# Patient Record
Sex: Female | Born: 1965
Health system: Southern US, Community
[De-identification: ages and names within clinical notes are randomized; demographics above are authoritative.]

## PROBLEM LIST (undated history)

## (undated) DIAGNOSIS — R569 Unspecified convulsions: Secondary | ICD-10-CM

## (undated) DIAGNOSIS — D649 Anemia, unspecified: Secondary | ICD-10-CM

## (undated) DIAGNOSIS — R002 Palpitations: Secondary | ICD-10-CM

## (undated) DIAGNOSIS — R519 Headache, unspecified: Secondary | ICD-10-CM

## (undated) DIAGNOSIS — R079 Chest pain, unspecified: Secondary | ICD-10-CM

## (undated) DIAGNOSIS — R0609 Other forms of dyspnea: Secondary | ICD-10-CM

## (undated) DIAGNOSIS — M1990 Unspecified osteoarthritis, unspecified site: Secondary | ICD-10-CM

## (undated) DIAGNOSIS — Z9889 Other specified postprocedural states: Secondary | ICD-10-CM

## (undated) DIAGNOSIS — J40 Bronchitis, not specified as acute or chronic: Secondary | ICD-10-CM

## (undated) DIAGNOSIS — I5189 Other ill-defined heart diseases: Secondary | ICD-10-CM

## (undated) DIAGNOSIS — F419 Anxiety disorder, unspecified: Secondary | ICD-10-CM

## (undated) DIAGNOSIS — R112 Nausea with vomiting, unspecified: Secondary | ICD-10-CM

## (undated) DIAGNOSIS — T7840XA Allergy, unspecified, initial encounter: Secondary | ICD-10-CM

## (undated) DIAGNOSIS — F329 Major depressive disorder, single episode, unspecified: Secondary | ICD-10-CM

## (undated) DIAGNOSIS — R0600 Dyspnea, unspecified: Secondary | ICD-10-CM

## (undated) DIAGNOSIS — F32A Depression, unspecified: Secondary | ICD-10-CM

## (undated) DIAGNOSIS — C801 Malignant (primary) neoplasm, unspecified: Secondary | ICD-10-CM

## (undated) DIAGNOSIS — K219 Gastro-esophageal reflux disease without esophagitis: Secondary | ICD-10-CM

## (undated) DIAGNOSIS — I639 Cerebral infarction, unspecified: Secondary | ICD-10-CM

## (undated) DIAGNOSIS — I10 Essential (primary) hypertension: Secondary | ICD-10-CM

## (undated) DIAGNOSIS — J449 Chronic obstructive pulmonary disease, unspecified: Secondary | ICD-10-CM

## (undated) DIAGNOSIS — R51 Headache: Secondary | ICD-10-CM

## (undated) DIAGNOSIS — E785 Hyperlipidemia, unspecified: Secondary | ICD-10-CM

## (undated) DIAGNOSIS — M797 Fibromyalgia: Secondary | ICD-10-CM

## (undated) HISTORY — PX: NOSE SURGERY: SHX723

## (undated) HISTORY — PX: ANTERIOR CRUCIATE LIGAMENT REPAIR: SHX115

## (undated) HISTORY — PX: TUBAL LIGATION: SHX77

## (undated) HISTORY — DX: Chest pain, unspecified: R07.9

## (undated) HISTORY — DX: Cerebral infarction, unspecified: I63.9

## (undated) HISTORY — DX: Dyspnea, unspecified: R06.00

## (undated) HISTORY — DX: Unspecified osteoarthritis, unspecified site: M19.90

## (undated) HISTORY — PX: CHOLECYSTECTOMY: SHX55

## (undated) HISTORY — PX: KNEE SURGERY: SHX244

## (undated) HISTORY — DX: Allergy, unspecified, initial encounter: T78.40XA

## (undated) HISTORY — PX: POLYPECTOMY: SHX149

## (undated) HISTORY — DX: Malignant (primary) neoplasm, unspecified: C80.1

## (undated) HISTORY — PX: CARDIAC CATHETERIZATION: SHX172

## (undated) HISTORY — DX: Palpitations: R00.2

## (undated) HISTORY — DX: Other ill-defined heart diseases: I51.89

## (undated) HISTORY — DX: Other forms of dyspnea: R06.09

## (undated) HISTORY — DX: Unspecified convulsions: R56.9

## (undated) HISTORY — PX: CORONARY ANGIOPLASTY: SHX604

## (undated) HISTORY — DX: Hyperlipidemia, unspecified: E78.5

---

## 2003-11-21 ENCOUNTER — Other Ambulatory Visit: Payer: Self-pay

## 2005-01-09 ENCOUNTER — Emergency Department: Payer: Self-pay | Admitting: Emergency Medicine

## 2005-03-19 ENCOUNTER — Ambulatory Visit: Payer: Self-pay | Admitting: Otolaryngology

## 2006-05-22 ENCOUNTER — Ambulatory Visit: Payer: Self-pay

## 2007-06-19 ENCOUNTER — Inpatient Hospital Stay: Payer: Self-pay | Admitting: General Surgery

## 2009-06-30 ENCOUNTER — Emergency Department: Payer: Self-pay | Admitting: Emergency Medicine

## 2009-10-20 ENCOUNTER — Emergency Department: Payer: Self-pay | Admitting: Unknown Physician Specialty

## 2010-02-01 ENCOUNTER — Ambulatory Visit: Payer: Self-pay | Admitting: Unknown Physician Specialty

## 2010-02-07 ENCOUNTER — Ambulatory Visit: Payer: Self-pay | Admitting: Unknown Physician Specialty

## 2010-12-05 ENCOUNTER — Emergency Department: Payer: Self-pay | Admitting: Emergency Medicine

## 2010-12-31 ENCOUNTER — Emergency Department: Payer: Self-pay | Admitting: Emergency Medicine

## 2011-09-03 ENCOUNTER — Emergency Department: Payer: Self-pay | Admitting: Emergency Medicine

## 2012-11-21 ENCOUNTER — Emergency Department: Payer: Self-pay | Admitting: Emergency Medicine

## 2013-06-19 ENCOUNTER — Emergency Department: Payer: Self-pay | Admitting: Emergency Medicine

## 2013-07-30 ENCOUNTER — Ambulatory Visit: Payer: Self-pay | Admitting: Internal Medicine

## 2013-11-07 ENCOUNTER — Emergency Department: Payer: Self-pay | Admitting: Emergency Medicine

## 2013-11-07 LAB — CBC
HGB: 14 g/dL (ref 12.0–16.0)
MCHC: 33.8 g/dL (ref 32.0–36.0)
MCV: 91 fL (ref 80–100)
RBC: 4.56 10*6/uL (ref 3.80–5.20)
RDW: 13.1 % (ref 11.5–14.5)

## 2013-11-07 LAB — TROPONIN I: Troponin-I: <0.02 ng/mL

## 2013-11-07 LAB — BASIC METABOLIC PANEL
Chloride: 105 mmol/L (ref 98–107)
EGFR (African American): >60
EGFR (Non-African Amer.): >60
Glucose: 105 mg/dL — ABNORMAL HIGH (ref 65–99)
Potassium: 3.8 mmol/L (ref 3.5–5.1)
Sodium: 138 mmol/L (ref 136–145)

## 2013-11-07 LAB — URINALYSIS, COMPLETE
Blood: NEGATIVE
Glucose,UR: NEGATIVE mg/dL (ref 0–75)
Ketone: NEGATIVE
Nitrite: NEGATIVE
Ph: 5 (ref 4.5–8.0)
Protein: NEGATIVE
Specific Gravity: 1.018 (ref 1.003–1.030)

## 2013-11-07 LAB — PRO B NATRIURETIC PEPTIDE: B-Type Natriuretic Peptide: 54 pg/mL (ref 0–125)

## 2014-01-07 ENCOUNTER — Emergency Department: Payer: Self-pay | Admitting: Emergency Medicine

## 2014-03-05 ENCOUNTER — Emergency Department: Payer: Self-pay | Admitting: Emergency Medicine

## 2014-05-12 DIAGNOSIS — F32A Depression, unspecified: Secondary | ICD-10-CM | POA: Insufficient documentation

## 2014-05-12 DIAGNOSIS — M545 Low back pain, unspecified: Secondary | ICD-10-CM | POA: Insufficient documentation

## 2014-05-12 DIAGNOSIS — G894 Chronic pain syndrome: Secondary | ICD-10-CM | POA: Insufficient documentation

## 2015-01-23 ENCOUNTER — Ambulatory Visit: Payer: Self-pay | Admitting: Family Medicine

## 2015-02-06 LAB — CBC
HCT: 39.4 % (ref 35.0–47.0)
HGB: 13 g/dL (ref 12.0–16.0)
MCH: 30 pg (ref 26.0–34.0)
MCHC: 33 g/dL (ref 32.0–36.0)
MCV: 91 fL (ref 80–100)
Platelet: 221 10*3/uL (ref 150–440)
RBC: 4.34 10*6/uL (ref 3.80–5.20)
RDW: 14 % (ref 11.5–14.5)
WBC: 11.2 10*3/uL — AB (ref 3.6–11.0)

## 2015-02-06 LAB — BASIC METABOLIC PANEL
Anion Gap: 11 (ref 7–16)
BUN: 14 mg/dL
CREATININE: 0.73 mg/dL
Calcium, Total: 8.9 mg/dL
Chloride: 103 mmol/L
Co2: 22 mmol/L
EGFR (African American): >60
EGFR (Non-African Amer.): >60
GLUCOSE: 136 mg/dL — AB
Potassium: 3.1 mmol/L — ABNORMAL LOW
Sodium: 136 mmol/L

## 2015-02-06 LAB — PRO B NATRIURETIC PEPTIDE: B-Type Natriuretic Peptide: 10 pg/mL

## 2015-02-06 LAB — D-DIMER(ARMC): D-Dimer: 1463 ng/ml

## 2015-02-06 LAB — TROPONIN I

## 2015-02-07 ENCOUNTER — Observation Stay
Admit: 2015-02-07 | Disposition: A | Discharge status: Home / Self Care | Payer: Self-pay | Attending: Internal Medicine | Admitting: Internal Medicine

## 2015-02-07 LAB — URINALYSIS, COMPLETE
BACTERIA: NONE SEEN
RBC,UR: 3406 /HPF (ref 0–5)
Specific Gravity: 1.022 (ref 1.003–1.030)
WBC UR: 98 /HPF (ref 0–5)

## 2015-02-07 LAB — DRUG SCREEN, URINE
Amphetamines, Ur Screen: NEGATIVE
Barbiturates, Ur Screen: NEGATIVE
Benzodiazepine, Ur Scrn: POSITIVE
CANNABINOID 50 NG, UR ~~LOC~~: NEGATIVE
Cocaine Metabolite,Ur ~~LOC~~: NEGATIVE
MDMA (Ecstasy)Ur Screen: NEGATIVE
METHADONE, UR SCREEN: NEGATIVE
OPIATE, UR SCREEN: NEGATIVE
PHENCYCLIDINE (PCP) UR S: NEGATIVE
Tricyclic, Ur Screen: POSITIVE

## 2015-02-12 LAB — CULTURE, BLOOD (SINGLE)

## 2015-02-16 ENCOUNTER — Institutional Professional Consult (permissible substitution): Payer: Self-pay | Admitting: Internal Medicine

## 2015-02-16 ENCOUNTER — Encounter: Payer: Self-pay | Admitting: *Deleted

## 2015-02-22 ENCOUNTER — Ambulatory Visit (INDEPENDENT_AMBULATORY_CARE_PROVIDER_SITE_OTHER): Payer: No Typology Code available for payment source | Admitting: Internal Medicine

## 2015-02-22 ENCOUNTER — Encounter: Payer: Self-pay | Admitting: Internal Medicine

## 2015-02-22 VITALS — BP 122/84 | HR 94 | Ht 62.0 in | Wt 191.0 lb

## 2015-02-22 DIAGNOSIS — R0602 Shortness of breath: Secondary | ICD-10-CM | POA: Insufficient documentation

## 2015-02-22 DIAGNOSIS — R0600 Dyspnea, unspecified: Secondary | ICD-10-CM

## 2015-02-22 DIAGNOSIS — R059 Cough, unspecified: Secondary | ICD-10-CM | POA: Insufficient documentation

## 2015-02-22 DIAGNOSIS — R05 Cough: Secondary | ICD-10-CM

## 2015-02-22 DIAGNOSIS — J069 Acute upper respiratory infection, unspecified: Secondary | ICD-10-CM | POA: Diagnosis not present

## 2015-02-22 MED ORDER — AMOXICILLIN-POT CLAVULANATE 875-125 MG PO TABS: 1.0000 | ORAL_TABLET | Freq: Two times a day (BID) | ORAL | Status: AC
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
## 2015-02-22 NOTE — Assessment & Plan Note (Addendum)
Multifactorial: Obesity, postinfectious, obstructive versus restrictive disease, possible asthma versus COPD, another upper respiratory tract infection, possible bronchospasms, possible allergies  Plan: -See plan for dyspnea -See plan for upper respiratory tract infection. -Flonase 2 sprays to each nostril twice a day 1 week, then only as needed for postnasal drip, runny nose, nasal congestion.

## 2015-02-22 NOTE — Patient Instructions (Addendum)
Follow up with Dr. Stevenson Clinch in 2 months - pulmonary function testing and 6 minute walk test prior to follow up - Augmentin 875mg  - 1 tab by mouth twice a day x 7 days.  - stop spiriva  - continue with Advair for now until follow up - gargle and rinse after each use - Flonase - 2 sprays to each nostril daily for 7 days

## 2015-02-22 NOTE — Assessment & Plan Note (Addendum)
Multifactorial: Recent infection, obesity, obstructive versus restrictive disease, deconditioning, another upper respiratory tract infection, possible asthma versus COPD  Patient's dyspnea is very subjective, O2 saturation in the office was noted to be at 94%. She does have some generalized upper extremity and lower extremity swelling, which is nonpitting. Her BNP level of the hospitalization was less than 100. I believe that her dyspnea is subjectively graded, however pulmonary function testing along with 6 minute walk testing and further evaluation by cardiology may give a better idea as to etiology for her dyspnea.  I do not believe the patient has COPD, at least clinically speaking. Prior to hospitalization and prior to December 2015 she is a never smoker, although she has significant secondhand smoke exposure, but no recurrent ED visits or urgent care visits for upper spray tract infection or seasonal allergies or morning cough. Call most recent CT chest did not show any pulmonary embolism, and I could not appreciate any frank emphysema or airway disease on this low quality CAT scan. There was some mild peribronchial thickening on her recent CAT scan, however this could've been due to recent infection.  Plan: -Pulmonary function testing and 6 minute walk test prior to follow-up visit -Stop Spiriva, continue with Advair as needed albuterol. -Will follow up with cardiology evaluation for tachycardia and dyspnea, possible echocardiogram -It had follow-up patient continues to have dyspnea and cough may need to consider a high-resolution CAT scan at that time

## 2015-02-22 NOTE — Assessment & Plan Note (Addendum)
Cough with congestion and nasal drainage. Cough with productive sputum - brownish  Plan - Augmentin 875mg  - 1 tab by mouth twice a day x 7 days.  - Flonase - 2 sprays to each nostril daily for 7 days

## 2015-03-03 ENCOUNTER — Encounter: Payer: Self-pay | Admitting: Cardiovascular Disease

## 2015-03-06 ENCOUNTER — Inpatient Hospital Stay: Payer: Self-pay | Admitting: Internal Medicine
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
## 2015-03-21 ENCOUNTER — Ambulatory Visit (INDEPENDENT_AMBULATORY_CARE_PROVIDER_SITE_OTHER): Payer: No Typology Code available for payment source | Admitting: Cardiovascular Disease

## 2015-03-21 ENCOUNTER — Encounter: Payer: Self-pay | Admitting: Cardiovascular Disease

## 2015-03-21 VITALS — BP 112/72 | HR 67 | Ht 62.0 in | Wt 190.5 lb

## 2015-03-21 DIAGNOSIS — J4540 Moderate persistent asthma, uncomplicated: Secondary | ICD-10-CM

## 2015-03-21 DIAGNOSIS — R079 Chest pain, unspecified: Secondary | ICD-10-CM

## 2015-03-21 DIAGNOSIS — R059 Cough, unspecified: Secondary | ICD-10-CM

## 2015-03-21 DIAGNOSIS — J069 Acute upper respiratory infection, unspecified: Secondary | ICD-10-CM

## 2015-03-21 DIAGNOSIS — R05 Cough: Secondary | ICD-10-CM

## 2015-03-21 DIAGNOSIS — J45909 Unspecified asthma, uncomplicated: Secondary | ICD-10-CM | POA: Insufficient documentation

## 2015-03-21 DIAGNOSIS — R0600 Dyspnea, unspecified: Secondary | ICD-10-CM | POA: Diagnosis not present

## 2015-03-21 MED ORDER — FUROSEMIDE 20 MG PO TABS: 20.0000 mg | ORAL_TABLET | Freq: Two times a day (BID) | ORAL | Status: DC | PRN

## 2015-03-21 MED ORDER — POTASSIUM CHLORIDE ER 10 MEQ PO TBCR: 10.0000 meq | EXTENDED_RELEASE_TABLET | Freq: Every day | ORAL | Status: DC

## 2015-03-21 NOTE — Assessment & Plan Note (Signed)
Diffuse rhonchorous breath sounds bilaterally from apical regions down to the bases. No productive sputum. She has had 3 courses of antibiotics.  Given the severity of her symptoms and diffuse coarse breath sounds on exam, I suspect she might need a long prednisone taper. Unclear if she would benefit from a fourth course of antibiotics such as Levaquin for prolonged period of time. Both of these were discussed with her. No prescriptions called in at this time as she would like to try high-dose diuretics for several days

## 2015-03-21 NOTE — Assessment & Plan Note (Signed)
I think her presentation is probably secondary to underlying reactive airway disease. Very coarse breath sounds and bronchospastic cough on exam. Echocardiogram has been ordered to rule out pulmonary hypertension. She will try several days of high-dose diuretics with less fluid intake. I suspect arm and leg swelling is unrelated to a cardiac issue. If echo was essentially normal, and no significant improvement with higher dose diuretics, she may benefit from prolonged course of prednisone with slow taper

## 2015-03-21 NOTE — Patient Instructions (Addendum)
You are doing well.  Please try extra lasix daily for a few days with less fluid intake  Take with a potassium pill  We will order an echocardiogram for shortness of breath  I will talk with Dr. Stevenson Clinch about your lungs  Please call us if you have new issues that need to be addressed before your next appt.

## 2015-03-21 NOTE — Progress Notes (Signed)
Patient ID: Melinda Shaw, female    DOB: 26-Jul-1966, 49 y.o.   MRN: [id number]  HPI Comments: Melinda Shaw is a 49 year old woman with no prior smoking history though does have secondhand exposure to smoke, history of anxiety/depression with PTSD, fibromyalgia, obesity, Developing upper respiratory infection at the end of 2015, started on antibiotics January 2016 ( Augmentin per the patient), treated with another course of antibiotics as an outpatient, admitted to the hospital end of March 2016 with severe shortness of breath diagnosed with acute bronchitis, underlying COPD, tachycardia,  Treated with ceftin for 2 days while in the hospital plus additional 5 days at home. She was discharged from the hospital 02/08/2015. EKG in the hospital showed sinus tachycardia with rate 135 bpm, nonspecific ST abnormality She was started on diltiazem 120 mg daily while in the hospital for heart rate control Presenting with continued shortness of breath.  In follow-up today, she reports having chronic cough, nonproductive. Significant cough at nighttime that keeps her awake, improved with Robitussin with codeine. She does have swelling in her arms and legs and has been taking Lasix 20 mg daily. She's been on Lasix for at least one year She reports no significant improvement in her cough and symptoms over the past 4-5 months. She has significant shortness of breath with her ADLs. No dramatic improvement in the past month.   CT scan 02/06/2015 showing no PE, peribronchial thickening Review of her lab work with her shows low potassium 3.1 on 02/06/2015. She is normally not on potassium as an outpatient BNP of 10 Other recent lab work from primary care March 2016 shows total cholesterol 253, LDL 173, TSH 2.0   EKG on today's visit shows normal sinus rhythm with rate 67 bpm, no significant ST or T-wave changes    Allergies  Allergen Reactions  . Cherry Swelling    Swelling of lips and throat  . Sulfa  Antibiotics Shortness Of Breath    Stopped breathing, Swelling of throat  . Bee Venom     Swelling of throat    Current Outpatient Prescriptions on File Prior to Visit  Medication Sig Dispense Refill  . albuterol (PROVENTIL HFA;VENTOLIN HFA) 108 (90 BASE) MCG/ACT inhaler Inhale 2 puffs into the lungs every 6 (six) hours as needed for wheezing or shortness of breath.    . citalopram (CELEXA) 20 MG tablet Take 20 mg by mouth daily.    . clonazePAM (KLONOPIN) 1 MG tablet Take 1 mg by mouth 3 (three) times daily.    . Fluticasone-Salmeterol (ADVAIR) 250-50 MCG/DOSE AEPB Inhale 1 puff into the lungs 2 (two) times daily.    [name] gabapentin (NEURONTIN) 100 MG capsule Take 200 mg by mouth daily.    . QUEtiapine (SEROQUEL XR) 300 MG 24 hr tablet Take 300 mg by mouth at bedtime.    [name] tiotropium (SPIRIVA) 18 MCG inhalation capsule Place 18 mcg into inhaler and inhale daily.    [name] diltiazem (TIAZAC) 120 MG 24 hr capsule Take 120 mg by mouth daily.    [name] tiZANidine (ZANAFLEX) 4 MG tablet Take 4 mg by mouth every 8 (eight) hours as needed for muscle spasms.     No current facility-administered medications on file prior to visit.    Past Medical History  Diagnosis Date  . Asthma   . Hyperlipidemia     Past Surgical History  Procedure Laterality Date  . Anterior cruciate ligament repair    . Cholecystectomy    . Knee surgery  left knee   . Tubal ligation      Social History  reports that she has never smoked. She does not have any smokeless tobacco history on file. She reports that she does not drink alcohol or use illicit drugs.  Family History family history includes AAA (abdominal aortic aneurysm) in her sister; COPD in her mother and sister; Heart Problems in her mother; High Cholesterol in her mother and sister; Hypertension in her father.   Review of Systems  Constitutional: Negative.   Eyes: Negative.   Respiratory: Positive for cough and shortness of breath.   Cardiovascular:  Positive for leg swelling.  Gastrointestinal: Negative.   Musculoskeletal: Negative.   Skin: Negative.   Neurological: Negative.   Hematological: Negative.   Psychiatric/Behavioral: Negative.     BP 112/72 mmHg  Pulse 67  Ht 5\' 2"  (1.575 m)  Wt 190 lb 8 oz (86.41 kg)  BMI 34.83 kg/m2  Physical Exam  Constitutional: She is oriented to person, place, and time. She appears well-developed and well-nourished.  HENT:  Head: Normocephalic.  Nose: Nose normal.  Mouth/Throat: Oropharynx is clear and moist.  Eyes: Conjunctivae are normal. Pupils are equal, round, and reactive to light.  Neck: Normal range of motion. Neck supple. No JVD present.  Cardiovascular: Normal rate, regular rhythm, S1 normal, S2 normal, normal heart sounds and intact distal pulses.  Exam reveals no gallop and no friction rub.   No murmur heard. No significant pitting edema  Pulmonary/Chest: Effort normal. No respiratory distress. She has no wheezes. She has rales. She exhibits no tenderness.  Diffuse rhonchorous breath sounds bilaterally from the base to apical regions. Also appreciated anteriorly  Abdominal: Soft. Bowel sounds are normal. She exhibits no distension. There is no tenderness.  Musculoskeletal: Normal range of motion. She exhibits no edema or tenderness.  Lymphadenopathy:    She has no cervical adenopathy.  Neurological: She is alert and oriented to person, place, and time. Coordination normal.  Skin: Skin is warm and dry. No rash noted. No erythema.  Psychiatric: She has a normal mood and affect. Her behavior is normal. Judgment and thought content normal.    Assessment and Plan  Nursing note and vitals reviewed.

## 2015-03-21 NOTE — Assessment & Plan Note (Signed)
Currently with no productive sputum. She does continue to have bronchospasm and very coarse breath sounds. No improvement so far with Advair and albuterol

## 2015-03-21 NOTE — Assessment & Plan Note (Signed)
She has continued cough dating back to end of 2015. Improvement of her symptoms with guaifenesin with codeine

## 2015-03-23 ENCOUNTER — Encounter: Payer: No Typology Code available for payment source | Admitting: Cardiovascular Disease

## 2015-03-24 ENCOUNTER — Telehealth: Payer: Self-pay | Admitting: *Deleted

## 2015-03-24 NOTE — Telephone Encounter (Signed)
Can we call to verify what the other medications are If she talking about prednisone and Levaquin? Does she still have significant shortness of breath, resolving bronchitis?

## 2015-03-24 NOTE — Telephone Encounter (Signed)
Pt is asking if we can send in her a prescription for simsvastain, and there were two more medication. She is not sure of the names  She said doctor Rockey Situ would know which ones, said in her last visit that he said if she was still not feeling well he would note it and we would just send them in She would like the [facility] [place].  Please call patient. She would like to know when this is done.

## 2015-03-27 NOTE — Telephone Encounter (Signed)
Spoke w/ pt.  She reports that she is feeling a little better each day, but is still c/o SOB. She states that she is not sure of the name of the meds she is requesting, but "prednisone & Levaquin sound right". She states that while she was being examined, Dr. Rockey Situ listened to her lungs and told her that "I just came from the hospital and I didn't hear anything over there that sounded this bad".  She reports that she has taken prednisone in the past and is agreeable to taking this again if needed, but wants something "to break this mess up".

## 2015-03-27 NOTE — Telephone Encounter (Signed)
Left message for pt to call back  °

## 2015-03-28 ENCOUNTER — Ambulatory Visit: Payer: No Typology Code available for payment source

## 2015-03-28 ENCOUNTER — Encounter (INDEPENDENT_AMBULATORY_CARE_PROVIDER_SITE_OTHER): Payer: Self-pay

## 2015-03-28 ENCOUNTER — Encounter: Payer: No Typology Code available for payment source | Admitting: Pharmacist

## 2015-03-29 MED ORDER — FUROSEMIDE 20 MG PO TABS: 20.0000 mg | ORAL_TABLET | Freq: Two times a day (BID) | ORAL | Status: DC | PRN

## 2015-03-29 MED ORDER — PREDNISONE 10 MG PO TABS: 10.0000 mg | ORAL_TABLET | ORAL | Status: DC

## 2015-03-29 NOTE — Telephone Encounter (Signed)
Would restart prednisone taper 40 mg for 1 week 30 mg for 1 week 20 mg for 1 week 10 mg for 1 week Needs follow-up with pulmonary after that

## 2015-03-29 NOTE — Telephone Encounter (Signed)
Levaquin 500 mg daily for 10 days.

## 2015-03-29 NOTE — Telephone Encounter (Signed)
Spoke w/ pt.  Advised her of Dr. Donivan Scull recommendation.  She is appreciative, but states that she would like an antibiotic called in, as well.  She is sched to see pulmonology on 6/13. Reports that she is still coughing, but it is not productive, as she doesn't feel that she is able to cough it all the way up.  Please advise.  Thank you.

## 2015-03-30 MED ORDER — LEVOFLOXACIN 500 MG PO TABS: 500.0000 mg | ORAL_TABLET | Freq: Every day | ORAL | Status: DC

## 2015-03-30 NOTE — Telephone Encounter (Signed)
Left message on pt's vm w/ Dr. Donivan Scull recommendations.  Asked her to call back w/ any questions or concerns.

## 2015-04-14 DIAGNOSIS — R079 Chest pain, unspecified: Secondary | ICD-10-CM

## 2015-04-14 DIAGNOSIS — R0600 Dyspnea, unspecified: Secondary | ICD-10-CM | POA: Diagnosis not present

## 2015-04-17 ENCOUNTER — Ambulatory Visit (INDEPENDENT_AMBULATORY_CARE_PROVIDER_SITE_OTHER): Payer: No Typology Code available for payment source

## 2015-04-17 ENCOUNTER — Other Ambulatory Visit: Payer: Self-pay

## 2015-04-17 DIAGNOSIS — R0600 Dyspnea, unspecified: Secondary | ICD-10-CM

## 2015-04-17 DIAGNOSIS — R079 Chest pain, unspecified: Secondary | ICD-10-CM

## 2015-04-24 ENCOUNTER — Telehealth: Payer: Self-pay

## 2015-04-24 NOTE — Telephone Encounter (Signed)
Request from [place]. [place] , sent to [facility] on 04/25/2015 .

## 2015-04-25 ENCOUNTER — Ambulatory Visit (INDEPENDENT_AMBULATORY_CARE_PROVIDER_SITE_OTHER): Payer: No Typology Code available for payment source | Admitting: Internal Medicine

## 2015-04-25 ENCOUNTER — Encounter: Payer: Self-pay | Admitting: Internal Medicine

## 2015-04-25 VITALS — BP 112/74 | HR 90 | Ht 62.0 in | Wt 191.0 lb

## 2015-04-25 DIAGNOSIS — R05 Cough: Secondary | ICD-10-CM

## 2015-04-25 DIAGNOSIS — R0600 Dyspnea, unspecified: Secondary | ICD-10-CM | POA: Diagnosis not present

## 2015-04-25 DIAGNOSIS — R059 Cough, unspecified: Secondary | ICD-10-CM

## 2015-04-25 LAB — PULMONARY FUNCTION TEST
DL/VA % pred: 98 %
DL/VA: 4.47 ml/min/mmHg/L
DLCO unc % pred: 111 %
DLCO unc: 24.06 ml/min/mmHg
FEF 25-75 POST: 2.34 L/s
FEF 25-75 Pre: 2.28 L/sec
FEF2575-%CHANGE-POST: 2 %
FEF2575-%PRED-PRE: 85 %
FEF2575-%Pred-Post: 87 %
FEV1-%CHANGE-POST: 2 %
FEV1-%PRED-PRE: 84 %
FEV1-%Pred-Post: 86 %
FEV1-PRE: 2.23 L
FEV1-Post: 2.28 L
FEV1FVC-%CHANGE-POST: 2 %
FEV1FVC-%PRED-PRE: 101 %
FEV6-%CHANGE-POST: 0 %
FEV6-%PRED-POST: 85 %
FEV6-%Pred-Pre: 85 %
FEV6-Post: 2.75 L
FEV6-Pre: 2.76 L
FEV6FVC-%Pred-Post: 102 %
FEV6FVC-%Pred-Pre: 102 %
FVC-%Change-Post: 0 %
FVC-%PRED-POST: 83 %
FVC-%Pred-Pre: 83 %
FVC-Post: 2.76 L
FVC-Pre: 2.76 L
POST FEV1/FVC RATIO: 83 %
POST FEV6/FVC RATIO: 100 %
Pre FEV1/FVC ratio: 81 %
Pre FEV6/FVC Ratio: 100 %

## 2015-04-25 NOTE — Patient Instructions (Signed)
Follow up with Dr. Stevenson Clinch in 1 month - HRCT of Chest with contrast for restrictive process and chronic cough  - CT Sinuses for nasal congestion and chronic cough - resume Advair 1 puff twice a day - gargle and rinse after each use, if cough return then stop Advair.  - may try netti pot - follow directions on packaging.

## 2015-04-25 NOTE — Assessment & Plan Note (Addendum)
Multifactorial: Obesity, postinfectious, obstructive versus restrictive disease, possible asthma versus COPD, another upper respiratory tract infection, possible bronchospasms, possible allergies, ? Nasal congestion  I believe the patient may have a component of care hyperreactivity, stemming from her last respiratory infection. Today, however, she does have nasal congestion which was noted her last visit also concerned that she may also do be developing sinusitis could be acute on chronic sinusitis. Will further evaluate with CT of the sinuses  Plan: -See plan for dyspnea Continue with Flonase -CT of sinuses with contrast prior to follow-up given chronic congestion, chronic cough, shortness of breath. -

## 2015-04-25 NOTE — Assessment & Plan Note (Signed)
Multifactorial: Possible chronic sinus congestion, obesity, deconditioning, possible underlying restrictive disease/ILD  At this time believe that her dyspnea is related to deconditioning and recurrent bouts of cough and URIs. Her echocardiogram is essentially normal. Given her chronic cough and reduction in her RV and TLC on primary [place] in testing believe a high-resolution CAT scan is warranted.  I do not believe the patient has COPD, at least clinically speaking. Prior to hospitalization and prior to December 2015 she is a never smoker, although she has significant secondhand smoke exposure, but no recurrent ED visits or urgent care visits for upper spray tract infection or seasonal allergies or morning cough. On her most recent CT chest did not show any pulmonary embolism, and I could not appreciate any frank emphysema or airway disease on this low quality CAT scan. There was some mild peribronchial thickening on her last CAT scan, however this could've been due to recent infection.  Plan: -PFTs with mild to moderate injection, warranting high-resolution CAT scan with contrast given chronic cough dyspnea and clinical status. -Continue with Advair as tolerated, if coughing increases may stop Advair.

## 2015-04-25 NOTE — Progress Notes (Signed)
SMW performed today. 

## 2015-04-25 NOTE — Progress Notes (Signed)
PFT performed today. 

## 2015-04-25 NOTE — Progress Notes (Signed)
MRN# [id number] Melinda Shaw [birth year]   CC: Chief Complaint  Patient presents with  . Follow-up    SMW/PFT results/ SOB w/activity      Brief History:  02/22/15 HPI:  Patient is a pleasant 49 year old female presents today for hospital visit follow-up for suspected COPD, with exacerbation. She is accompanied with her husband today. Recent hospitalization at [facility] for shortness of breath and sinus tachycardia, see below for full details. Briefly speaking patient was having shortness of breath for the past 3 months it eventually got to the point where she was feeling social breath that she came to the ED, prior to that she had 2 rounds of antibiotics and steroids with her primary care physician, but she was not getting any better. Upon arriving to the ED she was noted to have paroxysmal cough and then had sinus tachycardia, she had a CT a PE protocol that was negative for PE but did show some mild peribronchial thickening. She was admitted to the hospital for 2 days and its treated for suspected COPD exacerbation. She is a never smoker, she was exposed to secondhand smoke from her family (father and siblings). Her husband is a nonsmoker. She is treated overnight in the hospital and discharged with cardiology and pulmonary follow-up for dyspnea. Patient tells me that in December she had a bad virus upper respiratory tract infection, she was since then she has been treated with 2 rounds of antibiotics and steroids by her primary care physician. Today she endorses inductive cough, brownish sputum, fatigue, shortness of breath at rest, shortness of breath with exertion. She also states that she has some mild leg swelling, but does not endorse fluid retention. At hospital discharge she was given inhalers for Advair, Spiriva, and albuterol. Prior to hospitalization and prior to December 2015 she did not have any yearly URIs, seasonal allergies, urgent care/ED visits for breathing problems. She has  pets at home, dogs and cats. She has to roll stenosis in her backyard, but states that she has not been visiting them frequently. During office visit today she did have some coughing, which sounded mostly upper airway congestion. Plan - cont with advair, pfts, 83mwt, ECHO at cards f\u, augmentin. flonase  Events since last clinic visit:  She presents today along with her husband for further evaluation of chronic cough. At her last visit she was seen as a hospital follow-up for suspected pneumonia, bronchitis, and chronic cough. At her last visit Spiriva was stopped, Advair was continued. Since her last visit she's continued to have chronic cough, unknown triggers, she is follow-up with cardiology, who has obtained an echo, which was essentially normal. Today she also had blurry function testing and 6 minute walk testing done. Patient states that she has intermittent episodes of nasal congestion, coughing interrupts her daily activities, sometimes at night coughing will wake her up or prevent her from going to sleep. Cough is dry, patient feels like she has a "rattling in my chest" but she is unable to produce productive sputum. Over the last 3 days she said using Advair has made her cough worse and she has stopped since 3 days ago. She's also been using pro-air 3 times daily. She states that her nasal congestion is intermittent at times, feeling of a drainage, sometimes she feels there is drainage in the back of her throat. She saw ENT about 10 years ago for reconstructive nasal surgery after an accidental fall where she broke her nose. She also states the Flonase is not  helping. She states that the coughing does make her shortness of breath and in the last week or 2 she's had multiple falls from feeling short of breath.    Medication:   Current Outpatient Rx  Name  Route  Sig  Dispense  Refill  . albuterol (PROVENTIL HFA;VENTOLIN HFA) 108 (90 BASE) MCG/ACT inhaler   Inhalation   Inhale 2  puffs into the lungs every 6 (six) hours as needed for wheezing or shortness of breath.         . citalopram (CELEXA) 20 MG tablet   Oral   Take 20 mg by mouth daily.         . clonazePAM (KLONOPIN) 1 MG tablet   Oral   Take 1 mg by mouth 3 (three) times daily.         [name] diltiazem (TIAZAC) 120 MG 24 hr capsule   Oral   Take 120 mg by mouth daily.         [name] eletriptan (RELPAX) 20 MG tablet   Oral   Take 20 mg by mouth every 2 (two) hours as needed.          . Fluticasone-Salmeterol (ADVAIR) 250-50 MCG/DOSE AEPB   Inhalation   Inhale 1 puff into the lungs 2 (two) times daily.         . furosemide (LASIX) 20 MG tablet   Oral   Take 1 tablet (20 mg total) by mouth 2 (two) times daily as needed.   180 tablet   4   . gabapentin (NEURONTIN) 100 MG capsule   Oral   Take 200 mg by mouth daily.         [name] HYDROcodone-homatropine (HYCODAN) 5-1.5 MG/5ML syrup   Oral   Take 5 mLs by mouth at bedtime.         . potassium chloride (K-DUR) 10 MEQ tablet   Oral   Take 1 tablet (10 mEq total) by mouth daily.   30 tablet   6   . QUEtiapine (SEROQUEL XR) 300 MG 24 hr tablet   Oral   Take 300 mg by mouth at bedtime.         [name] tiotropium (SPIRIVA) 18 MCG inhalation capsule   Inhalation   Place 18 mcg into inhaler and inhale daily.         [name] tiZANidine (ZANAFLEX) 4 MG tablet   Oral   Take 4 mg by mouth every 8 (eight) hours as needed for muscle spasms.         [name] topiramate (TOPAMAX) 25 MG tablet   Oral   Take 25 mg by mouth daily.             Review of Systems: Gen:  Denies  fever, sweats, chills HEENT: Denies blurred vision, double vision, ear pain, eye pain, hearing loss, nose bleeds, sore throat Cvc:  No dizziness, chest pain or heaviness Resp:   Admits to: Nonproductive cough, nasal drainage, sinus congestion, short of breath. Gi: Denies swallowing difficulty, stomach pain, nausea or vomiting, diarrhea, constipation, bowel incontinence Gu:  Denies  bladder incontinence, burning urine Ext:   No Joint pain, stiffness or swelling Skin: No skin rash, easy bruising or bleeding or hives Endoc:  No polyuria, polydipsia , polyphagia or weight change Other:  All other systems negative  Allergies:  Cherry; Sulfa antibiotics; and Bee venom  Physical Examination:  VS: BP 112/74 mmHg  Pulse 90  Ht 5\' 2"  (1.575 m)  Wt 191 lb (86.637 kg)  BMI 34.93 kg/m2  SpO2 98%  General Appearance: No distress  HEENT: PERRLA, no ptosis, no other lesions noticed Pulmonary: Good respiratory effort, good upper airway sounds, mild coarse lower breath sounds, there is some mild respiratory wheezing at the bases. Cardiovascular:  Normal S1,S2.  No m/r/g.     Abdomen:Exam: Benign, Soft, non-tender, No masses  Skin:   warm, no rashes, no ecchymosis  Extremities: normal, no cyanosis, clubbing, warm with normal capillary refill.     Rad results: (The following images and results were reviewed by Dr. Stevenson Clinch). ECHO 04/17/15 Study Conclusions  - Left ventricle: The cavity size was normal. Wall thickness was normal. Systolic function was normal. The estimated ejection fraction was in the range of 60% to 65%. Wall motion was normal; there were no regional wall motion abnormalities. Left ventricular diastolic function parameters were normal.  Impressions: - Normal study.   Pulmonary function testing 04/25/2015 FVC 83% FEV1 84% FEV1/FVC 81% SVC 87% FRC 59% RV 66% TLC 77% RV/TLC 86% ERV 32% DLCO uncorrected 111% Impression: No significant obstructive process, normal FVC and FEV1. There is some reduction in the RV and TLC with moderate reduction, possible suggesting some mild hyperinflation with air trapping. There is some reduction in ERV. 6 minute walk test: 336 m/1102 feet, and end of the exam she did feel nauseous with some exertion. Lower saturation 98% highest heart rate 99. Assessment and Plan: 49 year old female seen in consultation for  chronic cough. Cough Multifactorial: Obesity, postinfectious, obstructive versus restrictive disease, possible asthma versus COPD, another upper respiratory tract infection, possible bronchospasms, possible allergies, ? Nasal congestion  I believe the patient may have a component of care hyperreactivity, stemming from her last respiratory infection. Today, however, she does have nasal congestion which was noted her last visit also concerned that she may also do be developing sinusitis could be acute on chronic sinusitis. Will further evaluate with CT of the sinuses  Plan: -See plan for dyspnea Continue with Flonase -CT of sinuses with contrast prior to follow-up given chronic congestion, chronic cough, shortness of breath. -     Dyspnea Multifactorial: Possible chronic sinus congestion, obesity, deconditioning, possible underlying restrictive disease/ILD  At this time believe that her dyspnea is related to deconditioning and recurrent bouts of cough and URIs. Her echocardiogram is essentially normal. Given her chronic cough and reduction in her RV and TLC on primary Funk in testing believe a high-resolution CAT scan is warranted.  I do not believe the patient has COPD, at least clinically speaking. Prior to hospitalization and prior to December 2015 she is a never smoker, although she has significant secondhand smoke exposure, but no recurrent ED visits or urgent care visits for upper spray tract infection or seasonal allergies or morning cough. On her most recent CT chest did not show any pulmonary embolism, and I could not appreciate any frank emphysema or airway disease on this low quality CAT scan. There was some mild peribronchial thickening on her last CAT scan, however this could've been due to recent infection.  Plan: -PFTs with mild to moderate injection, warranting high-resolution CAT scan with contrast given chronic cough dyspnea and clinical status. -Continue with Advair as  tolerated, if coughing increases may stop Advair.     Updated Medication List Outpatient Encounter Prescriptions as of 04/25/2015  Medication Sig  . albuterol (PROVENTIL HFA;VENTOLIN HFA) 108 (90 BASE) MCG/ACT inhaler Inhale 2 puffs into the lungs every 6 (six) hours as needed for wheezing or shortness of breath.  [name]  citalopram (CELEXA) 20 MG tablet Take 20 mg by mouth daily.  . clonazePAM (KLONOPIN) 1 MG tablet Take 1 mg by mouth 3 (three) times daily.  [name] diltiazem (TIAZAC) 120 MG 24 hr capsule Take 120 mg by mouth daily.  [name] eletriptan (RELPAX) 20 MG tablet Take 20 mg by mouth every 2 (two) hours as needed.   . Fluticasone-Salmeterol (ADVAIR) 250-50 MCG/DOSE AEPB Inhale 1 puff into the lungs 2 (two) times daily.  . furosemide (LASIX) 20 MG tablet Take 1 tablet (20 mg total) by mouth 2 (two) times daily as needed.  . gabapentin (NEURONTIN) 100 MG capsule Take 200 mg by mouth daily.  [name] HYDROcodone-homatropine (HYCODAN) 5-1.5 MG/5ML syrup Take 5 mLs by mouth at bedtime.  [name] levofloxacin (LEVAQUIN) 500 MG tablet Take 1 tablet (500 mg total) by mouth daily.  . potassium chloride (K-DUR) 10 MEQ tablet Take 1 tablet (10 mEq total) by mouth daily.  . predniSONE (DELTASONE) 10 MG tablet Take 1 tablet (10 mg total) by mouth as directed. 40 mg for 1 week, 30 mg for 1 week, 20 mg for 1 week, 10 mg for 1 week  . QUEtiapine (SEROQUEL XR) 300 MG 24 hr tablet Take 300 mg by mouth at bedtime.  [name] tiotropium (SPIRIVA) 18 MCG inhalation capsule Place 18 mcg into inhaler and inhale daily.  [name] tiZANidine (ZANAFLEX) 4 MG tablet Take 4 mg by mouth every 8 (eight) hours as needed for muscle spasms.  [name] topiramate (TOPAMAX) 25 MG tablet Take 25 mg by mouth daily.    No facility-administered encounter medications on file as of 04/25/2015.    Orders for this visit: Orders Placed This Encounter  Procedures  . CT Chest High Resolution    Standing Status: Future     Number of Occurrences:      Standing Expiration  Date: 06/24/2016    Scheduling Instructions:     Within 1 month    Order Specific Question:  Reason for Exam (SYMPTOM  OR DIAGNOSIS REQUIRED)    Answer:  restrictive process/chronic cough    Order Specific Question:  Is the patient pregnant?    Answer:  No    Order Specific Question:  Preferred imaging location?    Answer:  [facility]  . [facility]    Standing Status: Future     Number of Occurrences:      Standing Expiration Date: 07/25/2016    Order Specific Question:  Reason for Exam (SYMPTOM  OR DIAGNOSIS REQUIRED)    Answer:  nasal congeston/cough    Order Specific Question:  Is the patient pregnant?    Answer:  No    Order Specific Question:  Preferred imaging location?    Answer:  [facility]    Thank  you for the visitation and for allowing  [facility] to assist in the care of your patient. Our recommendations are noted above.  Please contact us if we can be of further service.  Vilinda Boehringer, MD [facility] Office Number: [phone]

## 2015-05-08 ENCOUNTER — Other Ambulatory Visit: Payer: Self-pay

## 2015-05-19 ENCOUNTER — Ambulatory Visit
Admission: RE | Admit: 2015-05-19 | Discharge: 2015-05-19 | Disposition: A | Discharge status: Home / Self Care | Payer: No Typology Code available for payment source | Source: Ambulatory Visit | Attending: Internal Medicine | Admitting: Internal Medicine

## 2015-05-19 DIAGNOSIS — R0600 Dyspnea, unspecified: Secondary | ICD-10-CM

## 2015-05-19 DIAGNOSIS — J342 Deviated nasal septum: Secondary | ICD-10-CM | POA: Insufficient documentation

## 2015-05-19 DIAGNOSIS — J320 Chronic maxillary sinusitis: Secondary | ICD-10-CM | POA: Insufficient documentation

## 2015-05-19 DIAGNOSIS — R059 Cough, unspecified: Secondary | ICD-10-CM

## 2015-05-19 DIAGNOSIS — R05 Cough: Secondary | ICD-10-CM

## 2015-05-23 ENCOUNTER — Ambulatory Visit (INDEPENDENT_AMBULATORY_CARE_PROVIDER_SITE_OTHER): Payer: No Typology Code available for payment source | Admitting: Internal Medicine

## 2015-05-23 ENCOUNTER — Encounter: Payer: Self-pay | Admitting: Internal Medicine

## 2015-05-23 VITALS — BP 100/74 | HR 114 | Temp 98.2°F | Ht 62.0 in | Wt 186.0 lb

## 2015-05-23 DIAGNOSIS — J329 Chronic sinusitis, unspecified: Secondary | ICD-10-CM | POA: Diagnosis not present

## 2015-05-23 DIAGNOSIS — R0600 Dyspnea, unspecified: Secondary | ICD-10-CM | POA: Diagnosis not present

## 2015-05-23 DIAGNOSIS — R059 Cough, unspecified: Secondary | ICD-10-CM

## 2015-05-23 DIAGNOSIS — R05 Cough: Secondary | ICD-10-CM | POA: Diagnosis not present

## 2015-05-23 MED ORDER — BUDESONIDE-FORMOTEROL FUMARATE 160-4.5 MCG/ACT IN AERO: 2.0000 | INHALATION_SPRAY | Freq: Two times a day (BID) | RESPIRATORY_TRACT | Status: DC

## 2015-05-23 NOTE — Assessment & Plan Note (Signed)
Multifactorial: nasal congestion, sinusitis,  I do believe the patient does have a component of hyperactive airways stemming from her last bronchitis infection, however this is further exacerbated by chronic sinusitis and possibly upper airway cough syndrome/postnasal drip. We have tried multiple rounds of steroids, antibiotics with only mild relief in her symptoms. CT of the sinuses does show moderate to significant opacifications. Patient has had reconstructive nasal surgery in the past, and at this time will defer further chronic sinusitis workup to ENT for assistance. In the interm will perform bronchoscopy with BAL to rule out any atypical infections or resistant microbials. Also, stop Advair since cough is not improving and Advair can cause worsening cough, start Symbicort.  Plan: - stop advair - start symbicort 160/4.5, 1 puff bid - gargle and rinse after each use.  - we will schedule you for a bronchoscopy with BAL given your CT Chest findings - referral to see Dr. Richardson Landry (ENT) for chronic sinusitis and CT sinuses findings.

## 2015-05-23 NOTE — Assessment & Plan Note (Signed)
Multifactorial: bronchospasms, hyperactive airways, upper airway cough syndrome/postnasal drip  High-resolution CAT scan performed, there is no significant findings to suggest interstitial lung disease or pulmonary fibrosis. However, there is mild cylindrical bronchiectasis noted at the bases, this is inconjunction with chronic infection/inflammation.  I do not believe the patient has COPD, at least clinically speaking. Prior to hospitalization and prior to December 2015 she is a never smoker, although she has significant secondhand smoke exposure, but no recurrent ED visits or urgent care visits for upper spray tract infection or seasonal allergies or morning cough.  Her PFTs showed no significant obstructive process, normal FVC and FEV1.   Plan: - stop advair - start symbicort 160/4.5, 1 puff bid - gargle and rinse after each use.  - schedule  a bronchoscopy with BAL given  CT Chest findings and to rule out atypical or resistant microbials - referral to see Dr. Richardson Landry (ENT) for chronic sinusitis and CT sinuses findings.

## 2015-05-23 NOTE — Progress Notes (Signed)
MRN# [id number] Melinda Shaw Melinda Shaw Mar 05, 1966   CC: Chief Complaint  Patient presents with  . Follow-up    Pt here for f/u; she still has sob,cough, wheezing and chest discomfort. Pt experiences fatigue and weakness.      Brief History: 02/22/15 HPI:  Patient is a pleasant 49 year old female presents today for hospital visit follow-up for suspected COPD, with exacerbation. She is accompanied with her husband today. Recent hospitalization at [facility] for shortness of breath and sinus tachycardia, see below for full details. Briefly speaking patient was having shortness of breath for the past 3 months it eventually got to the point where she was feeling social breath that she came to the ED, prior to that she had 2 rounds of antibiotics and steroids with her primary care physician, but she was not getting any better. Upon arriving to the ED she was noted to have paroxysmal cough and then had sinus tachycardia, she had a CT a PE protocol that was negative for PE but did show some mild peribronchial thickening. She was admitted to the hospital for 2 days and its treated for suspected COPD exacerbation. She is a never smoker, she was exposed to secondhand smoke from her family (father and siblings). Her husband is a nonsmoker. She is treated overnight in the hospital and discharged with cardiology and pulmonary follow-up for dyspnea. Patient tells me that in December she had a bad virus upper respiratory tract infection, she was since then she has been treated with 2 rounds of antibiotics and steroids by her primary care physician. Today she endorses inductive cough, brownish sputum, fatigue, shortness of breath at rest, shortness of breath with exertion. She also states that she has some mild leg swelling, but does not endorse fluid retention. At hospital discharge she was given inhalers for Advair, Spiriva, and albuterol. Prior to hospitalization and prior to December 2015 she did not have any yearly URIs,  seasonal allergies, urgent care/ED visits for breathing problems. She has pets at home, dogs and cats. She has to roll stenosis in her backyard, but states that she has not been visiting them frequently. During office visit today she did have some coughing, which sounded mostly upper airway congestion. Plan - cont with advair, pfts, 33mwt, ECHO at cards f\u, augmentin. flonase  Events since last clinic visit: She presents today along with her husband for further evaluation of chronic cough. At her last visit she was seen as a hospital follow-up for suspected pneumonia, bronchitis, and chronic cough. At her last visit Spiriva was stopped, Advair was continued. Since her last visit she's continued to have chronic cough, unknown triggers, she is follow-up with cardiology, who has obtained an echo, which was essentially normal. Today she also had blurry function testing and 6 minute walk testing done. Patient states that she has intermittent episodes of nasal congestion, coughing interrupts her daily activities, sometimes at night coughing will wake her up or prevent her from going to sleep. Cough is dry, patient feels like she has a "rattling in my chest" but she is unable to produce productive sputum. Over the last 3 days she said using Advair has made her cough worse and she has stopped since 3 days ago. She's also been using pro-air 3 times daily. She states that her nasal congestion is intermittent at times, feeling of a drainage, sometimes she feels there is drainage in the back of her throat. She saw ENT about 10 years ago for reconstructive nasal surgery after an accidental fall where  she broke her nose. She also states the Flonase is not helping. She states that the coughing does make her shortness of breath and in the last week or 2 she's had multiple falls from feeling short of breath.  Plan - CT Chest and Sinuses, continue with Advair and flonase  Events since last clinic visit: Patient  presents today for follow-up visit of chronic cough, chronic sinusitis symptoms, intermittent wheezing, and shortness of breath with exertion. She is accompanied by her husband today. At her last visit it was decided to continue with Advair and Flonase followed by imaging studies. Today she still endorses productive cough, nasal discharge, intense shortness of breath with minimal exertion. Also stated that she's starting to have some mild dysphagia, which she describes as food being stuck in throat at times. Patient states about 10 years ago she had a reconstructive nasal surgery after 30 pound dog fell on her, surgery was performed by Dr. Malon Kindle.   Medication:   Current Outpatient Rx  Name  Route  Sig  Dispense  Refill  . albuterol (PROVENTIL HFA;VENTOLIN HFA) 108 (90 BASE) MCG/ACT inhaler   Inhalation   Inhale 2 puffs into the lungs every 6 (six) hours as needed for wheezing or shortness of breath.         . citalopram (CELEXA) 20 MG tablet   Oral   Take 20 mg by mouth daily.         . clonazePAM (KLONOPIN) 1 MG tablet   Oral   Take 1 mg by mouth 3 (three) times daily.         [name] eletriptan (RELPAX) 20 MG tablet   Oral   Take 20 mg by mouth every 2 (two) hours as needed.          . Fluticasone-Salmeterol (ADVAIR) 250-50 MCG/DOSE AEPB   Inhalation   Inhale 1 puff into the lungs 2 (two) times daily.         . furosemide (LASIX) 20 MG tablet   Oral   Take 1 tablet (20 mg total) by mouth 2 (two) times daily as needed.   180 tablet   4   . gabapentin (NEURONTIN) 100 MG capsule   Oral   Take 200 mg by mouth daily.         [name] HYDROcodone-homatropine (HYCODAN) 5-1.5 MG/5ML syrup   Oral   Take 5 mLs by mouth at bedtime.         . potassium chloride (K-DUR) 10 MEQ tablet   Oral   Take 1 tablet (10 mEq total) by mouth daily.   30 tablet   6   . QUEtiapine (SEROQUEL XR) 300 MG 24 hr tablet   Oral   Take 300 mg by mouth at bedtime.         [name] tiotropium  (SPIRIVA) 18 MCG inhalation capsule   Inhalation   Place 18 mcg into inhaler and inhale daily.         [name] tiZANidine (ZANAFLEX) 4 MG tablet   Oral   Take 4 mg by mouth every 8 (eight) hours as needed for muscle spasms.         [name] topiramate (TOPAMAX) 25 MG tablet   Oral   Take 25 mg by mouth daily.          [name] diltiazem (TIAZAC) 120 MG 24 hr capsule   Oral   Take 120 mg by mouth daily.            Review  of Systems: Gen:  Denies  fever, sweats, chills HEENT: nasal congestion, runny nose, nasal discharge Cvc:  No dizziness, chest pain or heaviness Resp:   Admits to: Gi: Denies swallowing difficulty, stomach pain, nausea or vomiting, diarrhea, constipation, bowel incontinence Gu:  Denies bladder incontinence, burning urine Ext:   No Joint pain, stiffness or swelling Skin: No skin rash, easy bruising or bleeding or hives Endoc:  No polyuria, polydipsia , polyphagia or weight change Other:  All other systems negative  Allergies:  Cherry; Sulfa antibiotics; and Bee venom  Physical Examination:  VS: BP 100/74 mmHg  Pulse 114  Temp(Src) 98.2 F (36.8 C) (Oral)  Ht 5\' 2"  (1.575 m)  Wt 186 lb (84.369 kg)  BMI 34.01 kg/m2  SpO2 95%  General Appearance: No distress  HEENT: PERRLA, no ptosis, no other lesions noticed Pulmonary:good respiratory effort, mild shallow breath sounds at the bases, no significant wheezing, no rales, no crackles. Cardiovascular:  Normal S1,S2.  No m/r/g.     Abdomen:Exam: Benign, Soft, non-tender, No masses  Skin:   warm, no rashes, no ecchymosis  Extremities: normal, no cyanosis, clubbing, warm with normal capillary refill.      Rad results: (The following images and results were reviewed by Dr. Stevenson Clinch). CT Chest 05/2015 CT CHEST WITHOUT CONTRAST  TECHNIQUE: Multidetector CT imaging of the chest was performed following the standard protocol without intravenous contrast. High resolution imaging of the lungs, as well as inspiratory and  expiratory imaging, was performed.  COMPARISON: Chest CT 02/06/2015.  FINDINGS: Mediastinum/Lymph Nodes: Heart size is normal. There is no significant pericardial fluid, thickening or pericardial calcification. No pathologically enlarged mediastinal or hilar lymph nodes. Please note that accurate exclusion of hilar adenopathy is limited on noncontrast CT scans. Esophagus is unremarkable in appearance. No axillary lymphadenopathy.  Lungs/Pleura: High-resolution images demonstrate no significant regions of ground-glass attenuation, subpleural reticulation, parenchymal banding or frank honeycombing. Mild diffuse bronchial wall thickening is noted with scattered areas of very mild cylindrical bronchiectasis, most evident in the lower lobes of the lungs bilaterally. Inspiratory and expiratory imaging is unremarkable. No acute consolidative airspace disease. No pleural effusions. 4 mm right lower lobe nodule (image 37 of series 7) is nonspecific, but is new compared to the prior examination. Likewise, there is a 3 mm subpleural nodule in the periphery of the right lower lobe (image 31 of series 7), favored to represent a subpleural lymph node. No larger more suspicious appearing pulmonary nodules or masses.  Upper Abdomen: Status post cholecystectomy.  Musculoskeletal/Soft Tissues: There are no aggressive appearing lytic or blastic lesions noted in the visualized portions of the skeleton.  IMPRESSION: 1. Findings do not suggest interstitial lung disease at this time. 2. There is diffuse bronchial wall thickening and scattered areas of mild cylindrical bronchiectasis which are predominantly in the lung bases bilaterally. 3. Two new nonspecific pulmonary nodules in the right lower lobe, largest of which measures only 4 mm. If the patient is at high risk for bronchogenic carcinoma, follow-up chest CT at 1 year is recommended. If the patient is at low risk, no follow-up is  needed. This recommendation follows the consensus statement: Guidelines for Management of Small Pulmonary Nodules Detected on CT Scans: A Statement from the [employer] as published in Radiology 2005; 237:395-400.   CT PARANASAL SINUS/MAXILLOFACIAL WITHOUT CONTRAST  TECHNIQUE: Contiguous multidetector CT images of the paranasal sinuses/maxillofacial regions were obtained in multiple projections without contrast.  COMPARISON: None.  FINDINGS: There is mild mucosal thickening in the inferior aspects  of each maxillary antrum. There is opacification and mucosal thickening in multiple ethmoid air cells bilaterally. There is mucosal thickening in the posterior left sphenoid sinus as well as in the anterior most aspects of each sphenoid sinus. There are no air-fluid levels. There is no bony destruction or expansion. The ostiomeatal unit complexes bilaterally are patent. There is no appreciable nasal turbinate edema. There is leftward deviation of the nasal septum.  Orbits appear symmetric and normal bilaterally. Mastoid air cells bilaterally are clear. Visualized brain parenchyma appears unremarkable. Salivary glands appear within normal limits bilaterally. No adenopathy is appreciable. There are no fractures or dislocations.  IMPRESSION: Areas of mucoperiosteal thickening in the maxillary antra, multiple ethmoid sinuses, and to a lesser extent in the sphenoid regions as described. Known air-fluid level. No bony destruction or expansion. Ostiomeatal unit complexes are patent bilaterally. There is leftward deviation of the nasal septum.   Assessment and Plan:49 year old female seen in consultation follow-up for chronic dyspnea and cough Cough Multifactorial: nasal congestion, sinusitis,  I do believe the patient does have a component of hyperactive airways stemming from her last bronchitis infection, however this is further exacerbated by chronic sinusitis and possibly  upper airway cough syndrome/postnasal drip. We have tried multiple rounds of steroids, antibiotics with only mild relief in her symptoms. CT of the sinuses does show moderate to significant opacifications. Patient has had reconstructive nasal surgery in the past, and at this time will defer further chronic sinusitis workup to ENT for assistance. In the interm will perform bronchoscopy with BAL to rule out any atypical infections or resistant microbials. Also, stop Advair since cough is not improving and Advair can cause worsening cough, start Symbicort.  Plan: - stop advair - start symbicort 160/4.5, 1 puff bid - gargle and rinse after each use.  - we will schedule you for a bronchoscopy with BAL given your CT Chest findings - referral to see Dr. Richardson Landry (ENT) for chronic sinusitis and CT sinuses findings.        Dyspnea Multifactorial: bronchospasms, hyperactive airways, upper airway cough syndrome/postnasal drip  High-resolution CAT scan performed, there is no significant findings to suggest interstitial lung disease or pulmonary fibrosis. However, there is mild cylindrical bronchiectasis noted at the bases, this is inconjunction with chronic infection/inflammation.  I do not believe the patient has COPD, at least clinically speaking. Prior to hospitalization and prior to December 2015 she is a never smoker, although she has significant secondhand smoke exposure, but no recurrent ED visits or urgent care visits for upper spray tract infection or seasonal allergies or morning cough.  Her PFTs showed no significant obstructive process, normal FVC and FEV1.   Plan: - stop advair - start symbicort 160/4.5, 1 puff bid - gargle and rinse after each use.  - schedule  a bronchoscopy with BAL given  CT Chest findings and to rule out atypical or resistant microbials - referral to see Dr. Richardson Landry (ENT) for chronic sinusitis and CT sinuses findings.        Updated Medication List Outpatient  Encounter Prescriptions as of 05/23/2015  Medication Sig  . albuterol (PROVENTIL HFA;VENTOLIN HFA) 108 (90 BASE) MCG/ACT inhaler Inhale 2 puffs into the lungs every 6 (six) hours as needed for wheezing or shortness of breath.  . citalopram (CELEXA) 20 MG tablet Take 20 mg by mouth daily.  . clonazePAM (KLONOPIN) 1 MG tablet Take 1 mg by mouth 3 (three) times daily.  [name] eletriptan (RELPAX) 20 MG tablet Take 20 mg by mouth every  2 (two) hours as needed.   . Fluticasone-Salmeterol (ADVAIR) 250-50 MCG/DOSE AEPB Inhale 1 puff into the lungs 2 (two) times daily.  . furosemide (LASIX) 20 MG tablet Take 1 tablet (20 mg total) by mouth 2 (two) times daily as needed.  . gabapentin (NEURONTIN) 100 MG capsule Take 200 mg by mouth daily.  [name] HYDROcodone-homatropine (HYCODAN) 5-1.5 MG/5ML syrup Take 5 mLs by mouth at bedtime.  . potassium chloride (K-DUR) 10 MEQ tablet Take 1 tablet (10 mEq total) by mouth daily.  . QUEtiapine (SEROQUEL XR) 300 MG 24 hr tablet Take 300 mg by mouth at bedtime.  [name] tiotropium (SPIRIVA) 18 MCG inhalation capsule Place 18 mcg into inhaler and inhale daily.  [name] tiZANidine (ZANAFLEX) 4 MG tablet Take 4 mg by mouth every 8 (eight) hours as needed for muscle spasms.  [name] topiramate (TOPAMAX) 25 MG tablet Take 25 mg by mouth daily.   [name] diltiazem (TIAZAC) 120 MG 24 hr capsule Take 120 mg by mouth daily.   No facility-administered encounter medications on file as of 05/23/2015.    Orders for this visit: Orders Placed This Encounter  Procedures  . Ambulatory referral to ENT    Referral Priority:  Routine    Referral Type:  Consultation    Referral Reason:  Specialty Services Required    Requested Specialty:  Otolaryngology    Number of Visits Requested:  1    Thank  you for the visitation and for allowing  [facility] to assist in the care of your patient. Our recommendations are noted above.  Please contact us if we can be of further service.  Vilinda Boehringer,  MD [facility] Office Number: [phone]

## 2015-05-23 NOTE — Patient Instructions (Signed)
Follow up with Dr. Stevenson Clinch in 1 month - stop advair - start symbicort 160/4.5, 1 puff bid - gargle and rinse after each use.  - we will schedule you for a bronchoscopy with BAL given your CT Chest findings - referral to see Dr. Richardson Landry (ENT) for chronic sinusitis and CT sinuses findings.

## 2015-05-24 ENCOUNTER — Telehealth: Payer: Self-pay | Admitting: *Deleted

## 2015-05-24 NOTE — Telephone Encounter (Signed)
Called patient to give appt information for bronchscopy scheduled for [facility] 05/29/15. Pts spouse took msg and will have patient call office back. Pt needs to be NPO after midnight on Sun 05/28/15. If patient is taking ASA she needs to stop on Fri 05/26/15. Pre-admit testing phone interview between 1p-5p Thursday July 14th. Same Day Surgery Bronch: Pt to arrive at 10am to start at 11am Monday July 18th. Letter will be mailed out with information about procedure and appointment.

## 2015-05-25 ENCOUNTER — Encounter: Payer: Self-pay | Admitting: *Deleted

## 2015-05-25 ENCOUNTER — Telehealth: Payer: Self-pay | Admitting: Internal Medicine

## 2015-05-25 ENCOUNTER — Inpatient Hospital Stay: Admission: RE | Admit: 2015-05-25 | Payer: No Typology Code available for payment source | Source: Ambulatory Visit

## 2015-05-25 NOTE — Patient Instructions (Signed)
  Your procedure is scheduled on: 05-29-15 Report to [place] [facility] To find out your arrival time please call [phone] between 1PM - 3PM on 05-26-15  Remember: Instructions that are not followed completely may result in serious medical risk, up to and including death, or upon the discretion of your surgeon and anesthesiologist your surgery may need to be rescheduled.    _X___ 1. Do not eat food or drink liquids after midnight. No gum chewing or hard candies.     _X___ 2. No Alcohol for 24 hours before or after surgery.   ____ 3. Bring all medications with you on the day of surgery if instructed.    ____ 4. Notify your doctor if there is any change in your medical condition     (cold, fever, infections).     Do not wear jewelry, make-up, hairpins, clips or nail polish.  Do not wear lotions, powders, or perfumes. You may wear deodorant.  Do not shave 48 hours prior to surgery. Men may shave face and neck.  Do not bring valuables to the hospital.    [facility] is not responsible for any belongings or valuables.               Contacts, dentures or bridgework may not be worn into surgery.  Leave your suitcase in the car. After surgery it may be brought to your room.  For patients admitted to the hospital, discharge time is determined by your treatment team.   Patients discharged the day of surgery will not be allowed to drive home.   Please read over the following fact sheets that you were given:     _X___ Take these medicines the morning of surgery with A SIP OF WATER:    1. CELEXA  2. CLONAZEPAM  3.   4.  5.  6.  ____ Fleet Enema (as directed)   ____ Use CHG Soap as directed  _X___ Use inhalers on the day of [place]  ____ Stop metformin 2 days prior to surgery    ____ Take 1/2 of usual insulin dose the night before surgery and none on the morning of surgery.   ____ Stop  Coumadin/Plavix/aspirin-N/A  ____ Stop Anti-inflammatories-NO NSAIDS OR ASA PRODUCTS-TYLENOL OK    ____ Stop supplements until after surgery.    ____ Bring C-Pap to the hospital.

## 2015-05-25 NOTE — Telephone Encounter (Signed)
Spoke with pt. She reports [facility] teeter told her that her symbicort needs PA. Called Kristopher Oppenheim and this is being faxed to triage fax #. Will await fax

## 2015-05-25 NOTE — Telephone Encounter (Signed)
Err

## 2015-05-25 NOTE — Telephone Encounter (Signed)
Called again today to give appt info for bronch scheduled Mon 05/29/15. Explained that a letter was mailed out with procedure and appt. Also notified of Pre-admit screening phone interview today between 1-5.

## 2015-05-25 NOTE — Telephone Encounter (Signed)
Need orders for Bronchoscopy on Monday

## 2015-05-25 NOTE — Telephone Encounter (Signed)
done

## 2015-05-26 ENCOUNTER — Telehealth: Payer: Self-pay | Admitting: Internal Medicine

## 2015-05-26 NOTE — Telephone Encounter (Signed)
Heather at [facility] returned call - [phone]

## 2015-05-26 NOTE — Telephone Encounter (Signed)
Pt aware of appt for bronchoscopy. Please let us know which inhaler Dulera 100 or 200?

## 2015-05-26 NOTE — Telephone Encounter (Signed)
You want to do an ENB on this patient correct?? Thanks!

## 2015-05-26 NOTE — Telephone Encounter (Signed)
Called pre admit w/ [facility] and had to [facility] x1

## 2015-05-26 NOTE — Telephone Encounter (Signed)
[facility] who covers patient medication plan. Pt must 1st try and fail Dulera. Pt has tried Advair, Sprivia, and Ventolin. Please advise.

## 2015-05-27 NOTE — Telephone Encounter (Signed)
Dulera 200 

## 2015-05-27 NOTE — Telephone Encounter (Signed)
No ENB.  This is just a regular bronch with BAL.

## 2015-05-29 ENCOUNTER — Other Ambulatory Visit: Payer: Self-pay | Admitting: Internal Medicine

## 2015-05-29 ENCOUNTER — Ambulatory Visit: Payer: No Typology Code available for payment source | Admitting: Anesthesiology

## 2015-05-29 ENCOUNTER — Ambulatory Visit: Payer: No Typology Code available for payment source | Admitting: Internal Medicine

## 2015-05-29 ENCOUNTER — Encounter: Payer: Self-pay | Admitting: *Deleted

## 2015-05-29 ENCOUNTER — Encounter
Admission: RE | Disposition: A | Discharge status: Home / Self Care | Payer: Self-pay | Source: Ambulatory Visit | Attending: Internal Medicine

## 2015-05-29 ENCOUNTER — Ambulatory Visit
Admission: RE | Admit: 2015-05-29 | Discharge: 2015-05-29 | Disposition: A | Discharge status: Home / Self Care | Payer: No Typology Code available for payment source | Source: Ambulatory Visit | Attending: Internal Medicine | Admitting: Internal Medicine

## 2015-05-29 DIAGNOSIS — R000 Tachycardia, unspecified: Secondary | ICD-10-CM | POA: Insufficient documentation

## 2015-05-29 DIAGNOSIS — Z7951 Long term (current) use of inhaled steroids: Secondary | ICD-10-CM | POA: Insufficient documentation

## 2015-05-29 DIAGNOSIS — M7989 Other specified soft tissue disorders: Secondary | ICD-10-CM | POA: Insufficient documentation

## 2015-05-29 DIAGNOSIS — Z79899 Other long term (current) drug therapy: Secondary | ICD-10-CM | POA: Diagnosis not present

## 2015-05-29 DIAGNOSIS — I10 Essential (primary) hypertension: Secondary | ICD-10-CM | POA: Diagnosis not present

## 2015-05-29 DIAGNOSIS — R918 Other nonspecific abnormal finding of lung field: Secondary | ICD-10-CM

## 2015-05-29 DIAGNOSIS — E669 Obesity, unspecified: Secondary | ICD-10-CM | POA: Diagnosis not present

## 2015-05-29 DIAGNOSIS — Z6834 Body mass index (BMI) 34.0-34.9, adult: Secondary | ICD-10-CM | POA: Insufficient documentation

## 2015-05-29 DIAGNOSIS — J45909 Unspecified asthma, uncomplicated: Secondary | ICD-10-CM | POA: Insufficient documentation

## 2015-05-29 DIAGNOSIS — R0602 Shortness of breath: Secondary | ICD-10-CM | POA: Diagnosis not present

## 2015-05-29 DIAGNOSIS — J156 Pneumonia due to other aerobic Gram-negative bacteria: Secondary | ICD-10-CM | POA: Insufficient documentation

## 2015-05-29 HISTORY — DX: Fibromyalgia: M79.7

## 2015-05-29 HISTORY — DX: Anxiety disorder, unspecified: F41.9

## 2015-05-29 HISTORY — DX: Headache, unspecified: R51.9

## 2015-05-29 HISTORY — DX: Anemia, unspecified: D64.9

## 2015-05-29 HISTORY — PX: VIDEO BRONCHOSCOPY: SHX5072

## 2015-05-29 HISTORY — DX: Essential (primary) hypertension: I10

## 2015-05-29 HISTORY — DX: Headache: R51

## 2015-05-29 LAB — POTASSIUM: Potassium, serum: 3.9

## 2015-05-29 LAB — POCT PREGNANCY, URINE: PREG TEST UR: NEGATIVE

## 2015-05-29 SURGERY — VIDEO BRONCHOSCOPY WITHOUT FLUORO
Anesthesia: General

## 2015-05-29 MED ORDER — MIDAZOLAM HCL 2 MG/2ML IJ SOLN
INTRAMUSCULAR | Status: DC | PRN
  Administered 2015-05-29: 2 mg via INTRAVENOUS

## 2015-05-29 MED ORDER — GLYCOPYRROLATE 0.2 MG/ML IJ SOLN
INTRAMUSCULAR | Status: DC | PRN
  Administered 2015-05-29: 0.4 mg via INTRAVENOUS

## 2015-05-29 MED ORDER — MOMETASONE FURO-FORMOTEROL FUM 200-5 MCG/ACT IN AERO: 2.0000 | INHALATION_SPRAY | Freq: Two times a day (BID) | RESPIRATORY_TRACT | Status: DC

## 2015-05-29 MED ORDER — SUCCINYLCHOLINE CHLORIDE 20 MG/ML IJ SOLN
INTRAMUSCULAR | Status: DC | PRN
  Administered 2015-05-29: 100 mg via INTRAVENOUS

## 2015-05-29 MED ORDER — NEOSTIGMINE METHYLSULFATE 10 MG/10ML IV SOLN
INTRAVENOUS | Status: DC | PRN
  Administered 2015-05-29: 3 mg via INTRAVENOUS

## 2015-05-29 MED ORDER — FAMOTIDINE 20 MG PO TABS
ORAL_TABLET | ORAL | Status: AC
Start: 2015-05-29 — End: 2015-05-29
  Administered 2015-05-29: 20 mg via ORAL
  Filled 2015-05-29: qty 1

## 2015-05-29 MED ORDER — FENTANYL CITRATE (PF) 100 MCG/2ML IJ SOLN
INTRAMUSCULAR | Status: DC | PRN
  Administered 2015-05-29: 100 ug via INTRAVENOUS

## 2015-05-29 MED ORDER — ROCURONIUM BROMIDE 100 MG/10ML IV SOLN
INTRAVENOUS | Status: DC | PRN
  Administered 2015-05-29 (×2): 10 mg via INTRAVENOUS

## 2015-05-29 MED ORDER — FENTANYL CITRATE (PF) 100 MCG/2ML IJ SOLN: 25.0000 ug | INTRAMUSCULAR | Status: DC | PRN

## 2015-05-29 MED ORDER — METOCLOPRAMIDE HCL 5 MG/ML IJ SOLN: 10.0000 mg | Freq: Once | INTRAMUSCULAR | Status: DC | PRN

## 2015-05-29 MED ORDER — DEXAMETHASONE SODIUM PHOSPHATE 4 MG/ML IJ SOLN
INTRAMUSCULAR | Status: DC | PRN
  Administered 2015-05-29: 10 mg via INTRAVENOUS

## 2015-05-29 MED ORDER — BUTAMBEN-TETRACAINE-BENZOCAINE 2-2-14 % EX AERO: 1.0000 | INHALATION_SPRAY | Freq: Once | CUTANEOUS | Status: DC

## 2015-05-29 MED ORDER — LIDOCAINE HCL (PF) 4 % IJ SOLN
INTRAMUSCULAR | Status: DC | PRN
  Administered 2015-05-29: 4 mL via INTRADERMAL

## 2015-05-29 MED ORDER — ONDANSETRON HCL 4 MG/2ML IJ SOLN
INTRAMUSCULAR | Status: DC | PRN
  Administered 2015-05-29: 4 mg via INTRAVENOUS

## 2015-05-29 MED ORDER — MIDAZOLAM HCL 2 MG/2ML IJ SOLN
INTRAMUSCULAR | Status: AC
  Administered 2015-05-29: 2 mg
  Filled 2015-05-29: qty 2

## 2015-05-29 MED ORDER — PHENYLEPHRINE HCL 0.25 % NA SOLN: 1.0000 | Freq: Four times a day (QID) | NASAL | Status: DC | PRN

## 2015-05-29 MED ORDER — LIDOCAINE HCL 2 % EX GEL: 1.0000 "application " | Freq: Once | CUTANEOUS | Status: DC

## 2015-05-29 MED ORDER — PROPOFOL 10 MG/ML IV BOLUS
INTRAVENOUS | Status: DC | PRN
  Administered 2015-05-29: 170 mg via INTRAVENOUS

## 2015-05-29 MED ORDER — LACTATED RINGERS IV SOLN
INTRAVENOUS | Status: DC
  Administered 2015-05-29: 11:00:00 via INTRAVENOUS

## 2015-05-29 MED ORDER — FAMOTIDINE 20 MG PO TABS
20.0000 mg | ORAL_TABLET | Freq: Once | ORAL | Status: AC
  Administered 2015-05-29: 20 mg via ORAL

## 2015-05-29 NOTE — Transfer of Care (Signed)
Immediate Anesthesia Transfer of Care Note  Patient: Melinda Shaw  Procedure(s) Performed: Procedure(s): VIDEO BRONCHOSCOPY WITHOUT FLUORO (N/A)  Patient Location: PACU  Anesthesia Type:General  Level of Consciousness: sedated and patient cooperative  Airway & Oxygen Therapy: Patient Spontanous Breathing and Patient connected to face mask oxygen  Post-op Assessment: Report given to RN and Post -op Vital signs reviewed and stable  Post vital signs: Reviewed and stable  Last Vitals:  Filed Vitals:   05/29/15 1153  BP: 141/94  Pulse: 86  Temp: 36.8 C  Resp: 15    Complications: No apparent anesthesia complications

## 2015-05-29 NOTE — Telephone Encounter (Signed)
LM with [facility] letting them know that this for a regular bronch with BAL per VM. Nothing further was needed.

## 2015-05-29 NOTE — Op Note (Signed)
[facility] Patient Name: Melinda Shaw Procedure Date: 05/29/2015 10:47 AM MRN: [id number] Account #: [id number] Date of Birth: [birth year] Admit Type: Outpatient Age: 49 Room: [facility] PROCEDURE RM 01 Gender: Female Note Status: Finalized Attending MD: Vilinda Boehringer,  Procedure:         Bronchoscopy Indications:       Atelectasis of the right middle lobe, Atelectasis of the                     right lower lobe, Unresolving left upper lobe infiltrate,                     Unresolving left lower lobe infiltrate Providers:         Jamesa Tedrick, Sullivan Lone, Dentist) Referring MD:       Medicines:         See the Anesthesia note for documentation of the                     administered medications Complications:     No immediate complications Procedure:         Pre-Anesthesia Assessment:                    - A History and Physical has been performed. Patient meds                     and allergies have been reviewed. The risks and benefits                     of the procedure and the sedation options and risks were                     discussed with the patient. All questions were answered                     and informed consent was obtained. Patient identification                     and proposed procedure were verified prior to the                     procedure. Mental Status Examination: normal. Respiratory                     Examination: bibasilar crackles. CV Examination: normal.                     After reviewing the risks and benefits, the patient was                     deemed in satisfactory condition to undergo the procedure.                     The anesthesia plan was to use general anesthesia.                     Immediately prior to administration of medications, the                     patient was re-assessed for adequacy to receive sedatives.                     The heart rate, respiratory rate, oxygen saturations,   blood pressure, adequacy of pulmonary ventilation,  and                     response to care were monitored throughout the procedure.                     The physical status of the patient was re-assessed after                     the procedure.                    After obtaining informed consent, the bronchoscope was                     passed under direct vision. Throughout the procedure, the                     patient's blood pressure, pulse, and oxygen saturations                     were monitored continuously. the Bronchoscope Olympus                     [id number] was introduced through the mouth, via                     the endotracheal tube (the patient was intubated for the                     procedure) and advanced to the tracheobronchial tree. The                     procedure was accomplished without difficulty. Findings:      Left Lung Abnormalities: Copious, mucoid and white exudate was found in       the left mainstem bronchus, in the left upper lobe and in the left lower       lobe. Copious secretions were found throughout the left tracheobronchial       tree. They were not obstructing the airway.      Right Lung Abnormalities: Copious, mucoid and white secretions were       found in the right upper lobe, in the right middle lobe and in the right       lower lobe. They were not obstructing the airway.      Bronchoalveolar lavage was performed in the lingula of the lung and sent       for cell count, bacterial culture, viral smears & culture, and fungal &       AFB analysis and cytology. 40 mL of fluid were instilled. 20 mL were       returned. The return was blood-tinged and cloudy. There were no mucoid       plugs in the return fluid Multiple specimens were obtained and pooled       into one specimen, which was sent for analysis      Bronchoalveolar lavage was performed in the right middle lobe of the       lung and sent for cell count, bacterial culture, viral  smears & culture,       and fungal & AFB analysis and cytology. 50 mL of fluid were instilled.       20 mL were returned. The return was blood-tinged, cloudy and mucoid.       There were no mucoid plugs in the return fluid Multiple  specimens were       obtained and pooled into one specimen, which was sent for analysis Impression:        - Unresolving left upper lobe infiltrate                    - Unresolving left lower lobe infiltrate                    - Exudate was found in the left mainstem bronchus, in the                     left upper lobe and in the left lower lobe.                    - Copious secretions were found throughout the                     tracheobronchial tree.                    - Copious, mucoid and white secretions were found in the                     right upper lobe, in the right middle lobe and in the                     right lower lobe.                    - Lingula Bronchoalveolar lavage was performed.                    - RML Bronchoalveolar lavage was performed. Recommendation:    - Await test results. Briley Bumgarner,  05/29/2015 12:02:19 PM Number of Addenda: 0 Note Initiated On: 05/29/2015 10:47 AM      [facility]

## 2015-05-29 NOTE — Discharge Instructions (Signed)
General Anesthesia, Care After Refer to this sheet in the next few weeks. These instructions provide you with information on caring for yourself after your procedure. Your health care provider may also give you more specific instructions. Your treatment has been planned according to current medical practices, but problems sometimes occur. Call your health care provider if you have any problems or questions after your procedure. WHAT TO EXPECT AFTER THE PROCEDURE After the procedure, it is typical to experience:  Sleepiness.  Nausea and vomiting. HOME CARE INSTRUCTIONS  For the first 24 hours after general anesthesia:  Have a responsible person with you.  Do not drive a car. If you are alone, do not take public transportation.  Do not drink alcohol.  Do not take medicine that has not been prescribed by your health care provider.  Do not sign important papers or make important decisions.  You may resume a normal diet and activities as directed by your health care provider.  Change bandages (dressings) as directed.  If you have questions or problems that seem related to general anesthesia, call the hospital and ask for the anesthetist or anesthesiologist on call. SEEK MEDICAL CARE IF:  You have nausea and vomiting that continue the day after anesthesia.  You develop a rash. SEEK IMMEDIATE MEDICAL CARE IF:   You have difficulty breathing.  You have chest pain.  You have any allergic problems. Document Released: 02/03/2001 Document Revised: 11/02/2013 Document Reviewed: 05/13/2013 [facility] Patient Information 2015 [place], [place]. This information is not intended to replace advice given to you by your health care provider. Make sure you discuss any questions you have with your health care provider.  Flexible Bronchoscopy, Care After These instructions give you information on caring for yourself after your procedure. Your doctor may also give you more specific instructions. Call  your doctor if you have any problems or questions after your procedure. HOME CARE  Do not eat or drink anything for 2 hours after your procedure. If you try to eat or drink before the medicine wears off, food or drink could go into your lungs. You could also burn yourself.  After 2 hours have passed and when you can cough and gag normally, you may eat soft food and drink liquids slowly.  The day after the test, you may eat your normal diet.  You may do your normal activities.  Keep all doctor visits. GET HELP RIGHT AWAY IF:  You get more and more short of breath.  You get light-headed.  You feel like you are going to pass out (faint).  You have chest pain.  You have new problems that worry you.  You cough up more than a little blood.  You cough up more blood than before. MAKE SURE YOU:  Understand these instructions.  Will watch your condition.  Will get help right away if you are not doing well or get worse. Document Released: 08/25/2009 Document Revised: 11/02/2013 Document Reviewed: 07/02/2013 [facility] Patient Information 2015 [place], [place]. This information is not intended to replace advice given to you by your health care provider. Make sure you discuss any questions you have with your health care provider.

## 2015-05-29 NOTE — Telephone Encounter (Signed)
Pt aware of med change.  Med sent to preferred pharmacy.  Nothing further needed.

## 2015-05-29 NOTE — Anesthesia Preprocedure Evaluation (Signed)
Anesthesia Evaluation  Patient identified by MRN, date of birth, ID band Patient awake    Reviewed: Allergy & Precautions, NPO status , Patient's Chart, lab work & pertinent test results  Airway Mallampati: II  TM Distance: >3 FB Neck ROM: Full    Dental  (+) Teeth Intact   Pulmonary shortness of breath, with exertion and at rest, asthma ,  breath sounds clear to auscultation        Cardiovascular hypertension, Pt. on medications Rhythm:Regular Rate:Tachycardia     Neuro/Psych    GI/Hepatic   Endo/Other    Renal/GU      Musculoskeletal   Abdominal (+) + obese,  Abdomen: soft.    Peds  Hematology   Anesthesia Other Findings   Reproductive/Obstetrics                             Anesthesia Physical Anesthesia Plan  ASA: III  Anesthesia Plan: General   Post-op Pain Management:    Induction: Intravenous  Airway Management Planned: Oral ETT  Additional Equipment:   Intra-op Plan:   Post-operative Plan: Extubation in OR  Informed Consent: I have reviewed the patients History and Physical, chart, labs and discussed the procedure including the risks, benefits and alternatives for the proposed anesthesia with the patient or authorized representative who has indicated his/her understanding and acceptance.     Plan Discussed with: CRNA  Anesthesia Plan Comments:         Anesthesia Quick Evaluation

## 2015-05-29 NOTE — Anesthesia Procedure Notes (Signed)
Procedure Name: Intubation Date/Time: 05/29/2015 11:24 AM Performed by: Jonna Clark Pre-anesthesia Checklist: Patient identified, Patient being monitored, Timeout performed, Emergency Drugs available and Suction available Patient Re-evaluated:Patient Re-evaluated prior to inductionOxygen Delivery Method: Circle system utilized Preoxygenation: Pre-oxygenation with 100% oxygen Intubation Type: IV induction Ventilation: Mask ventilation without difficulty Laryngoscope Size: Mac and 3 Grade View: Grade I Tube type: Oral Tube size: 8.0 mm Number of attempts: 1 Airway Equipment and Method: Stylet and LTA kit utilized Placement Confirmation: ETT inserted through vocal cords under direct vision,  positive ETCO2 and breath sounds checked- equal and bilateral Secured at: 21 cm Tube secured with: Tape Dental Injury: Teeth and Oropharynx as per pre-operative assessment

## 2015-05-29 NOTE — H&P (Signed)
See clinic H&P 05/23/15. No changes noted. Persistent respiratory infections in the the setting of chronic sinusitis. Plan for BAL (right and left).   Vilinda Boehringer, MD [facility] Pager [phone] (Please enter 7-digits) Clinic - [phone]

## 2015-05-29 NOTE — Anesthesia Postprocedure Evaluation (Signed)
  Anesthesia Post-op Note  Patient: Melinda Shaw  Procedure(s) Performed: Procedure(s): VIDEO BRONCHOSCOPY WITHOUT FLUORO (N/A)  Anesthesia type:General  Patient location: PACU  Post pain: Pain level controlled  Post assessment: Post-op Vital signs reviewed, Patient's Cardiovascular Status Stable, Respiratory Function Stable, Patent Airway and No signs of Nausea or vomiting  Post vital signs: Reviewed and stable  Last Vitals:  Filed Vitals:   05/29/15 1153  BP: 141/94  Pulse: 86  Temp: 36.8 C  Resp: 15    Level of consciousness: awake, alert  and patient cooperative  Complications: No apparent anesthesia complications

## 2015-05-30 LAB — BODY FLUID CELL COUNT WITH DIFFERENTIAL

## 2015-05-31 ENCOUNTER — Other Ambulatory Visit: Payer: Self-pay | Admitting: Internal Medicine

## 2015-05-31 ENCOUNTER — Telehealth: Payer: Self-pay | Admitting: Internal Medicine

## 2015-05-31 LAB — CULTURE, BAL-QUANTITATIVE: SPECIAL REQUESTS: NORMAL

## 2015-05-31 LAB — CULTURE, BAL-QUANTITATIVE W GRAM STAIN: Special Requests: NORMAL

## 2015-05-31 MED ORDER — CIPROFLOXACIN HCL 750 MG PO TABS: 750.0000 mg | ORAL_TABLET | Freq: Two times a day (BID) | ORAL | Status: DC

## 2015-05-31 NOTE — Telephone Encounter (Signed)
Call spoke with patient. Informed of the results from the BAL, heavy growth Proteus mirabilis sensitive to ciprofloxacin. Prescription for ciprofloxacin 750 mg 1 tab by mouth twice a day 14 days sent to patient's pharmacy. This was communicated with the patient verbally also.

## 2015-06-01 ENCOUNTER — Telehealth: Payer: Self-pay | Admitting: *Deleted

## 2015-06-01 NOTE — Telephone Encounter (Signed)
-----   Message from Vilinda Boehringer, MD sent at 05/31/2015  5:39 PM EDT ----- Regarding: bronchoscopy results Patient BAL (washing) from the Bronchoscopy on Monday is growing heavy amounts of bacteria called Proteus Mirablis.  Will treat with Cipro 750mg  - 1 tab BID x 14 days.   Thank you Dr. Stevenson Clinch

## 2015-06-01 NOTE — Telephone Encounter (Signed)
rx sent to the pharmacy and pt notified by Dr. Stevenson Clinch.

## 2015-06-06 ENCOUNTER — Other Ambulatory Visit: Payer: Self-pay | Admitting: Internal Medicine

## 2015-06-06 ENCOUNTER — Telehealth: Payer: Self-pay | Admitting: Internal Medicine

## 2015-06-06 MED ORDER — CIPROFLOXACIN HCL 500 MG PO TABS: 500.0000 mg | ORAL_TABLET | Freq: Two times a day (BID) | ORAL | Status: AC

## 2015-06-06 NOTE — Telephone Encounter (Signed)
Spoke with patient about her BAL culture results.  Informed her that I discussed her case informally Infectious Disease (Dr. Ola Spurr), who recommended a total of 21  Days of Cipro (for P. Mirabilis lung infection, on BAL).  She is currently on Cipro 750 twice a day, for her last week she will be on Cipro 500 twice a day (this will start after her current Cipro rx ends). Patient verbalized understanding. Prescription will be called in to her pharmacy.  Also informed patient that her fungal cultures still are still pending.  - Dr. Stevenson Clinch

## 2015-06-27 ENCOUNTER — Ambulatory Visit (INDEPENDENT_AMBULATORY_CARE_PROVIDER_SITE_OTHER): Payer: No Typology Code available for payment source | Admitting: Internal Medicine

## 2015-06-27 ENCOUNTER — Encounter: Payer: Self-pay | Admitting: Internal Medicine

## 2015-06-27 VITALS — BP 134/84 | HR 110 | Wt 188.0 lb

## 2015-06-27 DIAGNOSIS — R05 Cough: Secondary | ICD-10-CM

## 2015-06-27 DIAGNOSIS — R0600 Dyspnea, unspecified: Secondary | ICD-10-CM | POA: Diagnosis not present

## 2015-06-27 DIAGNOSIS — R059 Cough, unspecified: Secondary | ICD-10-CM

## 2015-06-27 DIAGNOSIS — R918 Other nonspecific abnormal finding of lung field: Secondary | ICD-10-CM

## 2015-06-27 NOTE — Patient Instructions (Addendum)
Follow up with Dr. Stevenson Clinch in 6-8 weeks - incentive spirometry 20-25 times per day (we will call you when we get samples) - cont with current inhalers - albuterol RESCUE inhaler - 2puff every 3-4 hours as needed for shortness of breath\wheezing\recurrent cough - referral to see Infectious Diease Specialist - Dr. Ola Spurr - ID referral for persistent lung infiltrate  - increase exercise activity given the level of deconditioning - please consider getting your home tested for mold/fungus - 2 view CXR prior to follow visit.

## 2015-06-27 NOTE — Assessment & Plan Note (Signed)
Multifactorial: bronchospasms, hyperactive airways, upper airway cough syndrome/postnasal drip, deconditioning  High-resolution CAT scan performed, there is no significant findings to suggest interstitial lung disease or pulmonary fibrosis. However, there is mild cylindrical bronchiectasis noted at the bases, this is inconjunction with chronic infection/inflammation.  I do not believe the patient has COPD, at least clinically speaking. Prior to hospitalization and prior to December 2015 she is a never smoker, although she has significant secondhand smoke exposure, but no recurrent ED visits or urgent care visits for upper spray tract infection or seasonal allergies or morning cough.  Her PFTs showed no significant obstructive process, normal FVC and FEV1.   Plan: - cont with dulera - cont with rescue inhaler, patient educated on proper use - see plan for lung infiltrate - follow up with Dr. Richardson Landry (ENT) for chronic sinusitis and CT sinuses findings.

## 2015-06-27 NOTE — Assessment & Plan Note (Signed)
Multifactorial: nasal congestion, sinusitis, deconditioning  I do believe the patient does have a component of hyperactive airways stemming from her last bronchitis infection, however this is further exacerbated by chronic sinusitis and possibly upper airway cough syndrome/postnasal drip. We have tried multiple rounds of steroids, antibiotics with only mild relief in her symptoms. CT of the sinuses does show moderate to significant opacifications. Patient has had reconstructive nasal surgery in the past, and at this time will defer further chronic sinusitis workup to ENT for assistance. She has been treated for Proteus growth based on her BAL cultures from bronchoscopy in July 2016 Cough is multifactorial - UACS, deconditioning, post-infectious  Plan: - Cont with Dulera, and rescue inhaler - follow up with Dr. Richardson Landry (ENT) for chronic sinusitis and CT sinuses findings.  - incentive spirometry

## 2015-06-27 NOTE — Progress Notes (Signed)
MRN# [id number] Melinda Shaw [birth year]   CC: Chief Complaint  Patient presents with  . Follow-up    bronch; still having SOB and lightheadedness;       Brief History: 02/22/15 HPI:  Patient is a pleasant 49 year old female presents today for hospital visit follow-up for suspected COPD, with exacerbation. She is accompanied with her husband today. Recent hospitalization at [facility] for shortness of breath and sinus tachycardia, see below for full details. Briefly speaking patient was having shortness of breath for the past 3 months it eventually got to the point where she was feeling social breath that she came to the ED, prior to that she had 2 rounds of antibiotics and steroids with her primary care physician, but she was not getting any better. Upon arriving to the ED she was noted to have paroxysmal cough and then had sinus tachycardia, she had a CT a PE protocol that was negative for PE but did show some mild peribronchial thickening. She was admitted to the hospital for 2 days and its treated for suspected COPD exacerbation. She is a never smoker, she was exposed to secondhand smoke from her family (father and siblings). Her husband is a nonsmoker. She is treated overnight in the hospital and discharged with cardiology and pulmonary follow-up for dyspnea. Patient tells me that in December she had a bad virus upper respiratory tract infection, she was since then she has been treated with 2 rounds of antibiotics and steroids by her primary care physician. Today she endorses inductive cough, brownish sputum, fatigue, shortness of breath at rest, shortness of breath with exertion. She also states that she has some mild leg swelling, but does not endorse fluid retention. At hospital discharge she was given inhalers for Advair, Spiriva, and albuterol. Prior to hospitalization and prior to December 2015 she did not have any yearly URIs, seasonal allergies, urgent care/ED visits for breathing  problems. She has pets at home, dogs and cats. She has to roll stenosis in her backyard, but states that she has not been visiting them frequently. During office visit today she did have some coughing, which sounded mostly upper airway congestion. Plan - cont with advair, pfts, 92mwt, ECHO at cards f\u, augmentin. flonase  ROV 02/2015 She presents today along with her husband for further evaluation of chronic cough. At her last visit she was seen as a hospital follow-up for suspected pneumonia, bronchitis, and chronic cough. At her last visit Spiriva was stopped, Advair was continued. Since her last visit she's continued to have chronic cough, unknown triggers, she is follow-up with cardiology, who has obtained an echo, which was essentially normal. Today she also had blurry function testing and 6 minute walk testing done. Patient states that she has intermittent episodes of nasal congestion, coughing interrupts her daily activities, sometimes at night coughing will wake her up or prevent her from going to sleep. Cough is dry, patient feels like she has a "rattling in my chest" but she is unable to produce productive sputum. Over the last 3 days she said using Advair has made her cough worse and she has stopped since 3 days ago. She's also been using pro-air 3 times daily. She states that her nasal congestion is intermittent at times, feeling of a drainage, sometimes she feels there is drainage in the back of her throat. She saw ENT about 10 years ago for reconstructive nasal surgery after an accidental fall where she broke her nose. She also states the Flonase is not helping. She  states that the coughing does make her shortness of breath and in the last week or 2 she's had multiple falls from feeling short of breath.  Plan - CT Chest and Sinuses, continue with Advair and flonase  ROV 05/2015 Patient presents today for follow-up visit of chronic cough, chronic sinusitis symptoms, intermittent  wheezing, and shortness of breath with exertion. She is accompanied by her husband today. At her last visit it was decided to continue with Advair and Flonase followed by imaging studies. Today she still endorses productive cough, nasal discharge, intense shortness of breath with minimal exertion. Also stated that she's starting to have some mild dysphagia, which she describes as food being stuck in throat at times. Patient states about 10 years ago she had a reconstructive nasal surgery after 30 pound dog fell on her, surgery was performed by Dr. Malon Kindle. Plan: questionable COPD, no clinical s\s, normal PFTs, normal FEV1 and FVC - stop advair - start symbicort 160/4.5, 1 puff bid - gargle and rinse after each use.  - schedule a bronchoscopy with BAL given CT Chest findings and to rule out atypical or resistant microbials - referral to see Dr. Richardson Landry (ENT) for chronic sinusitis and CT sinuses findings  Events since last clinic visit: Patient presents today for a follow-up visit of recurrent bronchitis/pneumonia. Since her last visit she's had a bronchoscopy with BAL. Microbiology cultures from BAL showed heavy growth Proteus, she has been treated with 21 days of Cipro based on sensitivities. Patient states today she still has a nonproductive cough, using albuterol 3-4 times per day, with subjective shortness of breath, at times will have a coughing spell She states she gets shortness of breath with 2-3 sentences. Her vitals today show no desaturation.      Medication:   Current Outpatient Rx  Name  Route  Sig  Dispense  Refill  . albuterol (PROVENTIL HFA;VENTOLIN HFA) 108 (90 BASE) MCG/ACT inhaler   Inhalation   Inhale 2 puffs into the lungs every 6 (six) hours as needed for wheezing or shortness of breath.         . budesonide-formoterol (SYMBICORT) 160-4.5 MCG/ACT inhaler   Inhalation   Inhale 2 puffs into the lungs 2 (two) times daily.   1 Inhaler   3   . citalopram  (CELEXA) 20 MG tablet   Oral   Take 20 mg by mouth every morning.          . clonazePAM (KLONOPIN) 1 MG tablet   Oral   Take 1 mg by mouth 3 (three) times daily.         [name] diltiazem (TIAZAC) 120 MG 24 hr capsule   Oral   Take 120 mg by mouth daily.         [name] eletriptan (RELPAX) 20 MG tablet   Oral   Take 20 mg by mouth every 2 (two) hours as needed.          . Fluticasone-Salmeterol (ADVAIR) 250-50 MCG/DOSE AEPB   Inhalation   Inhale 1 puff into the lungs 2 (two) times daily.         . furosemide (LASIX) 20 MG tablet   Oral   Take 1 tablet (20 mg total) by mouth 2 (two) times daily as needed.   180 tablet   4   . gabapentin (NEURONTIN) 100 MG capsule   Oral   Take 200 mg by mouth at bedtime.          . mometasone-formoterol (DULERA) 200-5  MCG/ACT AERO   Inhalation   Inhale 2 puffs into the lungs 2 (two) times daily.   1 Inhaler   3   . potassium chloride (K-DUR) 10 MEQ tablet   Oral   Take 1 tablet (10 mEq total) by mouth daily.   30 tablet   6   . QUEtiapine (SEROQUEL XR) 300 MG 24 hr tablet   Oral   Take 300 mg by mouth at bedtime.         [name] tiotropium (SPIRIVA) 18 MCG inhalation capsule   Inhalation   Place 18 mcg into inhaler and inhale as needed.          [name] tiZANidine (ZANAFLEX) 4 MG tablet   Oral   Take 4 mg by mouth every 8 (eight) hours as needed for muscle spasms.         [name] topiramate (TOPAMAX) 25 MG tablet   Oral   Take 25 mg by mouth at bedtime.             Review of Systems: Gen:  Denies  fever, sweats, chills HEENT: Denies blurred vision, double vision, ear pain, eye pain, hearing loss, nose bleeds, sore throat Cvc:  No dizziness, chest pain or heaviness Resp:   Admits to: Shortness of breath, cough, dyspnea on exertion Gi: Denies swallowing difficulty, stomach pain, nausea or vomiting, diarrhea, constipation, bowel incontinence Gu:  Denies bladder incontinence, burning urine Ext:   No Joint pain, stiffness or  swelling Skin: No skin rash, easy bruising or bleeding or hives Endoc:  No polyuria, polydipsia , polyphagia or weight change Other:  All other systems negative  Allergies:  Cherry; Shellfish allergy; Sulfa antibiotics; and Bee venom  Physical Examination:  VS: BP 134/84 mmHg  Pulse 110  Wt 188 lb (85.276 kg)  SpO2 95%  General Appearance: No distress  HEENT: PERRLA, no ptosis, no other lesions noticed Pulmonary:normal breath sounds., diaphragmatic excursion normal.No wheezing, No rales   Cardiovascular:  Normal S1,S2.  No m/r/g.     Abdomen:Exam: Benign, Soft, non-tender, No masses  Skin:   warm, no rashes, no ecchymosis  Extremities: normal, no cyanosis, clubbing, warm with normal capillary refill.      LAB from BAL 05/23/15 Specimen Description BRONCHIAL ALVEOLAR LAVAGE   Special Requests Normal   Gram Stain MANY WBC SEEN  MODERATE GRAM NEGATIVE RODS       Culture HEAVY GROWTH PROTEUS MIRABILIS   Report Status 05/31/2015 FINAL   Organism ID, Bacteria PROTEUS MIRABILIS           Specimen Description BRONCHIAL ALVEOLAR LAVAGE   Special Requests Normal   Culture CANDIDA ALBICANS  MOLD ISOLATED  REFER TO OTHER BAL CULTURE FOR STATE LAB IDENTIFICATION       Report Status PENDING          Assessment and Plan: A 49 year old female with past medical history of recurrent lung infiltrates seen in follow-up visit after bronchoscopy with BAL Lung infiltrate Patient with persistent bilateral lower lobe fleeting lung infiltrates. She's been treated multiple times with multiple rounds of antibiotics. Bronchoscopy with BAL in July 2016 showed heavy growth Proteus and preliminary mold and Candida on the fungal cultures. Patient was treated for Proteus with 21 days of Cipro based on sensitivities. Final fungal and will cultures are currently pending.  Patient is still having cough with dyspnea on exertion and intermittent shortness of breath at rest.  Plan: -  Completed treatment for Proteus and BAL for 21 days of Cipro -Preliminary  fungal cultures were Candida and possible mold -Given exotic organisms and BAL along with possible fungal infection, will refer to infectious disease, Dr. Ola Spurr, for assistance in persistent lung infection treatment -Incentive spirometry 20-25 times per day -2 view chest x-ray prior to follow-up -Increase exercise tolerance -Patient educated again on use of rescue inhaler.   Dyspnea Multifactorial: bronchospasms, hyperactive airways, upper airway cough syndrome/postnasal drip, deconditioning  High-resolution CAT scan performed, there is no significant findings to suggest interstitial lung disease or pulmonary fibrosis. However, there is mild cylindrical bronchiectasis noted at the bases, this is inconjunction with chronic infection/inflammation.  I do not believe the patient has COPD, at least clinically speaking. Prior to hospitalization and prior to December 2015 she is a never smoker, although she has significant secondhand smoke exposure, but no recurrent ED visits or urgent care visits for upper spray tract infection or seasonal allergies or morning cough.  Her PFTs showed no significant obstructive process, normal FVC and FEV1.   Plan: - cont with dulera - cont with rescue inhaler, patient educated on proper use - see plan for lung infiltrate - follow up with Dr. Richardson Landry (ENT) for chronic sinusitis and CT sinuses findings.        Cough Multifactorial: nasal congestion, sinusitis, deconditioning  I do believe the patient does have a component of hyperactive airways stemming from her last bronchitis infection, however this is further exacerbated by chronic sinusitis and possibly upper airway cough syndrome/postnasal drip. We have tried multiple rounds of steroids, antibiotics with only mild relief in her symptoms. CT of the sinuses does show moderate to significant opacifications. Patient has had  reconstructive nasal surgery in the past, and at this time will defer further chronic sinusitis workup to ENT for assistance. She has been treated for Proteus growth based on her BAL cultures from bronchoscopy in July 2016 Cough is multifactorial - UACS, deconditioning, post-infectious  Plan: - Cont with Dulera, and rescue inhaler - follow up with Dr. Richardson Landry (ENT) for chronic sinusitis and CT sinuses findings.  - incentive spirometry          Updated Medication List Outpatient Encounter Prescriptions as of 06/27/2015  Medication Sig  . albuterol (PROVENTIL HFA;VENTOLIN HFA) 108 (90 BASE) MCG/ACT inhaler Inhale 2 puffs into the lungs every 6 (six) hours as needed for wheezing or shortness of breath.  . budesonide-formoterol (SYMBICORT) 160-4.5 MCG/ACT inhaler Inhale 2 puffs into the lungs 2 (two) times daily. (Patient not taking: Reported on 05/25/2015)  . ciprofloxacin (CIPRO) 750 MG tablet Take 1 tablet (750 mg total) by mouth 2 (two) times daily.  . citalopram (CELEXA) 20 MG tablet Take 20 mg by mouth every morning.   . clonazePAM (KLONOPIN) 1 MG tablet Take 1 mg by mouth 3 (three) times daily.  [name] diltiazem (TIAZAC) 120 MG 24 hr capsule Take 120 mg by mouth daily.  [name] eletriptan (RELPAX) 20 MG tablet Take 20 mg by mouth every 2 (two) hours as needed.   . Fluticasone-Salmeterol (ADVAIR) 250-50 MCG/DOSE AEPB Inhale 1 puff into the lungs 2 (two) times daily.  . furosemide (LASIX) 20 MG tablet Take 1 tablet (20 mg total) by mouth 2 (two) times daily as needed.  . gabapentin (NEURONTIN) 100 MG capsule Take 200 mg by mouth at bedtime.   [name] HYDROcodone-homatropine (HYCODAN) 5-1.5 MG/5ML syrup Take 5 mLs by mouth at bedtime.  . mometasone-formoterol (DULERA) 200-5 MCG/ACT AERO Inhale 2 puffs into the lungs 2 (two) times daily.  . mometasone-formoterol (DULERA) 200-5 MCG/ACT AERO Inhale  2 puffs into the lungs 2 (two) times daily.  . potassium chloride (K-DUR) 10 MEQ tablet Take 1 tablet (10  mEq total) by mouth daily.  . QUEtiapine (SEROQUEL XR) 300 MG 24 hr tablet Take 300 mg by mouth at bedtime.  [name] tiotropium (SPIRIVA) 18 MCG inhalation capsule Place 18 mcg into inhaler and inhale as needed.   [name] tiZANidine (ZANAFLEX) 4 MG tablet Take 4 mg by mouth every 8 (eight) hours as needed for muscle spasms.  [name] topiramate (TOPAMAX) 25 MG tablet Take 25 mg by mouth at bedtime.    No facility-administered encounter medications on file as of 06/27/2015.    Orders for this visit: Orders Placed This Encounter  Procedures  . Ambulatory referral to Infectious Disease    Referral Priority:  Routine    Referral Type:  Consultation    Referral Reason:  Specialty Services Required    Requested Specialty:  Infectious Diseases    Number of Visits Requested:  1    Thank  you for the visitation and for allowing  [facility] to assist in the care of your patient. Our recommendations are noted above.  Please contact us if we can be of further service.  Vilinda Boehringer, MD [facility] Office Number: [phone]

## 2015-06-27 NOTE — Assessment & Plan Note (Addendum)
Patient with persistent bilateral lower lobe fleeting lung infiltrates. She's been treated multiple times with multiple rounds of antibiotics. Bronchoscopy with BAL in July 2016 showed heavy growth Proteus and preliminary mold and Candida on the fungal cultures. Patient was treated for Proteus with 21 days of Cipro based on sensitivities. Final fungal and will cultures are currently pending.  Patient is still having cough with dyspnea on exertion and intermittent shortness of breath at rest.  Plan: - Completed treatment for Proteus and BAL for 21 days of Cipro -Preliminary fungal cultures were Candida and possible mold -Given exotic organisms and BAL along with possible fungal infection, will refer to infectious disease, Dr. Ola Spurr, for assistance in persistent lung infection treatment -Incentive spirometry 20-25 times per day -2 view chest x-ray prior to follow-up -Increase exercise tolerance -Patient educated again on use of rescue inhaler.

## 2015-07-04 ENCOUNTER — Encounter: Payer: Self-pay | Admitting: Emergency Medicine

## 2015-07-04 ENCOUNTER — Emergency Department
Admission: EM | Admit: 2015-07-04 | Discharge: 2015-07-04 | Disposition: A | Discharge status: Home / Self Care | Payer: No Typology Code available for payment source | Attending: Emergency Medicine | Admitting: Emergency Medicine

## 2015-07-04 ENCOUNTER — Emergency Department: Payer: No Typology Code available for payment source

## 2015-07-04 DIAGNOSIS — Y990 Civilian activity done for income or pay: Secondary | ICD-10-CM | POA: Diagnosis not present

## 2015-07-04 DIAGNOSIS — S93402A Sprain of unspecified ligament of left ankle, initial encounter: Secondary | ICD-10-CM | POA: Insufficient documentation

## 2015-07-04 DIAGNOSIS — I10 Essential (primary) hypertension: Secondary | ICD-10-CM | POA: Diagnosis not present

## 2015-07-04 DIAGNOSIS — Y9389 Activity, other specified: Secondary | ICD-10-CM | POA: Diagnosis not present

## 2015-07-04 DIAGNOSIS — S8001XA Contusion of right knee, initial encounter: Secondary | ICD-10-CM | POA: Insufficient documentation

## 2015-07-04 DIAGNOSIS — Z79899 Other long term (current) drug therapy: Secondary | ICD-10-CM | POA: Insufficient documentation

## 2015-07-04 DIAGNOSIS — Y9289 Other specified places as the place of occurrence of the external cause: Secondary | ICD-10-CM | POA: Diagnosis not present

## 2015-07-04 DIAGNOSIS — W1839XA Other fall on same level, initial encounter: Secondary | ICD-10-CM | POA: Insufficient documentation

## 2015-07-04 DIAGNOSIS — S99912A Unspecified injury of left ankle, initial encounter: Secondary | ICD-10-CM | POA: Diagnosis present

## 2015-07-04 MED ORDER — OXYCODONE-ACETAMINOPHEN 5-325 MG PO TABS: 1.0000 | ORAL_TABLET | ORAL | Status: DC | PRN

## 2015-07-04 MED ORDER — OXYCODONE-ACETAMINOPHEN 5-325 MG PO TABS
ORAL_TABLET | ORAL | Status: AC
  Filled 2015-07-04: qty 1

## 2015-07-04 MED ORDER — OXYCODONE-ACETAMINOPHEN 5-325 MG PO TABS
1.0000 | ORAL_TABLET | Freq: Once | ORAL | Status: AC
  Administered 2015-07-04: 1 via ORAL

## 2015-07-04 NOTE — Discharge Instructions (Signed)
Ankle Sprain  An ankle sprain is an injury to the strong, fibrous tissues (ligaments) that hold your ankle bones together.   HOME CARE   · Put ice on your ankle for 1-2 days or as told by your doctor.  ¨ Put ice in a plastic bag.  ¨ Place a towel between your skin and the bag.  ¨ Leave the ice on for 15-20 minutes at a time, every 2 hours while you are awake.  · Only take medicine as told by your doctor.  · Raise (elevate) your injured ankle above the level of your heart as much as possible for 2-3 days.  · Use crutches if your doctor tells you to. Slowly put your own weight on the affected ankle. Use the crutches until you can walk without pain.  · If you have a plaster splint:  ¨ Do not rest it on anything harder than a pillow for 24 hours.  ¨ Do not put weight on it.  ¨ Do not get it wet.  ¨ Take it off to shower or bathe.  · If given, use an elastic wrap or support stocking for support. Take the wrap off if your toes lose feeling (numb), tingle, or turn cold or blue.  · If you have an air splint:  ¨ Add or let out air to make it comfortable.  ¨ Take it off at night and to shower and bathe.  ¨ Wiggle your toes and move your ankle up and down often while you are wearing it.  GET HELP IF:  · You have rapidly increasing bruising or puffiness (swelling).  · Your toes feel very cold.  · You lose feeling in your foot.  · Your medicine does not help your pain.  GET HELP RIGHT AWAY IF:   · Your toes lose feeling (numb) or turn blue.  · You have severe pain that is increasing.  MAKE SURE YOU:   · Understand these instructions.  · Will watch your condition.  · Will get help right away if you are not doing well or get worse.  Document Released: 04/15/2008 Document Revised: 03/14/2014 Document Reviewed: 05/11/2012  [employer]® Patient Information ©2015 [employer]. This information is not intended to replace advice given to you by your health care provider. Make sure you discuss any questions you have with your health care  provider.

## 2015-07-04 NOTE — ED Notes (Signed)
States she fell last pm  Landed on right knee and twisted left ankle

## 2015-07-04 NOTE — ED Provider Notes (Signed)
[facility] Emergency Department Provider Note  ____________________________________________  Time seen: Approximately 2:20 PM  I have reviewed the triage vital signs and the nursing notes.   HISTORY  Chief Complaint Fall   HPI Melinda Shaw is a 49 y.o. female is here due to a fall that happened last night. Patient states that she tripped and this was a mechanical fall rather than a syncopal episode. She landed on her right knee which is hurting but most of all her left ankle is extremely swollen and painful. She is denies any previous injury to her ankle. She has not taken any over-the-counter medication prior to arrival.The pain scale is 8 out of 10.   Past Medical History  Diagnosis Date  . Asthma   . Hyperlipidemia   . Hypertension   . Shortness of breath dyspnea   . Anxiety   . Headache   . Fibromyalgia   . Anemia     H/O    Patient Active Problem List   Diagnosis Date Noted  . Lung infiltrate   . Pulmonary infiltrate in right lung on chest x-ray   . Pulmonary infiltrate in left lung on chest x-ray   . Reactive airway disease 03/21/2015  . Dyspnea 02/22/2015  . Cough 02/22/2015  . Acute upper respiratory infection 02/22/2015    Past Surgical History  Procedure Laterality Date  . Anterior cruciate ligament repair    . Cholecystectomy    . Knee surgery      left knee   . Tubal ligation    . Nose surgery    . Polypectomy      Current Outpatient Rx  Name  Route  Sig  Dispense  Refill  . albuterol (PROVENTIL HFA;VENTOLIN HFA) 108 (90 BASE) MCG/ACT inhaler   Inhalation   Inhale 2 puffs into the lungs every 6 (six) hours as needed for wheezing or shortness of breath.         . budesonide-formoterol (SYMBICORT) 160-4.5 MCG/ACT inhaler   Inhalation   Inhale 2 puffs into the lungs 2 (two) times daily.   1 Inhaler   3   . citalopram (CELEXA) 20 MG tablet   Oral   Take 20 mg by mouth every morning.          . clonazePAM  (KLONOPIN) 1 MG tablet   Oral   Take 1 mg by mouth 3 (three) times daily.         [name] diltiazem (TIAZAC) 120 MG 24 hr capsule   Oral   Take 120 mg by mouth daily.         [name] eletriptan (RELPAX) 20 MG tablet   Oral   Take 20 mg by mouth every 2 (two) hours as needed.          . Fluticasone-Salmeterol (ADVAIR) 250-50 MCG/DOSE AEPB   Inhalation   Inhale 1 puff into the lungs 2 (two) times daily.         . furosemide (LASIX) 20 MG tablet   Oral   Take 1 tablet (20 mg total) by mouth 2 (two) times daily as needed.   180 tablet   4   . gabapentin (NEURONTIN) 100 MG capsule   Oral   Take 200 mg by mouth at bedtime.          . mometasone-formoterol (DULERA) 200-5 MCG/ACT AERO   Inhalation   Inhale 2 puffs into the lungs 2 (two) times daily.   1 Inhaler   3   .  oxyCODONE-acetaminophen (PERCOCET) 5-325 MG per tablet   Oral   Take 1 tablet by mouth every 4 (four) hours as needed for severe pain.   20 tablet   0   . potassium chloride (K-DUR) 10 MEQ tablet   Oral   Take 1 tablet (10 mEq total) by mouth daily.   30 tablet   6   . QUEtiapine (SEROQUEL XR) 300 MG 24 hr tablet   Oral   Take 300 mg by mouth at bedtime.         [name] tiotropium (SPIRIVA) 18 MCG inhalation capsule   Inhalation   Place 18 mcg into inhaler and inhale as needed.          [name] tiZANidine (ZANAFLEX) 4 MG tablet   Oral   Take 4 mg by mouth every 8 (eight) hours as needed for muscle spasms.         [name] topiramate (TOPAMAX) 25 MG tablet   Oral   Take 25 mg by mouth at bedtime.            Allergies Cherry; Shellfish allergy; Sulfa antibiotics; and Bee venom  Family History  Problem Relation Age of Onset  . COPD Mother   . Heart Problems Mother   . High Cholesterol Mother   . Hypertension Father   . AAA (abdominal aortic aneurysm) Sister   . COPD Sister   . High Cholesterol Sister     Social History Social History  Substance Use Topics  . Smoking status: Never Smoker   .  Smokeless tobacco: Never Used  . Alcohol Use: No    Review of Systems Constitutional: No fever/chills Eyes: No visual changes. ENT: No sore throat. Cardiovascular: Denies chest pain. Respiratory: Denies shortness of breath. Gastrointestinal: No abdominal pain.  No nausea, no vomiting. Genitourinary: Negative for dysuria. Musculoskeletal: Negative for back pain. Skin: Negative for rash. Neurological: Negative for headaches, focal weakness or numbness.  10-point ROS otherwise negative.  ____________________________________________   PHYSICAL EXAM:  VITAL SIGNS: ED Triage Vitals  Enc Vitals Group     BP --      Pulse --      Resp --      Temp --      Temp src --      SpO2 --      Weight --      Height --      Head Cir --      Peak Flow --      Pain Score --      Pain Loc --      Pain Edu? --      Excl. in [place]? --     Constitutional: Alert and oriented. Well appearing and in no acute distress. Eyes: Conjunctivae are normal. PERRL. EOMI. Head: Atraumatic. Nose: No congestion/rhinnorhea. Neck: No stridor.   Cardiovascular: Normal rate, regular rhythm. Grossly normal heart sounds.  Good peripheral circulation. Respiratory: Normal respiratory effort.  No retractions. Lungs CTAB. Gastrointestinal: Soft and nontender. No distention.  Musculoskeletal: Moderate tenderness of the left lateral ankle with moderate edema. Range of motion is restricted secondary to pain. Motor sensory function intact. Left anterior knee lateral aspect moderate tenderness with minimal edema. Range of motion is restricted secondary to pain. No abrasions or ecchymosis to this area also. No joint effusions.  Neurologic:  Normal speech and language. No gross focal neurologic deficits are appreciated. No gait instability. Skin:  Skin is warm, dry and intact. No ecchymosis or abrasions were noted. Psychiatric:  Mood and affect are normal. Speech and behavior are  normal.  ____________________________________________   LABS (all labs ordered are listed, but only abnormal results are displayed)  Labs Reviewed - No data to display  RADIOLOGY  Right knee per radiologist and reviewed by me as negative for fracture. Left ankle per radiologist and reviewed by me showed no fracture I, Johnn Hai, personally viewed and evaluated these images (plain radiographs) as part of my medical decision making.  ____________________________________________   PROCEDURES  Procedure(s) performed: None  Critical Care performed: No  ____________________________________________   INITIAL IMPRESSION / ASSESSMENT AND PLAN / ED COURSE  Pertinent labs & imaging results that were available during my care of the patient were reviewed by me and considered in my medical decision making (see chart for details).  Patient was placed in a ankle stirrup splint and given crutches. She is also given a prescription for Percocet as needed for pain. She is to ice and elevate her ankle as needed for swelling. She will follow-up with orthopedist if no improvement in one week. ____________________________________________   FINAL CLINICAL IMPRESSION(S) / ED DIAGNOSES  Final diagnoses:  Sprain of ankle, left, initial encounter  Contusion, knee, right, initial encounter      Johnn Hai, PA-C 07/04/15 [place], MD 07/04/15 [phone]

## 2015-07-12 LAB — ACID FAST SMEAR+CULTURE W/RFLX (ARMC ONLY)
Acid Fast Culture: NEGATIVE
Acid Fast Culture: NEGATIVE
Acid Fast Smear: NEGATIVE
Acid Fast Smear: NEGATIVE

## 2015-07-21 ENCOUNTER — Encounter: Payer: Self-pay | Admitting: Internal Medicine

## 2015-07-24 ENCOUNTER — Telehealth: Payer: Self-pay

## 2015-07-24 NOTE — Telephone Encounter (Signed)
Received records request from Law office of Laverda Page , forwarded to [facility] for processing.

## 2015-08-16 DIAGNOSIS — R1310 Dysphagia, unspecified: Secondary | ICD-10-CM | POA: Insufficient documentation

## 2015-08-16 DIAGNOSIS — J479 Bronchiectasis, uncomplicated: Secondary | ICD-10-CM | POA: Insufficient documentation

## 2015-08-25 ENCOUNTER — Encounter: Payer: Self-pay | Admitting: *Deleted

## 2015-08-28 ENCOUNTER — Ambulatory Visit: Payer: No Typology Code available for payment source | Admitting: Anesthesiology

## 2015-08-28 ENCOUNTER — Ambulatory Visit
Admission: RE | Admit: 2015-08-28 | Discharge: 2015-08-28 | Disposition: A | Discharge status: Home / Self Care | Payer: No Typology Code available for payment source | Source: Ambulatory Visit | Attending: Gastroenterology | Admitting: Gastroenterology

## 2015-08-28 ENCOUNTER — Encounter
Admission: RE | Disposition: A | Discharge status: Home / Self Care | Payer: Self-pay | Source: Ambulatory Visit | Attending: Gastroenterology

## 2015-08-28 DIAGNOSIS — Z91018 Allergy to other foods: Secondary | ICD-10-CM | POA: Diagnosis not present

## 2015-08-28 DIAGNOSIS — F329 Major depressive disorder, single episode, unspecified: Secondary | ICD-10-CM | POA: Diagnosis not present

## 2015-08-28 DIAGNOSIS — M797 Fibromyalgia: Secondary | ICD-10-CM | POA: Diagnosis not present

## 2015-08-28 DIAGNOSIS — I10 Essential (primary) hypertension: Secondary | ICD-10-CM | POA: Diagnosis not present

## 2015-08-28 DIAGNOSIS — J45909 Unspecified asthma, uncomplicated: Secondary | ICD-10-CM | POA: Diagnosis not present

## 2015-08-28 DIAGNOSIS — E785 Hyperlipidemia, unspecified: Secondary | ICD-10-CM | POA: Diagnosis not present

## 2015-08-28 DIAGNOSIS — R1310 Dysphagia, unspecified: Secondary | ICD-10-CM | POA: Insufficient documentation

## 2015-08-28 DIAGNOSIS — Z882 Allergy status to sulfonamides status: Secondary | ICD-10-CM | POA: Insufficient documentation

## 2015-08-28 DIAGNOSIS — Z91030 Bee allergy status: Secondary | ICD-10-CM | POA: Insufficient documentation

## 2015-08-28 DIAGNOSIS — F419 Anxiety disorder, unspecified: Secondary | ICD-10-CM | POA: Insufficient documentation

## 2015-08-28 DIAGNOSIS — K210 Gastro-esophageal reflux disease with esophagitis: Secondary | ICD-10-CM | POA: Insufficient documentation

## 2015-08-28 DIAGNOSIS — Z91013 Allergy to seafood: Secondary | ICD-10-CM | POA: Insufficient documentation

## 2015-08-28 DIAGNOSIS — J449 Chronic obstructive pulmonary disease, unspecified: Secondary | ICD-10-CM | POA: Diagnosis not present

## 2015-08-28 HISTORY — DX: Depression, unspecified: F32.A

## 2015-08-28 HISTORY — DX: Chronic obstructive pulmonary disease, unspecified: J44.9

## 2015-08-28 HISTORY — DX: Major depressive disorder, single episode, unspecified: F32.9

## 2015-08-28 HISTORY — PX: ESOPHAGOGASTRODUODENOSCOPY (EGD) WITH PROPOFOL: SHX5813

## 2015-08-28 SURGERY — ESOPHAGOGASTRODUODENOSCOPY (EGD) WITH PROPOFOL
Anesthesia: General

## 2015-08-28 MED ORDER — SODIUM CHLORIDE 0.9 % IV SOLN
INTRAVENOUS | Status: DC
  Administered 2015-08-28: 1000 mL via INTRAVENOUS

## 2015-08-28 MED ORDER — FENTANYL CITRATE (PF) 100 MCG/2ML IJ SOLN
INTRAMUSCULAR | Status: DC | PRN
  Administered 2015-08-28: 50 ug via INTRAVENOUS

## 2015-08-28 MED ORDER — MIDAZOLAM HCL 2 MG/2ML IJ SOLN
INTRAMUSCULAR | Status: DC | PRN
  Administered 2015-08-28: 2 mg via INTRAVENOUS

## 2015-08-28 MED ORDER — GLYCOPYRROLATE 0.2 MG/ML IJ SOLN
INTRAMUSCULAR | Status: DC | PRN
  Administered 2015-08-28: 0.3 mg via INTRAVENOUS

## 2015-08-28 MED ORDER — PROPOFOL 500 MG/50ML IV EMUL
INTRAVENOUS | Status: DC | PRN
  Administered 2015-08-28: 180 ug/kg/min via INTRAVENOUS

## 2015-08-28 MED ORDER — PROPOFOL 10 MG/ML IV BOLUS
INTRAVENOUS | Status: DC | PRN
  Administered 2015-08-28: 40 mg via INTRAVENOUS

## 2015-08-28 MED ORDER — LIDOCAINE HCL (CARDIAC) 20 MG/ML IV SOLN
INTRAVENOUS | Status: DC | PRN
  Administered 2015-08-28: 100 mg via INTRAVENOUS

## 2015-08-28 NOTE — Op Note (Signed)
[facility] Gastroenterology Patient Name: Melinda Shaw Procedure Date: 08/28/2015 10:00 AM MRN: [id number] Account #: [id number] Date of Birth: 11-11-1966 Admit Type: Outpatient Age: 49 Room: [facility] ENDO ROOM 1 Gender: Female Note Status: Finalized Procedure:         Upper GI endoscopy Indications:       Dysphagia, , Possible aspiration Pna Patient Profile:   This is a 49 year old female. Providers:         Gerrit Heck. Rayann Heman, MD Referring MD:      Youlanda Roys. Ola Spurr, MD (Referring MD) Medicines:         Propofol per Anesthesia Complications:     No immediate complications. Procedure:         Pre-Anesthesia Assessment:                    - Prior to the procedure, a History and Physical was                     performed, and patient medications, allergies and                     sensitivities were reviewed. The patient's tolerance of                     previous anesthesia was reviewed.                    After obtaining informed consent, the endoscope was passed                     under direct vision. Throughout the procedure, the                     patient's blood pressure, pulse, and oxygen saturations                     were monitored continuously. The Endoscope was introduced                     through the mouth, and advanced to the second part of                     duodenum. The upper GI endoscopy was accomplished without                     difficulty. The patient tolerated the procedure well. Findings:      LA Grade C (one or more mucosal breaks continuous between tops of 2 or       more mucosal folds, less than 75% circumference) esophagitis with no       bleeding was found in the lower third of the esophagus. Biopsies were       taken with a cold forceps for histology.      The stomach was normal.      The examined duodenum was normal. Impression:        - LA Grade C reflux esophagitis. Biopsied.                    - Normal stomach.       - Normal examined duodenum. Recommendation:    - Observe patient in GI recovery unit.                    - Resume regular diet.                    -  Continue present medications.                    - Use Prilosec (omeprazole) 40 mg PO daily.                    - Await pathology results.                    - Return to GI clinic.                    - The findings and recommendations were discussed with the                     patient.                    - The findings and recommendations were discussed with the                     patient's family. Procedure Code(s): --- Professional ---                    [id number], Esophagogastroduodenoscopy, flexible, transoral;                     with biopsy, single or multiple CPT copyright 2014 American Medical Association. All rights reserved. The codes documented in this report are preliminary and upon coder review may  be revised to meet current compliance requirements. Mellody Life, MD 08/28/2015 10:20:36 AM This report has been signed electronically. Number of Addenda: 0 Note Initiated On: 08/28/2015 10:00 AM      [facility]

## 2015-08-28 NOTE — Anesthesia Preprocedure Evaluation (Signed)
Anesthesia Evaluation  Patient identified by MRN, date of birth, ID band Patient awake    Reviewed: Allergy & Precautions, NPO status , Patient's Chart, lab work & pertinent test results  History of Anesthesia Complications (+) history of anesthetic complications  Airway Mallampati: III       Dental  (+) Teeth Intact   Pulmonary asthma , COPD,  COPD inhaler,           Cardiovascular hypertension, Pt. on medications      Neuro/Psych Anxiety Depression    GI/Hepatic negative GI ROS, Neg liver ROS, GERD  Poorly Controlled,  Endo/Other  negative endocrine ROS  Renal/GU negative Renal ROS     Musculoskeletal  (+) Fibromyalgia -, narcotic dependent  Abdominal   Peds  Hematology  (+) anemia ,   Anesthesia Other Findings   Reproductive/Obstetrics                             Anesthesia Physical Anesthesia Plan  ASA: III  Anesthesia Plan: General   Post-op Pain Management:    Induction: Intravenous  Airway Management Planned: Nasal Cannula  Additional Equipment:   Intra-op Plan:   Post-operative Plan:   Informed Consent: I have reviewed the patients History and Physical, chart, labs and discussed the procedure including the risks, benefits and alternatives for the proposed anesthesia with the patient or authorized representative who has indicated his/her understanding and acceptance.     Plan Discussed with:   Anesthesia Plan Comments:         Anesthesia Quick Evaluation

## 2015-08-28 NOTE — Transfer of Care (Signed)
Immediate Anesthesia Transfer of Care Note  Patient: Melinda Shaw  Procedure(s) Performed: Procedure(s): ESOPHAGOGASTRODUODENOSCOPY (EGD) WITH PROPOFOL (N/A)  Patient Location: PACU  Anesthesia Type:General  Level of Consciousness: awake, alert  and oriented  Airway & Oxygen Therapy: Patient Spontanous Breathing and Patient connected to nasal cannula oxygen  Post-op Assessment: Report given to RN and Post -op Vital signs reviewed and stable  Post vital signs: stable  Last Vitals:  Filed Vitals:   08/28/15 1022  BP: 114/67  Pulse: 100  Temp: 36.4 C  Resp: 17    Complications: No apparent anesthesia complications

## 2015-08-28 NOTE — H&P (Signed)
Primary Care Physician:  Rafael Bihari, MD  Pre-Procedure History & Physical: HPI:  Melinda Shaw is a 48 y.o. female is here for an endoscopy.   Past Medical History  Diagnosis Date  . Asthma   . Hyperlipidemia   . Hypertension   . Shortness of breath dyspnea   . Anxiety   . Headache   . Fibromyalgia   . Anemia     H/O  . COPD (chronic obstructive pulmonary disease) ([place])   . Depression     Past Surgical History  Procedure Laterality Date  . Anterior cruciate ligament repair    . Cholecystectomy    . Knee surgery      left knee   . Tubal ligation    . Nose surgery    . Polypectomy    . Video bronchoscopy N/A 05/29/2015    Procedure: VIDEO BRONCHOSCOPY WITHOUT FLUORO;  Surgeon: Vilinda Boehringer, MD;  Location: [facility] ORS;  Service: Cardiopulmonary;  Laterality: N/A;    Prior to Admission medications   Medication Sig Start Date End Date Taking? Authorizing Provider  ciprofloxacin (CIPRO) 500 MG tablet Take 500 mg by mouth 2 (two) times daily.   Yes Historical Provider, MD  citalopram (CELEXA) 20 MG tablet Take 20 mg by mouth every morning.    Yes Historical Provider, MD  clonazePAM (KLONOPIN) 1 MG tablet Take 1 mg by mouth 3 (three) times daily.   Yes Historical Provider, MD  guaiFENesin-codeine 100-10 MG/5ML syrup Take 5 mLs by mouth at bedtime as needed for cough.   Yes Historical Provider, MD  potassium chloride (K-DUR) 10 MEQ tablet Take 1 tablet (10 mEq total) by mouth daily. 03/21/15  Yes Minna Merritts, MD  SUMAtriptan (IMITREX) 25 MG tablet Take 25 mg by mouth every 2 (two) hours as needed for migraine. May repeat in 2 hours if headache persists or recurs.   Yes Historical Provider, MD  albuterol (PROVENTIL HFA;VENTOLIN HFA) 108 (90 BASE) MCG/ACT inhaler Inhale 2 puffs into the lungs every 6 (six) hours as needed for wheezing or shortness of breath.    Historical Provider, MD  budesonide-formoterol (SYMBICORT) 160-4.5 MCG/ACT inhaler Inhale 2 puffs into  the lungs 2 (two) times daily. 05/23/15   Vishal Mungal, MD  diltiazem (TIAZAC) 120 MG 24 hr capsule Take 120 mg by mouth daily.    Historical Provider, MD  eletriptan (RELPAX) 20 MG tablet Take 20 mg by mouth every 2 (two) hours as needed.     Historical Provider, MD  Fluticasone-Salmeterol (ADVAIR) 250-50 MCG/DOSE AEPB Inhale 1 puff into the lungs 2 (two) times daily.    Historical Provider, MD  furosemide (LASIX) 20 MG tablet Take 1 tablet (20 mg total) by mouth 2 (two) times daily as needed. 03/29/15   Minna Merritts, MD  gabapentin (NEURONTIN) 100 MG capsule Take 200 mg by mouth at bedtime.     Historical Provider, MD  mometasone-formoterol (DULERA) 200-5 MCG/ACT AERO Inhale 2 puffs into the lungs 2 (two) times daily. 05/29/15   Vishal Mungal, MD  oxyCODONE-acetaminophen (PERCOCET) 5-325 MG per tablet Take 1 tablet by mouth every 4 (four) hours as needed for severe pain. 07/04/15   Johnn Hai, PA-C  QUEtiapine (SEROQUEL XR) 300 MG 24 hr tablet Take 300 mg by mouth at bedtime.    Historical Provider, MD  tiotropium (SPIRIVA) 18 MCG inhalation capsule Place 18 mcg into inhaler and inhale as needed.     Historical Provider, MD  tiZANidine (ZANAFLEX) 4 MG tablet  Take 4 mg by mouth every 8 (eight) hours as needed for muscle spasms.    Historical Provider, MD  topiramate (TOPAMAX) 25 MG tablet Take 25 mg by mouth at bedtime.  02/27/15   Historical Provider, MD    Allergies as of 08/23/2015 - Review Complete 07/04/2015  Allergen Reaction Noted  . Cherry Swelling 02/22/2015  . Shellfish allergy Shortness Of Breath 05/25/2015  . Sulfa antibiotics Shortness Of Breath 02/22/2015  . Bee venom  02/22/2015    Family History  Problem Relation Age of Onset  . COPD Mother   . Heart Problems Mother   . High Cholesterol Mother   . Hypertension Father   . AAA (abdominal aortic aneurysm) Sister   . COPD Sister   . High Cholesterol Sister     Social History   Social History  . Marital Status:  Married    Spouse Name: N/A  . Number of Children: N/A  . Years of Education: N/A   Occupational History  . Not on file.   Social History Main Topics  . Smoking status: Never Smoker   . Smokeless tobacco: Never Used  . Alcohol Use: No  . Drug Use: No  . Sexual Activity: Yes   Other Topics Concern  . Not on file   Social History Narrative     Physical Exam: BP 137/88 mmHg  Pulse 88  Temp(Src) 97.4 F (36.3 C) (Tympanic)  Ht 5\' 2"  (1.575 m)  Wt 86.183 kg (190 lb)  BMI 34.74 kg/m2  SpO2 98% General:   Alert,  pleasant and cooperative in NAD Head:  Normocephalic and atraumatic. Neck:  Supple; no masses or thyromegaly. Lungs:  Clear throughout to auscultation.    Heart:  Regular rate and rhythm. Abdomen:  Soft, nontender and nondistended. Normal bowel sounds, without guarding, and without rebound.   Neurologic:  Alert and  oriented x4;  grossly normal neurologically.  Impression/Plan: Melinda Shaw is here for an endoscopy to be performed for dysphagia, possible aspiration  Risks, benefits, limitations, and alternatives regarding  endoscopy have been reviewed with the patient.  Questions have been answered.  All parties agreeable.   Josefine Class, MD  08/28/2015, 9:55 AM

## 2015-08-28 NOTE — Anesthesia Postprocedure Evaluation (Signed)
  Anesthesia Post-op Note  Patient: Melinda Shaw  Procedure(s) Performed: Procedure(s): ESOPHAGOGASTRODUODENOSCOPY (EGD) WITH PROPOFOL (N/A)  Anesthesia type:General  Patient location: PACU  Post pain: Pain level controlled  Post assessment: Post-op Vital signs reviewed, Patient's Cardiovascular Status Stable, Respiratory Function Stable, Patent Airway and No signs of Nausea or vomiting  Post vital signs: Reviewed and stable  Last Vitals:  Filed Vitals:   08/28/15 1100  BP: 122/87  Pulse: 87  Temp:   Resp: 14    Level of consciousness: awake, alert  and patient cooperative  Complications: No apparent anesthesia complications

## 2015-08-29 ENCOUNTER — Emergency Department: Payer: No Typology Code available for payment source

## 2015-08-29 ENCOUNTER — Encounter: Payer: Self-pay | Admitting: *Deleted

## 2015-08-29 ENCOUNTER — Emergency Department
Admission: EM | Admit: 2015-08-29 | Discharge: 2015-08-29 | Disposition: A | Discharge status: Home / Self Care | Payer: No Typology Code available for payment source | Attending: Emergency Medicine | Admitting: Emergency Medicine

## 2015-08-29 DIAGNOSIS — Y998 Other external cause status: Secondary | ICD-10-CM | POA: Diagnosis not present

## 2015-08-29 DIAGNOSIS — S93402A Sprain of unspecified ligament of left ankle, initial encounter: Secondary | ICD-10-CM | POA: Diagnosis not present

## 2015-08-29 DIAGNOSIS — Y9389 Activity, other specified: Secondary | ICD-10-CM | POA: Insufficient documentation

## 2015-08-29 DIAGNOSIS — S99912A Unspecified injury of left ankle, initial encounter: Secondary | ICD-10-CM | POA: Diagnosis present

## 2015-08-29 DIAGNOSIS — W01198A Fall on same level from slipping, tripping and stumbling with subsequent striking against other object, initial encounter: Secondary | ICD-10-CM | POA: Insufficient documentation

## 2015-08-29 DIAGNOSIS — S0083XA Contusion of other part of head, initial encounter: Secondary | ICD-10-CM | POA: Diagnosis not present

## 2015-08-29 DIAGNOSIS — S0093XA Contusion of unspecified part of head, initial encounter: Secondary | ICD-10-CM

## 2015-08-29 DIAGNOSIS — Y9289 Other specified places as the place of occurrence of the external cause: Secondary | ICD-10-CM | POA: Diagnosis not present

## 2015-08-29 DIAGNOSIS — Z79899 Other long term (current) drug therapy: Secondary | ICD-10-CM | POA: Insufficient documentation

## 2015-08-29 DIAGNOSIS — Z792 Long term (current) use of antibiotics: Secondary | ICD-10-CM | POA: Insufficient documentation

## 2015-08-29 DIAGNOSIS — F419 Anxiety disorder, unspecified: Secondary | ICD-10-CM | POA: Insufficient documentation

## 2015-08-29 DIAGNOSIS — F329 Major depressive disorder, single episode, unspecified: Secondary | ICD-10-CM | POA: Insufficient documentation

## 2015-08-29 DIAGNOSIS — I10 Essential (primary) hypertension: Secondary | ICD-10-CM | POA: Diagnosis not present

## 2015-08-29 DIAGNOSIS — Z7951 Long term (current) use of inhaled steroids: Secondary | ICD-10-CM | POA: Diagnosis not present

## 2015-08-29 DIAGNOSIS — E785 Hyperlipidemia, unspecified: Secondary | ICD-10-CM | POA: Diagnosis not present

## 2015-08-29 LAB — SURGICAL PATHOLOGY

## 2015-08-29 MED ORDER — OXYCODONE-ACETAMINOPHEN 7.5-325 MG PO TABS: 1.0000 | ORAL_TABLET | Freq: Four times a day (QID) | ORAL | Status: DC | PRN

## 2015-08-29 MED ORDER — OXYCODONE-ACETAMINOPHEN 5-325 MG PO TABS
1.0000 | ORAL_TABLET | Freq: Once | ORAL | Status: AC
  Administered 2015-08-29: 1 via ORAL
  Filled 2015-08-29: qty 1

## 2015-08-29 MED ORDER — IBUPROFEN 800 MG PO TABS
800.0000 mg | ORAL_TABLET | Freq: Once | ORAL | Status: AC
  Administered 2015-08-29: 800 mg via ORAL
  Filled 2015-08-29: qty 1

## 2015-08-29 MED ORDER — IBUPROFEN 800 MG PO TABS: 800.0000 mg | ORAL_TABLET | Freq: Three times a day (TID) | ORAL | Status: DC | PRN

## 2015-08-29 NOTE — Discharge Instructions (Signed)
Ankle Sprain °An ankle sprain is an injury to the strong, fibrous tissues (ligaments) that hold your ankle bones together.  °HOME CARE  °· Put ice on your ankle for 1-2 days or as told by your doctor. °¨ Put ice in a plastic bag. °¨ Place a towel between your skin and the bag. °¨ Leave the ice on for 15-20 minutes at a time, every 2 hours while you are awake. °· Only take medicine as told by your doctor. °· Raise (elevate) your injured ankle above the level of your heart as much as possible for 2-3 days. °· Use crutches if your doctor tells you to. Slowly put your own weight on the affected ankle. Use the crutches until you can walk without pain. °· If you have a plaster splint: °¨ Do not rest it on anything harder than a pillow for 24 hours. °¨ Do not put weight on it. °¨ Do not get it wet. °¨ Take it off to shower or bathe. °· If given, use an elastic wrap or support stocking for support. Take the wrap off if your toes lose feeling (numb), tingle, or turn cold or blue. °· If you have an air splint: °¨ Add or let out air to make it comfortable. °¨ Take it off at night and to shower and bathe. °¨ Wiggle your toes and move your ankle up and down often while you are wearing it. °GET HELP IF: °· You have rapidly increasing bruising or puffiness (swelling). °· Your toes feel very cold. °· You lose feeling in your foot. °· Your medicine does not help your pain. °GET HELP RIGHT AWAY IF:  °· Your toes lose feeling (numb) or turn blue. °· You have severe pain that is increasing. °MAKE SURE YOU:  °· Understand these instructions. °· Will watch your condition. °· Will get help right away if you are not doing well or get worse. °  °This information is not intended to replace advice given to you by your health care provider. Make sure you discuss any questions you have with your health care provider. °  °Document Released: 04/15/2008 Document Revised: 11/18/2014 Document Reviewed: 05/11/2012 °Elsevier Interactive Patient  Education ©2016 [employer] ° °

## 2015-08-29 NOTE — ED Notes (Signed)
Pt reports fall yesterday, pain to left ankle and headache. States hit head, but no LOC. Headache today. Pt reports had endoscopy yesterday with biopsy taken.

## 2015-08-29 NOTE — ED Provider Notes (Signed)
[facility] Emergency Department Provider Note  ____________________________________________  Time seen: Approximately 5:45 PM  I have reviewed the triage vital signs and the nursing notes.   HISTORY  Chief Complaint Fall    HPI Melinda Shaw is a 49 y.o. female patient complained of headache and left ankle pain and edema secondary to a fall yesterday. Patient state she missed a step and rolled her ankle causing him to lose balance and fall. Patient struck the right side of her forehead against a fireplace. Patient denies any loss of consciousness. Patient states her pain to her ankle as 8/10. Increased pain with ambulation. No palliative measures taken for this complaint.   Past Medical History  Diagnosis Date  . Asthma   . Hyperlipidemia   . Hypertension   . Shortness of breath dyspnea   . Anxiety   . Headache   . Fibromyalgia   . Anemia     H/O  . COPD (chronic obstructive pulmonary disease) ([place])   . Depression     Patient Active Problem List   Diagnosis Date Noted  . Lung infiltrate   . Pulmonary infiltrate in right lung on chest x-ray   . Pulmonary infiltrate in left lung on chest x-ray   . Reactive airway disease 03/21/2015  . Dyspnea 02/22/2015  . Cough 02/22/2015  . Acute upper respiratory infection 02/22/2015    Past Surgical History  Procedure Laterality Date  . Anterior cruciate ligament repair    . Cholecystectomy    . Knee surgery      left knee   . Tubal ligation    . Nose surgery    . Polypectomy    . Video bronchoscopy N/A 05/29/2015    Procedure: VIDEO BRONCHOSCOPY WITHOUT FLUORO;  Surgeon: Vilinda Boehringer, MD;  Location: [facility] ORS;  Service: Cardiopulmonary;  Laterality: N/A;    Current Outpatient Rx  Name  Route  Sig  Dispense  Refill  . albuterol (PROVENTIL HFA;VENTOLIN HFA) 108 (90 BASE) MCG/ACT inhaler   Inhalation   Inhale 2 puffs into the lungs every 6 (six) hours as needed for wheezing or shortness of  breath.         . budesonide-formoterol (SYMBICORT) 160-4.5 MCG/ACT inhaler   Inhalation   Inhale 2 puffs into the lungs 2 (two) times daily.   1 Inhaler   3   . ciprofloxacin (CIPRO) 500 MG tablet   Oral   Take 500 mg by mouth 2 (two) times daily.         . citalopram (CELEXA) 20 MG tablet   Oral   Take 20 mg by mouth every morning.          . clonazePAM (KLONOPIN) 1 MG tablet   Oral   Take 1 mg by mouth 3 (three) times daily.         [name] diltiazem (TIAZAC) 120 MG 24 hr capsule   Oral   Take 120 mg by mouth daily.         [name] eletriptan (RELPAX) 20 MG tablet   Oral   Take 20 mg by mouth every 2 (two) hours as needed.          . Fluticasone-Salmeterol (ADVAIR) 250-50 MCG/DOSE AEPB   Inhalation   Inhale 1 puff into the lungs 2 (two) times daily.         . furosemide (LASIX) 20 MG tablet   Oral   Take 1 tablet (20 mg total) by mouth 2 (two) times daily as  needed.   180 tablet   4   . gabapentin (NEURONTIN) 100 MG capsule   Oral   Take 200 mg by mouth at bedtime.          [name] guaiFENesin-codeine 100-10 MG/5ML syrup   Oral   Take 5 mLs by mouth at bedtime as needed for cough.         [name] ibuprofen (ADVIL,MOTRIN) 800 MG tablet   Oral   Take 1 tablet (800 mg total) by mouth every 8 (eight) hours as needed for moderate pain.   15 tablet   0   . mometasone-formoterol (DULERA) 200-5 MCG/ACT AERO   Inhalation   Inhale 2 puffs into the lungs 2 (two) times daily.   1 Inhaler   3   . oxyCODONE-acetaminophen (PERCOCET) 5-325 MG per tablet   Oral   Take 1 tablet by mouth every 4 (four) hours as needed for severe pain.   20 tablet   0   . oxyCODONE-acetaminophen (PERCOCET) 7.5-325 MG tablet   Oral   Take 1 tablet by mouth every 6 (six) hours as needed for severe pain.   12 tablet   0   . potassium chloride (K-DUR) 10 MEQ tablet   Oral   Take 1 tablet (10 mEq total) by mouth daily.   30 tablet   6   . QUEtiapine (SEROQUEL XR) 300 MG 24 hr tablet    Oral   Take 300 mg by mouth at bedtime.         . SUMAtriptan (IMITREX) 25 MG tablet   Oral   Take 25 mg by mouth every 2 (two) hours as needed for migraine. May repeat in 2 hours if headache persists or recurs.         [name] tiotropium (SPIRIVA) 18 MCG inhalation capsule   Inhalation   Place 18 mcg into inhaler and inhale as needed.          [name] tiZANidine (ZANAFLEX) 4 MG tablet   Oral   Take 4 mg by mouth every 8 (eight) hours as needed for muscle spasms.         [name] topiramate (TOPAMAX) 25 MG tablet   Oral   Take 25 mg by mouth at bedtime.            Allergies Cherry; Shellfish allergy; Sulfa antibiotics; and Bee venom  Family History  Problem Relation Age of Onset  . COPD Mother   . Heart Problems Mother   . High Cholesterol Mother   . Hypertension Father   . AAA (abdominal aortic aneurysm) Sister   . COPD Sister   . High Cholesterol Sister     Social History Social History  Substance Use Topics  . Smoking status: Never Smoker   . Smokeless tobacco: Never Used  . Alcohol Use: No    Review of Systems Constitutional: No fever/chills Eyes: No visual changes. ENT: No sore throat. Cardiovascular: Denies chest pain. Respiratory: Denies shortness of breath. Gastrointestinal: No abdominal pain.  No nausea, no vomiting.  No diarrhea.  No constipation. Genitourinary: Negative for dysuria. Musculoskeletal: Left ankle pain and edema. Skin: Negative for rash. Neurological: Positive for headaches, but denies  focal weakness or numbness. Psychiatric:Anxiety and depression Endocrine:Hyperlipidemia hypertension Hematological/Lymphatic: Allergic/Immunilogical: See allergy list 10-point ROS otherwise negative.  ____________________________________________   PHYSICAL EXAM:  VITAL SIGNS: ED Triage Vitals  Enc Vitals Group     BP 08/29/15 1730 155/101 mmHg     Pulse Rate 08/29/15 1730 66  Resp 08/29/15 1730 16     Temp 08/29/15 1730 97.8 F (36.6 C)      Temp Source 08/29/15 1730 Oral     SpO2 08/29/15 1730 98 %     Weight 08/29/15 1730 186 lb (84.369 kg)     Height 08/29/15 1730 5\' 2"  (1.575 m)     Head Cir --      Peak Flow --      Pain Score 08/29/15 1729 8     Pain Loc --      Pain Edu? --      Excl. in [place]? --     Constitutional: Alert and oriented. Well appearing and in no acute distress. Eyes: Conjunctivae are normal. PERRL. EOMI. Head: Atraumatic. Nose: No congestion/rhinnorhea. Mouth/Throat: Mucous membranes are moist.  Oropharynx non-erythematous. Neck: No stridor.  No cervical spine tenderness to palpation. Hematological/Lymphatic/Immunilogical: No cervical lymphadenopathy. Cardiovascular: Normal rate, regular rhythm. Grossly normal heart sounds.  Good peripheral circulation. Elevated BP Respiratory: Normal respiratory effort.  No retractions. Lungs CTAB. Gastrointestinal: Soft and nontender. No distention. No abdominal bruits. No CVA tenderness. Musculoskeletal: No deformity to the left ankle. Obvious edema to the lateral malleolus. Moderate guarding palpation of the lateral malleolus. Decreased range of motion with inversion-type movements. Neurovascular intact. Neurologic:  Normal speech and language. No gross focal neurologic deficits are appreciated. No gait instability. Skin:  Skin is warm, dry and intact. No rash noted. Psychiatric: Mood and affect are normal. Speech and behavior are normal.  ____________________________________________   LABS (all labs ordered are listed, but only abnormal results are displayed)  Labs Reviewed - No data to display ____________________________________________   ____________________________________________  RADIOLOGY  Left ankle x-ray reveals no fracture. Soft tissue edema is apparent. Incidental finding of calcaneus and Achilles spur.  I, Sable Feil, personally viewed and evaluated these images (plain radiographs) as part of my medical decision making.    ____________________________________________   PROCEDURES  Procedure(s) performed: None  Critical Care performed: No  ____________________________________________   INITIAL IMPRESSION / ASSESSMENT AND PLAN / ED COURSE  Pertinent labs & imaging results that were available during my care of the patient were reviewed by me and considered in my medical decision making (see chart for details).  Head contusion and sprain left ankle. Patient placed in ankle stirrup splint and given crutches for ambulation. Patient given prescription for Percocet and ibuprofen. Patient advised to follow-up with PCP this no improvement return back to the clinic if her condition worsens. ____________________________________________   FINAL CLINICAL IMPRESSION(S) / ED DIAGNOSES  Final diagnoses:  Left ankle sprain, initial encounter  Head contusion, initial encounter      Sable Feil, PA-C 08/29/15 1833  Daymon Larsen, MD 08/30/15 1524

## 2015-08-30 ENCOUNTER — Encounter: Payer: Self-pay | Admitting: Gastroenterology

## 2015-09-27 ENCOUNTER — Other Ambulatory Visit: Payer: Self-pay | Admitting: Cardiovascular Disease

## 2015-10-06 ENCOUNTER — Other Ambulatory Visit: Payer: Self-pay | Admitting: Internal Medicine

## 2015-10-25 ENCOUNTER — Other Ambulatory Visit: Payer: Self-pay | Admitting: Cardiovascular Disease

## 2015-11-13 LAB — FUNGUS CULTURE W SMEAR
SPECIAL REQUESTS: NORMAL
Special Requests: NORMAL

## 2015-11-21 ENCOUNTER — Encounter: Payer: Self-pay | Admitting: *Deleted

## 2015-11-21 ENCOUNTER — Emergency Department: Payer: BLUE CROSS/BLUE SHIELD

## 2015-11-21 ENCOUNTER — Emergency Department
Admission: EM | Admit: 2015-11-21 | Discharge: 2015-11-21 | Disposition: A | Discharge status: Home / Self Care | Payer: BLUE CROSS/BLUE SHIELD | Attending: Emergency Medicine | Admitting: Emergency Medicine

## 2015-11-21 DIAGNOSIS — R079 Chest pain, unspecified: Secondary | ICD-10-CM

## 2015-11-21 DIAGNOSIS — R059 Cough, unspecified: Secondary | ICD-10-CM

## 2015-11-21 DIAGNOSIS — Z79899 Other long term (current) drug therapy: Secondary | ICD-10-CM | POA: Diagnosis not present

## 2015-11-21 DIAGNOSIS — R0602 Shortness of breath: Secondary | ICD-10-CM

## 2015-11-21 DIAGNOSIS — Z792 Long term (current) use of antibiotics: Secondary | ICD-10-CM | POA: Insufficient documentation

## 2015-11-21 DIAGNOSIS — R05 Cough: Secondary | ICD-10-CM

## 2015-11-21 DIAGNOSIS — Z7951 Long term (current) use of inhaled steroids: Secondary | ICD-10-CM | POA: Diagnosis not present

## 2015-11-21 DIAGNOSIS — J441 Chronic obstructive pulmonary disease with (acute) exacerbation: Secondary | ICD-10-CM | POA: Insufficient documentation

## 2015-11-21 DIAGNOSIS — I10 Essential (primary) hypertension: Secondary | ICD-10-CM | POA: Insufficient documentation

## 2015-11-21 DIAGNOSIS — R0789 Other chest pain: Secondary | ICD-10-CM | POA: Insufficient documentation

## 2015-11-21 LAB — COMPREHENSIVE METABOLIC PANEL
ALK PHOS: 110 U/L (ref 38–126)
ALT: 39 U/L (ref 14–54)
AST: 25 U/L (ref 15–41)
Albumin: 4.1 g/dL (ref 3.5–5.0)
Anion gap: 10 (ref 5–15)
BUN: 16 mg/dL (ref 6–20)
CO2: 24 mmol/L (ref 22–32)
CREATININE: 0.73 mg/dL (ref 0.44–1.00)
Calcium: 9.2 mg/dL (ref 8.9–10.3)
Chloride: 102 mmol/L (ref 101–111)
Glucose, Bld: 111 mg/dL — ABNORMAL HIGH (ref 65–99)
Potassium: 3.4 mmol/L — ABNORMAL LOW (ref 3.5–5.1)
Sodium: 136 mmol/L (ref 135–145)
Total Bilirubin: 0.8 mg/dL (ref 0.3–1.2)
Total Protein: 7.8 g/dL (ref 6.5–8.1)

## 2015-11-21 LAB — CBC
HCT: 38.6 % (ref 35.0–47.0)
HEMOGLOBIN: 12.9 g/dL (ref 12.0–16.0)
MCH: 30.1 pg (ref 26.0–34.0)
MCHC: 33.4 g/dL (ref 32.0–36.0)
MCV: 90.1 fL (ref 80.0–100.0)
Platelets: 224 10*3/uL (ref 150–440)
RBC: 4.28 MIL/uL (ref 3.80–5.20)
RDW: 13.6 % (ref 11.5–14.5)
WBC: 6.6 10*3/uL (ref 3.6–11.0)

## 2015-11-21 LAB — TROPONIN I: Troponin I: <0.03 ng/mL (ref <0.031)

## 2015-11-21 NOTE — Discharge Instructions (Signed)
Please drink plenty of fluid and stay well-hydrated. You may take Tylenol or Motrin for your pain.  Please return to the emergency department if you develop chest pain, shortness of breath, fever, lightheadedness or fainting, or any other symptoms concerning to you.

## 2015-11-21 NOTE — ED Notes (Signed)
Pt to ed with c/o chest pain that started last night and has been constant since.  Pt states " it hurts in my lungs on the right side"  Pt states the pain is sharpe, constant and causes her to be sob.

## 2015-11-21 NOTE — ED Provider Notes (Signed)
[facility] Emergency Department Provider Note  ____________________________________________  Time seen: Approximately 4:07 PM  I have reviewed the triage vital signs and the nursing notes.   HISTORY  Chief Complaint Chest Pain    HPI Melinda Shaw is a 50 y.o. female with a history of asthma and COPD not on exogenous oxygen presenting with 2 days of cough, shortness of breath, chest pain. Patient states that yesterday evening she was sitting when she developed a right-sided "burning" sensation in her chest which resolved. This does not feel similar to her reflux which has improved since she was started on Prilosec. This morning she got up and then developed a cough productive of green phlegm, shortness of breath and general malaise. She denies any rhinorrhea, sore throat, ear pain, nausea or vomiting, abdominal pain, lightheadedness, syncope, sick contacts.  Past Medical History  Diagnosis Date  . Asthma   . Hyperlipidemia   . Hypertension   . Shortness of breath dyspnea   . Anxiety   . Headache   . Fibromyalgia   . Anemia     H/O  . COPD (chronic obstructive pulmonary disease) ([place])   . Depression     Patient Active Problem List   Diagnosis Date Noted  . Lung infiltrate   . Pulmonary infiltrate in right lung on chest x-ray   . Pulmonary infiltrate in left lung on chest x-ray   . Reactive airway disease 03/21/2015  . Dyspnea 02/22/2015  . Cough 02/22/2015  . Acute upper respiratory infection 02/22/2015    Past Surgical History  Procedure Laterality Date  . Anterior cruciate ligament repair    . Cholecystectomy    . Knee surgery      left knee   . Tubal ligation    . Nose surgery    . Polypectomy    . Video bronchoscopy N/A 05/29/2015    Procedure: VIDEO BRONCHOSCOPY WITHOUT FLUORO;  Surgeon: Vilinda Boehringer, MD;  Location: [facility] ORS;  Service: Cardiopulmonary;  Laterality: N/A;  . Esophagogastroduodenoscopy (egd) with propofol N/A  08/28/2015    Procedure: ESOPHAGOGASTRODUODENOSCOPY (EGD) WITH PROPOFOL;  Surgeon: Josefine Class, MD;  Location: [facility] ENDOSCOPY;  Service: Endoscopy;  Laterality: N/A;    Current Outpatient Rx  Name  Route  Sig  Dispense  Refill  . albuterol (PROVENTIL HFA;VENTOLIN HFA) 108 (90 BASE) MCG/ACT inhaler   Inhalation   Inhale 2 puffs into the lungs every 6 (six) hours as needed for wheezing or shortness of breath.         . budesonide-formoterol (SYMBICORT) 160-4.5 MCG/ACT inhaler   Inhalation   Inhale 2 puffs into the lungs 2 (two) times daily.   1 Inhaler   3   . ciprofloxacin (CIPRO) 500 MG tablet   Oral   Take 500 mg by mouth 2 (two) times daily.         . citalopram (CELEXA) 20 MG tablet   Oral   Take 20 mg by mouth every morning.          . clonazePAM (KLONOPIN) 1 MG tablet   Oral   Take 1 mg by mouth 3 (three) times daily.         [name] diltiazem (TIAZAC) 120 MG 24 hr capsule   Oral   Take 120 mg by mouth daily.         [name] eletriptan (RELPAX) 20 MG tablet   Oral   Take 20 mg by mouth every 2 (two) hours as needed.          [name]  Fluticasone-Salmeterol (ADVAIR) 250-50 MCG/DOSE AEPB   Inhalation   Inhale 1 puff into the lungs 2 (two) times daily.         . furosemide (LASIX) 20 MG tablet   Oral   Take 1 tablet (20 mg total) by mouth 2 (two) times daily as needed.   180 tablet   4   . gabapentin (NEURONTIN) 100 MG capsule   Oral   Take 200 mg by mouth at bedtime.          [name] guaiFENesin-codeine 100-10 MG/5ML syrup   Oral   Take 5 mLs by mouth at bedtime as needed for cough.         [name] ibuprofen (ADVIL,MOTRIN) 800 MG tablet   Oral   Take 1 tablet (800 mg total) by mouth every 8 (eight) hours as needed for moderate pain.   15 tablet   0   . mometasone-formoterol (DULERA) 200-5 MCG/ACT AERO   Inhalation   Inhale 2 puffs into the lungs 2 (two) times daily.   1 Inhaler   3   . oxyCODONE-acetaminophen (PERCOCET) 5-325 MG per tablet   Oral    Take 1 tablet by mouth every 4 (four) hours as needed for severe pain.   20 tablet   0   . oxyCODONE-acetaminophen (PERCOCET) 7.5-325 MG tablet   Oral   Take 1 tablet by mouth every 6 (six) hours as needed for severe pain.   12 tablet   0   . potassium chloride (K-DUR) 10 MEQ tablet      TAKE 1 TABLET (10 MEQ TOTAL) BY MOUTH DAILY.   30 tablet   6     CYCLE FILL MEDICATION. Authorization is required f ...   . potassium chloride (K-DUR) 10 MEQ tablet      TAKE 1 TABLET (10 MEQ TOTAL) BY MOUTH DAILY.   30 tablet   3     CYCLE FILL MEDICATION. Authorization is required f ...   . QUEtiapine (SEROQUEL XR) 300 MG 24 hr tablet   Oral   Take 300 mg by mouth at bedtime.         . SUMAtriptan (IMITREX) 25 MG tablet   Oral   Take 25 mg by mouth every 2 (two) hours as needed for migraine. May repeat in 2 hours if headache persists or recurs.         [name] tiotropium (SPIRIVA) 18 MCG inhalation capsule   Inhalation   Place 18 mcg into inhaler and inhale as needed.          [name] tiZANidine (ZANAFLEX) 4 MG tablet   Oral   Take 4 mg by mouth every 8 (eight) hours as needed for muscle spasms.         [name] topiramate (TOPAMAX) 25 MG tablet   Oral   Take 25 mg by mouth at bedtime.            Allergies Cherry; Shellfish allergy; Sulfa antibiotics; and Bee venom  Family History  Problem Relation Age of Onset  . COPD Mother   . Heart Problems Mother   . High Cholesterol Mother   . Hypertension Father   . AAA (abdominal aortic aneurysm) Sister   . COPD Sister   . High Cholesterol Sister     Social History Social History  Substance Use Topics  . Smoking status: Never Smoker   . Smokeless tobacco: Never Used  . Alcohol Use: No    Review of Systems Constitutional: No fever/chills. No  lightheadedness or syncope. Eyes: No visual changes. No eye discharge ENT: No sore throat. No ear pain. No congestion or rhinorrhea. Cardiovascular: Positive chest pain, negative  palpitations. Respiratory: Positive shortness of breath.  Positive cough. Gastrointestinal: No abdominal pain.  No nausea, no vomiting.  No diarrhea.  No constipation. Genitourinary: Negative for dysuria. Musculoskeletal: Negative for back pain. Skin: Negative for rash. Neurological: Negative for headaches, focal weakness or numbness.  10-point ROS otherwise negative.  ____________________________________________   PHYSICAL EXAM:  VITAL SIGNS: ED Triage Vitals  Enc Vitals Group     BP 11/21/15 1218 131/79 mmHg     Pulse Rate 11/21/15 1218 88     Resp 11/21/15 1218 20     Temp 11/21/15 1218 98.1 F (36.7 C)     Temp Source 11/21/15 1218 Oral     SpO2 11/21/15 1218 98 %     Weight 11/21/15 1218 160 lb (72.576 kg)     Height 11/21/15 1218 5\' 2"  (1.575 m)     Head Cir --      Peak Flow --      Pain Score 11/21/15 1218 9     Pain Loc --      Pain Edu? --      Excl. in [place]? --     Constitutional: Alert and oriented. Well appearing and in no acute distress. Answer question appropriately. Eyes: Conjunctivae are normal.  EOMI. no discharge. Head: Atraumatic. Nose: No congestion/rhinnorhea. Mouth/Throat: Mucous membranes are moist.  Neck: No stridor.  Supple.  No meningismus. Cardiovascular: Normal rate, regular rhythm. No murmurs, rubs or gallops.  Respiratory: Normal respiratory effort.  No retractions. Lungs CTAB.  No wheezes, rales or ronchi. Gastrointestinal: Soft and nontender. No distention. No peritoneal signs. Musculoskeletal: No LE edema.  Neurologic:  Normal speech and language. No gross focal neurologic deficits are appreciated.  Skin:  Skin is warm, dry and intact. No rash noted. Psychiatric: Affect with depressed mood. Speech and behavior are normal.  Normal judgement.  ____________________________________________   LABS (all labs ordered are listed, but only abnormal results are displayed)  Labs Reviewed  COMPREHENSIVE METABOLIC PANEL - Abnormal; Notable for  the following:    Potassium 3.4 (*)    Glucose, Bld 111 (*)    All other components within normal limits  CBC  TROPONIN I   ____________________________________________  EKG  ED ECG REPORT I, Eula Listen, the attending physician, personally viewed and interpreted this ECG.   Date: 11/21/2015  EKG Time: 1217  Rate: 76  Rhythm: normal EKG, normal sinus rhythm, unchanged from previous tracings, normal sinus rhythm  Axis: Normal  Intervals:none  ST&T Change: Nonspecific T-wave inversion in V1. No ST elevation. No ischemic changes.  ____________________________________________  RADIOLOGY  Dg Chest 2 View  11/21/2015  CLINICAL DATA:  Worsening shortness of breath. Right-sided chest pain radiating to the right breast. EXAM: CHEST  2 VIEW COMPARISON:  CT chest 05/19/2015 FINDINGS: The heart size and mediastinal contours are within normal limits. Both lungs are clear. The visualized skeletal structures are unremarkable. IMPRESSION: No active cardiopulmonary disease. Electronically Signed   By: Kathreen Devoid   On: 11/21/2015 12:40    ____________________________________________   PROCEDURES  Procedure(s) performed: None  Critical Care performed: No ____________________________________________   INITIAL IMPRESSION / ASSESSMENT AND PLAN / ED COURSE  Pertinent labs & imaging results that were available during my care of the patient were reviewed by me and considered in my medical decision making (see chart for details).  [place]  y.o. email with a history of asthma and COPD presenting with cough, chest pain, and shortness of breath. Clinically, the patient is stable vital signs and is well-appearing. She has a normal cardiopulmonary exam. Her chest x-ray does not show any active cardiopulmonary disease and she does not have an elevated white blood cell count. Her troponin today is negative. It is likely that the patient has either a cough for early upper respiratory infection, or  some reactive airway to the cold weather although she states she has not been outside much. I do not see any evidence of illness which requires admission to the hospital nor evidence for bacterial infection requiring antibiotics. I have encouraged the patient to continue taking her medications including her albuterol inhaler as needed for coughing spasms. She will follow-up with her primary care physician and her pulmonologist. We have discussed discharge instructions as well as return precautions.  ____________________________________________  FINAL CLINICAL IMPRESSION(S) / ED DIAGNOSES  Final diagnoses:  Cough  Shortness of breath  Right-sided chest pain      NEW MEDICATIONS STARTED DURING THIS VISIT:  New Prescriptions   No medications on file     Eula Listen, MD 11/21/15 1612

## 2015-12-25 ENCOUNTER — Emergency Department (HOSPITAL_COMMUNITY): Payer: BLUE CROSS/BLUE SHIELD

## 2015-12-25 ENCOUNTER — Emergency Department (HOSPITAL_COMMUNITY)
Admission: EM | Admit: 2015-12-25 | Discharge: 2015-12-25 | Disposition: A | Discharge status: Home / Self Care | Payer: BLUE CROSS/BLUE SHIELD | Attending: Emergency Medicine | Admitting: Emergency Medicine

## 2015-12-25 ENCOUNTER — Encounter (HOSPITAL_COMMUNITY): Payer: Self-pay | Admitting: *Deleted

## 2015-12-25 DIAGNOSIS — W1839XA Other fall on same level, initial encounter: Secondary | ICD-10-CM | POA: Diagnosis not present

## 2015-12-25 DIAGNOSIS — Z8639 Personal history of other endocrine, nutritional and metabolic disease: Secondary | ICD-10-CM | POA: Insufficient documentation

## 2015-12-25 DIAGNOSIS — Z862 Personal history of diseases of the blood and blood-forming organs and certain disorders involving the immune mechanism: Secondary | ICD-10-CM | POA: Insufficient documentation

## 2015-12-25 DIAGNOSIS — F419 Anxiety disorder, unspecified: Secondary | ICD-10-CM | POA: Insufficient documentation

## 2015-12-25 DIAGNOSIS — Z7951 Long term (current) use of inhaled steroids: Secondary | ICD-10-CM | POA: Insufficient documentation

## 2015-12-25 DIAGNOSIS — Z792 Long term (current) use of antibiotics: Secondary | ICD-10-CM | POA: Insufficient documentation

## 2015-12-25 DIAGNOSIS — J449 Chronic obstructive pulmonary disease, unspecified: Secondary | ICD-10-CM | POA: Diagnosis not present

## 2015-12-25 DIAGNOSIS — I10 Essential (primary) hypertension: Secondary | ICD-10-CM | POA: Insufficient documentation

## 2015-12-25 DIAGNOSIS — Y998 Other external cause status: Secondary | ICD-10-CM | POA: Diagnosis not present

## 2015-12-25 DIAGNOSIS — S6992XA Unspecified injury of left wrist, hand and finger(s), initial encounter: Secondary | ICD-10-CM | POA: Diagnosis present

## 2015-12-25 DIAGNOSIS — M797 Fibromyalgia: Secondary | ICD-10-CM | POA: Diagnosis not present

## 2015-12-25 DIAGNOSIS — Y9389 Activity, other specified: Secondary | ICD-10-CM | POA: Diagnosis not present

## 2015-12-25 DIAGNOSIS — F329 Major depressive disorder, single episode, unspecified: Secondary | ICD-10-CM | POA: Insufficient documentation

## 2015-12-25 DIAGNOSIS — Y9289 Other specified places as the place of occurrence of the external cause: Secondary | ICD-10-CM | POA: Insufficient documentation

## 2015-12-25 DIAGNOSIS — Z79899 Other long term (current) drug therapy: Secondary | ICD-10-CM | POA: Insufficient documentation

## 2015-12-25 DIAGNOSIS — S63502A Unspecified sprain of left wrist, initial encounter: Secondary | ICD-10-CM | POA: Insufficient documentation

## 2015-12-25 MED ORDER — IBUPROFEN 600 MG PO TABS: 600.0000 mg | ORAL_TABLET | Freq: Four times a day (QID) | ORAL | Status: DC | PRN

## 2015-12-25 MED ORDER — HYDROCODONE-ACETAMINOPHEN 5-325 MG PO TABS: 1.0000 | ORAL_TABLET | ORAL | Status: DC | PRN

## 2015-12-25 MED ORDER — IBUPROFEN 800 MG PO TABS
800.0000 mg | ORAL_TABLET | Freq: Once | ORAL | Status: AC
  Administered 2015-12-25: 800 mg via ORAL
  Filled 2015-12-25: qty 1

## 2015-12-25 MED ORDER — HYDROCODONE-ACETAMINOPHEN 5-325 MG PO TABS
1.0000 | ORAL_TABLET | Freq: Once | ORAL | Status: AC
  Administered 2015-12-25: 1 via ORAL
  Filled 2015-12-25: qty 1

## 2015-12-25 NOTE — ED Notes (Signed)
Patient given discharge instruction, verbalized understand. Patient ambulatory out of the department.  

## 2015-12-25 NOTE — Discharge Instructions (Signed)
Wrist Sprain A wrist sprain is a stretch or tear in the strong, fibrous tissues (ligaments) that connect your wrist bones. The ligaments of your wrist may be easily sprained. There are three types of wrist sprains.  Grade 1. The ligament is not stretched or torn, but the sprain causes pain.  Grade 2. The ligament is stretched or partially torn. You may be able to move your wrist, but not very much.  Grade 3. The ligament or muscle completely tears. You may find it difficult or extremely painful to move your wrist even a little. CAUSES Often, wrist sprains are a result of a fall or an injury. The force of the impact causes the fibers of your ligament to stretch too much or tear. Common causes of wrist sprains include:  Overextending your wrist while catching a ball with your hands.  Repetitive or strenuous extension or bending of your wrist.  Landing on your hand during a fall. RISK FACTORS  Having previous wrist injuries.  Playing contact sports, such as boxing or wrestling.  Participating in activities in which falling is common.  Having poor wrist strength and flexibility. SIGNS AND SYMPTOMS  Wrist pain.  Wrist tenderness.  Inflammation or bruising of the wrist area.  Hearing a "pop" or feeling a tear at the time of the injury.  Decreased wrist movement due to pain, stiffness, or weakness. DIAGNOSIS Your health care provider will examine your wrist. In some cases, an X-ray will be taken to make sure you did not break any bones. If your health care provider thinks that you tore a ligament, he or she may order an MRI of your wrist. TREATMENT Treatment involves resting and icing your wrist. You may also need to take pain medicines to help lessen pain and inflammation. Your health care provider may recommend keeping your wrist still (immobilized) with a splint to help your sprain heal. When the splint is no longer necessary, you may need to perform strengthening and stretching  exercises. These exercises help you to regain strength and full range of motion in your wrist. Surgery is not usually needed for wrist sprains unless the ligament completely tears. HOME CARE INSTRUCTIONS  Rest your wrist. Do not do things that cause pain.  Wear your wrist splint as directed by your health care provider.  Take medicines only as directed by your health care provider.  To ease pain and swelling, apply ice to the injured area.  Put ice in a plastic bag.  Place a towel between your skin and the bag.  Leave the ice on for 20 minutes, 2-3 times a day. SEEK MEDICAL CARE IF:  Your pain, discomfort, or swelling gets worse even with treatment.  You feel sudden numbness in your hand.   This information is not intended to replace advice given to you by your health care provider. Make sure you discuss any questions you have with your health care provider.   Document Released: 07/01/2014 Document Reviewed: 07/01/2014 Elsevier Interactive Patient Education 2016 [place] may take the hydrocodone prescribed for pain relief.  This will make you drowsy - do not drive within 4 hours of taking this medication.

## 2015-12-25 NOTE — ED Notes (Signed)
Pt fell on her left wrist  Last week and has had increased swelling and tenderness since. Pt is able to move her fingers with cap refill <3 seconds.

## 2015-12-25 NOTE — ED Provider Notes (Signed)
CSN: [id number]     Arrival date & time 12/25/15  1421 History  By signing my name below, I, Melinda Shaw, attest that this documentation has been prepared under the direction and in the presence of Evalee Jefferson, PA-C. [name] Electronically Signed: Hilda Shaw, ED Scribe. 12/25/2015. 4:50 PM.    Chief Complaint  Patient presents with  . Hand Injury     Patient is a 50 y.o. female presenting with hand injury. The history is provided by the patient. No language interpreter was used.  Hand Injury Location:  Wrist Time since incident:  1 week Injury: yes   Mechanism of injury: fall   Fall:    Fall occurred:  Walking   Impact surface:  [employer] of impact:  Hands   Entrapped after fall: no   Wrist location:  L wrist Pain details:    Radiates to:  Does not radiate   Severity:  Moderate   Onset quality:  Sudden   Duration:  1 week   Timing:  Constant   Progression:  Worsening Chronicity:  New Dislocation: no   Foreign body present:  No foreign bodies Prior injury to area:  No Relieved by:  Nothing Worsened by:  Movement and bearing weight Ineffective treatments:  Immobilization, heat and ice Associated symptoms: stiffness    HPI Comments: Melinda Shaw is a 50 y.o. female who presents to the Emergency Department complaining of constant, worsening left wrist pain that has been present for a week after pt had a fall. Pt states she landed on gravel after falling forward and trying to catch herself. Pt states her pain worsens with movement and palpation of the area, and states when she tries to grip something in her hand she has pain as well. Pt states she is right handed. Pt reports icing and heating the area with no relief. Pt denies taking any medications to treat her symptoms. Pt denies prior injury.   Past Medical History  Diagnosis Date  . Asthma   . Hyperlipidemia   . Hypertension   . Shortness of breath dyspnea   . Anxiety   . Headache   . Fibromyalgia   . Anemia      H/O  . COPD (chronic obstructive pulmonary disease) ([place])   . Depression    Past Surgical History  Procedure Laterality Date  . Anterior cruciate ligament repair    . Cholecystectomy    . Knee surgery      left knee   . Tubal ligation    . Nose surgery    . Polypectomy    . Video bronchoscopy N/A 05/29/2015    Procedure: VIDEO BRONCHOSCOPY WITHOUT FLUORO;  Surgeon: Vilinda Boehringer, MD;  Location: [facility] ORS;  Service: Cardiopulmonary;  Laterality: N/A;  . Esophagogastroduodenoscopy (egd) with propofol N/A 08/28/2015    Procedure: ESOPHAGOGASTRODUODENOSCOPY (EGD) WITH PROPOFOL;  Surgeon: Josefine Class, MD;  Location: [facility] ENDOSCOPY;  Service: Endoscopy;  Laterality: N/A;   Family History  Problem Relation Age of Onset  . COPD Mother   . Heart Problems Mother   . High Cholesterol Mother   . Hypertension Father   . AAA (abdominal aortic aneurysm) Sister   . COPD Sister   . High Cholesterol Sister    Social History  Substance Use Topics  . Smoking status: Never Smoker   . Smokeless tobacco: Never Used  . Alcohol Use: No   OB History    No data available     Review of  Systems  Musculoskeletal: Positive for arthralgias and stiffness. Negative for joint swelling.  Skin: Negative for wound.  Neurological: Negative for weakness and numbness.  All other systems reviewed and are negative.     Allergies  Cherry; Shellfish allergy; Sulfa antibiotics; and Bee venom  Home Medications   Prior to Admission medications   Medication Sig Start Date End Date Taking? Authorizing Provider  albuterol (PROVENTIL HFA;VENTOLIN HFA) 108 (90 BASE) MCG/ACT inhaler Inhale 2 puffs into the lungs every 6 (six) hours as needed for wheezing or shortness of breath.    Historical Provider, MD  budesonide-formoterol (SYMBICORT) 160-4.5 MCG/ACT inhaler Inhale 2 puffs into the lungs 2 (two) times daily. 05/23/15   Vishal Mungal, MD  ciprofloxacin (CIPRO) 500 MG tablet Take 500 mg by mouth 2 (two)  times daily.    Historical Provider, MD  citalopram (CELEXA) 20 MG tablet Take 20 mg by mouth every morning.     Historical Provider, MD  clonazePAM (KLONOPIN) 1 MG tablet Take 1 mg by mouth 3 (three) times daily.    Historical Provider, MD  diltiazem (TIAZAC) 120 MG 24 hr capsule Take 120 mg by mouth daily.    Historical Provider, MD  eletriptan (RELPAX) 20 MG tablet Take 20 mg by mouth every 2 (two) hours as needed.     Historical Provider, MD  Fluticasone-Salmeterol (ADVAIR) 250-50 MCG/DOSE AEPB Inhale 1 puff into the lungs 2 (two) times daily.    Historical Provider, MD  furosemide (LASIX) 20 MG tablet Take 1 tablet (20 mg total) by mouth 2 (two) times daily as needed. 03/29/15   Minna Merritts, MD  gabapentin (NEURONTIN) 100 MG capsule Take 200 mg by mouth at bedtime.     Historical Provider, MD  guaiFENesin-codeine 100-10 MG/5ML syrup Take 5 mLs by mouth at bedtime as needed for cough.    Historical Provider, MD  HYDROcodone-acetaminophen (NORCO/VICODIN) 5-325 MG tablet Take 1 tablet by mouth every 4 (four) hours as needed. 12/25/15   Evalee Jefferson, PA-C  ibuprofen (ADVIL,MOTRIN) 600 MG tablet Take 1 tablet (600 mg total) by mouth every 6 (six) hours as needed. 12/25/15   Evalee Jefferson, PA-C  mometasone-formoterol (DULERA) 200-5 MCG/ACT AERO Inhale 2 puffs into the lungs 2 (two) times daily. 05/29/15   Vishal Mungal, MD  oxyCODONE-acetaminophen (PERCOCET) 5-325 MG per tablet Take 1 tablet by mouth every 4 (four) hours as needed for severe pain. 07/04/15   Johnn Hai, PA-C  oxyCODONE-acetaminophen (PERCOCET) 7.5-325 MG tablet Take 1 tablet by mouth every 6 (six) hours as needed for severe pain. 08/29/15   Sable Feil, PA-C  potassium chloride (K-DUR) 10 MEQ tablet TAKE 1 TABLET (10 MEQ TOTAL) BY MOUTH DAILY. 09/28/15   Minna Merritts, MD  potassium chloride (K-DUR) 10 MEQ tablet TAKE 1 TABLET (10 MEQ TOTAL) BY MOUTH DAILY. 10/25/15   Minna Merritts, MD  QUEtiapine (SEROQUEL XR) 300 MG 24 hr  tablet Take 300 mg by mouth at bedtime.    Historical Provider, MD  SUMAtriptan (IMITREX) 25 MG tablet Take 25 mg by mouth every 2 (two) hours as needed for migraine. May repeat in 2 hours if headache persists or recurs.    Historical Provider, MD  tiotropium (SPIRIVA) 18 MCG inhalation capsule Place 18 mcg into inhaler and inhale as needed.     Historical Provider, MD  tiZANidine (ZANAFLEX) 4 MG tablet Take 4 mg by mouth every 8 (eight) hours as needed for muscle spasms.    Historical Provider, MD  topiramate (TOPAMAX) 25 MG tablet Take 25 mg by mouth at bedtime.  02/27/15   Historical Provider, MD   BP 143/87 mmHg  Pulse 82  Temp(Src) 98.5 F (36.9 C) (Oral)  Resp 14  Ht 5\' 2"  (1.575 m)  Wt 84.369 kg  BMI 34.01 kg/m2  SpO2 99%  LMP 12/11/2015 Physical Exam  Constitutional: She appears well-developed and well-nourished.  HENT:  Head: Atraumatic.  Neck: Normal range of motion.  Cardiovascular:  Pulses equal bilaterally  Musculoskeletal: She exhibits tenderness.  Tender to palpation along left radial wrist. Small  Tender nodule on volar wrist. No bruising, no edema, mild snuff box tenderness. FROM with both extension and flexion although it increases pain. No deformity. Distal sensation is intact with <2 second cap refill. Radial pulse intact. Elbow and proximal forearm nontender  Neurological: She is alert. She has normal strength. She displays normal reflexes. No sensory deficit.  Skin: Skin is warm and dry.  Psychiatric: She has a normal mood and affect.    ED Course  Procedures (including critical care time)  DIAGNOSTIC STUDIES: Oxygen Saturation is 99% on room air, normal by my interpretation.    COORDINATION OF CARE: 4:27 PM Discussed treatment plan with pt at bedside and pt agreed to plan.    Imaging Review  Final result by Rad Results In Interface (12/25/15 15:28:00)   Narrative:   CLINICAL DATA: Pain and small knot anterior aspect of the left wrist, tripped on  rocks and catched her self on outstretched hand aproximately 1 week ago  EXAM: LEFT WRIST - COMPLETE 3+ VIEW  COMPARISON: 07/30/2013 she  FINDINGS: Four views of the left wrist submitted. No acute fracture or subluxation. No radiopaque foreign body.  IMPRESSION: Negative.   Electronically Signed By: Lahoma Crocker M.D. On: 12/25/2015 15:28    I have personally reviewed and evaluated these images and lab results as part of my medical decision-making.   EKG Interpretation None      MDM   Final diagnoses:  Wrist sprain, left, initial encounter    Pt with Brown injury right wrist, radiolucent nodule volar wrist, possibly localized tendon/ sheath inflammation/edema.  No popping or crepitus with ROM of wrist or fingers.  She was placed in a velcro splint, advised heat tx, nsaids.  Referral to hand ortho for further eval/management.  I personally performed the services described in this documentation, which was scribed in my presence. The recorded information has been reviewed and is accurate.   Evalee Jefferson, PA-C 12/28/15 1108  Milton Ferguson, MD 12/28/15 [phone]

## 2016-01-03 ENCOUNTER — Other Ambulatory Visit (HOSPITAL_COMMUNITY): Payer: Self-pay | Admitting: Orthopedic Surgery

## 2016-01-03 DIAGNOSIS — S60212D Contusion of left wrist, subsequent encounter: Secondary | ICD-10-CM

## 2016-01-03 DIAGNOSIS — S60212A Contusion of left wrist, initial encounter: Secondary | ICD-10-CM | POA: Insufficient documentation

## 2016-01-17 ENCOUNTER — Ambulatory Visit (HOSPITAL_COMMUNITY)
Admission: RE | Admit: 2016-01-17 | Discharge: 2016-01-17 | Disposition: A | Discharge status: Home / Self Care | Payer: Medicare Other | Source: Ambulatory Visit | Attending: Orthopedic Surgery | Admitting: Orthopedic Surgery

## 2016-01-17 DIAGNOSIS — S60212D Contusion of left wrist, subsequent encounter: Secondary | ICD-10-CM

## 2016-01-17 DIAGNOSIS — S60212A Contusion of left wrist, initial encounter: Secondary | ICD-10-CM | POA: Insufficient documentation

## 2016-01-17 DIAGNOSIS — M67432 Ganglion, left wrist: Secondary | ICD-10-CM | POA: Diagnosis not present

## 2016-01-18 DIAGNOSIS — M67432 Ganglion, left wrist: Secondary | ICD-10-CM | POA: Insufficient documentation

## 2016-01-24 ENCOUNTER — Encounter (HOSPITAL_BASED_OUTPATIENT_CLINIC_OR_DEPARTMENT_OTHER): Payer: Self-pay | Admitting: *Deleted

## 2016-01-24 ENCOUNTER — Other Ambulatory Visit: Payer: Self-pay | Admitting: Orthopedic Surgery

## 2016-01-25 ENCOUNTER — Encounter (HOSPITAL_BASED_OUTPATIENT_CLINIC_OR_DEPARTMENT_OTHER)
Admission: RE | Admit: 2016-01-25 | Discharge: 2016-01-25 | Disposition: A | Discharge status: Home / Self Care | Payer: Medicare Other | Source: Ambulatory Visit | Attending: Orthopedic Surgery | Admitting: Orthopedic Surgery

## 2016-01-25 DIAGNOSIS — M67432 Ganglion, left wrist: Secondary | ICD-10-CM | POA: Diagnosis present

## 2016-01-25 DIAGNOSIS — M797 Fibromyalgia: Secondary | ICD-10-CM | POA: Diagnosis not present

## 2016-01-25 DIAGNOSIS — D649 Anemia, unspecified: Secondary | ICD-10-CM | POA: Diagnosis not present

## 2016-01-25 DIAGNOSIS — E669 Obesity, unspecified: Secondary | ICD-10-CM | POA: Diagnosis not present

## 2016-01-25 DIAGNOSIS — Z9049 Acquired absence of other specified parts of digestive tract: Secondary | ICD-10-CM | POA: Diagnosis not present

## 2016-01-25 DIAGNOSIS — Z6835 Body mass index (BMI) 35.0-35.9, adult: Secondary | ICD-10-CM | POA: Diagnosis not present

## 2016-01-25 DIAGNOSIS — K219 Gastro-esophageal reflux disease without esophagitis: Secondary | ICD-10-CM | POA: Diagnosis not present

## 2016-01-25 DIAGNOSIS — E785 Hyperlipidemia, unspecified: Secondary | ICD-10-CM | POA: Diagnosis not present

## 2016-01-25 DIAGNOSIS — J449 Chronic obstructive pulmonary disease, unspecified: Secondary | ICD-10-CM | POA: Diagnosis not present

## 2016-01-25 DIAGNOSIS — M71332 Other bursal cyst, left wrist: Secondary | ICD-10-CM | POA: Diagnosis not present

## 2016-01-25 DIAGNOSIS — I10 Essential (primary) hypertension: Secondary | ICD-10-CM | POA: Diagnosis not present

## 2016-01-25 LAB — BASIC METABOLIC PANEL
Anion gap: 15 (ref 5–15)
BUN: 13 mg/dL (ref 6–20)
CHLORIDE: 104 mmol/L (ref 101–111)
CO2: 21 mmol/L — AB (ref 22–32)
Calcium: 10.1 mg/dL (ref 8.9–10.3)
Creatinine, Ser: 0.88 mg/dL (ref 0.44–1.00)
GFR calc Af Amer: >60 mL/min (ref >60)
GLUCOSE: 94 mg/dL (ref 65–99)
POTASSIUM: 3.9 mmol/L (ref 3.5–5.1)
Sodium: 140 mmol/L (ref 135–145)

## 2016-01-26 ENCOUNTER — Ambulatory Visit (HOSPITAL_BASED_OUTPATIENT_CLINIC_OR_DEPARTMENT_OTHER)
Admission: RE | Admit: 2016-01-26 | Discharge: 2016-01-26 | Disposition: A | Discharge status: Home / Self Care | Payer: Medicare Other | Source: Ambulatory Visit | Attending: Orthopedic Surgery | Admitting: Orthopedic Surgery

## 2016-01-26 ENCOUNTER — Ambulatory Visit (HOSPITAL_BASED_OUTPATIENT_CLINIC_OR_DEPARTMENT_OTHER): Payer: Medicare Other | Admitting: Anesthesiology

## 2016-01-26 ENCOUNTER — Encounter (HOSPITAL_BASED_OUTPATIENT_CLINIC_OR_DEPARTMENT_OTHER)
Admission: RE | Disposition: A | Discharge status: Home / Self Care | Payer: Self-pay | Source: Ambulatory Visit | Attending: Orthopedic Surgery

## 2016-01-26 ENCOUNTER — Encounter (HOSPITAL_BASED_OUTPATIENT_CLINIC_OR_DEPARTMENT_OTHER): Payer: Self-pay | Admitting: Anesthesiology

## 2016-01-26 DIAGNOSIS — K219 Gastro-esophageal reflux disease without esophagitis: Secondary | ICD-10-CM | POA: Insufficient documentation

## 2016-01-26 DIAGNOSIS — M71332 Other bursal cyst, left wrist: Secondary | ICD-10-CM | POA: Insufficient documentation

## 2016-01-26 DIAGNOSIS — J449 Chronic obstructive pulmonary disease, unspecified: Secondary | ICD-10-CM | POA: Insufficient documentation

## 2016-01-26 DIAGNOSIS — I10 Essential (primary) hypertension: Secondary | ICD-10-CM | POA: Insufficient documentation

## 2016-01-26 DIAGNOSIS — M797 Fibromyalgia: Secondary | ICD-10-CM | POA: Diagnosis not present

## 2016-01-26 DIAGNOSIS — Z9049 Acquired absence of other specified parts of digestive tract: Secondary | ICD-10-CM | POA: Insufficient documentation

## 2016-01-26 DIAGNOSIS — D649 Anemia, unspecified: Secondary | ICD-10-CM | POA: Insufficient documentation

## 2016-01-26 DIAGNOSIS — Z6835 Body mass index (BMI) 35.0-35.9, adult: Secondary | ICD-10-CM | POA: Insufficient documentation

## 2016-01-26 DIAGNOSIS — E785 Hyperlipidemia, unspecified: Secondary | ICD-10-CM | POA: Insufficient documentation

## 2016-01-26 DIAGNOSIS — E669 Obesity, unspecified: Secondary | ICD-10-CM | POA: Insufficient documentation

## 2016-01-26 HISTORY — DX: Nausea with vomiting, unspecified: R11.2

## 2016-01-26 HISTORY — DX: Other specified postprocedural states: Z98.890

## 2016-01-26 HISTORY — DX: Gastro-esophageal reflux disease without esophagitis: K21.9

## 2016-01-26 HISTORY — PX: MASS EXCISION: SHX2000

## 2016-01-26 SURGERY — EXCISION MASS
Anesthesia: General | Site: Wrist | Laterality: Left

## 2016-01-26 MED ORDER — BUPIVACAINE HCL (PF) 0.5 % IJ SOLN
INTRAMUSCULAR | Status: AC
  Filled 2016-01-26: qty 30

## 2016-01-26 MED ORDER — HYDROCODONE-ACETAMINOPHEN 5-325 MG PO TABS
ORAL_TABLET | ORAL | Status: AC
  Filled 2016-01-26: qty 1

## 2016-01-26 MED ORDER — CHLORHEXIDINE GLUCONATE 4 % EX LIQD: 60.0000 mL | Freq: Once | CUTANEOUS | Status: DC

## 2016-01-26 MED ORDER — SCOPOLAMINE 1 MG/3DAYS TD PT72: 1.0000 | MEDICATED_PATCH | Freq: Once | TRANSDERMAL | Status: DC | PRN

## 2016-01-26 MED ORDER — METOCLOPRAMIDE HCL 5 MG/ML IJ SOLN: 10.0000 mg | Freq: Once | INTRAMUSCULAR | Status: DC | PRN

## 2016-01-26 MED ORDER — BUPIVACAINE-EPINEPHRINE 0.25% -1:200000 IJ SOLN
INTRAMUSCULAR | Status: DC | PRN
  Administered 2016-01-26: 9 mL

## 2016-01-26 MED ORDER — DEXAMETHASONE SODIUM PHOSPHATE 4 MG/ML IJ SOLN
INTRAMUSCULAR | Status: DC | PRN
  Administered 2016-01-26: 10 mg via INTRAVENOUS

## 2016-01-26 MED ORDER — FENTANYL CITRATE (PF) 100 MCG/2ML IJ SOLN
INTRAMUSCULAR | Status: AC
  Filled 2016-01-26: qty 2

## 2016-01-26 MED ORDER — GLYCOPYRROLATE 0.2 MG/ML IJ SOLN: 0.2000 mg | Freq: Once | INTRAMUSCULAR | Status: DC | PRN

## 2016-01-26 MED ORDER — HYDROCODONE-ACETAMINOPHEN 7.5-325 MG PO TABS: 1.0000 | ORAL_TABLET | Freq: Once | ORAL | Status: DC | PRN

## 2016-01-26 MED ORDER — PROPOFOL 10 MG/ML IV BOLUS
INTRAVENOUS | Status: DC | PRN
  Administered 2016-01-26: 150 mg via INTRAVENOUS
  Administered 2016-01-26: 50 mg via INTRAVENOUS

## 2016-01-26 MED ORDER — CEFAZOLIN SODIUM 1 G IJ SOLR
INTRAMUSCULAR | Status: AC
  Filled 2016-01-26: qty 10

## 2016-01-26 MED ORDER — OXYCODONE-ACETAMINOPHEN 5-325 MG PO TABS: 1.0000 | ORAL_TABLET | ORAL | Status: DC | PRN

## 2016-01-26 MED ORDER — ONDANSETRON HCL 4 MG/2ML IJ SOLN
INTRAMUSCULAR | Status: DC | PRN
  Administered 2016-01-26: 4 mg via INTRAVENOUS

## 2016-01-26 MED ORDER — MIDAZOLAM HCL 2 MG/2ML IJ SOLN
1.0000 mg | INTRAMUSCULAR | Status: DC | PRN
  Administered 2016-01-26: 1 mg via INTRAVENOUS
  Administered 2016-01-26: 2 mg via INTRAVENOUS
  Administered 2016-01-26: 1 mg via INTRAVENOUS

## 2016-01-26 MED ORDER — FENTANYL CITRATE (PF) 100 MCG/2ML IJ SOLN
50.0000 ug | INTRAMUSCULAR | Status: DC | PRN
  Administered 2016-01-26 (×2): 50 ug via INTRAVENOUS

## 2016-01-26 MED ORDER — HYDROCODONE-ACETAMINOPHEN 5-325 MG PO TABS
1.0000 | ORAL_TABLET | Freq: Once | ORAL | Status: AC
  Administered 2016-01-26: 1 via ORAL

## 2016-01-26 MED ORDER — CEFAZOLIN SODIUM-DEXTROSE 2-3 GM-% IV SOLR
2.0000 g | INTRAVENOUS | Status: AC
  Administered 2016-01-26: 2 g via INTRAVENOUS

## 2016-01-26 MED ORDER — LIDOCAINE HCL (PF) 1 % IJ SOLN
INTRAMUSCULAR | Status: AC
Start: 2016-01-26 — End: 2016-01-26
  Filled 2016-01-26: qty 30

## 2016-01-26 MED ORDER — MIDAZOLAM HCL 2 MG/2ML IJ SOLN
INTRAMUSCULAR | Status: AC
  Filled 2016-01-26: qty 2

## 2016-01-26 MED ORDER — LACTATED RINGERS IV SOLN
INTRAVENOUS | Status: DC
  Administered 2016-01-26: 10:00:00 via INTRAVENOUS
  Administered 2016-01-26: 10 mL/h via INTRAVENOUS

## 2016-01-26 MED ORDER — LIDOCAINE HCL (CARDIAC) 20 MG/ML IV SOLN
INTRAVENOUS | Status: DC | PRN
  Administered 2016-01-26: 60 mg via INTRAVENOUS

## 2016-01-26 MED ORDER — LIDOCAINE HCL (CARDIAC) 20 MG/ML IV SOLN
INTRAVENOUS | Status: AC
  Filled 2016-01-26: qty 5

## 2016-01-26 MED ORDER — FENTANYL CITRATE (PF) 100 MCG/2ML IJ SOLN
25.0000 ug | INTRAMUSCULAR | Status: DC | PRN
  Administered 2016-01-26: 25 ug via INTRAVENOUS
  Administered 2016-01-26: 50 ug via INTRAVENOUS

## 2016-01-26 MED ORDER — MEPERIDINE HCL 25 MG/ML IJ SOLN: 6.2500 mg | INTRAMUSCULAR | Status: DC | PRN

## 2016-01-26 SURGICAL SUPPLY — 47 items
APL SKNCLS STERI-STRIP NONHPOA (GAUZE/BANDAGES/DRESSINGS)
BAG DECANTER FOR FLEXI CONT (MISCELLANEOUS) IMPLANT
BANDAGE ACE 3X5.8 VEL STRL LF (GAUZE/BANDAGES/DRESSINGS) IMPLANT
BANDAGE ACE 4X5 VEL STRL LF (GAUZE/BANDAGES/DRESSINGS) ×3 IMPLANT
BENZOIN TINCTURE PRP APPL 2/3 (GAUZE/BANDAGES/DRESSINGS) IMPLANT
BLADE SURG 15 STRL LF DISP TIS (BLADE) ×1 IMPLANT
BLADE SURG 15 STRL SS (BLADE) ×3
BNDG CMPR 9X4 STRL LF SNTH (GAUZE/BANDAGES/DRESSINGS) ×1
BNDG ESMARK 4X9 LF (GAUZE/BANDAGES/DRESSINGS) ×3 IMPLANT
BNDG GAUZE ELAST 4 BULKY (GAUZE/BANDAGES/DRESSINGS) ×3 IMPLANT
CLOSURE WOUND 1/2 X4 (GAUZE/BANDAGES/DRESSINGS)
CORDS BIPOLAR (ELECTRODE) ×3 IMPLANT
COVER BACK TABLE 60X90IN (DRAPES) ×3 IMPLANT
CUFF TOURNIQUET SINGLE 18IN (TOURNIQUET CUFF) IMPLANT
DECANTER SPIKE VIAL GLASS SM (MISCELLANEOUS) IMPLANT
DRAPE EXTREMITY T 121X128X90 (DRAPE) ×3 IMPLANT
DRAPE SURG 17X23 STRL (DRAPES) ×3 IMPLANT
DURAPREP 26ML APPLICATOR (WOUND CARE) ×3 IMPLANT
GAUZE SPONGE 4X4 12PLY STRL (GAUZE/BANDAGES/DRESSINGS) ×3 IMPLANT
GAUZE XEROFORM 1X8 LF (GAUZE/BANDAGES/DRESSINGS) IMPLANT
GLOVE SURG SYN 8.0 (GLOVE) ×6 IMPLANT
GOWN STRL REUS W/ TWL LRG LVL3 (GOWN DISPOSABLE) ×1 IMPLANT
GOWN STRL REUS W/TWL LRG LVL3 (GOWN DISPOSABLE) ×3
GOWN STRL REUS W/TWL XL LVL3 (GOWN DISPOSABLE) ×6 IMPLANT
NEEDLE HYPO 25X1 1.5 SAFETY (NEEDLE) ×3 IMPLANT
NS IRRIG 1000ML POUR BTL (IV SOLUTION) ×3 IMPLANT
PACK BASIN DAY SURGERY FS (CUSTOM PROCEDURE TRAY) ×3 IMPLANT
PAD CAST 3X4 CTTN HI CHSV (CAST SUPPLIES) ×1 IMPLANT
PADDING CAST ABS 3INX4YD NS (CAST SUPPLIES) ×2
PADDING CAST ABS COTTON 3X4 (CAST SUPPLIES) ×1 IMPLANT
PADDING CAST COTTON 3X4 STRL (CAST SUPPLIES) ×3
SHEET MEDIUM DRAPE 40X70 STRL (DRAPES) ×3 IMPLANT
SPLINT PLASTER CAST XFAST 4X15 (CAST SUPPLIES) ×5 IMPLANT
SPLINT PLASTER XTRA FAST SET 4 (CAST SUPPLIES) ×10
SPONGE GAUZE 4X4 12PLY STER LF (GAUZE/BANDAGES/DRESSINGS) ×3 IMPLANT
STOCKINETTE 4X48 STRL (DRAPES) ×3 IMPLANT
STRIP CLOSURE SKIN 1/2X4 (GAUZE/BANDAGES/DRESSINGS) IMPLANT
SUT ETHILON 5 0 PS 2 18 (SUTURE) IMPLANT
SUT PROLENE 3 0 PS 2 (SUTURE) ×3 IMPLANT
SUT VIC AB 4-0 P-3 18XBRD (SUTURE) IMPLANT
SUT VIC AB 4-0 P3 18 (SUTURE)
SUT VICRYL RAPIDE 4-0 (SUTURE) IMPLANT
SUT VICRYL RAPIDE 4/0 PS 2 (SUTURE) IMPLANT
SYR BULB 3OZ (MISCELLANEOUS) ×3 IMPLANT
SYRINGE 10CC LL (SYRINGE) IMPLANT
TOWEL OR 17X24 6PK STRL BLUE (TOWEL DISPOSABLE) ×3 IMPLANT
UNDERPAD 30X30 (UNDERPADS AND DIAPERS) ×3 IMPLANT

## 2016-01-26 NOTE — Op Note (Signed)
Melinda Shaw, Melinda Shaw              ACCOUNT NO.:  [id number]  MEDICAL RECORD NO.:  [id number]  LOCATION:                                 FACILITY:  PHYSICIAN:  Sheral Apley. Lolly Glaus, M.D.DATE OF BIRTH:  1966-04-13  DATE OF PROCEDURE:  01/26/2016 DATE OF DISCHARGE:                              OPERATIVE REPORT   PREOPERATIVE DIAGNOSIS:  Painful left wrist volar ganglion cyst.  POSTOPERATIVE DIAGNOSIS:  Painful left wrist volar ganglion cyst.  PROCEDURE:  Excision of left wrist deep mass (ganglion cyst 2 x 2 cm).  SURGEON:  Sheral Apley. Burney Gauze, M.D.  ASSISTANT:  None.  ANESTHESIA:  General.  COMPLICATIONS:  None.  DRAINS:  None.  SPECIMEN:  One specimen sent.  DESCRIPTION OF PROCEDURE:  The patient was taken to the operating suite. After induction of adequate general anesthetic, left upper extremity was prepped and draped in sterile fashion.  An Esmarch was used to exsanguinate the limb.  Tourniquet was then inflated to 250 mmHg.  At this point in time, an incision was made over a premarked palpable mass 2 x 2 cm in between the radial artery and the flexor carpi radialis tendon.  Skin was incised 3-4 cm.  Dissection was carried down to the interval between the radial artery and the flexor carpi radialis.  The sheath overlying the FCR was incised.  We retracted the FCR in the midline.  We identified the radial artery proximal and distal to this mass and carefully dissected free and retracted laterally.  Several transversing branches were cauterized using bipolar cautery.  We then carefully dissected the cyst down to a stalk and went ahead and excised in its entirety.  We cauterized the stalk with the bipolar cautery.  We irrigated and loosely closed with a 3-0 Prolene subcuticular stitch. Steri-Strips, 4x4s, fluffs and a volar splint was applied.  The patient tolerated the procedure well and went to recovery room in stable fashion.    Sheral Apley Burney Gauze,  M.D.    MAW/MEDQ  D:  01/26/2016  T:  01/26/2016  Job:  [id number]

## 2016-01-26 NOTE — Discharge Instructions (Signed)

## 2016-01-26 NOTE — Anesthesia Preprocedure Evaluation (Addendum)
Anesthesia Evaluation  Patient identified by MRN, date of birth, ID band Patient awake    Reviewed: Allergy & Precautions, NPO status , Patient's Chart, lab work & pertinent test results  History of Anesthesia Complications (+) PONV and history of anesthetic complications  Airway Mallampati: III  TM Distance: >3 FB Neck ROM: Full    Dental no notable dental hx. (+) Teeth Intact   Pulmonary shortness of breath, COPD,    Pulmonary exam normal breath sounds clear to auscultation       Cardiovascular hypertension, Pt. on medications Normal cardiovascular exam Rhythm:Regular Rate:Normal     Neuro/Psych  Headaches, PSYCHIATRIC DISORDERS Anxiety Depression  Neuromuscular disease    GI/Hepatic Neg liver ROS, GERD  Medicated and Controlled,  Endo/Other  Obesity Hyperlipidemia  Renal/GU negative Renal ROS  negative genitourinary   Musculoskeletal  (+) Fibromyalgia -  Abdominal (+) + obese,   Peds  Hematology  (+) anemia ,   Anesthesia Other Findings   Reproductive/Obstetrics                           Anesthesia Physical Anesthesia Plan  ASA: III  Anesthesia Plan: General   Post-op Pain Management:    Induction: Intravenous  Airway Management Planned: LMA  Additional Equipment:   Intra-op Plan:   Post-operative Plan: Extubation in OR  Informed Consent: I have reviewed the patients History and Physical, chart, labs and discussed the procedure including the risks, benefits and alternatives for the proposed anesthesia with the patient or authorized representative who has indicated his/her understanding and acceptance.   Dental advisory given  Plan Discussed with: CRNA, Anesthesiologist and Surgeon  Anesthesia Plan Comments:         Anesthesia Quick Evaluation

## 2016-01-26 NOTE — Op Note (Signed)
See note [id number]

## 2016-01-26 NOTE — H&P (Signed)
Melinda Shaw is an 50 y.o. female.   Chief Complaint: left wrist pain with mri documented mass HPI: as above s/p trauma to left wrist with painful mass  Past Medical History  Diagnosis Date  . Hyperlipidemia   . Shortness of breath dyspnea   . Anxiety   . Headache   . Fibromyalgia   . Anemia     H/O  . COPD (chronic obstructive pulmonary disease) ([place])   . Depression   . Hypertension     no meds  . GERD (gastroesophageal reflux disease)   . PONV (postoperative nausea and vomiting)     Past Surgical History  Procedure Laterality Date  . Anterior cruciate ligament repair    . Cholecystectomy    . Knee surgery      left knee   . Tubal ligation    . Nose surgery    . Polypectomy    . Video bronchoscopy N/A 05/29/2015    Procedure: VIDEO BRONCHOSCOPY WITHOUT FLUORO;  Surgeon: Vilinda Boehringer, MD;  Location: [facility] ORS;  Service: Cardiopulmonary;  Laterality: N/A;  . Esophagogastroduodenoscopy (egd) with propofol N/A 08/28/2015    Procedure: ESOPHAGOGASTRODUODENOSCOPY (EGD) WITH PROPOFOL;  Surgeon: Josefine Class, MD;  Location: [facility] ENDOSCOPY;  Service: Endoscopy;  Laterality: N/A;    Family History  Problem Relation Age of Onset  . COPD Mother   . Heart Problems Mother   . High Cholesterol Mother   . Hypertension Father   . AAA (abdominal aortic aneurysm) Sister   . COPD Sister   . High Cholesterol Sister    Social History:  reports that she has never smoked. She has never used smokeless tobacco. She reports that she does not drink alcohol or use illicit drugs.  Allergies:  Allergies  Allergen Reactions  . Cherry Swelling    Swelling of lips and throat  . Shellfish Allergy Shortness Of Breath  . Sulfa Antibiotics Shortness Of Breath    Stopped breathing, Swelling of throat  . Bee Venom     Swelling of throat    No prescriptions prior to admission    Results for orders placed or performed during the hospital encounter of 01/26/16 (from the past 48  hour(s))  Basic metabolic panel     Status: Abnormal   Collection Time: 01/25/16 12:17 PM  Result Value Ref Range   Sodium 140 135 - 145 mmol/L   Potassium 3.9 3.5 - 5.1 mmol/L   Chloride 104 101 - 111 mmol/L   CO2 21 (L) 22 - 32 mmol/L   Glucose, Bld 94 65 - 99 mg/dL   BUN 13 6 - 20 mg/dL   Creatinine, Ser 0.88 0.44 - 1.00 mg/dL   Calcium 10.1 8.9 - 10.3 mg/dL   GFR calc non Af Amer >60 >60 mL/min   GFR calc Af Amer >60 >60 mL/min    Comment: (NOTE) The eGFR has been calculated using the CKD EPI equation. This calculation has not been validated in all clinical situations. eGFR's persistently <60 mL/min signify possible Chronic Kidney Disease.    Anion gap 15 5 - 15   No results found.  Review of Systems  All other systems reviewed and are negative.   Height '5\' 2"'$  (1.575 m), weight 87.998 kg (194 lb), last menstrual period 01/09/2016. Physical Exam  Constitutional: She is oriented to person, place, and time. She appears well-developed and well-nourished.  HENT:  Head: Normocephalic and atraumatic.  Cardiovascular: Normal rate.   Respiratory: Effort normal.  Musculoskeletal:  Left wrist: She exhibits tenderness and swelling.  Left wrist painful volar mass 2x2 cm  Neurological: She is alert and oriented to person, place, and time.  Skin: Skin is warm.  Psychiatric: She has a normal mood and affect. Her behavior is normal. Judgment and thought content normal.     Assessment/Plan As above  Plan excision of above  Charlotte Crumb A, MD 01/26/2016, 5:41 AM

## 2016-01-26 NOTE — Anesthesia Procedure Notes (Signed)
Procedure Name: LMA Insertion Date/Time: 01/26/2016 11:19 AM Performed by: Lyndee Leo Pre-anesthesia Checklist: Patient identified, Emergency Drugs available, Suction available and Patient being monitored Patient Re-evaluated:Patient Re-evaluated prior to inductionOxygen Delivery Method: Circle System Utilized Preoxygenation: Pre-oxygenation with 100% oxygen Intubation Type: IV induction Ventilation: Mask ventilation without difficulty LMA: LMA inserted LMA Size: 4.0 Number of attempts: 1 Airway Equipment and Method: Bite block Placement Confirmation: positive ETCO2 Tube secured with: Tape Dental Injury: Teeth and Oropharynx as per pre-operative assessment

## 2016-01-26 NOTE — Transfer of Care (Signed)
Immediate Anesthesia Transfer of Care Note  Patient: Melinda Shaw  Procedure(s) Performed: Procedure(s): EXCISION OF LEFT WRIST VOLAR MASS (Left)  Patient Location: PACU  Anesthesia Type:General  Level of Consciousness: sedated, patient cooperative and lethargic  Airway & Oxygen Therapy: Patient Spontanous Breathing and Patient connected to face mask oxygen  Post-op Assessment: Report given to RN and Post -op Vital signs reviewed and stable  Post vital signs: Reviewed and stable  Last Vitals:  Filed Vitals:   01/26/16 1002  BP: 143/74  Pulse: 101  Temp: 36.8 C  Resp: 18    Complications: No apparent anesthesia complications

## 2016-01-26 NOTE — Anesthesia Postprocedure Evaluation (Signed)
Anesthesia Post Note  Patient: Melinda Shaw  Procedure(s) Performed: Procedure(s) (LRB): EXCISION OF LEFT WRIST VOLAR MASS (Left)  Patient location during evaluation: PACU Anesthesia Type: General Level of consciousness: awake and alert Pain management: pain level controlled Vital Signs Assessment: post-procedure vital signs reviewed and stable Respiratory status: spontaneous breathing, nonlabored ventilation and respiratory function stable Cardiovascular status: blood pressure returned to baseline and stable Postop Assessment: no signs of nausea or vomiting Anesthetic complications: no    Last Vitals:  Filed Vitals:   01/26/16 1300 01/26/16 1318  BP: 127/80 129/84  Pulse: 67 86  Temp:  36.7 C  Resp: 12 16    Last Pain:  Filed Vitals:   01/26/16 1334  PainSc: 6                  Jayquon Theiler A

## 2016-01-29 ENCOUNTER — Encounter (HOSPITAL_BASED_OUTPATIENT_CLINIC_OR_DEPARTMENT_OTHER): Payer: Self-pay | Admitting: Orthopedic Surgery

## 2016-01-30 ENCOUNTER — Encounter (HOSPITAL_BASED_OUTPATIENT_CLINIC_OR_DEPARTMENT_OTHER): Payer: Self-pay | Admitting: Orthopedic Surgery

## 2016-03-03 ENCOUNTER — Emergency Department: Payer: Self-pay

## 2016-03-03 ENCOUNTER — Emergency Department
Admission: EM | Admit: 2016-03-03 | Discharge: 2016-03-03 | Disposition: A | Discharge status: Home / Self Care | Payer: Self-pay | Attending: Emergency Medicine | Admitting: Emergency Medicine

## 2016-03-03 ENCOUNTER — Encounter: Payer: Self-pay | Admitting: Emergency Medicine

## 2016-03-03 DIAGNOSIS — I10 Essential (primary) hypertension: Secondary | ICD-10-CM | POA: Insufficient documentation

## 2016-03-03 DIAGNOSIS — Y9389 Activity, other specified: Secondary | ICD-10-CM | POA: Insufficient documentation

## 2016-03-03 DIAGNOSIS — F329 Major depressive disorder, single episode, unspecified: Secondary | ICD-10-CM | POA: Insufficient documentation

## 2016-03-03 DIAGNOSIS — S93401A Sprain of unspecified ligament of right ankle, initial encounter: Secondary | ICD-10-CM

## 2016-03-03 DIAGNOSIS — Y929 Unspecified place or not applicable: Secondary | ICD-10-CM | POA: Insufficient documentation

## 2016-03-03 DIAGNOSIS — X58XXXA Exposure to other specified factors, initial encounter: Secondary | ICD-10-CM | POA: Insufficient documentation

## 2016-03-03 DIAGNOSIS — Z79899 Other long term (current) drug therapy: Secondary | ICD-10-CM | POA: Insufficient documentation

## 2016-03-03 DIAGNOSIS — Y999 Unspecified external cause status: Secondary | ICD-10-CM | POA: Insufficient documentation

## 2016-03-03 DIAGNOSIS — J449 Chronic obstructive pulmonary disease, unspecified: Secondary | ICD-10-CM | POA: Insufficient documentation

## 2016-03-03 DIAGNOSIS — E785 Hyperlipidemia, unspecified: Secondary | ICD-10-CM | POA: Insufficient documentation

## 2016-03-03 MED ORDER — IBUPROFEN 600 MG PO TABS: 600.0000 mg | ORAL_TABLET | Freq: Four times a day (QID) | ORAL | Status: DC | PRN

## 2016-03-03 NOTE — ED Provider Notes (Signed)
[facility] Emergency Department Provider Note  ____________________________________________  Time seen: Approximately 12:42 PM  I have reviewed the triage vital signs and the nursing notes.   HISTORY  Chief Complaint Ankle Pain    HPI Melinda Shaw is a 50 y.o. female , NAD, presents to the emergency department with right ankle pain and swelling after stepping in a hole on Thursday. States she woke this morning with increasing pain and was unable to bear weight.Swelling has increased over the last 24 hours. Has been icing and elevating at times. Tried to wrap the ankle and foot but felt that made her pain and swelling worse. Denies any numbness, weakness, tingling. No open wounds, lacerations, sores. Has not noted any oozing about her ankle. Denies any chest pain, shortness of breath, back pain or abdominal pain.   Past Medical History  Diagnosis Date  . Hyperlipidemia   . Shortness of breath dyspnea   . Anxiety   . Headache   . Fibromyalgia   . Anemia     H/O  . COPD (chronic obstructive pulmonary disease) ([place])   . Depression   . Hypertension     no meds  . GERD (gastroesophageal reflux disease)   . PONV (postoperative nausea and vomiting)     Patient Active Problem List   Diagnosis Date Noted  . Lung infiltrate   . Pulmonary infiltrate in right lung on chest x-ray   . Pulmonary infiltrate in left lung on chest x-ray   . Reactive airway disease 03/21/2015  . Dyspnea 02/22/2015  . Cough 02/22/2015  . Acute upper respiratory infection 02/22/2015    Past Surgical History  Procedure Laterality Date  . Anterior cruciate ligament repair    . Cholecystectomy    . Knee surgery      left knee   . Tubal ligation    . Nose surgery    . Polypectomy    . Video bronchoscopy N/A 05/29/2015    Procedure: VIDEO BRONCHOSCOPY WITHOUT FLUORO;  Surgeon: Vilinda Boehringer, MD;  Location: [facility] ORS;  Service: Cardiopulmonary;  Laterality: N/A;  .  Esophagogastroduodenoscopy (egd) with propofol N/A 08/28/2015    Procedure: ESOPHAGOGASTRODUODENOSCOPY (EGD) WITH PROPOFOL;  Surgeon: Josefine Class, MD;  Location: [facility] ENDOSCOPY;  Service: Endoscopy;  Laterality: N/A;  . Mass excision Left 01/26/2016    Procedure: EXCISION OF LEFT WRIST VOLAR MASS;  Surgeon: Charlotte Crumb, MD;  Location: [facility];  Service: Orthopedics;  Laterality: Left;    Current Outpatient Rx  Name  Route  Sig  Dispense  Refill  . albuterol (PROVENTIL HFA;VENTOLIN HFA) 108 (90 BASE) MCG/ACT inhaler   Inhalation   Inhale 2 puffs into the lungs every 6 (six) hours as needed for wheezing or shortness of breath.         . citalopram (CELEXA) 20 MG tablet   Oral   Take 20 mg by mouth every morning.          . clonazePAM (KLONOPIN) 1 MG tablet   Oral   Take 1 mg by mouth 3 (three) times daily.         [name] eletriptan (RELPAX) 20 MG tablet   Oral   Take 20 mg by mouth every 2 (two) hours as needed.          . furosemide (LASIX) 20 MG tablet   Oral   Take 1 tablet (20 mg total) by mouth 2 (two) times daily as needed.   180 tablet  4   . gabapentin (NEURONTIN) 100 MG capsule   Oral   Take 200 mg by mouth at bedtime.          [name] ibuprofen (ADVIL,MOTRIN) 600 MG tablet   Oral   Take 1 tablet (600 mg total) by mouth every 6 (six) hours as needed.   30 tablet   0   . mometasone-formoterol (DULERA) 200-5 MCG/ACT AERO   Inhalation   Inhale 2 puffs into the lungs 2 (two) times daily.   1 Inhaler   3   . omeprazole (PRILOSEC) 20 MG capsule   Oral   Take 20 mg by mouth daily.         [name] oxyCODONE-acetaminophen (ROXICET) 5-325 MG tablet   Oral   Take 1 tablet by mouth every 4 (four) hours as needed for severe pain.   30 tablet   0   . potassium chloride (K-DUR) 10 MEQ tablet      TAKE 1 TABLET (10 MEQ TOTAL) BY MOUTH DAILY.   30 tablet   6     CYCLE FILL MEDICATION. Authorization is required f ...   . QUEtiapine  (SEROQUEL XR) 300 MG 24 hr tablet   Oral   Take 300 mg by mouth at bedtime.         . SUMAtriptan (IMITREX) 25 MG tablet   Oral   Take 25 mg by mouth every 2 (two) hours as needed for migraine. May repeat in 2 hours if headache persists or recurs.         [name] tiZANidine (ZANAFLEX) 4 MG tablet   Oral   Take 4 mg by mouth every 8 (eight) hours as needed for muscle spasms.         [name] topiramate (TOPAMAX) 25 MG tablet   Oral   Take 25 mg by mouth at bedtime.          . traMADol (ULTRAM) 50 MG tablet   Oral   Take by mouth every 6 (six) hours as needed.           Allergies Cherry; Shellfish allergy; Sulfa antibiotics; and Bee venom  Family History  Problem Relation Age of Onset  . COPD Mother   . Heart Problems Mother   . High Cholesterol Mother   . Hypertension Father   . AAA (abdominal aortic aneurysm) Sister   . COPD Sister   . High Cholesterol Sister     Social History Social History  Substance Use Topics  . Smoking status: Never Smoker   . Smokeless tobacco: Never Used  . Alcohol Use: No     Review of Systems  Constitutional: No fever/chills, fatigue Cardiovascular: No chest pain. Respiratory:  No shortness of breath. Gastrointestinal: No abdominal pain.   Musculoskeletal: Positive right ankle and foot pain. Negative right knee, right hip, Negative for back pain.  Skin: Positive bruising, swelling right ankle. Negative for rash, open wounds, skin sores. Neurological: Negative for headaches, focal weakness or numbness. No tingling 10-point ROS otherwise negative.  ____________________________________________   PHYSICAL EXAM:  VITAL SIGNS: ED Triage Vitals  Enc Vitals Group     BP 03/03/16 1136 142/80 mmHg     Pulse Rate 03/03/16 1136 98     Resp 03/03/16 1136 20     Temp 03/03/16 1136 98.1 F (36.7 C)     Temp Source 03/03/16 1136 Oral     SpO2 03/03/16 1136 97 %     Weight 03/03/16 1136 190 lb (86.183 kg)  Height 03/03/16 1136 5\' 5"   (1.651 m)     Head Cir --      Peak Flow --      Pain Score 03/03/16 1139 10     Pain Loc --      Pain Edu? --      Excl. in [place]? --      Constitutional: Alert and oriented. Well appearing and in no acute distress. Eyes: Conjunctivae are normal.  Head: Atraumatic. Cardiovascular:  Good peripheral circulation with brisk 2+ pulses noted in the right lower extremity. Respiratory: Normal respiratory effort without tachypnea or retractions.  Musculoskeletal: Diffuse tenderness to palpation about the right ankle and proximal right foot. Negative posterior and anterior drawer test but tender with this maneuver. Full range of motion of the right toes. Motion of the right knee. No lower extremity edema.  No joint effusions. Neurologic:  Normal speech and language. No gross focal neurologic deficits are appreciated. Sensation intact in the right lower extremity. Skin:  Mild bruising and moderate swelling noted about diffuse right ankle and proximal right foot. Skin is warm, dry and intact. No rash, skin sores, open wounds noted. Psychiatric: Mood and affect are normal. Speech and behavior are normal. Patient exhibits appropriate insight and judgement.   ____________________________________________   LABS  None ____________________________________________  EKG  None ____________________________________________  RADIOLOGY I have personally viewed and evaluated these images (plain radiographs) as part of my medical decision making, as well as reviewing the written report by the radiologist.  Dg Ankle Complete Right  03/03/2016  CLINICAL DATA:  Pt states no chance of preg.-shielded. Pt stepped in a hole on Thursday- pain in ankle along with swelling. Pt states when she fell she heard a pop. Best images obtained- pt unable to flex ankle much at all. EXAM: RIGHT ANKLE - COMPLETE 3+ VIEW COMPARISON:  01/07/2014, foot and tib-fib, 07/30/2013 FINDINGS: There is diffuse soft tissue swelling of the  ankle, more focal along the lateral aspect of the joint. There are degenerative changes along the medial aspect of the joint. No evidence for acute fracture or subluxation. Note is made of plantar and small Achilles calcaneal spurs. IMPRESSION: 1. Diffuse soft tissue swelling. 2. Degenerative changes. 3.  No evidence for acute fracture. Electronically Signed   By: Nolon Nations M.D.   On: 03/03/2016 12:35    ____________________________________________    PROCEDURES  Procedure(s) performed: None      Medications - No data to display   ____________________________________________   INITIAL IMPRESSION / ASSESSMENT AND PLAN / ED COURSE  Pertinent imaging results that were available during my care of the patient were reviewed by me and considered in my medical decision making (see chart for details).  Patient's diagnosis is consistent with right ankle sprain. Patient's right ankle and foot were wrapped in Ace wrap. She declined crutches today she has some at home to utilize. She should elevate the right lower extremity when not ambulating. Apply ice to the affected area 20 minutes 3-4 times daily. Patient will be discharged home with prescriptions for ibuprofen to take as directed. Patient is to follow up with Dr. Roland Rack in orthopedics or her primary care provider if symptoms persist past this treatment course. Patient is given ED precautions to return to the ED for any worsening or new symptoms.      ____________________________________________  FINAL CLINICAL IMPRESSION(S) / ED DIAGNOSES  Final diagnoses:  Right ankle sprain, initial encounter      NEW MEDICATIONS STARTED DURING THIS VISIT:  Discharge Medication List as of 03/03/2016 12:53 PM           Braxton Feathers, PA-C 03/03/16 [place], MD 03/03/16 [phone]

## 2016-03-03 NOTE — ED Notes (Signed)
NAD noted at time of D/C. Pt taken to lobby via wheelchair by her husband. Pt denies comments/concerns at this time.

## 2016-03-03 NOTE — ED Notes (Signed)
Pt states she stepped in a hole on Thursday, woke up with sharp stabbing pain today and swelling to right ankle.  Unable to bear weight now.

## 2016-03-03 NOTE — Discharge Instructions (Signed)
Ankle Sprain °An ankle sprain is an injury to the strong, fibrous tissues (ligaments) that hold the bones of your ankle joint together.  °CAUSES °An ankle sprain is usually caused by a fall or by twisting your ankle. Ankle sprains most commonly occur when you step on the outer edge of your foot, and your ankle turns inward. People who participate in sports are more prone to these types of injuries.  °SYMPTOMS  °· Pain in your ankle. The pain may be present at rest or only when you are trying to stand or walk. °· Swelling. °· Bruising. Bruising may develop immediately or within 1 to 2 days after your injury. °· Difficulty standing or walking, particularly when turning corners or changing directions. °DIAGNOSIS  °Your caregiver will ask you details about your injury and perform a physical exam of your ankle to determine if you have an ankle sprain. During the physical exam, your caregiver will press on and apply pressure to specific areas of your foot and ankle. Your caregiver will try to move your ankle in certain ways. An X-ray exam may be done to be sure a bone was not broken or a ligament did not separate from one of the bones in your ankle (avulsion fracture).  °TREATMENT  °Certain types of braces can help stabilize your ankle. Your caregiver can make a recommendation for this. Your caregiver may recommend the use of medicine for pain. If your sprain is severe, your caregiver may refer you to a surgeon who helps to restore function to parts of your skeletal system (orthopedist) or a physical therapist. °HOME CARE INSTRUCTIONS  °· Apply ice to your injury for 1-2 days or as directed by your caregiver. Applying ice helps to reduce inflammation and pain. °· Put ice in a plastic bag. °· Place a towel between your skin and the bag. °· Leave the ice on for 15-20 minutes at a time, every 2 hours while you are awake. °· Only take over-the-counter or prescription medicines for pain, discomfort, or fever as directed by  your caregiver. °· Elevate your injured ankle above the level of your heart as much as possible for 2-3 days. °· If your caregiver recommends crutches, use them as instructed. Gradually put weight on the affected ankle. Continue to use crutches or a cane until you can walk without feeling pain in your ankle. °· If you have a plaster splint, wear the splint as directed by your caregiver. Do not rest it on anything harder than a pillow for the first 24 hours. Do not put weight on it. Do not get it wet. You may take it off to take a shower or bath. °· You may have been given an elastic bandage to wear around your ankle to provide support. If the elastic bandage is too tight (you have numbness or tingling in your foot or your foot becomes cold and blue), adjust the bandage to make it comfortable. °· If you have an air splint, you may blow more air into it or let air out to make it more comfortable. You may take your splint off at night and before taking a shower or bath. Wiggle your toes in the splint several times per day to decrease swelling. °SEEK MEDICAL CARE IF:  °· You have rapidly increasing bruising or swelling. °· Your toes feel extremely cold or you lose feeling in your foot. °· Your pain is not relieved with medicine. °SEEK IMMEDIATE MEDICAL CARE IF: °· Your toes are numb or blue. °·   You have severe pain that is increasing. MAKE SURE YOU:   Understand these instructions.  Will watch your condition.  Will get help right away if you are not doing well or get worse.   This information is not intended to replace advice given to you by your health care provider. Make sure you discuss any questions you have with your health care provider.   Document Released: 10/28/2005 Document Revised: 11/18/2014 Document Reviewed: 11/09/2011 Elsevier Interactive Patient Education 2016 Else[employer]  Cryotherapy Cryotherapy is when you put ice on your injury. Ice helps lessen pain and puffiness (swelling) after an  injury. Ice works the best when you start using it in the first 24 to 48 hours after an injury. HOME CARE  Put a dry or damp towel between the ice pack and your skin.  You may press gently on the ice pack.  Leave the ice on for no more than 10 to 20 minutes at a time.  Check your skin after 5 minutes to make sure your skin is okay.  Rest at least 20 minutes between ice pack uses.  Stop using ice when your skin loses feeling (numbness).  Do not use ice on someone who cannot tell you when it hurts. This includes small children and people with memory problems (dementia). GET HELP RIGHT AWAY IF:  You have white spots on your skin.  Your skin turns blue or pale.  Your skin feels waxy or hard.  Your puffiness gets worse. MAKE SURE YOU:   Understand these instructions.  Will watch your condition.  Will get help right away if you are not doing well or get worse.   This information is not intended to replace advice given to you by your health care provider. Make sure you discuss any questions you have with your health care provider.   Document Released: 04/15/2008 Document Revised: 01/20/2012 Document Reviewed: 06/20/2011 Elsevier Interactive Patient Education 2016 [place] DO? Elastic bandages come in different shapes and sizes. They generally provide support to your injury and reduce swelling while you are healing, but they can perform different functions. Your health care provider will help you to decide what is best for your protection, recovery, or rehabilitation following an injury. WHAT ARE SOME GENERAL TIPS FOR USING AN ELASTIC BANDAGE?  Use the bandage as directed by the maker of the bandage that you are using.  Do not wrap the bandage too tightly. This may cut off the circulation in the arm or leg in the area below the bandage.  If part of your body beyond the bandage becomes blue, numb, cold, swollen, or is  more painful, your bandage is most likely too tight. If this occurs, remove your bandage and reapply it more loosely.  See your health care provider if the bandage seems to be making your problems worse rather than better.  An elastic bandage should be removed and reapplied every 3-4 hours or as directed by your health care provider. WHAT IS RICE? The routine care of many injuries includes rest, ice, compression, and elevation (RICE therapy).  Rest Rest is required to allow your body to heal. Generally, you can resume your routine activities when you are comfortable and have been given permission by your health care provider. Ice Icing your injury helps to keep the swelling down and it reduces pain. Do not apply ice directly to your skin.  Put ice in a plastic bag.  Place  a towel between your skin and the bag.  Leave the ice on for 20 minutes, 2-3 times per day. Do this for as long as you are directed by your health care provider. Compression Compression helps to keep swelling down, gives support, and helps with discomfort. Compression may be done with an elastic bandage. Elevation Elevation helps to reduce swelling and it decreases pain. If possible, your injured area should be placed at or above the level of your heart or the center of your chest. [place]? You should seek medical care if:  You have persistent pain and swelling.  Your symptoms are getting worse rather than improving. These symptoms may indicate that further evaluation or further X-rays are needed. Sometimes, X-rays may not show a small broken bone (fracture) until a number of days later. Make a follow-up appointment with your health care provider. Ask when your X-ray results will be ready. Make sure that you get your X-ray results. WHEN SHOULD I SEEK IMMEDIATE MEDICAL CARE? You should seek immediate medical care if:  You have a sudden onset of severe pain at or below the area of your  injury.  You develop redness or increased swelling around your injury.  You have tingling or numbness at or below the area of your injury that does not improve after you remove the elastic bandage.   This information is not intended to replace advice given to you by your health care provider. Make sure you discuss any questions you have with your health care provider.   Document Released: 04/19/2002 Document Revised: 07/19/2015 Document Reviewed: 06/13/2014 Elsevier Interactive Patient Education [employer].

## 2016-03-26 ENCOUNTER — Telehealth: Payer: Self-pay | Admitting: Internal Medicine

## 2016-03-26 NOTE — Telephone Encounter (Signed)
Pharmacy currently closed. Will call in AM to verify Rx needed.

## 2016-03-27 NOTE — Telephone Encounter (Signed)
lmtcb for pharmacy to verify rx.

## 2016-03-28 MED ORDER — MOMETASONE FURO-FORMOTEROL FUM 200-5 MCG/ACT IN AERO: 2.0000 | INHALATION_SPRAY | Freq: Two times a day (BID) | RESPIRATORY_TRACT | Status: DC

## 2016-03-28 NOTE — Telephone Encounter (Signed)
Spoke with pharmacist and informed that rx for [facility] was sent but pt will need ov with Dr Stevenson Clinch to discuss change to Advair since last ov was 06/2015.

## 2016-03-28 NOTE — Telephone Encounter (Signed)
Remo Lipps called back- she is requesting an new rx for [facility] to be sent - patient transferring all her prescriptions to them.Remo Lipps also was checking to see if the doctor would be willing to change Dulera to Advair due to cost for the patient. If not, okay to just fax rx for [facility]. Ph# [phone] Fax# [phone]. -prm

## 2016-05-31 ENCOUNTER — Encounter: Payer: Self-pay | Admitting: Emergency Medicine

## 2016-05-31 ENCOUNTER — Emergency Department
Admission: EM | Admit: 2016-05-31 | Discharge: 2016-05-31 | Disposition: A | Discharge status: Home / Self Care | Payer: Medicare Other | Attending: Emergency Medicine | Admitting: Emergency Medicine

## 2016-05-31 DIAGNOSIS — T7840XA Allergy, unspecified, initial encounter: Secondary | ICD-10-CM | POA: Insufficient documentation

## 2016-05-31 DIAGNOSIS — Z79899 Other long term (current) drug therapy: Secondary | ICD-10-CM | POA: Insufficient documentation

## 2016-05-31 DIAGNOSIS — T63441A Toxic effect of venom of bees, accidental (unintentional), initial encounter: Secondary | ICD-10-CM | POA: Insufficient documentation

## 2016-05-31 DIAGNOSIS — J449 Chronic obstructive pulmonary disease, unspecified: Secondary | ICD-10-CM | POA: Insufficient documentation

## 2016-05-31 DIAGNOSIS — I10 Essential (primary) hypertension: Secondary | ICD-10-CM | POA: Diagnosis not present

## 2016-05-31 MED ORDER — ELETRIPTAN HYDROBROMIDE 20 MG PO TABS: 20.0000 mg | ORAL_TABLET | ORAL | Status: DC | PRN

## 2016-05-31 MED ORDER — ELETRIPTAN HYDROBROMIDE 20 MG PO TABS
20.0000 mg | ORAL_TABLET | Freq: Once | ORAL | Status: AC
  Administered 2016-05-31: 20 mg via ORAL
  Filled 2016-05-31: qty 1

## 2016-05-31 MED ORDER — EPINEPHRINE 0.3 MG/0.3ML IJ SOAJ: 0.3000 mg | Freq: Once | INTRAMUSCULAR | Status: DC

## 2016-05-31 MED ORDER — RANITIDINE HCL 150 MG/10ML PO SYRP
150.0000 mg | ORAL_SOLUTION | Freq: Once | ORAL | Status: AC
  Administered 2016-05-31: 150 mg via ORAL
  Filled 2016-05-31: qty 10

## 2016-05-31 MED ORDER — METHYLPREDNISOLONE SODIUM SUCC 125 MG IJ SOLR
125.0000 mg | Freq: Once | INTRAMUSCULAR | Status: AC
  Administered 2016-05-31: 125 mg via INTRAVENOUS
  Filled 2016-05-31: qty 2

## 2016-05-31 MED ORDER — DIPHENHYDRAMINE HCL 50 MG/ML IJ SOLN
50.0000 mg | Freq: Once | INTRAMUSCULAR | Status: AC
  Administered 2016-05-31: 50 mg via INTRAVENOUS
  Filled 2016-05-31: qty 1

## 2016-05-31 MED ORDER — SODIUM CHLORIDE 0.9 % IV BOLUS (SEPSIS)
1000.0000 mL | Freq: Once | INTRAVENOUS | Status: AC
  Administered 2016-05-31: 1000 mL via INTRAVENOUS

## 2016-05-31 MED ORDER — PREDNISONE 20 MG PO TABS
40.0000 mg | ORAL_TABLET | Freq: Every day | ORAL | Status: DC
Start: 2016-05-31 — End: 2016-10-10
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
## 2016-05-31 NOTE — ED Notes (Signed)
Reports getting stung by a bee and gave herself an epi shot

## 2016-05-31 NOTE — Discharge Instructions (Signed)
Please seek medical attention for any high fevers, chest pain, shortness of breath, change in behavior, persistent vomiting, bloody stool or any other new or concerning symptoms.   Allergies An allergy is when your body reacts to a substance in a way that is not normal. An allergic reaction can happen after you:  Eat something.  Breathe in something.  Touch something. WHAT KINDS OF ALLERGIES ARE THERE? You can be allergic to:  Things that are only around during certain seasons, like molds and pollens.  Foods.  Drugs.  Insects.  Animal dander. WHAT ARE SYMPTOMS OF ALLERGIES?  Puffiness (swelling). This may happen on the lips, face, tongue, mouth, or throat.  Sneezing.  Coughing.  Breathing loudly (wheezing).  Stuffy nose.  Tingling in the mouth.  A rash.  Itching.  Itchy, red, puffy areas of skin (hives).  Watery eyes.  Throwing up (vomiting).  Watery poop (diarrhea).  Dizziness.  Feeling faint or fainting.  Trouble breathing or swallowing.  A tight feeling in the chest.  A fast heartbeat. HOW ARE ALLERGIES DIAGNOSED? Allergies can be diagnosed with:  A medical and family history.  Skin tests.  Blood tests.  A food diary. A food diary is a record of all the foods, drinks, and symptoms you have each day.  The results of an elimination diet. This diet involves making sure not to eat certain foods and then seeing what happens when you start eating them again. HOW ARE ALLERGIES TREATED? There is no cure for allergies, but allergic reactions can be treated with medicine. Severe reactions usually need to be treated at a hospital.  HOW CAN REACTIONS BE PREVENTED? The best way to prevent an allergic reaction is to avoid the thing you are allergic to. Allergy shots and medicines can also help prevent reactions in some cases.   This information is not intended to replace advice given to you by your health care provider. Make sure you discuss any  questions you have with your health care provider.   Document Released: 02/22/2013 Document Revised: 11/18/2014 Document Reviewed: 08/09/2014 Elsevier Interactive Patient Education [employer].

## 2016-06-17 ENCOUNTER — Ambulatory Visit: Payer: Medicare Other

## 2016-06-25 ENCOUNTER — Encounter: Payer: Medicare Other | Admitting: Pharmacist

## 2016-06-28 ENCOUNTER — Encounter: Payer: Medicare Other | Admitting: Pharmacist

## 2016-08-05 ENCOUNTER — Ambulatory Visit: Payer: Self-pay | Admitting: Allergy & Immunology

## 2016-09-04 DIAGNOSIS — J019 Acute sinusitis, unspecified: Secondary | ICD-10-CM | POA: Diagnosis not present

## 2016-09-04 DIAGNOSIS — R05 Cough: Secondary | ICD-10-CM | POA: Diagnosis not present

## 2016-10-10 ENCOUNTER — Emergency Department: Payer: PPO

## 2016-10-10 ENCOUNTER — Emergency Department
Admission: EM | Admit: 2016-10-10 | Discharge: 2016-10-10 | Disposition: A | Discharge status: Home / Self Care | Payer: PPO | Attending: Student in an Organized Health Care Education/Training Program | Admitting: Student in an Organized Health Care Education/Training Program

## 2016-10-10 ENCOUNTER — Encounter: Payer: Self-pay | Admitting: Emergency Medicine

## 2016-10-10 DIAGNOSIS — I10 Essential (primary) hypertension: Secondary | ICD-10-CM | POA: Diagnosis not present

## 2016-10-10 DIAGNOSIS — J449 Chronic obstructive pulmonary disease, unspecified: Secondary | ICD-10-CM | POA: Diagnosis not present

## 2016-10-10 DIAGNOSIS — M5432 Sciatica, left side: Secondary | ICD-10-CM

## 2016-10-10 DIAGNOSIS — M5442 Lumbago with sciatica, left side: Secondary | ICD-10-CM | POA: Insufficient documentation

## 2016-10-10 DIAGNOSIS — Z791 Long term (current) use of non-steroidal anti-inflammatories (NSAID): Secondary | ICD-10-CM | POA: Insufficient documentation

## 2016-10-10 DIAGNOSIS — M545 Low back pain: Secondary | ICD-10-CM | POA: Diagnosis not present

## 2016-10-10 DIAGNOSIS — Z79899 Other long term (current) drug therapy: Secondary | ICD-10-CM | POA: Diagnosis not present

## 2016-10-10 LAB — POCT PREGNANCY, URINE: PREG TEST UR: NEGATIVE

## 2016-10-10 MED ORDER — KETOROLAC TROMETHAMINE 30 MG/ML IJ SOLN
30.0000 mg | Freq: Once | INTRAMUSCULAR | Status: AC
  Administered 2016-10-10: 30 mg via INTRAMUSCULAR
  Filled 2016-10-10: qty 1

## 2016-10-10 MED ORDER — CYCLOBENZAPRINE HCL 10 MG PO TABS: 10.0000 mg | ORAL_TABLET | Freq: Three times a day (TID) | ORAL | 0 refills | Status: DC | PRN

## 2016-10-10 MED ORDER — ORPHENADRINE CITRATE 30 MG/ML IJ SOLN
60.0000 mg | Freq: Two times a day (BID) | INTRAMUSCULAR | Status: DC
  Administered 2016-10-10: 60 mg via INTRAMUSCULAR
  Filled 2016-10-10: qty 2

## 2016-10-10 MED ORDER — PREDNISONE 10 MG PO TABS: ORAL_TABLET | ORAL | 0 refills | Status: DC

## 2016-10-10 MED ORDER — HYDROCODONE-ACETAMINOPHEN 5-325 MG PO TABS
2.0000 | ORAL_TABLET | Freq: Once | ORAL | Status: AC
  Administered 2016-10-10: 2 via ORAL
  Filled 2016-10-10: qty 2

## 2016-10-10 MED ORDER — HYDROCODONE-ACETAMINOPHEN 5-325 MG PO TABS: 1.0000 | ORAL_TABLET | Freq: Four times a day (QID) | ORAL | 0 refills | Status: DC | PRN

## 2016-10-10 NOTE — ED Triage Notes (Signed)
Reports lower back pain onset last pm, radiates down both legs.
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
## 2016-10-10 NOTE — ED Notes (Signed)
PT reports she began having lower back pain yesterday, pain radiates down into both buttocks and down legs. Denies injury.

## 2016-11-11 DIAGNOSIS — J209 Acute bronchitis, unspecified: Secondary | ICD-10-CM | POA: Diagnosis not present

## 2016-12-25 ENCOUNTER — Other Ambulatory Visit: Payer: Self-pay

## 2016-12-25 MED ORDER — FUROSEMIDE 20 MG PO TABS: 20.0000 mg | ORAL_TABLET | Freq: Two times a day (BID) | ORAL | 1 refills | Status: DC | PRN

## 2016-12-25 NOTE — Telephone Encounter (Signed)
Requested Prescriptions   Signed Prescriptions Disp Refills  . furosemide (LASIX) 20 MG tablet 180 tablet 1    Sig: Take 1 tablet (20 mg total) by mouth 2 (two) times daily as needed.    Authorizing Provider: Minna Merritts    Ordering User: Janan Ridge

## 2017-01-08 ENCOUNTER — Ambulatory Visit: Payer: Self-pay | Attending: Oncology | Admitting: *Deleted

## 2017-01-08 ENCOUNTER — Encounter (INDEPENDENT_AMBULATORY_CARE_PROVIDER_SITE_OTHER): Payer: Self-pay

## 2017-01-08 ENCOUNTER — Encounter: Payer: Self-pay | Admitting: *Deleted

## 2017-01-08 ENCOUNTER — Ambulatory Visit
Admission: RE | Admit: 2017-01-08 | Discharge: 2017-01-08 | Disposition: A | Discharge status: Home / Self Care | Payer: Self-pay | Source: Ambulatory Visit | Attending: Oncology | Admitting: Oncology

## 2017-01-08 VITALS — BP 107/78 | HR 82 | Temp 98.0°F | Ht 64.96 in | Wt 163.8 lb

## 2017-01-08 DIAGNOSIS — Z0000 Encounter for general adult medical examination without abnormal findings: Secondary | ICD-10-CM

## 2017-01-08 NOTE — Progress Notes (Signed)
Subjective:     Patient ID: Melinda Shaw, female   DOB: [birth year], 51 y.o.   MRN: [id number]  HPI   Review of Systems     Objective:   Physical Exam  Pulmonary/Chest: Right breast exhibits no inverted nipple, no mass, no nipple discharge, no skin change and no tenderness. Left breast exhibits no inverted nipple, no mass, no nipple discharge, no skin change and no tenderness. Breasts are symmetrical.  Abdominal: There is no splenomegaly or hepatomegaly.  Genitourinary: Rectal exam shows no mass. No labial fusion. There is no rash, tenderness, lesion or injury on the right labia. There is no rash, lesion or injury on the left labia. Uterus is not deviated, not enlarged, not fixed and not tender. Cervix exhibits no motion tenderness, no discharge and no friability. Right adnexum displays no mass, no tenderness and no fullness. Left adnexum displays no mass, no tenderness and no fullness. No erythema, tenderness or bleeding in the vagina. No foreign body in the vagina. No signs of injury around the vagina. No vaginal discharge found.  Genitourinary Comments: Very stenosed cervical os       Assessment:     51 year old White female referred to [place] by [facility] for baseline screening and pap.  Clinical breast exam unremarkable.  Taught self breast awareness.  Specimen collected for pap smear without difficulty.  Patient has been screened for eligibility.  She does not have any insurance, Medicare or Medicaid.  She also meets financial eligibility.  Hand-out given on the Affordable Care Act.    Plan:     Screening mammogram ordered.  Specimen for pap smear sent to the lab.  Will follow up per [facility] protocol.

## 2017-01-08 NOTE — Patient Instructions (Signed)
HPV Test The human papillomavirus (HPV) test is used to look for high-risk types of HPV infection. HPV is a group of about 100 viruses. Many of these viruses cause growths on, in, or around the genitals. Most HPV viruses cause infections that usually go away without treatment. However, HPV types 6, 11, 16, and 18 are considered high-risk types of HPV that can increase your risk of cancer of the cervix or anus if the infection is left untreated. An HPV test identifies the DNA (genetic) strands of the HPV infection, so it is also referred to as the HPV DNA test. Although HPV is found in both males and females, the HPV test is only used to screen for increased cancer risk in females:  With an abnormal Pap test.  After treatment of an abnormal Pap test.  Between the ages of 30 and 65.  After treatment of a high-risk HPV infection. The HPV test may be done at the same time as a pelvic exam and Pap test in females over the age of 30. Both the HPV test and Pap test require a sample of cells from the cervix. How do I prepare for this test?  Do not douche or take a bath for 24-48 hours before the test or as directed by your health care provider.  Do not have sex for 24-48 hours before the test or as directed by your health care provider.  You may be asked to reschedule the test if you are menstruating.  You will be asked to urinate before the test. What do the results mean? It is your responsibility to obtain your test results. Ask the lab or department performing the test when and how you will get your results. Talk with your health care provider if you have any questions about your results. Your result will be negative or positive. Meaning of Negative Test Results  A negative HPV test result means that no HPV was found, and it is very likely that you do not have HPV. Meaning of Positive Test Results  A positive HPV test result indicates that you have HPV.  If your test result shows the presence  of any high-risk HPV strains, you may have an increased risk of developing cancer of the cervix or anus if the infection is left untreated.  If any low-risk HPV strains are found, you are not likely to have an increased risk of cancer. Discuss your test results with your health care provider. He or she will use the results to make a diagnosis and determine a treatment plan that is right for you. Talk with your health care provider to discuss your results, treatment options, and if necessary, the need for more tests. Talk with your health care provider if you have any questions about your results. This information is not intended to replace advice given to you by your health care provider. Make sure you discuss any questions you have with your health care provider. Document Released: 11/22/2004 Document Revised: 07/03/2016 Document Reviewed: 03/15/2014 Elsevier Interactive Patient Education  2017 Elsevier Inc.  Gave patient hand-out, Women Staying Healthy, Active and Well from [facility], with education on breast health, pap smears, heart and colon health.  

## 2017-01-09 ENCOUNTER — Ambulatory Visit
Admission: RE | Admit: 2017-01-09 | Discharge: 2017-01-09 | Disposition: A | Discharge status: Home / Self Care | Payer: PPO | Source: Ambulatory Visit | Attending: Pulmonary Disease | Admitting: Pulmonary Disease

## 2017-01-09 ENCOUNTER — Encounter: Payer: Self-pay | Admitting: Pulmonary Disease

## 2017-01-09 ENCOUNTER — Institutional Professional Consult (permissible substitution): Payer: Medicare Other | Admitting: Pulmonary Disease

## 2017-01-09 ENCOUNTER — Ambulatory Visit (INDEPENDENT_AMBULATORY_CARE_PROVIDER_SITE_OTHER): Payer: PPO | Admitting: Pulmonary Disease

## 2017-01-09 VITALS — BP 118/68 | HR 76 | Wt 162.0 lb

## 2017-01-09 DIAGNOSIS — R5381 Other malaise: Secondary | ICD-10-CM | POA: Diagnosis not present

## 2017-01-09 DIAGNOSIS — J471 Bronchiectasis with (acute) exacerbation: Secondary | ICD-10-CM | POA: Diagnosis not present

## 2017-01-09 DIAGNOSIS — R0602 Shortness of breath: Secondary | ICD-10-CM

## 2017-01-09 DIAGNOSIS — R0600 Dyspnea, unspecified: Secondary | ICD-10-CM

## 2017-01-09 DIAGNOSIS — R918 Other nonspecific abnormal finding of lung field: Secondary | ICD-10-CM | POA: Diagnosis not present

## 2017-01-09 DIAGNOSIS — R0789 Other chest pain: Secondary | ICD-10-CM | POA: Diagnosis not present

## 2017-01-09 MED ORDER — FLUTTER DEVI: 0 refills | Status: DC

## 2017-01-09 NOTE — Patient Instructions (Signed)
Spirometry today Chest x-ray ordered-I will call you after I have reviewed it tomorrow I have ordered a flutter valve to be used as needed to help mobilize secretions Follow-up in 2-3 weeks

## 2017-01-09 NOTE — Progress Notes (Signed)
PULMONARY OFFICE FOLLOW UP NOTE  PROBLEMS:  Very mild bronchiectasis Lung nodules Recurrent bronchitis   PT PROFILE: 51 y.o. female never smoker, former patient of Dr. Stevenson Clinch who underwent extensive evaluation in 2016 for persistent dyspnea, mucus production.   DATA: 05/19/2015 CT chest: There is diffuse bronchial wall thickening and scattered areas of mild cylindrical bronchiectasis which are predominantly in the lung bases bilaterally. Two new nonspecific pulmonary nodules in the right lower lobe, largest of which measures only 4 mm 04/25/2015 PFTs: No obstruction, mild restriction, normal DLCO 05/29/2015 bronchoscopy: Copious mucoid secretions throughout. BAL grew Proteus  INTERVAL HISTORY: She was doing well until approximately one month prior to this visit with the complaints described below  SUBJ: [name] returns today for re-evaluation. She has been "sick" for approximately 1 month. At the onset of this illness she had low-grade fever with chest tightness and increased mucus production. She described her sputum as white to yellow. She also notes increased shortness of breath and fatigue. She was initially seen at an urgent care center and was treated with redness on and a azithromycin. Her symptoms did not improve and she has been subsequently seen on 2 other occasions by her primary care physician. On both occasions she has been treated with antibiotics and a course of prednisone, again with minimal improvement. Presently she states that she feels "full" in her chest but is unable to cough up much mucus. She also reports profound fatigue and ongoing exertional dyspnea. She denies fever presently. She has no hemoptysis. She denies pleuritic and anginal chest pain. She denies lower extremity edema and calf tenderness. She is a never smoker.  OBJ: Vitals:   01/09/17 1618  BP: 118/68  Pulse: 76  SpO2: 98%  Weight: 162 lb (73.5 kg)  RA  Gen: WDWN in NAD HEENT: All WNL Neck: NO  LAN, no JVD noted Lungs: full BS, normal percussion note throughout, no adventitious sounds Cardiovascular: Reg rate, normal rhythm, no M noted Abdomen: Soft, NT +BS Ext: no C/C/E Neuro: CNs intact, motor/sens grossly intact Skin: No lesions noted   DATA: No new labs her chest x-ray  IMPRESSION: 1) mild bronchiectasis 2) incidental finding of small lung nodules on prior CT scan 3) One month illness with shortness of breath and chest congestion as well as generalized malaise but with entirely normal examination  PLAN: Spirometry ordered Chest x-ray ordered Flutter valve ordered Follow-up in 2-3 weeks. I will call her with any findings on chest x-ray or spirometry and we will adjust our plan as needed  Merton Border, MD PCCM service Mobile [phone] Pager [phone] 01/09/2017

## 2017-01-10 ENCOUNTER — Telehealth: Payer: Self-pay | Admitting: Pulmonary Disease

## 2017-01-10 DIAGNOSIS — R0602 Shortness of breath: Secondary | ICD-10-CM | POA: Diagnosis not present

## 2017-01-10 DIAGNOSIS — R911 Solitary pulmonary nodule: Secondary | ICD-10-CM | POA: Diagnosis not present

## 2017-01-10 DIAGNOSIS — J479 Bronchiectasis, uncomplicated: Secondary | ICD-10-CM | POA: Diagnosis not present

## 2017-01-10 MED ORDER — FLUTTER DEVI: 0 refills | Status: DC

## 2017-01-10 NOTE — Telephone Encounter (Signed)
[facility] Boot[facility] asking if we can send over a prescription that has the DX code on it for Flutter Pt is at the store right now.  Please advise.

## 2017-01-10 NOTE — Telephone Encounter (Signed)
rx signed and faxed to [facility]. Nothing further needed.

## 2017-01-11 LAB — PAP LB AND HPV HIGH-RISK
HPV, high-risk: NEGATIVE
PAP Smear Comment: 0

## 2017-01-27 ENCOUNTER — Institutional Professional Consult (permissible substitution): Payer: Medicare Other | Admitting: Pulmonary Disease

## 2017-01-28 ENCOUNTER — Ambulatory Visit (INDEPENDENT_AMBULATORY_CARE_PROVIDER_SITE_OTHER): Payer: PPO | Admitting: Pulmonary Disease

## 2017-01-28 ENCOUNTER — Ambulatory Visit: Payer: PPO | Admitting: Pulmonary Disease

## 2017-01-28 ENCOUNTER — Encounter: Payer: Self-pay | Admitting: Pulmonary Disease

## 2017-01-28 VITALS — BP 124/64 | HR 85 | Wt 162.0 lb

## 2017-01-28 DIAGNOSIS — J471 Bronchiectasis with (acute) exacerbation: Secondary | ICD-10-CM

## 2017-01-28 MED ORDER — LEVOFLOXACIN 500 MG PO TABS: 500.0000 mg | ORAL_TABLET | Freq: Every day | ORAL | 0 refills | Status: AC

## 2017-01-28 NOTE — Patient Instructions (Addendum)
Continue Dulera, albuterol and flutter valve as currently ordered Levofloxacin 500 mg daily for 7 days Mucinex twice a day for 7 days, then as needed to thin out secretions Follow up in 6-8 weeks

## 2017-01-29 ENCOUNTER — Encounter: Payer: Self-pay | Admitting: *Deleted

## 2017-01-29 NOTE — Progress Notes (Signed)
Letter mailed to inform patient of her normal mammogram and pap smear.  Next mammo due in 1 year and next pap due in 5 years.  HSIS to [place].

## 2017-01-30 NOTE — Progress Notes (Signed)
PULMONARY OFFICE FOLLOW UP NOTE  PROBLEMS:  Very mild bronchiectasis Lung nodules Recurrent bronchitis   PT PROFILE: 51 y.o. female never smoker, former patient of Dr. Stevenson Clinch who underwent extensive evaluation in 2016 for persistent dyspnea, mucus production.   DATA: 05/19/2015 CT chest: There is diffuse bronchial wall thickening and scattered areas of mild cylindrical bronchiectasis which are predominantly in the lung bases bilaterally. Two new nonspecific pulmonary nodules in the right lower lobe, largest of which measures only 4 mm 04/25/2015 PFTs: No obstruction, mild restriction, normal DLCO 05/29/2015 bronchoscopy: Copious mucoid secretions throughout. BAL grew Proteus 01/10/17 Spirometry: FVC 3.05(84%), FEV1 2.52 (88%), FEV1/FVC 83%  INTERVAL HISTORY: No major events  SUBJ: Reports that she is doing "so-so". Still with sense of chest tightness and cough with scant yellow mucus. She is using albuterol MDI 1-2 x/day and is using flutter valve 3-4 times per day without much mobilization of secretions. Denies CP, fever, purulent sputum, hemoptysis, LE edema and calf tenderness.  OBJ: Vitals:   01/28/17 1149  BP: 124/64  Pulse: 85  SpO2: 99%  Weight: 162 lb (73.5 kg)  RA  Gen: WDWN in NAD HEENT: All WNL Neck: NO LAN, no JVD noted Lungs: full BS, normal percussion note, no wheezes Cardiovascular: Reg, no M noted Abdomen: Soft, NT +BS Ext: no C/C/E Neuro: grossly intact   DATA: CXR 03/01: NACPD  IMPRESSION: 1) mild bronchiectasis with mild acute exacerbation (yellow mucus and chest tightness) 2) incidental finding of small lung nodules on prior CT scan 07/16 - no evidence of progression. Deemed benign  PLAN: Continue Dulera and PRN albuterol Continue Flutter valve Levofloxacin 500 mg X 7 days Guaifenesin 600 mg BID X 7 days, then PRN thereafter - samples provided Follow-up in 6-8 weeks  Merton Border, MD PCCM service Mobile [phone] Pager  [phone] 01/30/2017

## 2017-03-05 ENCOUNTER — Encounter: Payer: Self-pay | Admitting: *Deleted

## 2017-03-05 ENCOUNTER — Emergency Department
Admission: EM | Admit: 2017-03-05 | Discharge: 2017-03-05 | Disposition: A | Discharge status: Home / Self Care | Payer: PPO | Attending: Emergency Medicine | Admitting: Emergency Medicine

## 2017-03-05 DIAGNOSIS — J449 Chronic obstructive pulmonary disease, unspecified: Secondary | ICD-10-CM | POA: Diagnosis not present

## 2017-03-05 DIAGNOSIS — B029 Zoster without complications: Secondary | ICD-10-CM | POA: Insufficient documentation

## 2017-03-05 DIAGNOSIS — I10 Essential (primary) hypertension: Secondary | ICD-10-CM | POA: Insufficient documentation

## 2017-03-05 DIAGNOSIS — R21 Rash and other nonspecific skin eruption: Secondary | ICD-10-CM | POA: Diagnosis not present

## 2017-03-05 DIAGNOSIS — Z79899 Other long term (current) drug therapy: Secondary | ICD-10-CM | POA: Diagnosis not present

## 2017-03-05 DIAGNOSIS — Z791 Long term (current) use of non-steroidal anti-inflammatories (NSAID): Secondary | ICD-10-CM | POA: Insufficient documentation

## 2017-03-05 MED ORDER — HYDROCODONE-ACETAMINOPHEN 5-325 MG PO TABS
1.0000 | ORAL_TABLET | Freq: Once | ORAL | Status: AC
  Administered 2017-03-05: 1 via ORAL

## 2017-03-05 MED ORDER — HYDROCODONE-ACETAMINOPHEN 5-325 MG PO TABS: 1.0000 | ORAL_TABLET | ORAL | 0 refills | Status: DC | PRN

## 2017-03-05 MED ORDER — FAMCICLOVIR 500 MG PO TABS: 500.0000 mg | ORAL_TABLET | Freq: Three times a day (TID) | ORAL | 0 refills | Status: AC

## 2017-03-05 MED ORDER — HYDROCODONE-ACETAMINOPHEN 5-325 MG PO TABS
ORAL_TABLET | ORAL | Status: AC
  Administered 2017-03-05: 1 via ORAL
  Filled 2017-03-05: qty 1

## 2017-03-05 MED ORDER — ONDANSETRON 4 MG PO TBDP
4.0000 mg | ORAL_TABLET | Freq: Once | ORAL | Status: AC
  Administered 2017-03-05: 4 mg via ORAL
  Filled 2017-03-05: qty 1

## 2017-03-05 MED ORDER — ONDANSETRON 4 MG PO TBDP: 4.0000 mg | ORAL_TABLET | Freq: Three times a day (TID) | ORAL | 0 refills | Status: DC | PRN

## 2017-03-05 NOTE — ED Triage Notes (Signed)
Pt was bit by a tick 1 week ago, pt reports pain at site with low grade fever

## 2017-03-05 NOTE — ED Provider Notes (Signed)
[facility] Emergency Department Provider Note  ____________________________________________   None    (approximate)  I have reviewed the triage vital signs and the nursing notes.   HISTORY  Chief Complaint Insect Bite    HPI Melinda Shaw is a 51 y.o. female is here with complaint of tick bite that occurred 1 week ago. Patient states that she removed a tick from her right lateral abdomen without any difficulty. She states she has felt like she had a low-grade temp and has had decreased energy. She also has developed a rash that is very painful to the right lateral abdomen, anterior abdomen and back. Patient states that the place in which she got tick bite is no longer visible. She is unable to get comfortable because of the pain and cannot stand anything to touch her side. At this time she rates her pain as a 5 out of 10.   Past Medical History:  Diagnosis Date  . Anemia    H/O  . Anxiety   . COPD (chronic obstructive pulmonary disease) ([place])   . Depression   . Fibromyalgia   . GERD (gastroesophageal reflux disease)   . Headache   . Hyperlipidemia   . Hypertension    no meds  . PONV (postoperative nausea and vomiting)   . Shortness of breath dyspnea     Patient Active Problem List   Diagnosis Date Noted  . Lung infiltrate   . Pulmonary infiltrate in right lung on chest x-ray   . Pulmonary infiltrate in left lung on chest x-ray   . Reactive airway disease 03/21/2015  . Dyspnea 02/22/2015  . Cough 02/22/2015  . Acute upper respiratory infection 02/22/2015    Past Surgical History:  Procedure Laterality Date  . ANTERIOR CRUCIATE LIGAMENT REPAIR    . CHOLECYSTECTOMY    . ESOPHAGOGASTRODUODENOSCOPY (EGD) WITH PROPOFOL N/A 08/28/2015   Procedure: ESOPHAGOGASTRODUODENOSCOPY (EGD) WITH PROPOFOL;  Surgeon: Josefine Class, MD;  Location: [facility] ENDOSCOPY;  Service: Endoscopy;  Laterality: N/A;  . KNEE SURGERY     left knee   . MASS  EXCISION Left 01/26/2016   Procedure: EXCISION OF LEFT WRIST VOLAR MASS;  Surgeon: Charlotte Crumb, MD;  Location: [facility];  Service: Orthopedics;  Laterality: Left;  . NOSE SURGERY    . POLYPECTOMY    . TUBAL LIGATION    . VIDEO BRONCHOSCOPY N/A 05/29/2015   Procedure: VIDEO BRONCHOSCOPY WITHOUT FLUORO;  Surgeon: Vilinda Boehringer, MD;  Location: [facility] ORS;  Service: Cardiopulmonary;  Laterality: N/A;    Prior to Admission medications   Medication Sig Start Date End Date Taking? Authorizing Provider  albuterol (PROVENTIL HFA;VENTOLIN HFA) 108 (90 BASE) MCG/ACT inhaler Inhale 2 puffs into the lungs every 6 (six) hours as needed for wheezing or shortness of breath.    Historical Provider, MD  citalopram (CELEXA) 20 MG tablet Take 20 mg by mouth every morning.     Historical Provider, MD  clonazePAM (KLONOPIN) 1 MG tablet Take 1 mg by mouth 3 (three) times daily.    Historical Provider, MD  cyclobenzaprine (FLEXERIL) 10 MG tablet Take 1 tablet (10 mg total) by mouth 3 (three) times daily as needed for muscle spasms. 10/10/16   Jami L Hagler, PA-C  eletriptan (RELPAX) 20 MG tablet Take 20 mg by mouth every 2 (two) hours as needed.     Historical Provider, MD  EPINEPHrine (EPIPEN 2-PAK) 0.3 mg/0.3 mL IJ SOAJ injection Inject 0.3 mLs (0.3 mg total) into the  muscle once. 05/31/16   Nance Pear, MD  famciclovir (FAMVIR) 500 MG tablet Take 1 tablet (500 mg total) by mouth 3 (three) times daily. 03/05/17 03/12/17  Johnn Hai, PA-C  furosemide (LASIX) 20 MG tablet Take 1 tablet (20 mg total) by mouth 2 (two) times daily as needed. 12/25/16   Minna Merritts, MD  gabapentin (NEURONTIN) 100 MG capsule Take 200 mg by mouth at bedtime.     Historical Provider, MD  HYDROcodone-acetaminophen (NORCO/VICODIN) 5-325 MG tablet Take 1 tablet by mouth every 4 (four) hours as needed for moderate pain. 03/05/17   Johnn Hai, PA-C  ibuprofen (ADVIL,MOTRIN) 600 MG tablet Take 1 tablet (600 mg  total) by mouth every 6 (six) hours as needed. 03/03/16   Jami L Hagler, PA-C  mometasone-formoterol (DULERA) 200-5 MCG/ACT AERO Inhale 2 puffs into the lungs 2 (two) times daily. Patient taking differently: Inhale 2 puffs into the lungs daily.  03/28/16   Vishal Mungal, MD  omeprazole (PRILOSEC) 20 MG capsule Take 20 mg by mouth daily.    Historical Provider, MD  ondansetron (ZOFRAN ODT) 4 MG disintegrating tablet Take 1 tablet (4 mg total) by mouth every 8 (eight) hours as needed for nausea or vomiting. 03/05/17   Johnn Hai, PA-C  potassium chloride (K-DUR) 10 MEQ tablet TAKE 1 TABLET (10 MEQ TOTAL) BY MOUTH DAILY. 09/28/15   Minna Merritts, MD  QUEtiapine (SEROQUEL XR) 300 MG 24 hr tablet Take 300 mg by mouth at bedtime.    Historical Provider, MD  Respiratory Therapy Supplies (FLUTTER) DEVI Use 10-15 times daily DX: Mild Bronchiectasis DX Code:J47.1 01/10/17   Wilhelmina Mcardle, MD  SUMAtriptan (IMITREX) 25 MG tablet Take 25 mg by mouth every 2 (two) hours as needed for migraine. May repeat in 2 hours if headache persists or recurs.    Historical Provider, MD  tiZANidine (ZANAFLEX) 4 MG tablet Take 4 mg by mouth every 8 (eight) hours as needed for muscle spasms.    Historical Provider, MD  topiramate (TOPAMAX) 25 MG tablet Take 25 mg by mouth at bedtime.  02/27/15   Historical Provider, MD  traMADol (ULTRAM) 50 MG tablet Take 50 mg by mouth at bedtime.     Historical Provider, MD    [place]; Shellfish allergy; Sulfa antibiotics; and Bee venom  Family History  Problem Relation Age of Onset  . COPD Mother   . Heart Problems Mother   . High Cholesterol Mother   . Hypertension Father   . AAA (abdominal aortic aneurysm) Sister   . COPD Sister   . High Cholesterol Sister     Social History Social History  Substance Use Topics  . Smoking status: Never Smoker  . Smokeless tobacco: Never Used  . Alcohol use No    Review of Systems  Constitutional: Subjective fever/no  chills Eyes: No visual changes. ENT: No sore throat. Cardiovascular: Denies chest pain. Respiratory: Denies shortness of breath. Gastrointestinal: No abdominal pain.  Positive nausea, no vomiting.   Genitourinary: Negative for dysuria. Musculoskeletal: Negative for back pain. Skin: Positive for rash. Neurological: Negative for headaches, focal weakness or numbness.   ____________________________________________   PHYSICAL EXAM:  VITAL SIGNS: ED Triage Vitals  Enc Vitals Group     BP 03/05/17 1348 (!) 119/98     Pulse Rate 03/05/17 1348 95     Resp 03/05/17 1348 20     Temp 03/05/17 1348 97.8 F (36.6 C)     Temp Source 03/05/17 1348  Oral     SpO2 03/05/17 1348 97 %     Weight 03/05/17 1344 161 lb (73 kg)     Height 03/05/17 1344 5\' 2"  (1.575 m)     Head Circumference --      Peak Flow --      Pain Score 03/05/17 1343 5     Pain Loc --      Pain Edu? --      Excl. in [place]? --     Constitutional: Alert and oriented. Well appearing and in no acute distress. Eyes: Conjunctivae are normal. PERRL. EOMI. Head: Atraumatic. Nose: No congestion/rhinnorhea. Neck: No stridor.   Cardiovascular: Normal rate, regular rhythm. Grossly normal heart sounds.  Good peripheral circulation. Respiratory: Normal respiratory effort.  No retractions. Lungs CTAB. Gastrointestinal: Soft and nontender. No distention. Musculoskeletal: Moves upper and lower extremities without any difficulty. Normal gait was noted. Neurologic:  Normal speech and language. No gross focal neurologic deficits are appreciated. No gait instability. Skin:  Skin is warm, dry and intact. On  exam there is a non-fascicular erythematous rash that is scattered from mid back linear in distribution to the abdomen. This area is extremely tender to light touch. No other lesions were noted and the site in which she was bitten by a tick has no erythema or raised area present. Psychiatric: Mood and affect are normal. Speech and behavior  are normal.  ____________________________________________   LABS (all labs ordered are listed, but only abnormal results are displayed)  Labs Reviewed - No data to display   PROCEDURES  Procedure(s) performed: None  Procedures  Critical Care performed: No  ____________________________________________   INITIAL IMPRESSION / ASSESSMENT AND PLAN / ED COURSE  Pertinent labs & imaging results that were available during my care of the patient were reviewed by me and considered in my medical decision making (see chart for details).  With patient's symptoms of extreme pain in this one area and it's linear pattern is felt that this is shingles. The areas are atypical in appearance however her history still suggestive of shingles. Patient was placed on Famvir 500 mg 3 times a day for 7 days along with Norco as needed for pain. She is to follow-up with her PCP if any continued problems.      ____________________________________________   FINAL CLINICAL IMPRESSION(S) / ED DIAGNOSES  Final diagnoses:  Herpes zoster without complication      NEW MEDICATIONS STARTED DURING THIS VISIT:  Discharge Medication List as of 03/05/2017  2:43 PM    START taking these medications   Details  famciclovir (FAMVIR) 500 MG tablet Take 1 tablet (500 mg total) by mouth 3 (three) times daily., Starting Wed 03/05/2017, Until Wed 03/12/2017, Print    HYDROcodone-acetaminophen (NORCO/VICODIN) 5-325 MG tablet Take 1 tablet by mouth every 4 (four) hours as needed for moderate pain., Starting Wed 03/05/2017, Print    ondansetron (ZOFRAN ODT) 4 MG disintegrating tablet Take 1 tablet (4 mg total) by mouth every 8 (eight) hours as needed for nausea or vomiting., Starting Wed 03/05/2017, Print         Note:  This document was prepared using Dragon voice recognition software and may include unintentional dictation errors.    Johnn Hai, PA-C 03/05/17 [place], MD 03/10/17  [phone]

## 2017-03-05 NOTE — ED Notes (Signed)
Red rash, painful to touch, extends from right flank to back.

## 2017-03-05 NOTE — Discharge Instructions (Signed)
Norco as needed for pain. Famvir as an antiviral for shingles. Take as directed. Follow-up with your primary care doctor if any continued problems. Avoid small children that have not been immunized and pregnant women.

## 2017-03-13 DIAGNOSIS — B029 Zoster without complications: Secondary | ICD-10-CM | POA: Diagnosis not present

## 2017-03-24 ENCOUNTER — Ambulatory Visit: Payer: PPO | Admitting: Pulmonary Disease

## 2017-04-17 ENCOUNTER — Emergency Department: Payer: PPO

## 2017-04-17 ENCOUNTER — Emergency Department
Admission: EM | Admit: 2017-04-17 | Discharge: 2017-04-17 | Disposition: A | Discharge status: Home / Self Care | Payer: PPO | Attending: Emergency Medicine | Admitting: Emergency Medicine

## 2017-04-17 DIAGNOSIS — J449 Chronic obstructive pulmonary disease, unspecified: Secondary | ICD-10-CM | POA: Diagnosis not present

## 2017-04-17 DIAGNOSIS — Y939 Activity, unspecified: Secondary | ICD-10-CM | POA: Insufficient documentation

## 2017-04-17 DIAGNOSIS — M2392 Unspecified internal derangement of left knee: Secondary | ICD-10-CM | POA: Diagnosis not present

## 2017-04-17 DIAGNOSIS — I10 Essential (primary) hypertension: Secondary | ICD-10-CM | POA: Insufficient documentation

## 2017-04-17 DIAGNOSIS — W010XXA Fall on same level from slipping, tripping and stumbling without subsequent striking against object, initial encounter: Secondary | ICD-10-CM | POA: Diagnosis not present

## 2017-04-17 DIAGNOSIS — Y998 Other external cause status: Secondary | ICD-10-CM | POA: Diagnosis not present

## 2017-04-17 DIAGNOSIS — M1732 Unilateral post-traumatic osteoarthritis, left knee: Secondary | ICD-10-CM | POA: Diagnosis not present

## 2017-04-17 DIAGNOSIS — Y929 Unspecified place or not applicable: Secondary | ICD-10-CM | POA: Insufficient documentation

## 2017-04-17 DIAGNOSIS — M25562 Pain in left knee: Secondary | ICD-10-CM | POA: Diagnosis not present

## 2017-04-17 DIAGNOSIS — S8992XA Unspecified injury of left lower leg, initial encounter: Secondary | ICD-10-CM | POA: Diagnosis not present

## 2017-04-17 HISTORY — DX: Bronchitis, not specified as acute or chronic: J40

## 2017-04-17 MED ORDER — IBUPROFEN 800 MG PO TABS
800.0000 mg | ORAL_TABLET | Freq: Once | ORAL | Status: AC
  Administered 2017-04-17: 800 mg via ORAL
  Filled 2017-04-17: qty 1

## 2017-04-17 MED ORDER — HYDROCODONE-ACETAMINOPHEN 5-325 MG PO TABS: 1.0000 | ORAL_TABLET | Freq: Four times a day (QID) | ORAL | 0 refills | Status: DC | PRN

## 2017-04-17 MED ORDER — HYDROCODONE-ACETAMINOPHEN 5-325 MG PO TABS
1.0000 | ORAL_TABLET | ORAL | Status: AC
  Administered 2017-04-17: 1 via ORAL
  Filled 2017-04-17: qty 1

## 2017-04-17 MED ORDER — IBUPROFEN 600 MG PO TABS: 600.0000 mg | ORAL_TABLET | Freq: Four times a day (QID) | ORAL | 0 refills | Status: DC | PRN

## 2017-04-17 NOTE — Discharge Instructions (Signed)
Please wear knee immobilizer when up and ambulating. You may come out of the brace as needed for comfort. Use crutches as needed for ambulation. Follow-up with orthopedics. Ibuprofen and Norco as needed for pain.

## 2017-04-17 NOTE — ED Provider Notes (Signed)
[place] Provider Note   CSN: [id number] Arrival date & time: 04/17/17  1935     History   Chief Complaint Chief Complaint  Patient presents with  . Fall  . Knee Pain    HPI Melinda Shaw is a 51 y.o. female presents to the emergency department for evaluation of left knee pain. Patient has surgery in 2009, she describes a knee arthroscopy for meniscectomy, chondroplasty and ACL reconstruction. Patient was doing well up until 2 days ago when she fell after being tripped by her dog. She states she twisted her left knee, developed pain and swelling. She feels instability and giving way sensation with ambulation. She denies any hip or ankle pain. Her pain is 8 out of 10. She is not a medications for pain. She has been ambulatory but with a limp. Denies any head injury, neck or back pain.  HPI  Past Medical History:  Diagnosis Date  . Anemia    H/O  . Anxiety   . Bronchitis   . COPD (chronic obstructive pulmonary disease) ([place])   . Depression   . Fibromyalgia   . GERD (gastroesophageal reflux disease)   . Headache   . Hyperlipidemia   . Hypertension    no meds  . PONV (postoperative nausea and vomiting)   . Shortness of breath dyspnea     Patient Active Problem List   Diagnosis Date Noted  . Lung infiltrate   . Pulmonary infiltrate in right lung on chest x-ray   . Pulmonary infiltrate in left lung on chest x-ray   . Reactive airway disease 03/21/2015  . Dyspnea 02/22/2015  . Cough 02/22/2015  . Acute upper respiratory infection 02/22/2015    Past Surgical History:  Procedure Laterality Date  . ANTERIOR CRUCIATE LIGAMENT REPAIR    . CHOLECYSTECTOMY    . ESOPHAGOGASTRODUODENOSCOPY (EGD) WITH PROPOFOL N/A 08/28/2015   Procedure: ESOPHAGOGASTRODUODENOSCOPY (EGD) WITH PROPOFOL;  Surgeon: Josefine Class, MD;  Location: [facility] ENDOSCOPY;  Service: Endoscopy;  Laterality: N/A;  . KNEE SURGERY     left knee   . MASS EXCISION Left 01/26/2016   Procedure: EXCISION OF LEFT WRIST VOLAR MASS;  Surgeon: Charlotte Crumb, MD;  Location: [place];  Service: Orthopedics;  Laterality: Left;  . NOSE SURGERY    . POLYPECTOMY    . TUBAL LIGATION    . VIDEO BRONCHOSCOPY N/A 05/29/2015   Procedure: VIDEO BRONCHOSCOPY WITHOUT FLUORO;  Surgeon: Vilinda Boehringer, MD;  Location: [facility] ORS;  Service: Cardiopulmonary;  Laterality: N/A;    OB History    No data available       Home Medications    Prior to Admission medications   Medication Sig Start Date End Date Taking? Authorizing Provider  albuterol (PROVENTIL HFA;VENTOLIN HFA) 108 (90 BASE) MCG/ACT inhaler Inhale 2 puffs into the lungs every 6 (six) hours as needed for wheezing or shortness of breath.    [provider]  citalopram (CELEXA) 20 MG tablet Take 20 mg by mouth every morning.     [provider]  clonazePAM (KLONOPIN) 1 MG tablet Take 1 mg by mouth 3 (three) times daily.    [provider]  cyclobenzaprine (FLEXERIL) 10 MG tablet Take 1 tablet (10 mg total) by mouth 3 (three) times daily as needed for muscle spasms. 10/10/16   Hagler, Jami L, PA-C  eletriptan (RELPAX) 20 MG tablet Take 20 mg by mouth every 2 (two) hours as needed.     [provider]  EPINEPHrine (  EPIPEN 2-PAK) 0.3 mg/0.3 mL IJ SOAJ injection Inject 0.3 mLs (0.3 mg total) into the muscle once. 05/31/16   Nance Pear, MD  furosemide (LASIX) 20 MG tablet Take 1 tablet (20 mg total) by mouth 2 (two) times daily as needed. 12/25/16   Minna Merritts, MD  gabapentin (NEURONTIN) 100 MG capsule Take 200 mg by mouth at bedtime.     [provider]  HYDROcodone-acetaminophen (NORCO) 5-325 MG tablet Take 1 tablet by mouth every 6 (six) hours as needed for moderate pain. 04/17/17   Duanne Guess, PA-C  ibuprofen (ADVIL,MOTRIN) 600 MG tablet Take 1 tablet (600 mg total) by mouth every 6 (six) hours as needed for moderate pain. 04/17/17   Duanne Guess, PA-C    mometasone-formoterol (DULERA) 200-5 MCG/ACT AERO Inhale 2 puffs into the lungs 2 (two) times daily. Patient taking differently: Inhale 2 puffs into the lungs daily.  03/28/16   Mungal, Roxanne Mins, MD  omeprazole (PRILOSEC) 20 MG capsule Take 20 mg by mouth daily.    [provider]  ondansetron (ZOFRAN ODT) 4 MG disintegrating tablet Take 1 tablet (4 mg total) by mouth every 8 (eight) hours as needed for nausea or vomiting. 03/05/17   Letitia Neri L, PA-C  potassium chloride (K-DUR) 10 MEQ tablet TAKE 1 TABLET (10 MEQ TOTAL) BY MOUTH DAILY. 09/28/15   Minna Merritts, MD  QUEtiapine (SEROQUEL XR) 300 MG 24 hr tablet Take 300 mg by mouth at bedtime.    [provider]  Respiratory Therapy Supplies (FLUTTER) DEVI Use 10-15 times daily DX: Mild Bronchiectasis DX Code:J47.1 01/10/17   Wilhelmina Mcardle, MD  SUMAtriptan (IMITREX) 25 MG tablet Take 25 mg by mouth every 2 (two) hours as needed for migraine. May repeat in 2 hours if headache persists or recurs.    [provider]  tiZANidine (ZANAFLEX) 4 MG tablet Take 4 mg by mouth every 8 (eight) hours as needed for muscle spasms.    [provider]  topiramate (TOPAMAX) 25 MG tablet Take 25 mg by mouth at bedtime.  02/27/15   [provider]  traMADol (ULTRAM) 50 MG tablet Take 50 mg by mouth at bedtime.     [provider]    Family History Family History  Problem Relation Age of Onset  . COPD Mother   . Heart Problems Mother   . High Cholesterol Mother   . Hypertension Father   . AAA (abdominal aortic aneurysm) Sister   . COPD Sister   . High Cholesterol Sister     Social History Social History  Substance Use Topics  . Smoking status: Never Smoker  . Smokeless tobacco: Never Used  . Alcohol use No     Allergies   [place]; Shellfish allergy; Sulfa antibiotics; and Bee venom   Review of Systems Review of Systems  Constitutional: Negative for fever.  Respiratory: Negative for  shortness of breath.   Cardiovascular: Negative for chest pain.  Musculoskeletal: Positive for arthralgias, gait problem and joint swelling. Negative for back pain and neck pain.  Skin: Negative for rash and wound.  Neurological: Negative for weakness, numbness and headaches.  Hematological: Negative for adenopathy.  Psychiatric/Behavioral: Negative for agitation, behavioral problems and confusion.     Physical Exam Updated Vital Signs BP (!) 142/69 (BP Location: Right Arm)   Pulse 72   Temp 98.3 F (36.8 C) (Oral)   Resp 18   Ht 5\' 2"  (1.575 m)   Wt 72.6 kg (160 lb)  LMP 09/24/2016 (Approximate)   SpO2 100%   BMI 29.26 kg/m   Physical Exam  Constitutional: She is oriented to person, place, and time. She appears well-developed and well-nourished.  HENT:  Head: Normocephalic and atraumatic.  Eyes: Conjunctivae are normal. Right eye exhibits no discharge. Left eye exhibits no discharge.  Neck: Normal range of motion.  Cardiovascular: Normal rate and intact distal pulses.   Pulmonary/Chest: Effort normal and breath sounds normal.  Musculoskeletal:  Examination of the left lower extremity shows patient has no pain with logrolling of the left hip. She has no pain with left hip internal rotation. Examination of the left knee shows mild swelling with no significant effusion, warmth erythema. No skin breakdown noted. There is no edema throughout the left lower extremity. Patient is able to straight leg raise. There is no palpable defect in the quads tendon, patellar tendon. Knee is stable to valgus stress testing. She has range of motion 0-95, chest pain with hyperflexion. She is unable tolerate McMurray's test. Patient has a painful anterior drawer test with slight increase in laxity on the left when compared to the right. No severe instability with stressing the anterior cruciate ligament.  Neurological: She is alert and oriented to person, place, and time.  Skin: Skin is warm and dry.   Psychiatric: She has a normal mood and affect. Her behavior is normal. Judgment and thought content normal.     ED Treatments / Results  Labs (all labs ordered are listed, but only abnormal results are displayed) Labs Reviewed - No data to display  EKG  EKG Interpretation None       Radiology Dg Knee Complete 4 Views Left  Result Date: 04/17/2017 CLINICAL DATA:  Left knee pain after a fall 1 week ago. EXAM: LEFT KNEE - COMPLETE 4+ VIEW COMPARISON:  06/19/2013 FINDINGS: Four views study shows no evidence for an acute fracture. No subluxation or dislocation. Sequelae of ACL repair again noted. Loss of joint space evident medial compartment. Hypertrophic spurring is visible in all 3 compartments. Lateral film is centered well which precludes assessment for joint effusion IMPRESSION: Status post ACL repair.  No acute bony abnormality. Electronically Signed   By: Misty Stanley M.D.   On: 04/17/2017 20:38    Procedures Procedures (including critical care time) SPLINT APPLICATION Date/Time: 5:62 PM Authorized by: Feliberto Gottron Consent: Verbal consent obtained. Risks and benefits: risks, benefits and alternatives were discussed Consent given by: patient Splint applied by:  ED tech Location details:  Left lower extremity Splint type:  Knee immobilizer Supplies used:  Knee immobilizer and crutches. Post-procedure: The splinted body part was neurovascularly unchanged following the procedure. Patient tolerance: Patient tolerated the procedure well with no immediate complications.     Medications Ordered in ED Medications  HYDROcodone-acetaminophen (NORCO/VICODIN) 5-325 MG per tablet 1 tablet (1 tablet Oral Given 04/17/17 2020)  ibuprofen (ADVIL,MOTRIN) tablet 800 mg (800 mg Oral Given 04/17/17 2020)     Initial Impression / Assessment and Plan / ED Course  I have reviewed the triage vital signs and the nursing notes.  Pertinent labs & imaging results that were  available during my care of the patient were reviewed by me and considered in my medical decision making (see chart for details).      51 year old female with history of left knee arthroscopy with anterior cruciate ligament reconstruction presents to the emergency department for left knee pain and swelling. Patient notices instability with her left knee. X-rays are negative for any acute  bony abnormality but she does have significant tricompartmental osteoarthritis with mild laxity of the left knee with stressing of anterior cruciate ligament. She is placed into a knee immobilizer today, will rest ice and elevate knee. She'll follow-up with orthopedics.  Final Clinical Impressions(s) / ED Diagnoses   Final diagnoses:  Post-traumatic osteoarthritis of left knee  Internal derangement of left knee    New Prescriptions New Prescriptions   HYDROCODONE-ACETAMINOPHEN (NORCO) 5-325 MG TABLET    Take 1 tablet by mouth every 6 (six) hours as needed for moderate pain.   IBUPROFEN (ADVIL,MOTRIN) 600 MG TABLET    Take 1 tablet (600 mg total) by mouth every 6 (six) hours as needed for moderate pain.     Renata Caprice 04/17/17 2110    Darel Hong, MD 04/17/17 2119

## 2017-04-17 NOTE — ED Notes (Signed)
Pt fall with right knee pain, pt ambulatory upon arrival

## 2017-04-17 NOTE — ED Triage Notes (Signed)
Pt reports fall a week ago and states left knee pain. Pt reports in 2009 having left knee surgery and states she has 1 screw in that knee. Pt states when she ambulates she can hear a pop. Pt A&O at this time and in NAD.

## 2017-04-24 DIAGNOSIS — M25562 Pain in left knee: Secondary | ICD-10-CM | POA: Diagnosis not present

## 2017-04-25 ENCOUNTER — Other Ambulatory Visit: Payer: Self-pay | Admitting: Student

## 2017-04-25 DIAGNOSIS — M25562 Pain in left knee: Secondary | ICD-10-CM

## 2017-05-01 ENCOUNTER — Ambulatory Visit: Payer: PPO

## 2017-05-02 ENCOUNTER — Ambulatory Visit
Admission: RE | Admit: 2017-05-02 | Discharge: 2017-05-02 | Disposition: A | Discharge status: Home / Self Care | Payer: PPO | Source: Ambulatory Visit | Attending: Student | Admitting: Student

## 2017-05-02 DIAGNOSIS — M25562 Pain in left knee: Secondary | ICD-10-CM | POA: Insufficient documentation

## 2017-05-02 DIAGNOSIS — M94262 Chondromalacia, left knee: Secondary | ICD-10-CM | POA: Diagnosis not present

## 2017-05-05 ENCOUNTER — Ambulatory Visit: Payer: PPO

## 2017-05-14 IMAGING — MR MR WRIST*L* W/O CM
5 of 6 series · 32 of 40 positions shown · non-contrast
Comparison: Plain films of the left wrist 07/30/2013 and
12/25/2015.

CLINICAL DATA: Status post fall in November 2015 with a right wrist
injury. Continued pain at the base of the right thumb with a lump in
that location. Subsequent encounter.

EXAM:
MR OF THE LEFT WRIST WITHOUT CONTRAST
TECHNIQUE: Multiplanar, multisequence MR imaging of the left wrist was
performed. No intravenous contrast was administered.

[Series 3: T2 fat-sat · axial · 3.0mm · 0.20mm/px · z∈[-48,+28]mm · 9 of 24 slices shown (1 of 2)]
[im 1/24]
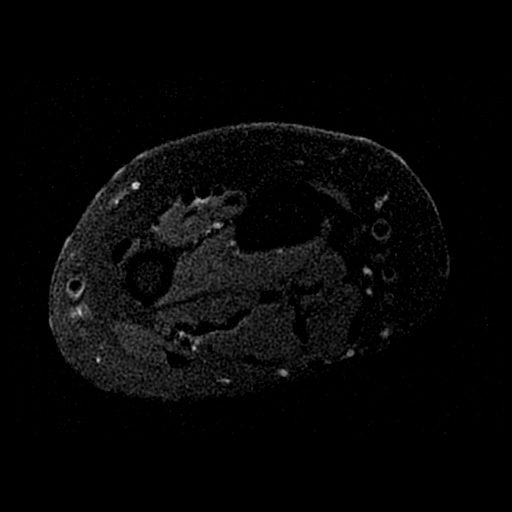
[im 3/24]
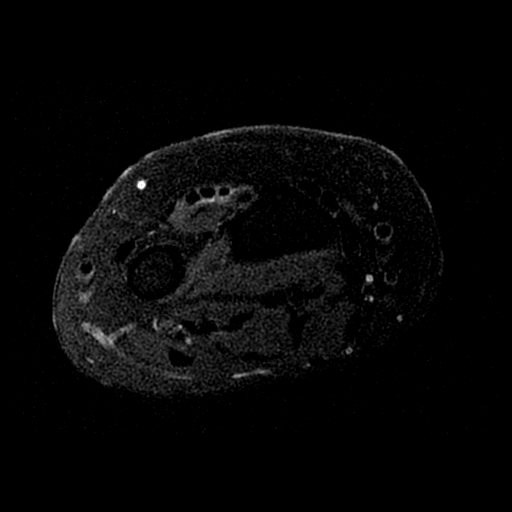
[im 6/24]
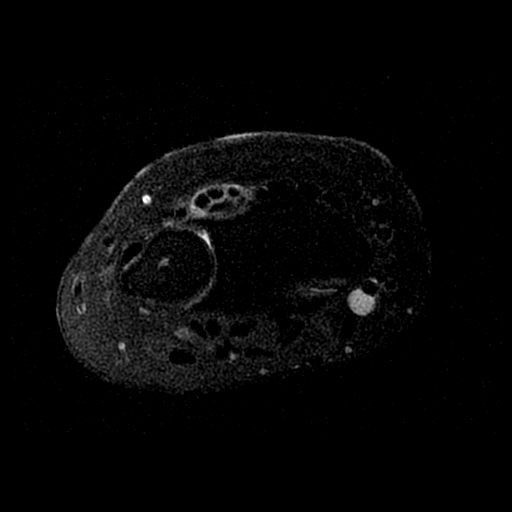
[im 9/24]
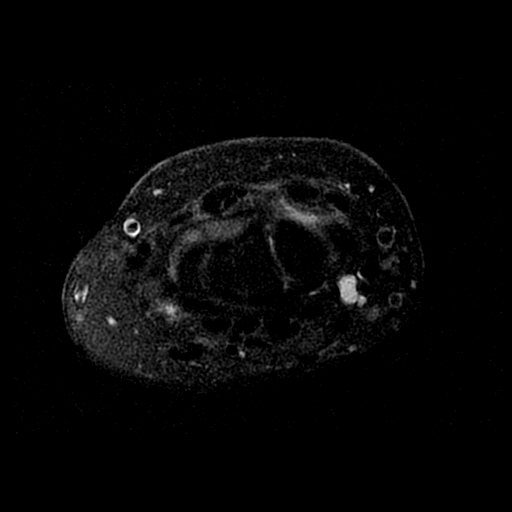
[im 12/24]
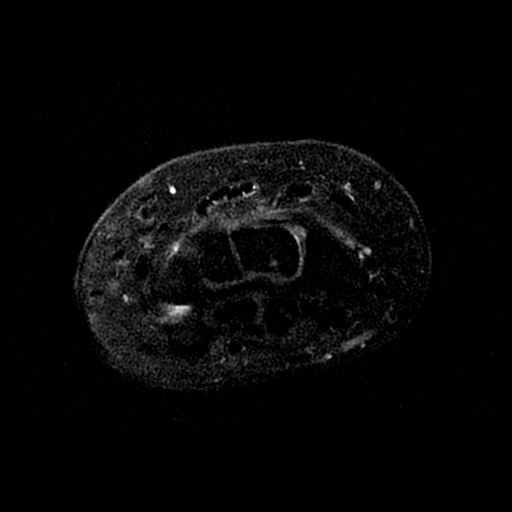
[im 15/24]
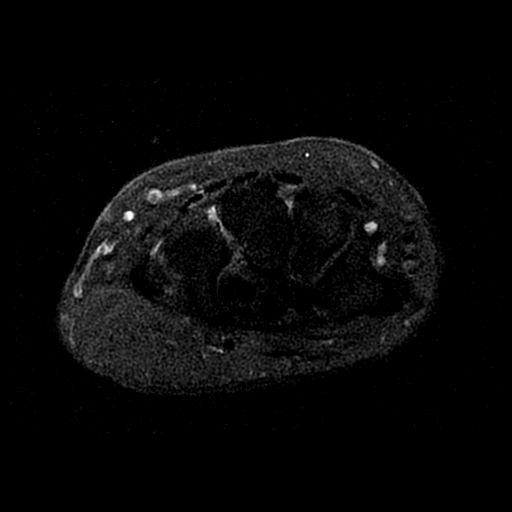
[im 18/24]
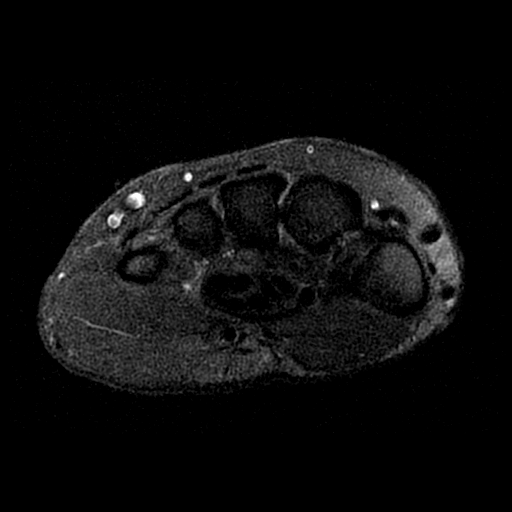
[im 21/24]
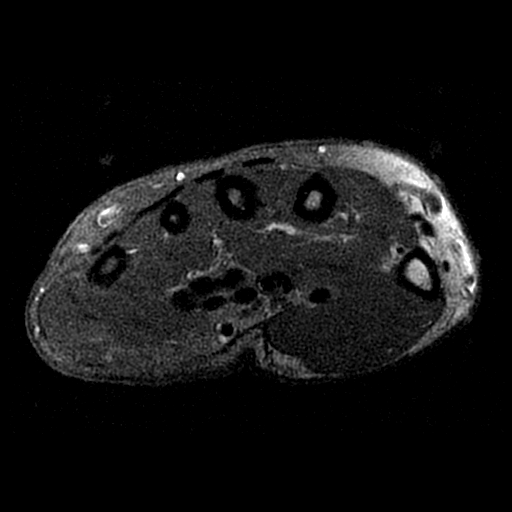
[im 24/24]
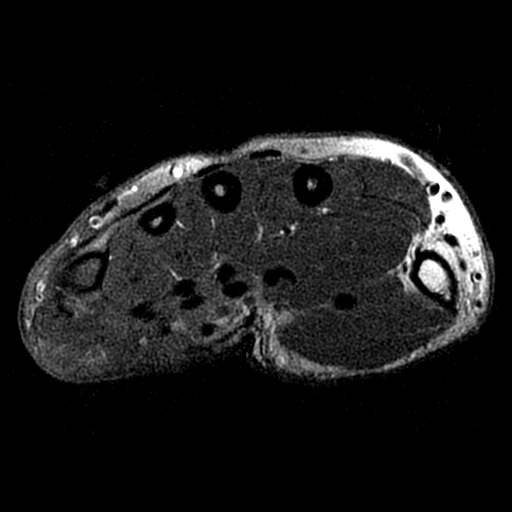

[Series 4: T1 · coronal · 3.0mm · 0.20mm/px · 4 of 16 slices shown]
[im 1/16]
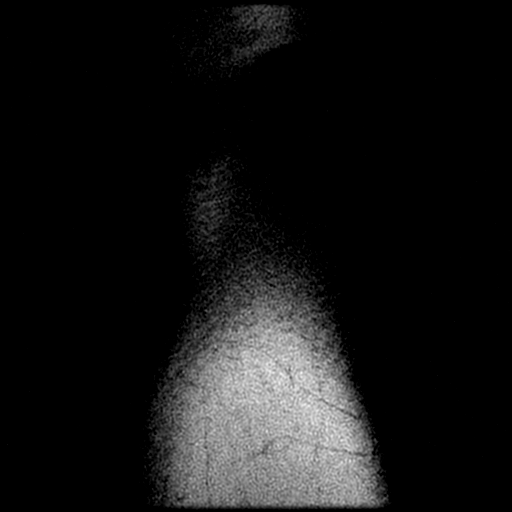
[im 4/16]
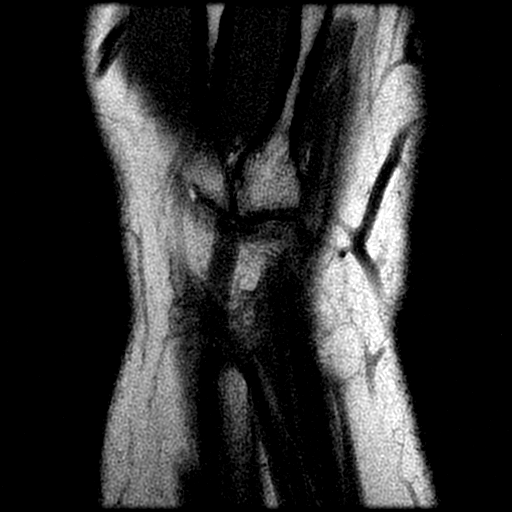
[im 7/16]
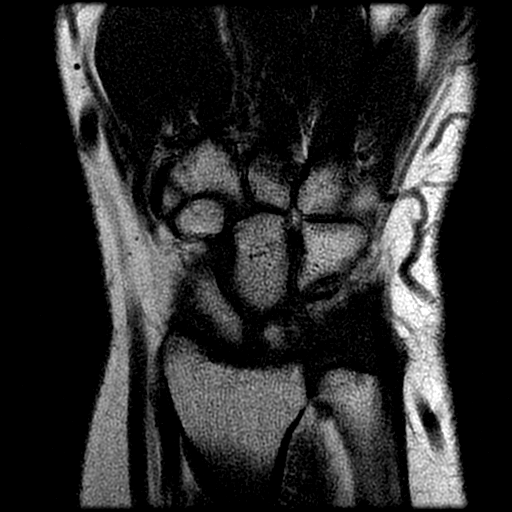
[im 10/16]
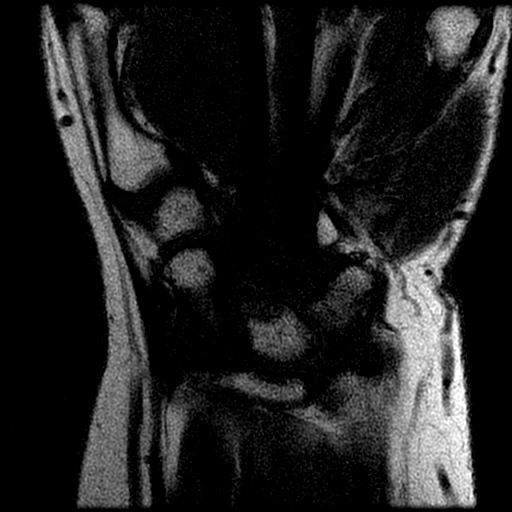

[Series 6: PD fat-sat · coronal · 3.0mm · 0.39mm/px · 6 of 16 slices shown (1 of 2)]
[im 1/16]
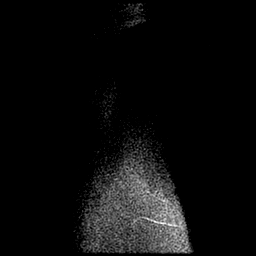
[im 4/16]
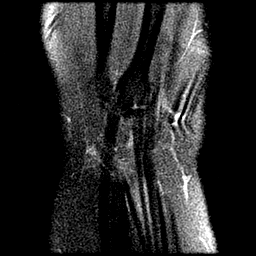
[im 7/16]
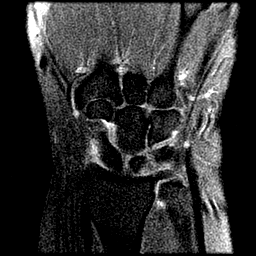
[im 10/16]
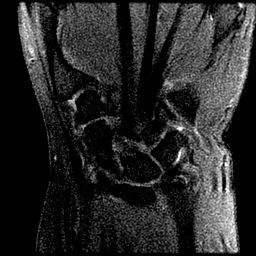
[im 13/16]
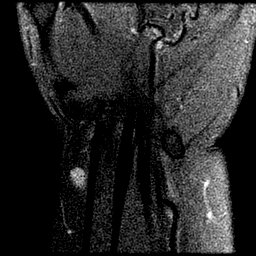
[im 16/16]
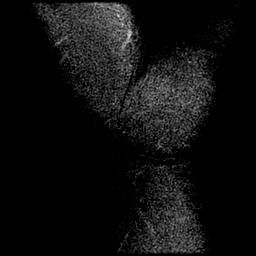

[Series 8: PD fat-sat · sagittal · 3.0mm · 0.39mm/px · 7 of 20 slices shown (2 of 2)]
[im 1/20]
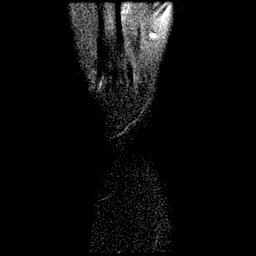
[im 4/20]
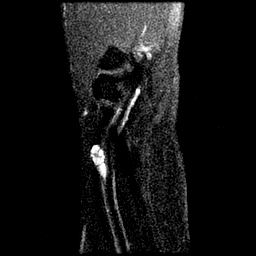
[im 7/20]
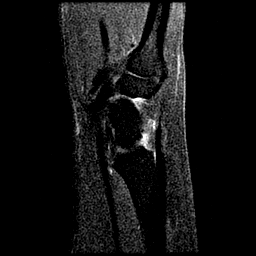
[im 10/20]
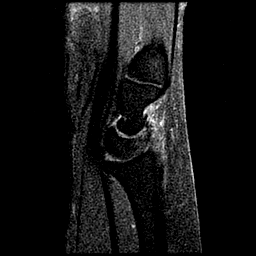
[im 13/20]
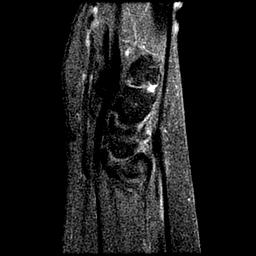
[im 16/20]
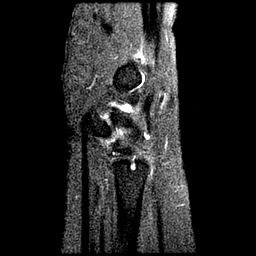
[im 20/20]
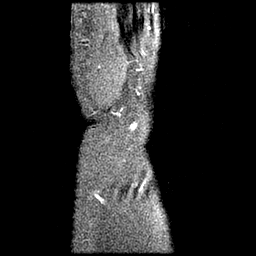

[Series 9: T2 fat-sat · coronal · 3.0mm · 0.39mm/px · 6 of 16 slices shown (2 of 2)]
[im 1/16]
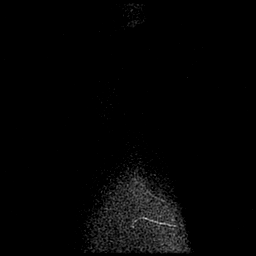
[im 4/16]
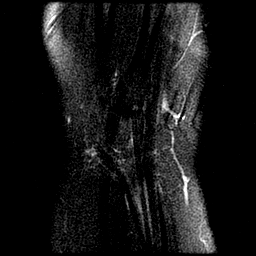
[im 7/16]
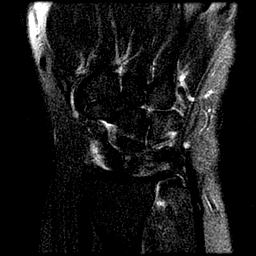
[im 10/16]
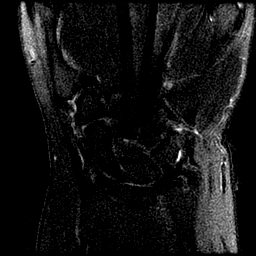
[im 13/16]
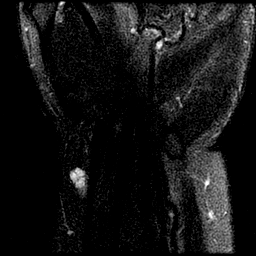
[im 16/16]
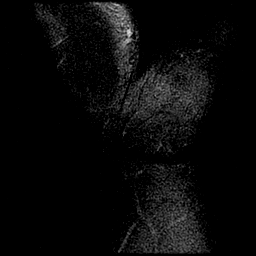

[32 of 40 positions shown; findings below may reference images not displayed]

FINDINGS: There is a multi-septated cystic lesion in the volar soft tissues of
the wrist at the level of the radial styloid which measures 2.0 cm
craniocaudal by 0.8 cm transverse by 0.4 cm AP most consistent with
a ganglion. The cyst is interposed between the radial artery and
vein and the flexor carpi the radialis tendon.

Ligaments: Intact.

Triangular fibrocartilage: Intact.

Tendons: Intact.

Carpal tunnel/median nerve: Unremarkable.

Guyon's canal: Unremarkable.

Joint/cartilage: Unremarkable.

Bones/carpal alignment: Appear normal.

Other: None.
IMPRESSION: Ganglion cyst in the volar soft tissues interposed between the
radial artery and vein and the flexor carpi radialis tendon as
described above. The examination is otherwise negative.

## 2017-05-15 ENCOUNTER — Other Ambulatory Visit: Payer: Self-pay | Admitting: Cardiovascular Disease

## 2017-05-15 DIAGNOSIS — W541XXA Struck by dog, initial encounter: Secondary | ICD-10-CM | POA: Diagnosis not present

## 2017-05-15 DIAGNOSIS — M25562 Pain in left knee: Secondary | ICD-10-CM | POA: Diagnosis not present

## 2017-05-15 DIAGNOSIS — S83512D Sprain of anterior cruciate ligament of left knee, subsequent encounter: Secondary | ICD-10-CM | POA: Diagnosis not present

## 2017-05-30 DIAGNOSIS — M1732 Unilateral post-traumatic osteoarthritis, left knee: Secondary | ICD-10-CM | POA: Insufficient documentation

## 2017-05-30 DIAGNOSIS — T8489XA Other specified complication of internal orthopedic prosthetic devices, implants and grafts, initial encounter: Secondary | ICD-10-CM | POA: Insufficient documentation

## 2017-06-01 ENCOUNTER — Emergency Department: Payer: PPO

## 2017-06-01 ENCOUNTER — Emergency Department
Admission: EM | Admit: 2017-06-01 | Discharge: 2017-06-01 | Disposition: A | Discharge status: Home / Self Care | Payer: PPO | Attending: Emergency Medicine | Admitting: Emergency Medicine

## 2017-06-01 DIAGNOSIS — S8002XA Contusion of left knee, initial encounter: Secondary | ICD-10-CM | POA: Insufficient documentation

## 2017-06-01 DIAGNOSIS — W228XXA Striking against or struck by other objects, initial encounter: Secondary | ICD-10-CM | POA: Insufficient documentation

## 2017-06-01 DIAGNOSIS — J449 Chronic obstructive pulmonary disease, unspecified: Secondary | ICD-10-CM | POA: Diagnosis not present

## 2017-06-01 DIAGNOSIS — T148XXA Other injury of unspecified body region, initial encounter: Secondary | ICD-10-CM

## 2017-06-01 DIAGNOSIS — Y9389 Activity, other specified: Secondary | ICD-10-CM | POA: Insufficient documentation

## 2017-06-01 DIAGNOSIS — S20419A Abrasion of unspecified back wall of thorax, initial encounter: Secondary | ICD-10-CM | POA: Diagnosis not present

## 2017-06-01 DIAGNOSIS — I10 Essential (primary) hypertension: Secondary | ICD-10-CM | POA: Diagnosis not present

## 2017-06-01 DIAGNOSIS — Y9289 Other specified places as the place of occurrence of the external cause: Secondary | ICD-10-CM | POA: Insufficient documentation

## 2017-06-01 DIAGNOSIS — S8992XD Unspecified injury of left lower leg, subsequent encounter: Secondary | ICD-10-CM | POA: Diagnosis present

## 2017-06-01 DIAGNOSIS — Z79899 Other long term (current) drug therapy: Secondary | ICD-10-CM | POA: Insufficient documentation

## 2017-06-01 DIAGNOSIS — M179 Osteoarthritis of knee, unspecified: Secondary | ICD-10-CM | POA: Diagnosis not present

## 2017-06-01 DIAGNOSIS — W19XXXA Unspecified fall, initial encounter: Secondary | ICD-10-CM

## 2017-06-01 DIAGNOSIS — Y999 Unspecified external cause status: Secondary | ICD-10-CM | POA: Diagnosis not present

## 2017-06-01 DIAGNOSIS — S300XXA Contusion of lower back and pelvis, initial encounter: Secondary | ICD-10-CM | POA: Diagnosis not present

## 2017-06-01 DIAGNOSIS — M79605 Pain in left leg: Secondary | ICD-10-CM | POA: Diagnosis not present

## 2017-06-01 DIAGNOSIS — S3992XA Unspecified injury of lower back, initial encounter: Secondary | ICD-10-CM | POA: Diagnosis not present

## 2017-06-01 MED ORDER — ONDANSETRON 4 MG PO TBDP
ORAL_TABLET | ORAL | Status: AC
  Filled 2017-06-01: qty 1

## 2017-06-01 MED ORDER — IBUPROFEN 600 MG PO TABS: 600.0000 mg | ORAL_TABLET | Freq: Four times a day (QID) | ORAL | 0 refills | Status: DC | PRN

## 2017-06-01 MED ORDER — ONDANSETRON 4 MG PO TBDP
4.0000 mg | ORAL_TABLET | Freq: Once | ORAL | Status: AC
Start: 2017-06-01 — End: 2017-06-01
  Administered 2017-06-01: 4 mg via ORAL

## 2017-06-01 MED ORDER — OXYCODONE-ACETAMINOPHEN 5-325 MG PO TABS
1.0000 | ORAL_TABLET | Freq: Once | ORAL | Status: AC
  Administered 2017-06-01: 1 via ORAL
  Filled 2017-06-01: qty 1
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
## 2017-09-01 DIAGNOSIS — R109 Unspecified abdominal pain: Secondary | ICD-10-CM | POA: Diagnosis not present

## 2017-09-01 DIAGNOSIS — R740 Nonspecific elevation of levels of transaminase and lactic acid dehydrogenase [LDH]: Secondary | ICD-10-CM | POA: Diagnosis not present

## 2017-09-01 DIAGNOSIS — G43709 Chronic migraine without aura, not intractable, without status migrainosus: Secondary | ICD-10-CM | POA: Diagnosis not present

## 2017-09-01 DIAGNOSIS — Z1389 Encounter for screening for other disorder: Secondary | ICD-10-CM | POA: Diagnosis not present

## 2017-09-01 DIAGNOSIS — Z1211 Encounter for screening for malignant neoplasm of colon: Secondary | ICD-10-CM | POA: Diagnosis not present

## 2017-09-25 DIAGNOSIS — J069 Acute upper respiratory infection, unspecified: Secondary | ICD-10-CM | POA: Diagnosis not present

## 2018-05-25 DIAGNOSIS — E559 Vitamin D deficiency, unspecified: Secondary | ICD-10-CM | POA: Diagnosis not present

## 2018-05-25 DIAGNOSIS — R740 Nonspecific elevation of levels of transaminase and lactic acid dehydrogenase [LDH]: Secondary | ICD-10-CM | POA: Diagnosis not present

## 2018-05-25 DIAGNOSIS — R7309 Other abnormal glucose: Secondary | ICD-10-CM | POA: Diagnosis not present

## 2018-05-25 DIAGNOSIS — Z1211 Encounter for screening for malignant neoplasm of colon: Secondary | ICD-10-CM | POA: Diagnosis not present

## 2018-05-25 DIAGNOSIS — F329 Major depressive disorder, single episode, unspecified: Secondary | ICD-10-CM | POA: Diagnosis not present

## 2018-05-25 DIAGNOSIS — J449 Chronic obstructive pulmonary disease, unspecified: Secondary | ICD-10-CM | POA: Diagnosis not present

## 2018-05-25 DIAGNOSIS — G43709 Chronic migraine without aura, not intractable, without status migrainosus: Secondary | ICD-10-CM | POA: Diagnosis not present

## 2018-05-25 DIAGNOSIS — Z1239 Encounter for other screening for malignant neoplasm of breast: Secondary | ICD-10-CM | POA: Diagnosis not present

## 2018-05-25 DIAGNOSIS — M797 Fibromyalgia: Secondary | ICD-10-CM | POA: Diagnosis not present

## 2018-05-25 DIAGNOSIS — Z23 Encounter for immunization: Secondary | ICD-10-CM | POA: Diagnosis not present

## 2018-05-25 DIAGNOSIS — R635 Abnormal weight gain: Secondary | ICD-10-CM | POA: Diagnosis not present

## 2018-05-26 ENCOUNTER — Other Ambulatory Visit: Payer: Self-pay

## 2018-05-26 ENCOUNTER — Emergency Department
Admission: EM | Admit: 2018-05-26 | Discharge: 2018-05-26 | Disposition: A | Discharge status: Home / Self Care | Payer: PPO | Attending: Emergency Medicine | Admitting: Emergency Medicine

## 2018-05-26 ENCOUNTER — Emergency Department: Payer: PPO

## 2018-05-26 DIAGNOSIS — Y929 Unspecified place or not applicable: Secondary | ICD-10-CM | POA: Diagnosis not present

## 2018-05-26 DIAGNOSIS — Y999 Unspecified external cause status: Secondary | ICD-10-CM | POA: Diagnosis not present

## 2018-05-26 DIAGNOSIS — I10 Essential (primary) hypertension: Secondary | ICD-10-CM | POA: Diagnosis not present

## 2018-05-26 DIAGNOSIS — Z79899 Other long term (current) drug therapy: Secondary | ICD-10-CM | POA: Insufficient documentation

## 2018-05-26 DIAGNOSIS — S70211A Abrasion, right hip, initial encounter: Secondary | ICD-10-CM | POA: Diagnosis not present

## 2018-05-26 DIAGNOSIS — S52615A Nondisplaced fracture of left ulna styloid process, initial encounter for closed fracture: Secondary | ICD-10-CM | POA: Insufficient documentation

## 2018-05-26 DIAGNOSIS — W1789XA Other fall from one level to another, initial encounter: Secondary | ICD-10-CM | POA: Diagnosis not present

## 2018-05-26 DIAGNOSIS — Y939 Activity, unspecified: Secondary | ICD-10-CM | POA: Diagnosis not present

## 2018-05-26 DIAGNOSIS — J449 Chronic obstructive pulmonary disease, unspecified: Secondary | ICD-10-CM | POA: Diagnosis not present

## 2018-05-26 DIAGNOSIS — M545 Low back pain: Secondary | ICD-10-CM | POA: Diagnosis not present

## 2018-05-26 DIAGNOSIS — S52512A Displaced fracture of left radial styloid process, initial encounter for closed fracture: Secondary | ICD-10-CM | POA: Diagnosis not present

## 2018-05-26 DIAGNOSIS — M25532 Pain in left wrist: Secondary | ICD-10-CM | POA: Diagnosis present

## 2018-05-26 DIAGNOSIS — S52515A Nondisplaced fracture of left radial styloid process, initial encounter for closed fracture: Secondary | ICD-10-CM

## 2018-05-26 MED ORDER — OXYCODONE-ACETAMINOPHEN 5-325 MG PO TABS: 1.0000 | ORAL_TABLET | Freq: Four times a day (QID) | ORAL | 0 refills | Status: DC | PRN

## 2018-05-26 MED ORDER — OXYCODONE-ACETAMINOPHEN 5-325 MG PO TABS
1.0000 | ORAL_TABLET | Freq: Once | ORAL | Status: AC
  Administered 2018-05-26: 1 via ORAL
  Filled 2018-05-26: qty 1

## 2018-05-26 MED ORDER — IBUPROFEN 600 MG PO TABS: 600.0000 mg | ORAL_TABLET | Freq: Three times a day (TID) | ORAL | 0 refills | Status: DC | PRN

## 2018-05-26 NOTE — ED Triage Notes (Signed)
Pt states she slipped and fell off the porch landing on some flower pots, pt is ambulatory with a steady gait to triage. Pt c/o left FA, left lower back pain. Denies LOC.[name]

## 2018-05-26 NOTE — ED Notes (Signed)
Swelling to the left arm and wrist. Area currently being iced. Pt states normal sensations in the hand. If attempt to move wrist pt states shooting pain up into elbow.

## 2018-05-26 NOTE — Discharge Instructions (Addendum)
Ice and elevation to reduce swelling and help with pain. Call make an appointment with Dr. Posey Pronto who is the orthopedist on call.  He is in the orthopedic department of [facility]. Percocet as needed for moderate to severe pain.  Ibuprofen 600 mg 3 times daily with food as needed for inflammation.  Do not take the splint off.  You may loosen the Ace wrap if it feels too tight. Return to the emergency department if any severe worsening of your symptoms.

## 2018-05-26 NOTE — ED Provider Notes (Signed)
[facility] Emergency Department Provider Note  ____________________________________________   First MD Initiated Contact with Patient 05/26/18 1230     (approximate)  I have reviewed the triage vital signs and the nursing notes.   HISTORY  Chief Complaint Fall  HPI KALEEA PENNER is a 52 y.o. female is here with complaint of left wrist pain and low back pain after falling on the porch this morning.  Patient denies any head injury or loss of consciousness.  She believes that she fell sideways with most of her weight on her left hand.  She denies any previous injury to her left wrist.  Patient was ambulatory after the accident.  She is up-to-date on tetanus.  She rates her pain as 10/10.  Past Medical History:  Diagnosis Date  . Anemia    H/O  . Anxiety   . Bronchitis   . COPD (chronic obstructive pulmonary disease) ([place])   . Depression   . Fibromyalgia   . GERD (gastroesophageal reflux disease)   . Headache   . Hyperlipidemia   . Hypertension    no meds  . PONV (postoperative nausea and vomiting)   . Shortness of breath dyspnea     Patient Active Problem List   Diagnosis Date Noted  . Lung infiltrate   . Pulmonary infiltrate in right lung on chest x-ray   . Pulmonary infiltrate in left lung on chest x-ray   . Reactive airway disease 03/21/2015  . Dyspnea 02/22/2015  . Cough 02/22/2015  . Acute upper respiratory infection 02/22/2015    Past Surgical History:  Procedure Laterality Date  . ANTERIOR CRUCIATE LIGAMENT REPAIR    . CHOLECYSTECTOMY    . ESOPHAGOGASTRODUODENOSCOPY (EGD) WITH PROPOFOL N/A 08/28/2015   Procedure: ESOPHAGOGASTRODUODENOSCOPY (EGD) WITH PROPOFOL;  Surgeon: Josefine Class, MD;  Location: [facility] ENDOSCOPY;  Service: Endoscopy;  Laterality: N/A;  . KNEE SURGERY     left knee   . MASS EXCISION Left 01/26/2016   Procedure: EXCISION OF LEFT WRIST VOLAR MASS;  Surgeon: Charlotte Crumb, MD;  Location: [place];  Service: Orthopedics;  Laterality: Left;  . NOSE SURGERY    . POLYPECTOMY    . TUBAL LIGATION    . VIDEO BRONCHOSCOPY N/A 05/29/2015   Procedure: VIDEO BRONCHOSCOPY WITHOUT FLUORO;  Surgeon: Vilinda Boehringer, MD;  Location: [facility] ORS;  Service: Cardiopulmonary;  Laterality: N/A;    Prior to Admission medications   Medication Sig Start Date End Date Taking? Authorizing Provider  albuterol (PROVENTIL HFA;VENTOLIN HFA) 108 (90 BASE) MCG/ACT inhaler Inhale 2 puffs into the lungs every 6 (six) hours as needed for wheezing or shortness of breath.    [provider]  citalopram (CELEXA) 20 MG tablet Take 20 mg by mouth every morning.     [provider]  clonazePAM (KLONOPIN) 1 MG tablet Take 1 mg by mouth 3 (three) times daily.    [provider]  cyclobenzaprine (FLEXERIL) 10 MG tablet Take 1 tablet (10 mg total) by mouth 3 (three) times daily as needed for muscle spasms. 10/10/16   Hagler, Jami L, PA-C  eletriptan (RELPAX) 20 MG tablet Take 20 mg by mouth every 2 (two) hours as needed.     [provider]  EPINEPHrine (EPIPEN 2-PAK) 0.3 mg/0.3 mL IJ SOAJ injection Inject 0.3 mLs (0.3 mg total) into the muscle once. 05/31/16   Nance Pear, MD  furosemide (LASIX) 20 MG tablet Take 1 tablet (20 mg total) by mouth 2 (two) times  daily as needed. 12/25/16   Minna Merritts, MD  gabapentin (NEURONTIN) 100 MG capsule Take 200 mg by mouth at bedtime.     [provider]  HYDROcodone-acetaminophen (NORCO) 5-325 MG tablet Take 1 tablet by mouth every 6 (six) hours as needed for moderate pain. 04/17/17   Duanne Guess, PA-C  ibuprofen (IBU) 600 MG tablet Take 1 tablet (600 mg total) by mouth every 8 (eight) hours as needed. 05/26/18   Johnn Hai, PA-C  mometasone-formoterol (DULERA) 200-5 MCG/ACT AERO Inhale 2 puffs into the lungs 2 (two) times daily. Patient taking differently: Inhale 2 puffs into the lungs daily.  03/28/16   Mungal, Roxanne Mins,  MD  omeprazole (PRILOSEC) 20 MG capsule Take 20 mg by mouth daily.    [provider]  ondansetron (ZOFRAN ODT) 4 MG disintegrating tablet Take 1 tablet (4 mg total) by mouth every 8 (eight) hours as needed for nausea or vomiting. 03/05/17   Johnn Hai, PA-C  oxyCODONE-acetaminophen (PERCOCET) 5-325 MG tablet Take 1 tablet by mouth every 6 (six) hours as needed for severe pain. 05/26/18   Johnn Hai, PA-C  potassium chloride (K-DUR) 10 MEQ tablet TAKE 1 TABLET (10 MEQ TOTAL) BY MOUTH DAILY. 09/28/15   Minna Merritts, MD  QUEtiapine (SEROQUEL XR) 300 MG 24 hr tablet Take 300 mg by mouth at bedtime.    [provider]  Respiratory Therapy Supplies (FLUTTER) DEVI Use 10-15 times daily DX: Mild Bronchiectasis DX Code:J47.1 01/10/17   Wilhelmina Mcardle, MD  SUMAtriptan (IMITREX) 25 MG tablet Take 25 mg by mouth every 2 (two) hours as needed for migraine. May repeat in 2 hours if headache persists or recurs.    [provider]  tiZANidine (ZANAFLEX) 4 MG tablet Take 4 mg by mouth every 8 (eight) hours as needed for muscle spasms.    [provider]  topiramate (TOPAMAX) 25 MG tablet Take 25 mg by mouth at bedtime.  02/27/15   [provider]  traMADol (ULTRAM) 50 MG tablet Take 50 mg by mouth at bedtime.     [provider]    [place]ank; Shellfish allergy; Sulfa antibiotics; and Bee venom  Family History  Problem Relation Age of Onset  . COPD Mother   . Heart Problems Mother   . High Cholesterol Mother   . Hypertension Father   . AAA (abdominal aortic aneurysm) Sister   . COPD Sister   . High Cholesterol Sister     Social History Social History   Tobacco Use  . Smoking status: Never Smoker  . Smokeless tobacco: Never Used  Substance Use Topics  . Alcohol use: No    Alcohol/week: 0.0 oz  . Drug use: No    Review of Systems Constitutional: No fever/chills Eyes: No visual changes. ENT: No trauma Cardiovascular:  Denies chest pain. Respiratory: Denies shortness of breath. Gastrointestinal: No abdominal pain.  No nausea, no vomiting.  Musculoskeletal: Positive for left wrist pain, positive for right low back pain. Skin: Positive for abrasions. Neurological: Negative for headaches, focal weakness or numbness. ____________________________________________   PHYSICAL EXAM:  VITAL SIGNS: ED Triage Vitals  Enc Vitals Group     BP 05/26/18 1159 140/84     Pulse Rate 05/26/18 1159 (!) 52     Resp 05/26/18 1159 15     Temp 05/26/18 1159 97.8 F (36.6 C)     Temp Source 05/26/18 1159 Oral     SpO2 05/26/18 1159 98 %  Weight 05/26/18 1158 181 lb (82.1 kg)     Height 05/26/18 1158 5\' 2"  (1.575 m)     Head Circumference --      Peak Flow --      Pain Score 05/26/18 1157 10     Pain Loc --      Pain Edu? --      Excl. in [place]? --    Constitutional: Alert and oriented. Well appearing and in no acute distress. Eyes: Conjunctivae are normal. PERRL. EOMI. Head: Atraumatic. Nose: No trauma. Neck: No stridor.  No cervical tenderness on palpation posteriorly. Cardiovascular: Normal rate, regular rhythm. Grossly normal heart sounds.  Good peripheral circulation. Respiratory: Normal respiratory effort.  No retractions. Lungs CTAB. Gastrointestinal: Soft and nontender. No distention. No CVA tenderness. Musculoskeletal: On examination of the left wrist there is questionable deformity but moderate soft tissue swelling is present.  Range of motion is restricted completely secondary to patient's pain.  She is able to move digits without any difficulty and motor sensory function is intact.  Skin in this area is also intact.  There is no tenderness on palpation of the elbow or shoulder.  lower extremities are without pain.  Examination of the back there is some diffuse tenderness and also some abrasions noted especially to the right posterior hip without any active bleeding.  No foreign body is present. Neurologic:   Normal speech and language. No gross focal neurologic deficits are appreciated. No gait instability. Skin:  Skin is warm, dry and intact. No rash noted. Psychiatric: Mood and affect are normal. Speech and behavior are normal.  ____________________________________________   LABS (all labs ordered are listed, but only abnormal results are displayed)  Labs Reviewed - No data to display  RADIOLOGY  ED MD interpretation:   Left wrist x-ray is positive for fracture distal radius nondisplaced.  Official radiology report(s): Dg Wrist Complete Left  Result Date: 05/26/2018 CLINICAL DATA:  Recent fall with left wrist pain and deformity EXAM: LEFT WRIST - COMPLETE 3+ VIEW COMPARISON:  None. FINDINGS: There is a nondisplaced fracture involving the left radial styloid extending to the radiocarpal joint space. No other acute bony abnormality is seen. The left ulnar styloid is intact. The carpal bones are in normal position. IMPRESSION: Fracture of the left radial styloid involving the radiocarpal joint space. No displacement. Electronically Signed   By: Ivar Drape M.D.   On: 05/26/2018 13:47    ____________________________________________   PROCEDURES  Procedure(s) performed:   .Splint Application Date/Time: 9/60/4540 4:57 PM Performed by: Brayton Caves, NT Authorized by: Johnn Hai, PA-C   Consent:    Consent obtained:  Verbal   Consent given by:  Patient   Risks discussed:  Pain and swelling   Alternatives discussed:  Referral Pre-procedure details:    Sensation:  Normal Procedure details:    Laterality:  Left   Location:  Wrist   Wrist:  L wrist   Strapping: no     Splint type:  Sugar tong   Supplies:  Elastic bandage and Ortho-Glass Post-procedure details:    Pain:  Improved   Sensation:  Normal   Patient tolerance of procedure:  Tolerated well, no immediate complications    Critical Care performed: No  ____________________________________________   INITIAL  IMPRESSION / ASSESSMENT AND PLAN / ED COURSE  As part of my medical decision making, I reviewed the following data within the electronic MEDICAL RECORD NUMBER Notes from prior ED visits and [place] Controlled Substance Database  Patient  is here with complaint of left wrist pain after falling.  She was made aware that she does have a fracture of her distal radius that is non-displaced.  Patient was placed in a sugar tong OCL and sling.  Patient was given Percocet every 6 hours as needed for pain and ibuprofen 600 mg 3 times daily with food.  She is to follow-up with Dr. Posey Pronto who is on-call for orthopedics at [facility].  We discussed ice and elevation to reduce swelling.  ____________________________________________   FINAL CLINICAL IMPRESSION(S) / ED DIAGNOSES  Final diagnoses:  Closed nondisplaced fracture of styloid process of left radius, initial encounter     ED Discharge Orders        Ordered    oxyCODONE-acetaminophen (PERCOCET) 5-325 MG tablet  Every 6 hours PRN     05/26/18 1434    ibuprofen (IBU) 600 MG tablet  Every 8 hours PRN     05/26/18 1434       Note:  This document was prepared using Dragon voice recognition software and may include unintentional dictation errors.    Johnn Hai, PA-C 05/26/18 1658    Earleen Newport, MD 05/27/18 [phone]

## 2018-05-28 ENCOUNTER — Other Ambulatory Visit: Payer: Self-pay | Admitting: Obstetrics and Gynecology

## 2018-05-28 ENCOUNTER — Other Ambulatory Visit: Payer: Self-pay

## 2018-05-28 DIAGNOSIS — Z1231 Encounter for screening mammogram for malignant neoplasm of breast: Secondary | ICD-10-CM

## 2018-05-28 NOTE — Patient Outreach (Signed)
[facility]) Care Management  05/28/2018  [facility] November 29, 1965 [id number]   Referral Date: 05/28/18 Referral Source: [facility] Referral Reason: need co-pay assistance with migraine medication.   Outreach Attempt #1 spoke with patient.  She is able to verify HIPAA.  Discussed reason for the referral.  Patient laughed and stated she does not need any assistance with her migraine medication. She states she told the young lady that her migraine medication is the only one that has a co-pay and told her it was less than $20.00 and she has no problems paying that.  Patient declines any other needs at this time.     Discussed [facility] services and patient declined as she has no needs presently.  Patient agreeable to receive letter and brochure.     Plan: RN CM will send letter and brochure and close case.    Jone Baseman, RN, MSN [facility] Care Management Care Management Coordinator Direct Line [phone] Toll Free: [phone]  Fax: [phone]

## 2018-05-29 ENCOUNTER — Other Ambulatory Visit: Payer: Self-pay

## 2018-05-29 DIAGNOSIS — R918 Other nonspecific abnormal finding of lung field: Secondary | ICD-10-CM | POA: Insufficient documentation

## 2018-05-29 DIAGNOSIS — Z1211 Encounter for screening for malignant neoplasm of colon: Secondary | ICD-10-CM

## 2018-06-02 DIAGNOSIS — M25532 Pain in left wrist: Secondary | ICD-10-CM | POA: Diagnosis not present

## 2018-06-02 DIAGNOSIS — W1789XA Other fall from one level to another, initial encounter: Secondary | ICD-10-CM | POA: Diagnosis not present

## 2018-06-02 DIAGNOSIS — S52572A Other intraarticular fracture of lower end of left radius, initial encounter for closed fracture: Secondary | ICD-10-CM | POA: Diagnosis not present

## 2018-06-09 ENCOUNTER — Telehealth: Payer: Self-pay | Admitting: Gastroenterology

## 2018-06-09 NOTE — Telephone Encounter (Signed)
Pt left vm to cancel procedure 06/12/18 she states she is in a cast with her Arm and she is in Pain she wants to r/s

## 2018-06-10 NOTE — Telephone Encounter (Signed)
Patients colonoscopy has been canceled due to arm injury.  She will call back to reschedule.  Thanks [employer]

## 2018-06-12 ENCOUNTER — Ambulatory Visit: Admission: RE | Admit: 2018-06-12 | Payer: PPO | Source: Ambulatory Visit | Admitting: Gastroenterology

## 2018-06-12 ENCOUNTER — Encounter: Admission: RE | Payer: Self-pay | Source: Ambulatory Visit

## 2018-06-12 ENCOUNTER — Ambulatory Visit
Admission: RE | Admit: 2018-06-12 | Discharge: 2018-06-12 | Disposition: A | Discharge status: Home / Self Care | Payer: PPO | Source: Ambulatory Visit | Attending: Orthopedic Surgery | Admitting: Orthopedic Surgery

## 2018-06-12 ENCOUNTER — Other Ambulatory Visit: Payer: Self-pay | Admitting: Orthopedic Surgery

## 2018-06-12 DIAGNOSIS — S52572D Other intraarticular fracture of lower end of left radius, subsequent encounter for closed fracture with routine healing: Secondary | ICD-10-CM | POA: Insufficient documentation

## 2018-06-12 DIAGNOSIS — S52592G Other fractures of lower end of left radius, subsequent encounter for closed fracture with delayed healing: Secondary | ICD-10-CM | POA: Diagnosis not present

## 2018-06-12 DIAGNOSIS — X58XXXD Exposure to other specified factors, subsequent encounter: Secondary | ICD-10-CM | POA: Diagnosis not present

## 2018-06-12 SURGERY — COLONOSCOPY WITH PROPOFOL
Anesthesia: General

## 2018-06-24 DIAGNOSIS — S52572D Other intraarticular fracture of lower end of left radius, subsequent encounter for closed fracture with routine healing: Secondary | ICD-10-CM | POA: Diagnosis not present

## 2018-07-02 DIAGNOSIS — R740 Nonspecific elevation of levels of transaminase and lactic acid dehydrogenase [LDH]: Secondary | ICD-10-CM | POA: Diagnosis not present

## 2018-07-02 DIAGNOSIS — Z1389 Encounter for screening for other disorder: Secondary | ICD-10-CM | POA: Diagnosis not present

## 2018-07-02 DIAGNOSIS — R55 Syncope and collapse: Secondary | ICD-10-CM | POA: Diagnosis not present

## 2018-07-02 DIAGNOSIS — Z1211 Encounter for screening for malignant neoplasm of colon: Secondary | ICD-10-CM | POA: Diagnosis not present

## 2018-07-02 DIAGNOSIS — S62102S Fracture of unspecified carpal bone, left wrist, sequela: Secondary | ICD-10-CM | POA: Diagnosis not present

## 2018-07-06 DIAGNOSIS — S52572D Other intraarticular fracture of lower end of left radius, subsequent encounter for closed fracture with routine healing: Secondary | ICD-10-CM | POA: Diagnosis not present

## 2018-07-14 ENCOUNTER — Encounter: Payer: Self-pay | Admitting: Occupational Therapy

## 2018-07-14 ENCOUNTER — Ambulatory Visit: Payer: PPO | Attending: Orthopedic Surgery | Admitting: Occupational Therapy

## 2018-07-14 ENCOUNTER — Other Ambulatory Visit: Payer: Self-pay

## 2018-07-14 DIAGNOSIS — R208 Other disturbances of skin sensation: Secondary | ICD-10-CM | POA: Diagnosis not present

## 2018-07-14 DIAGNOSIS — M25532 Pain in left wrist: Secondary | ICD-10-CM

## 2018-07-14 DIAGNOSIS — M6281 Muscle weakness (generalized): Secondary | ICD-10-CM | POA: Diagnosis not present

## 2018-07-14 DIAGNOSIS — M25642 Stiffness of left hand, not elsewhere classified: Secondary | ICD-10-CM | POA: Diagnosis not present

## 2018-07-14 DIAGNOSIS — M79642 Pain in left hand: Secondary | ICD-10-CM

## 2018-07-14 DIAGNOSIS — M25632 Stiffness of left wrist, not elsewhere classified: Secondary | ICD-10-CM

## 2018-07-14 NOTE — Patient Instructions (Signed)
Contrast  fitted with prefab thumb spica to use   AAROM for wrist flexion and extention AAROM RD, UD  AROM for sup /pro Pain free -slight pull or stretch  Less than 2-3/10    AROM tendon glides  Opposiiton to all digits  5 reps  Each  PA and RA - pain free  2 x day

## 2018-07-14 NOTE — Therapy (Signed)
[facility] [address], [place], [address] Phone: [phone]   Fax:  [phone]  Occupational Therapy Evaluation  Patient Details  Name: Melinda Shaw MRN: [id number] Date of Birth: [birth year] Referring Provider: Rudene Christians   Encounter Date: 07/14/2018  OT End of Session - 07/14/18 1530    Visit Number  1    Number of Visits  12    Date for OT Re-Evaluation  08/25/18    OT Start Time  0850    OT Stop Time  0944    OT Time Calculation (min)  54 min    Activity Tolerance  Patient tolerated treatment well;Treatment limited secondary to medical complications (Comment)       Past Medical History:  Diagnosis Date  . Anemia    H/O  . Anxiety   . Bronchitis   . COPD (chronic obstructive pulmonary disease) ([place])   . Depression   . Fibromyalgia   . GERD (gastroesophageal reflux disease)   . Headache   . Hyperlipidemia   . Hypertension    no meds  . PONV (postoperative nausea and vomiting)   . Shortness of breath dyspnea     Past Surgical History:  Procedure Laterality Date  . ANTERIOR CRUCIATE LIGAMENT REPAIR    . CHOLECYSTECTOMY    . ESOPHAGOGASTRODUODENOSCOPY (EGD) WITH PROPOFOL N/A 08/28/2015   Procedure: ESOPHAGOGASTRODUODENOSCOPY (EGD) WITH PROPOFOL;  Surgeon: Josefine Class, MD;  Location: [facility] ENDOSCOPY;  Service: Endoscopy;  Laterality: N/A;  . KNEE SURGERY     left knee   . MASS EXCISION Left 01/26/2016   Procedure: EXCISION OF LEFT WRIST VOLAR MASS;  Surgeon: Charlotte Crumb, MD;  Location: [place];  Service: Orthopedics;  Laterality: Left;  . NOSE SURGERY    . POLYPECTOMY    . TUBAL LIGATION    . VIDEO BRONCHOSCOPY N/A 05/29/2015   Procedure: VIDEO BRONCHOSCOPY WITHOUT FLUORO;  Surgeon: Vilinda Boehringer, MD;  Location: [facility] ORS;  Service: Cardiopulmonary;  Laterality: N/A;    There were no vitals filed for this visit.  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 , play with grandkids      AROM   Right Wrist  Extension  68 Degrees    Right Wrist Flexion  88 Degrees    Right Wrist Radial Deviation  22 Degrees    Right Wrist Ulnar Deviation  38 Degrees    Left Wrist Extension  46 Degrees    Left Wrist Flexion  30 Degrees    Left Wrist Radial Deviation  22 Degrees    Left Wrist Ulnar Deviation  22 Degrees      Left Hand AROM   L Thumb Radial ADduction/ABduction 0-55  55   pain   L Thumb Palmar ADduction/ABduction 0-45  45    L Thumb Opposition to Index  100 cm    L Index  MCP 0-90  80 Degrees    L Long  MCP 0-90  80 Degrees    L Long PIP 0-100  90 Degrees    L Ring  MCP 0-90  80 Degrees    L Ring PIP 0-100  90 Degrees    L Little  MCP 0-90  80 Degrees    L Little PIP 0-100  100 Degrees       fluidotherapy done for AROM increase at wrist - but did increase pain over 1st dorsal compartment  Pt ed on HEP  Ice at end  Thumb spica fitted to wear - prefab     Contrast  fitted with prefab thumb spica to use   AAROM for wrist flexion and extention AAROM RD, UD  AROM for sup /pro Pain free  -slight pull or stretch  Less than 2-3/10    AROM tendon glides  Opposiiton to all digits  5 reps  Each  PA and RA - pain free  2 x day   Pt ed on modifications - do not prop up elbow and bend more than 90 degrees - cradle , carry or sleep with arm on chest            OT Education - 07/14/18 1530    Education Details  findings of eval and HEP     Person(s) Educated  Patient    Methods  Explanation;Demonstration;Handout    Comprehension  Verbalized understanding;Returned demonstration       OT Short Term Goals - 07/14/18 1731      OT SHORT TERM GOAL #1   Title  Pain on PRWHE improve with more than 20 points    Baseline  PRWHE score for pain at eval 47/50    Time  3    Period  Weeks    Status  New    Target Date  08/04/18      OT SHORT TERM GOAL #2   Title  Pt to be ind in HEP to decrease pain and sensation changes and increase ROM and strength     Baseline  no knowledge of HEP     Time  3    Period  Weeks    Status  New    Target Date  08/04/18        OT Long Term Goals - 07/14/18 1732      OT LONG TERM GOAL #1   Title  AROM in L wrist improve to [facility] to use hand in turning doorknob and use in ADL;s more than 50%     Baseline  L wrist AROM - decrease see flowsheet    Time  4    Period  Weeks    Status  New    Target Date  08/11/18      OT  LONG TERM GOAL #2   Title  DIgits AROM in L hand increase to WNL to use hand in bathing , dressing , fasten buttons    Baseline  MC and PIP flexion decrease and thumb AROM - see flowsheet     Time  3    Period  Weeks    Status  New    Target Date  08/04/18      OT LONG TERM GOAL #3   Title  Grip strength in L hand increase to 50 % or more compare to R hand to cut food , carry more than 3 lbs without increase symptoms     Baseline  NT - 6 wks out - pain icnrease to 8/10     Time  6    Period  Weeks    Status  New    Target Date  08/18/18      OT LONG TERM GOAL #4   Title  L wrist strength in all planes  increase to 4/15 without any increase symptoms for function score to be improve more than 20 points     Baseline  Strength NT - only AROM - and function score on PRWHE at eval 47/50    Time  6    Period  Weeks    Status  New    Target Date  08/18/18            Plan - 07/14/18 1531    Clinical Impression Statement  Pt present 6 wks out from closed intra-articular fracture of distal end of left radius with routine healing, subsequent encounter - CT and xray showed healing and order to start ROM - pt on prefab wrist splint to wear all the time except with ADL's and working on AROM - pt was on steriod for tingling and CT symptoms- pt did show positive Tinel over Cubital tunnel  - pt ed on not cradling arm , holding on chest or  propping it up - will assess CTS next session - pt report still tingling - pt  tender over 1st dorsal compartment and positive Finkelstein - 8/10 - pt was fitted with thumb spica to immobilize thumb too until communication with MD - show decrease AROM in all digits and wrist , in all planes - pt increase pain 8/10 at the worse - decrease strength - all named limiting her functional use - would like to do few ionto sessions with dexamethazone to decrease symptoms of 1st dorsal compartment - pt can benefit from OT services     Occupational performance deficits (Please refer to evaluation for details):  ADL's;IADL's;Play;Leisure;Social Participation    Rehab Potential  Good    Current Impairments/barriers affecting progress:  sensation changes , pain over 1st dorsal compartment - positive Finkelstein,     OT Frequency  2x / week    OT Duration  6 weeks    OT Treatment/Interventions  Therapeutic exercise;Iontophoresis;Self-care/ADL training;Splinting;Patient/family education;Fluidtherapy;Contrast Bath;Manual Therapy;Passive range of motion    Plan  progress with HEP - check with MD about ionto with dexamethazone    Clinical Decision Making  Multiple treatment options,  significant modification of task necessary    OT Home Exercise Plan  see pt instruction    Consulted and Agree with Plan of Care  Patient       Patient will benefit from skilled therapeutic intervention in order to improve the following deficits and impairments:  Pain, Impaired flexibility, Increased edema, Decreased coordination, Impaired sensation, Decreased strength, Decreased range  of motion, Decreased knowledge of precautions, Impaired UE functional use  Visit Diagnosis: Pain in left hand - Plan: Ot plan of care cert/re-cert  Pain in left wrist - Plan: Ot plan of care cert/re-cert  Other disturbances of skin sensation - Plan: Ot plan of care cert/re-cert  Stiffness of left hand, not elsewhere classified - Plan: Ot plan of care cert/re-cert  Stiffness of left wrist, not elsewhere classified - Plan: Ot plan of care cert/re-cert  Muscle weakness - Plan: Ot plan of care cert/re-cert    Problem List Patient Active Problem List   Diagnosis Date Noted  . Lung infiltrate   . Pulmonary infiltrate in right lung on chest x-ray   . Pulmonary infiltrate in left lung on chest x-ray   . Reactive airway disease 03/21/2015  . Dyspnea 02/22/2015  . Cough 02/22/2015  . Acute upper respiratory infection 02/22/2015    Rosalyn Gess OTR/L,CLT 07/14/2018, 5:40 PM  [facility] [address], [place], [address] Phone: [phone]   Fax:  [phone]  Name: Melinda Shaw MRN: [id number] Date of Birth: Nov 09, 1966

## 2018-07-20 ENCOUNTER — Ambulatory Visit: Payer: PPO | Admitting: Occupational Therapy

## 2018-07-20 DIAGNOSIS — R208 Other disturbances of skin sensation: Secondary | ICD-10-CM

## 2018-07-20 DIAGNOSIS — M25642 Stiffness of left hand, not elsewhere classified: Secondary | ICD-10-CM

## 2018-07-20 DIAGNOSIS — M79642 Pain in left hand: Secondary | ICD-10-CM | POA: Diagnosis not present

## 2018-07-20 DIAGNOSIS — M25532 Pain in left wrist: Secondary | ICD-10-CM

## 2018-07-20 DIAGNOSIS — M25632 Stiffness of left wrist, not elsewhere classified: Secondary | ICD-10-CM

## 2018-07-20 DIAGNOSIS — M6281 Muscle weakness (generalized): Secondary | ICD-10-CM

## 2018-07-20 NOTE — Patient Instructions (Signed)
Same HEP   but add gentle prayer stretch prior to AROM  And Med N glide - 5 reps 2-3 x day Tendon glides - AROM 10 reps  2-3 x day   can do contrast up to 3 x

## 2018-07-20 NOTE — Therapy (Signed)
[facility] [address] [place], [address] Phone: [phone]   Fax:  [phone]  Occupational Therapy Treatment  Patient Details  Name: Melinda Shaw MRN: [id number] Date of Birth: Dec 19, 1965 Referring Provider: Rudene Christians   Encounter Date: 07/20/2018  OT End of Session - 07/20/18 1044    Visit Number  2    Number of Visits  12    Date for OT Re-Evaluation  08/25/18    OT Start Time  0846    OT Stop Time  0931    OT Time Calculation (min)  45 min    Activity Tolerance  Patient tolerated treatment well;Treatment limited secondary to medical complications (Comment)       Past Medical History:  Diagnosis Date  . Anemia    H/O  . Anxiety   . Bronchitis   . COPD (chronic obstructive pulmonary disease) ([place])   . Depression   . Fibromyalgia   . GERD (gastroesophageal reflux disease)   . Headache   . Hyperlipidemia   . Hypertension    no meds  . PONV (postoperative nausea and vomiting)   . Shortness of breath dyspnea     Past Surgical History:  Procedure Laterality Date  . ANTERIOR CRUCIATE LIGAMENT REPAIR    . CHOLECYSTECTOMY    . ESOPHAGOGASTRODUODENOSCOPY (EGD) WITH PROPOFOL N/A 08/28/2015   Procedure: ESOPHAGOGASTRODUODENOSCOPY (EGD) WITH PROPOFOL;  Surgeon: Josefine Class, MD;  Location: [facility] ENDOSCOPY;  Service: Endoscopy;  Laterality: N/A;  . KNEE SURGERY     left knee   . MASS EXCISION Left 01/26/2016   Procedure: EXCISION OF LEFT WRIST VOLAR MASS;  Surgeon: Charlotte Crumb, MD;  Location: [facility];  Service: Orthopedics;  Laterality: Left;  . NOSE SURGERY    . POLYPECTOMY    . TUBAL LIGATION    . VIDEO BRONCHOSCOPY N/A 05/29/2015   Procedure: VIDEO BRONCHOSCOPY WITHOUT FLUORO;  Surgeon: Vilinda Boehringer, MD;  Location: [facility] ORS;  Service: Cardiopulmonary;  Laterality: N/A;    There were no vitals filed for this visit.  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  Yes    Pain Score  4     Pain Location  Wrist    Pain Orientation  Left    Pain Descriptors / Indicators  Aching;Tightness    Pain Onset  More than a month ago         [facility] OT Assessment - 07/20/18 0001      AROM   Left Wrist Extension  48 Degrees    Left Wrist Flexion  60 Degrees    Left Wrist Radial Deviation  22 Degrees    Left Wrist Ulnar Deviation  33 Degrees      Left Hand AROM   L Thumb Radial ADduction/ABduction 0-55  60    L Thumb Palmar ADduction/ABduction 0-45  90        assess AROM for wrist and thumb in all planes  great progress except wrist extetnion   Still tender over distal radius head and positive FInkelstein  Increase pins and needles with use- but not constant   decrease numbness in pinkie And not tender over Cubital tunnel      OT Treatments/Exercises (OP) - 07/20/18 0001      LUE Fluidotherapy   Number Minutes Fluidotherapy  11 Minutes    LUE Fluidotherapy Location  Hand;Wrist    Comments  alternate 2 x with 1 min ice to decrease sensory issue , pain and increase ROM       soft tissue mobs to Carpal spreads And Graston tool nr 2 over volar and dorsal forearm - sweeping and brushing over volar palm and digits   Prayer stretch gentle done 10 reps  slight pull  AAROM for wrist flexion and extention - but focus on extention more than flexion with CTS  AAROM RD, UD  AROM for sup /pro Pain free -slight pull or stretch  Reinforce  Less than 2-3/10    AROM tendon glides  - review again - only AROM  Opposiiton to all digits  5 reps  Each  PA and RA - pain free  2 x day   Cont thumb spica prefab and contrast and/or ice   pain at end of session less than 2/10      OT Education - 07/20/18 1043    Education Details  review of HEP - progress and what to focus on     Person(s) Educated  Patient    Methods  Explanation;Demonstration;Handout    Comprehension  Verbalized understanding;Returned demonstration       OT Short Term Goals - 07/14/18 1731      OT SHORT TERM GOAL #1   Title  Pain on PRWHE improve with more than 20 points    Baseline  PRWHE score for pain at eval 47/50    Time  3    Period  Weeks    Status  New    Target Date  08/04/18      OT SHORT TERM GOAL #2   Title  Pt to be ind in HEP to decrease pain and sensation changes and increase ROM and strength     Baseline  no knowledge of HEP     Time  3    Period  Weeks    Status  New    Target Date  08/04/18        OT Long Term Goals - 07/14/18 1732      OT LONG TERM GOAL #1   Title  AROM in L wrist improve to [facility] to use hand in turning doorknob and use in ADL;s more than 50%     Baseline  L wrist AROM - decrease see flowsheet    Time  4    Period  Weeks    Status  New    Target Date  08/11/18      OT LONG TERM GOAL #2   Title  DIgits AROM in L hand increase to WNL to use hand in bathing , dressing , fasten buttons    Baseline  MC and PIP flexion decrease and thumb AROM - see flowsheet     Time  3    Period  Weeks    Status  New    Target Date  08/04/18      OT LONG TERM GOAL #3   Title  Grip strength in L hand increase to 50 % or more  compare to R hand to cut food , carry more than 3 lbs without increase symptoms     Baseline  NT - 6 wks out - pain icnrease to 8/10     Time  6    Period  Weeks    Status  New    Target Date  08/18/18      OT LONG TERM GOAL #4   Title  L wrist strength in all planes increase to 4/15 without any increase symptoms for function score to be improve more than 20 points     Baseline  Strength NT - only AROM - and function score on PRWHE at  eval 47/50    Time  6    Period  Weeks    Status  New    Target Date  08/18/18            Plan - 07/20/18 1045    Clinical Impression Statement  Pt present 7 wks out from injury - pt did show decrease in pain this date compare to eval date week ago - but still have pins and needles in finger tips use - and pain at distal raduis and positive Wynn Maudlin - but negative for Cubital tunnel this date- showed great progress in AROM at all joints except wrist extention -pt t cont with HEP but without increasing pain or numbness     Occupational performance deficits (Please refer to evaluation for details):  ADL's;IADL's;Play;Leisure;Social Participation    Rehab Potential  Good    Current Impairments/barriers affecting progress:  sensation changes , pain over 1st dorsal compartment - positive Finkelstein,     OT Frequency  2x / week    OT Duration  6 weeks    OT Treatment/Interventions  Therapeutic exercise;Iontophoresis;Self-care/ADL training;Splinting;Patient/family education;Fluidtherapy;Contrast Bath;Manual Therapy;Passive range of motion    Plan  progress with HEP - check with MD about ionto with dexamethazone    Clinical Decision Making  Multiple treatment options, significant modification of task necessary    OT Home Exercise Plan  see pt instruction    Consulted and Agree with Plan of Care  Patient       Patient will benefit from skilled therapeutic intervention in order to improve the following deficits and impairments:  Pain, Impaired flexibility, Increased edema, Decreased coordination, Impaired sensation, Decreased strength, Decreased range of motion, Decreased knowledge of precautions, Impaired UE functional use  Visit Diagnosis: Pain in left hand  Pain in left wrist  Other disturbances of skin sensation  Stiffness of left hand, not elsewhere classified  Stiffness of left wrist, not elsewhere classified  Muscle weakness    Problem List Patient Active Problem List    Diagnosis Date Noted  . Lung infiltrate   . Pulmonary infiltrate in right lung on chest x-ray   . Pulmonary infiltrate in left lung on chest x-ray   . Reactive airway disease 03/21/2015  . Dyspnea 02/22/2015  . Cough 02/22/2015  . Acute upper respiratory infection 02/22/2015    Rosalyn Gess OTR/L,CLT  07/20/2018, 10:59 AM  [facility] [address], [place], [address] Phone: [phone]   Fax:  [phone]  Name: Melinda Shaw MRN: [id number] Date of Birth: [birth year]

## 2018-07-24 ENCOUNTER — Ambulatory Visit: Payer: PPO | Admitting: Occupational Therapy

## 2018-07-24 DIAGNOSIS — M6281 Muscle weakness (generalized): Secondary | ICD-10-CM

## 2018-07-24 DIAGNOSIS — M25532 Pain in left wrist: Secondary | ICD-10-CM

## 2018-07-24 DIAGNOSIS — M79642 Pain in left hand: Secondary | ICD-10-CM

## 2018-07-24 DIAGNOSIS — R208 Other disturbances of skin sensation: Secondary | ICD-10-CM

## 2018-07-24 DIAGNOSIS — M25642 Stiffness of left hand, not elsewhere classified: Secondary | ICD-10-CM

## 2018-07-24 DIAGNOSIS — M25632 Stiffness of left wrist, not elsewhere classified: Secondary | ICD-10-CM

## 2018-07-24 NOTE — Therapy (Signed)
[facility] [address] [place], [address] Phone: [phone]   Fax:  [phone]  Occupational Therapy Treatment  Patient Details  Name: Melinda Shaw MRN: [id number] Date of Birth: [birth year] Referring Provider: Rudene Christians   Encounter Date: 07/24/2018  OT End of Session - 07/24/18 0847    Visit Number  3    Number of Visits  12    Date for OT Re-Evaluation  08/25/18    OT Start Time  0848    OT Stop Time  0930    OT Time Calculation (min)  42 min    Activity Tolerance  Patient tolerated treatment well;Treatment limited secondary to medical complications (Comment)    Behavior During Therapy  [facility] for tasks assessed/performed       Past Medical History:  Diagnosis Date  . Anemia    H/O  . Anxiety   . Bronchitis   . COPD (chronic obstructive pulmonary disease) ([place])   . Depression   . Fibromyalgia   . GERD (gastroesophageal reflux disease)   . Headache   . Hyperlipidemia   . Hypertension    no meds  . PONV (postoperative nausea and vomiting)   . Shortness of breath dyspnea     Past Surgical History:  Procedure Laterality Date  . ANTERIOR CRUCIATE LIGAMENT REPAIR    . CHOLECYSTECTOMY    . ESOPHAGOGASTRODUODENOSCOPY (EGD) WITH PROPOFOL N/A 08/28/2015   Procedure: ESOPHAGOGASTRODUODENOSCOPY (EGD) WITH PROPOFOL;  Surgeon: Josefine Class, MD;  Location: [facility] ENDOSCOPY;  Service: Endoscopy;  Laterality: N/A;  . KNEE SURGERY     left knee   . MASS EXCISION Left 01/26/2016   Procedure: EXCISION OF LEFT WRIST VOLAR MASS;  Surgeon: Charlotte Crumb, MD;  Location: [facility];  Service: Orthopedics;  Laterality: Left;  . NOSE SURGERY    . POLYPECTOMY    . TUBAL LIGATION    . VIDEO BRONCHOSCOPY N/A 05/29/2015   Procedure: VIDEO BRONCHOSCOPY WITHOUT FLUORO;  Surgeon: Vilinda Boehringer, MD;  Location: [facility] ORS;  Service: Cardiopulmonary;  Laterality: N/A;    There were no vitals filed for  this visit.  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  Yes         [facility] OT Assessment - 07/24/18 0001      AROM   Left Wrist Extension  55 Degrees    Left Wrist Flexion  74 Degrees    Left Wrist Radial Deviation  24 Degrees    Left Wrist Ulnar Deviation  33 Degrees      Strength   Right Hand Grip (lbs)  65    Right Hand Lateral Pinch  17 lbs    Right Hand 3 Point Pinch  14 lbs    Left Hand Grip (lbs)  35    Left Hand Lateral Pinch  16 lbs    Left Hand 3 Point Pinch  14 lbs       assess progress in AROM of wrist and grip /prehenison strength   see flowsheet Pt not as tender over 1st dorsal compartment as last time - and Wynn Maudlin  - but still present and feel  it with act  Pt to cont with prefab thumb spica - with gripping or repetitive act that cause pain over radial wrist         OT Treatments/Exercises (OP) - 07/24/18 0001      LUE Fluidotherapy   Number Minutes Fluidotherapy  11 Minutes    LUE Fluidotherapy Location  Hand;Wrist    Comments  AROM for wrist - in all planes - 2 x cycles of ice -        soft tissue mobs to Carpal spreads And Graston tool nr 2 over volar and dorsal forearm - sweeping and brushing over volar palm and digits   Prayer stretch  10 reps  slight pull  AAROM for wrist flexion and extention - but focus on extention more than flexion with CTS  AAROM RD, UD  AROM for sup /pro Pain free -slight pull or stretch  Reinforce  Less than 2-3/10   add this date 1 lbs for wrist in all planes - 12 reps 2 x day  Can increase to 2 and 3 sets in the next week  If symptoms do not increase   add teal putty for grip - but not tight and 12 reps  increase sets over week  If no increase symptoms   Cont thumb spica prefab and  contrast and/or ice          OT Education - 07/24/18 1440    Education Details  review of HEP - progress and add 1 lbs weight     Person(s) Educated  Patient    Methods  Explanation;Demonstration;Handout    Comprehension  Verbalized understanding;Returned demonstration       OT Short Term Goals - 07/14/18 1731      OT SHORT TERM GOAL #1   Title  Pain on PRWHE improve with more than 20 points    Baseline  PRWHE score for pain at eval 47/50    Time  3    Period  Weeks    Status  New    Target Date  08/04/18      OT SHORT TERM GOAL #2   Title  Pt to be ind in HEP to decrease pain and sensation changes and increase ROM and strength     Baseline  no knowledge of HEP     Time  3    Period  Weeks    Status  New    Target Date  08/04/18        OT Long Term Goals - 07/14/18 1732      OT LONG TERM GOAL #1   Title  AROM in L wrist improve to [facility] to use hand in turning doorknob and use in ADL;s more than 50%     Baseline  L wrist AROM - decrease see flowsheet    Time  4    Period  Weeks    Status  New    Target Date  08/11/18      OT LONG TERM GOAL #2   Title  DIgits AROM in L hand increase to WNL to use hand in bathing , dressing , fasten buttons    Baseline  MC and PIP flexion decrease and thumb AROM - see flowsheet     Time  3    Period  Weeks    Status  New    Target Date  08/04/18      OT LONG TERM GOAL #3   Title  Grip strength in L  hand increase to 50 % or more compare to R hand to cut food , carry more than 3 lbs without increase symptoms     Baseline  NT - 6 wks out - pain icnrease to 8/10     Time  6    Period  Weeks    Status  New    Target Date  08/18/18      OT LONG TERM GOAL #4   Title  L wrist strength in all planes increase to 4/15 without any increase symptoms for function score to be improve more than 20 points     Baseline  Strength NT - only AROM - and function score on PRWHE at eval 47/50    Time  6    Period  Weeks    Status  New     Target Date  08/18/18            Plan - 07/24/18 0848    Clinical Impression Statement  Pt cont to make progress in wrist ROM - and use - as well pain decrease and numbness- pt to cont contrast and ROM/ strengtening without increasing symptoms - was less tender over distal radius head     Occupational performance deficits (Please refer to evaluation for details):  ADL's;IADL's;Play;Leisure;Social Participation    Rehab Potential  Good    Current Impairments/barriers affecting progress:  sensation changes , pain over 1st dorsal compartment - positive Finkelstein,     OT Frequency  2x / week    OT Duration  4 weeks    OT Treatment/Interventions  Therapeutic exercise;Iontophoresis;Self-care/ADL training;Splinting;Patient/family education;Fluidtherapy;Contrast Bath;Manual Therapy;Passive range of motion    Plan  progress with HEP -  and pain /numbness improved     Clinical Decision Making  Multiple treatment options, significant modification of task necessary    OT Home Exercise Plan  see pt instruction    Consulted and Agree with Plan of Care  Patient       Patient will benefit from skilled therapeutic intervention in order to improve the following deficits and impairments:  Pain, Impaired flexibility, Increased edema, Decreased coordination, Impaired sensation, Decreased strength, Decreased range of motion, Decreased knowledge of precautions, Impaired UE functional use  Visit Diagnosis: Pain in left hand  Pain in left wrist  Other disturbances of skin sensation  Stiffness of left hand, not elsewhere classified  Stiffness of left wrist, not elsewhere classified  Muscle weakness    Problem List Patient Active Problem List   Diagnosis Date Noted  . Lung infiltrate   . Pulmonary infiltrate in right lung on chest x-ray   . Pulmonary infiltrate in left lung on chest x-ray   . Reactive airway disease 03/21/2015  . Dyspnea 02/22/2015  . Cough 02/22/2015  . Acute upper  respiratory infection 02/22/2015    Rosalyn Gess OTR/L,CLT 07/24/2018, 2:51 PM  [facility] [address] [place], [address] Phone: [phone]   Fax:  [phone]  Name: Melinda Shaw MRN: [id number] Date of Birth: [birth year]

## 2018-07-24 NOTE — Patient Instructions (Signed)
Cont with contrast  And wrist extention Add 1 lbs weight for wrist in all planes  2 reps  Can increase sets every 3 days if no increase symptoms  Add teal putty for gripping  - 12 reps  can increase sets too over the week  If no increase symptoms  But not prehension

## 2018-07-27 ENCOUNTER — Ambulatory Visit: Payer: PPO | Admitting: Occupational Therapy

## 2018-07-31 ENCOUNTER — Ambulatory Visit: Payer: PPO | Admitting: Occupational Therapy

## 2018-08-26 DIAGNOSIS — J441 Chronic obstructive pulmonary disease with (acute) exacerbation: Secondary | ICD-10-CM | POA: Diagnosis not present

## 2018-09-04 DIAGNOSIS — N939 Abnormal uterine and vaginal bleeding, unspecified: Secondary | ICD-10-CM | POA: Diagnosis not present

## 2018-09-04 DIAGNOSIS — R079 Chest pain, unspecified: Secondary | ICD-10-CM | POA: Diagnosis not present

## 2018-09-04 DIAGNOSIS — R109 Unspecified abdominal pain: Secondary | ICD-10-CM | POA: Diagnosis not present

## 2018-09-08 ENCOUNTER — Encounter: Payer: Self-pay | Admitting: Pulmonary Disease

## 2018-09-14 ENCOUNTER — Ambulatory Visit: Payer: PPO | Admitting: Pulmonary Disease

## 2018-09-16 ENCOUNTER — Ambulatory Visit (INDEPENDENT_AMBULATORY_CARE_PROVIDER_SITE_OTHER): Payer: PPO | Admitting: Pulmonary Disease

## 2018-09-16 ENCOUNTER — Encounter: Payer: Self-pay | Admitting: Pulmonary Disease

## 2018-09-16 ENCOUNTER — Ambulatory Visit (INDEPENDENT_AMBULATORY_CARE_PROVIDER_SITE_OTHER): Payer: PPO | Admitting: Internal Medicine

## 2018-09-16 ENCOUNTER — Encounter: Payer: Self-pay | Admitting: Internal Medicine

## 2018-09-16 VITALS — BP 124/84 | HR 63 | Ht 62.0 in | Wt 187.6 lb

## 2018-09-16 VITALS — BP 128/84 | HR 67 | Ht 62.0 in | Wt 188.8 lb

## 2018-09-16 DIAGNOSIS — R0602 Shortness of breath: Secondary | ICD-10-CM

## 2018-09-16 DIAGNOSIS — R079 Chest pain, unspecified: Secondary | ICD-10-CM | POA: Diagnosis not present

## 2018-09-16 DIAGNOSIS — J479 Bronchiectasis, uncomplicated: Secondary | ICD-10-CM

## 2018-09-16 DIAGNOSIS — R0789 Other chest pain: Secondary | ICD-10-CM

## 2018-09-16 DIAGNOSIS — I200 Unstable angina: Secondary | ICD-10-CM | POA: Insufficient documentation

## 2018-09-16 DIAGNOSIS — R0609 Other forms of dyspnea: Secondary | ICD-10-CM

## 2018-09-16 MED ORDER — ISOSORBIDE MONONITRATE ER 30 MG PO TB24: 15.0000 mg | ORAL_TABLET | Freq: Every day | ORAL | 3 refills | Status: DC

## 2018-09-16 MED ORDER — ASPIRIN EC 81 MG PO TBEC: 81.0000 mg | DELAYED_RELEASE_TABLET | Freq: Every day | ORAL | 3 refills | Status: DC

## 2018-09-16 MED ORDER — NITROGLYCERIN 0.4 MG SL SUBL: 0.4000 mg | SUBLINGUAL_TABLET | SUBLINGUAL | 6 refills | Status: DC | PRN

## 2018-09-16 NOTE — H&P (View-Only) (Signed)
New Outpatient Visit Date: 09/16/2018  Referring Provider: Cristy Folks, PA-C [place], [place] [id number]  Chief Complaint: Shortness of breath and chest pain  HPI:  Melinda Shaw is a 52 y.o. female who is being seen today for the evaluation of terms of breath and chest pain at the request of [name]. She has a history of hypertension, hyperlipidemia, bronchiectasis, fibromyalgia, migraine headaches, and anxiety/depression.  She reports several months of progressive shortness of breath and chest discomfort, for which she has been referred to [place] and pulmonology.  She was evaluated by Dr. Stevenson Clinch several years ago due to bronchiectasis and shortness of breath.  She was seen by Dr. Alva Garnet earlier today and reported symptoms concerning for unstable angina.  Her appointment was therefore moved up to today.  [name] reports increasing chest pressure that began about a month ago.  She feels heaviness across her precordium extending to the upper abdomen.  It can happen even at rest but seems to be worsened with activity.  She notes accompanying shortness of breath as well as nausea and dizziness.  The pain typically lasts 20 to 30 minutes and does not radiate.  She sometimes feels like the room is spinning around her.  She also notes a syncopal episode in July.  She was standing her porch and suddenly fell, landing in a flower bed.  She does not know how long she was out for.  There were no prodromal symptoms.  She has not had any palpitations.  [name] denies a history of prior cardiac disease or ischemia testing.  She was recently treated for bronchitis with prednisone and 2 courses of azithromycin.  She has also been started on ondansetron for her nausea with some improvement.  She has chronic intermittent migraines for which she is on prophylactic treatment with propranolol as well as as needed therapy with  Imitrex.  --------------------------------------------------------------------------------------------------  Cardiovascular History & Procedures: Cardiovascular Problems:  Chest pain  Shortness of breath and history of bronchiectasis  Risk Factors:  Hypertension, hyperlipidemia, and obesity  Cath/PCI:  None  CV Surgery:  None  EP Procedures and Devices:  None  Non-Invasive Evaluation(s):  TTE (04/17/2015): Normal LV size and wall thickness.  LVEF 60 to 65% with normal wall motion and diastolic function.  Normal RV size and function.  No significant valvular abnormalities.  Recent CV Pertinent Labs: Lab Results  Component Value Date   BNP 10 02/06/2015   K 3.9 01/25/2016   K 3.1 (L) 02/06/2015   BUN 13 01/25/2016   BUN 14 02/06/2015   CREATININE 0.88 01/25/2016   CREATININE 0.73 02/06/2015    --------------------------------------------------------------------------------------------------  Past Medical History:  Diagnosis Date  . Anemia    H/O  . Anxiety   . Bronchitis   . Depression   . Fibromyalgia   . GERD (gastroesophageal reflux disease)   . Headache   . Hyperlipidemia   . Hypertension    no meds  . PONV (postoperative nausea and vomiting)   . Shortness of breath dyspnea     Past Surgical History:  Procedure Laterality Date  . ANTERIOR CRUCIATE LIGAMENT REPAIR    . CHOLECYSTECTOMY    . ESOPHAGOGASTRODUODENOSCOPY (EGD) WITH PROPOFOL N/A 08/28/2015   Procedure: ESOPHAGOGASTRODUODENOSCOPY (EGD) WITH PROPOFOL;  Surgeon: Josefine Class, MD;  Location: [facility] ENDOSCOPY;  Service: Endoscopy;  Laterality: N/A;  . KNEE SURGERY     left knee   . MASS EXCISION Left 01/26/2016   Procedure: EXCISION  OF LEFT WRIST VOLAR MASS;  Surgeon: Charlotte Crumb, MD;  Location: [place];  Service: Orthopedics;  Laterality: Left;  . NOSE SURGERY    . POLYPECTOMY    . TUBAL LIGATION    . VIDEO BRONCHOSCOPY N/A 05/29/2015   Procedure: VIDEO  BRONCHOSCOPY WITHOUT FLUORO;  Surgeon: Vilinda Boehringer, MD;  Location: [facility] ORS;  Service: Cardiopulmonary;  Laterality: N/A;    Current Meds  Medication Sig  . albuterol (PROVENTIL HFA;VENTOLIN HFA) 108 (90 BASE) MCG/ACT inhaler Inhale 2 puffs into the lungs every 6 (six) hours as needed for wheezing or shortness of breath.  . citalopram (CELEXA) 20 MG tablet Take 20 mg by mouth every morning.   . cyclobenzaprine (FLEXERIL) 10 MG tablet Take 1 tablet (10 mg total) by mouth 3 (three) times daily as needed for muscle spasms.  [name] eletriptan (RELPAX) 20 MG tablet Take 20 mg by mouth every 2 (two) hours as needed.   [name] EPINEPHrine (EPIPEN 2-PAK) 0.3 mg/0.3 mL IJ SOAJ injection Inject 0.3 mLs (0.3 mg total) into the muscle once.  . gabapentin (NEURONTIN) 100 MG capsule Take 200 mg by mouth at bedtime.   . mometasone-formoterol (DULERA) 200-5 MCG/ACT AERO Inhale 2 puffs into the lungs 2 (two) times daily. (Patient taking differently: Inhale 2 puffs into the lungs daily. )  . omeprazole (PRILOSEC) 20 MG capsule Take 40 mg by mouth daily.   . ondansetron (ZOFRAN ODT) 4 MG disintegrating tablet Take 1 tablet (4 mg total) by mouth every 8 (eight) hours as needed for nausea or vomiting.  . propranolol (INDERAL) 80 MG tablet Take 80 mg by mouth daily.  [name] Respiratory Therapy Supplies (FLUTTER) DEVI Use 10-15 times daily DX: Mild Bronchiectasis DX Code:J47.1  . SUMAtriptan (IMITREX) 25 MG tablet Take 25 mg by mouth every 2 (two) hours as needed for migraine. May repeat in 2 hours if headache persists or recurs.  [name] tiZANidine (ZANAFLEX) 4 MG tablet Take 4 mg by mouth every 8 (eight) hours as needed for muscle spasms.  . traMADol (ULTRAM) 50 MG tablet Take 50 mg by mouth at bedtime.     Allergies: Cherry; Shellfish allergy; Sulfa antibiotics; and Bee venom  Social History   Tobacco Use  . Smoking status: Never Smoker  . Smokeless tobacco: Never Used  Substance Use Topics  . Alcohol use: No    Alcohol/week:  0.0 standard drinks  . Drug use: No    Family History  Problem Relation Age of Onset  . COPD Mother   . High Cholesterol Mother   . Heart failure Mother   . Hypertension Mother   . Hypertension Father   . AAA (abdominal aortic aneurysm) Sister   . COPD Sister   . High Cholesterol Sister     Review of Systems: A 12-system review of systems was performed and was negative except as noted in the HPI.  --------------------------------------------------------------------------------------------------  Physical Exam: BP 128/84 (BP Location: Right Arm, Patient Position: Sitting, Cuff Size: Normal)   Pulse 67   Ht 5\' 2"  (1.575 m)   Wt 188 lb 12 oz (85.6 kg)   LMP 09/24/2016 (Approximate)   BMI 34.52 kg/m   General: NAD. HEENT: No conjunctival pallor or scleral icterus. Moist mucous membranes. OP clear. Neck: Supple without lymphadenopathy, thyromegaly, JVD, or HJR. No carotid bruit. Lungs: Normal work of breathing. Clear to auscultation bilaterally without wheezes or crackles. Heart: Regular rate and rhythm without murmurs, rubs, or gallops. Non-displaced PMI. Abd: Bowel sounds present. Soft, NT/ND  without hepatosplenomegaly Ext: No lower extremity edema. Radial, PT, and DP pulses are 2+ bilaterally Skin: Warm and dry without rash. Neuro: CNIII-XII intact. Strength and fine-touch sensation intact in upper and lower extremities bilaterally. Psych: Normal mood and affect.  EKG: Normal sinus rhythm with possible left atrial enlargement and nonspecific ST segment changes.  Lab Results  Component Value Date   WBC 6.6 11/21/2015   HGB 12.9 11/21/2015   HCT 38.6 11/21/2015   MCV 90.1 11/21/2015   PLT 224 11/21/2015    Lab Results  Component Value Date   NA 140 01/25/2016   K 3.9 01/25/2016   CL 104 01/25/2016   CO2 21 (L) 01/25/2016   BUN 13 01/25/2016   CREATININE 0.88 01/25/2016   GLUCOSE 94 01/25/2016   ALT 39 11/21/2015    No results found for: CHOL, HDL, LDLCALC,  LDLDIRECT, TRIG, CHOLHDL   --------------------------------------------------------------------------------------------------  ASSESSMENT AND PLAN: Unstable angina I am concerned that Ms. Huhta's progressive chest pressure and shortness of breath represent unstable angina.  Cardiac risk factors include hypertension and hyperlipidemia.  Some of her symptoms are atypical, including the accompanying vertigo.  EKG today demonstrates nonspecific ST changes.  Unenhanced CT of the chest in 2016 showed no evidence of coronary artery calcification.  Nonetheless, given her progressive symptoms, which are now intermittently present at rest, I have recommended proceeding with cardiac catheterization.  I have reviewed the risks, indications, and alternatives to cardiac catheterization, possible angioplasty, and stenting with the patient. Risks include but are not limited to bleeding, infection, vascular injury, stroke, myocardial infection, arrhythmia, kidney injury, radiation-related injury in the case of prolonged fluoroscopy use, emergency cardiac surgery, and death. The patient understands the risks of serious complication is 1-2 in 4332 with diagnostic cardiac cath and 1-2% or less with angioplasty/stenting.  In the meantime, we will add aspirin 81 mg daily, isosorbide mononitrate 15 mg daily and sublingual nitroglycerin to be taken as needed for chest pain.  I have advised her to seek immediate medical attention if her chest pain recurs and does not resolve promptly with a single sublingual nitroglycerin tablet.  Shortness of breath May be related to coronary insufficiency, though her underlying lung disease may be contributing.  If catheterization is clean, she may benefit from an echocardiogram to exclude other structural abnormalities.  Follow-up: Return to clinic in 3 to 4 weeks.  Nelva Bush, MD 09/16/2018 3:59 PM

## 2018-09-16 NOTE — Patient Instructions (Signed)
Referral has been made to cardiology (Dr. Saunders Revel) to be seen today at 1:20 PM  After Dr. Saunders Revel has seen you and decided on appropriate evaluation from a cardiology point of view, we will schedule follow-up in this office.  Further pulmonary evaluation will likely include chest x-ray and pulmonary function tests  For now continue Dulera inhaler and albuterol as needed

## 2018-09-16 NOTE — Progress Notes (Signed)
PULMONARY OFFICE FOLLOW UP NOTE  PROBLEMS:  Very mild bronchiectasis Lung nodules Recurrent bronchitis   PT PROFILE: 52 y.o. female never smoker, former patient of Dr. Stevenson Clinch who underwent extensive evaluation in 2016 for persistent dyspnea, mucus production.   DATA: 05/19/2015 CT chest: Diffuse bronchial wall thickening and scattered areas of mild cylindrical bronchiectasis, predominantly in the lung bases bilaterally. Two new nonspecific pulmonary nodules in the right lower lobe, largest of which measures only 4 mm 04/25/2015 PFTs: No obstruction, mild restriction, normal DLCO 05/29/2015 bronchoscopy: Copious mucoid secretions throughout. BAL grew Proteus 01/10/17 Spirometry: FVC 3.05(84%), FEV1 2.52 (88%), FEV1/FVC 83%  INTERVAL HISTORY: Last seen 01/28/2017.   SUBJ: She did not follow-up previously due to the fact that her symptoms resolved completely.  However, over the last several months, she has had severe and increasing exertional dyspnea associated with chest pressure.  She describes an exercise limitation of ambulating less than 200 feet before she is out of breath.  Sometimes the chest pressure can occur without exertion.  She describes it as "like an elephant on my chest" she also states that her "lungs feel like they are on fire".  Last month, she was treated with antibiotics and prednisone with no improvement.  She denies sputum production and hemoptysis.  She denies pleuritic chest pain.  She denies lower extremity edema calf tenderness.  She has had no fevers.  OBJ: Vitals:   09/16/18 1005  Weight: 187 lb 9.6 oz (85.1 kg)  Height: 5\' 2"  (1.575 m)  RA  Gen: Moderately obese, NAD HEENT: NCAT, sclera white Neck: No JVD Lungs: breath sounds full, no wheezes or other adventitious sounds Cardiovascular: RRR, no murmurs Abdomen: Soft, nontender, normal BS Ext: without clubbing, cyanosis, edema Neuro: grossly intact Skin: Limited exam, no lesions noted    DATA: BMP  Latest Ref Rng & Units 01/25/2016 11/21/2015 02/06/2015  Glucose 65 - 99 mg/dL 94 111(H) 136(H)  BUN 6 - 20 mg/dL 13 16 14   Creatinine 0.44 - 1.00 mg/dL 0.88 0.73 0.73  Sodium 135 - 145 mmol/L 140 136 136  Potassium 3.5 - 5.1 mmol/L 3.9 3.4(L) 3.1(L)  Chloride 101 - 111 mmol/L 104 102 103  CO2 22 - 32 mmol/L 21(L) 24 22  Calcium 8.9 - 10.3 mg/dL 10.1 9.2 8.9   CBC Latest Ref Rng & Units 11/21/2015 02/06/2015 11/07/2013  WBC 3.6 - 11.0 K/uL 6.6 11.2(H) 8.7  Hemoglobin 12.0 - 16.0 g/dL 12.9 13.0 14.0  Hematocrit 35.0 - 47.0 % 38.6 39.4 41.6  Platelets 150 - 440 K/uL 224 221 268    CXR: No new film  IMPRESSION: Bronchiectasis without complication (HCC)  DOE (dyspnea on exertion)  Chest pressure   Her bronchiectasis is very mild and does not seem to be the principal problem at this time.  Her previous PFTs were entirely normal and her exam presently is likewise normal.  Therefore, I do not think her exertional dyspnea is on the basis of pulmonary disease.  I am concerned about the description of exertional (and sometimes rest) chest pressure.  Overall, she gives a fairly good description for accelerating/unstable angina.   PLAN: I spoke with Dr. Saunders Revel who is scheduled to see her in December.  I shared her history with him.  He concurs that she probably warrants more urgent cardiac evaluation.  He will see her in the office later this afternoon.  I have not made any changes in her respiratory medicines (Dulera, albuterol as needed).  After cardiology evaluation, we will  arrange for further pulmonary evaluation as indicated.  The patient and her husband understand my concern.  They also understand that her symptoms might not be related to cardiac disease at all.  However, this is the most urgent matter to rule out.  If her cardiac evaluation is unrevealing, we will obtain a chest x-ray and repeat pulmonary function tests with follow-up to be scheduled within the next couple of weeks.  Merton Border, MD PCCM service Mobile [phone] Pager [phone] 09/16/2018 10:17 AM

## 2018-09-16 NOTE — Patient Instructions (Addendum)
Medication Instructions:  Your physician has recommended you make the following change in your medication:  1- START Aspirin 81 mg by mouth once a day. 2- START Imdur 15 mg (0.5 tablet) by mouth once a day. 3- Nitroglycerin 0.4 mg - Dissolve 1 tablet under the tongue every 5 minutes AS NEEDED for chest pain, maximum of 3 doses.   If you need a refill on your cardiac medications before your next appointment, please call your pharmacy.   Lab work: Your physician recommends that you return for lab work in: TODAY - [place], BMET.  If you have labs (blood work) drawn today and your tests are completely normal, you will receive your results only by: [name] MyChart Message (if you have MyChart) OR . A paper copy in the mail If you have any lab test that is abnormal or we need to change your treatment, we will call you to review the results.  Testing/Procedures: Your physician has requested that you have a LEFT cardiac catheterization. Cardiac catheterization is used to diagnose and/or treat various heart conditions. Doctors may recommend this procedure for a number of different reasons. The most common reason is to evaluate chest pain. Chest pain can be a symptom of coronary artery disease (CAD), and cardiac catheterization can show whether plaque is narrowing or blocking your heart's arteries. This procedure is also used to evaluate the valves, as well as measure the blood flow and oxygen levels in different parts of your heart. For further information please visit [email or web address]. Please follow instruction sheet, as given.  [facility] Cardiac Cath Instructions   You are scheduled for a Cardiac Cath on:___11/12/19____  Please arrive at _06:30__am on the day of your procedure  Please expect a call from our [place] to pre-register you  Do not eat/drink anything after midnight  Someone will need to drive you home  It is recommended someone be with you for the first 24 hours after  your procedure  Wear clothes that are easy to get on/off and wear slip on shoes if possible   Medications bring a current list of all medications with you  _XX_ You may take all of your medications the morning of your procedure with enough water to swallow safely   Day of your procedure: Arrive at the [place] entrance.  Free valet service is available.  After entering the [place] please check-in at the registration desk (1st desk on your right) to receive your armband. After receiving your armband someone will escort you to the cardiac cath/special procedures waiting area.  The usual length of stay after your procedure is about 2 to 3 hours.  This can vary.  If you have any questions, please call our office at [phone], or you may call the cardiac cath lab at [facility] directly at [phone]   Follow-Up: At [facility], you and your health needs are our priority.  As part of our continuing mission to provide you with exceptional heart care, we have created designated Provider Care Teams.  These Care Teams include your primary Cardiologist (physician) and Advanced Practice Providers (APPs -  Physician Assistants and Nurse Practitioners) who all work together to provide you with the care you need, when you need it. You will need a follow up appointment in 3 weeks.  Please call our office 2 months in advance to schedule this appointment.  You may see DR Harrell Gave END or one of the following Advanced Practice Providers on your designated Care Team:  Murray Hodgkins, NP Christell Faith, PA-C . Marrianne Mood, PA-C     Coronary Angiogram With Stent Coronary angiogram with stent placement is a procedure to widen or open a narrow blood vessel of the heart (coronary artery). Arteries may become blocked by cholesterol buildup (plaques) in the lining of the wall. When a coronary artery becomes partially blocked, blood flow to that area decreases. This may lead to chest pain or a  heart attack (myocardial infarction). A stent is a small piece of metal that looks like mesh or a spring. Stent placement may be done as treatment for a heart attack or right after a coronary angiogram in which a blocked artery is found. Let your health care provider know about:  Any allergies you have.  All medicines you are taking, including vitamins, herbs, eye drops, creams, and over-the-counter medicines.  Any problems you or family members have had with anesthetic medicines.  Any blood disorders you have.  Any surgeries you have had.  Any medical conditions you have.  Whether you are pregnant or may be pregnant. What are the risks? Generally, this is a safe procedure. However, problems may occur, including:  Damage to the heart or its blood vessels.  A return of blockage.  Bleeding, infection, or bruising at the insertion site.  A collection of blood under the skin (hematoma) at the insertion site.  A blood clot in another part of the body.  Kidney injury.  Allergic reaction to the dye or contrast that is used.  Bleeding into the abdomen (retroperitoneal bleeding).  What happens before the procedure? Staying hydrated Follow instructions from your health care provider about hydration, which may include:  Up to 2 hours before the procedure - you may continue to drink clear liquids, such as water, clear fruit juice, black coffee, and plain tea.  Eating and drinking restrictions Follow instructions from your health care provider about eating and drinking, which may include:  8 hours before the procedure - stop eating heavy meals or foods such as meat, fried foods, or fatty foods.  6 hours before the procedure - stop eating light meals or foods, such as toast or cereal.  2 hours before the procedure - stop drinking clear liquids.  Ask your health care provider about:  Changing or stopping your regular medicines. This is especially important if you are taking  diabetes medicines or blood thinners.  Taking medicines such as ibuprofen. These medicines can thin your blood. Do not take these medicines before your procedure if your health care provider instructs you not to. Generally, aspirin is recommended before a procedure of passing a small, thin tube (catheter) through a blood vessel and into the heart (cardiac catheterization).  What happens during the procedure?  An IV tube will be inserted into one of your veins.  You will be given one or more of the following: ? A medicine to help you relax (sedative). ? A medicine to numb the area where the catheter will be inserted into an artery (local anesthetic).  To reduce your risk of infection: ? Your health care team will wash or sanitize their hands. ? Your skin will be washed with soap. ? Hair may be removed from the area where the catheter will be inserted.  Using a guide wire, the catheter will be inserted into an artery. The location may be in your groin, in your wrist, or in the fold of your arm (near your elbow).  A type of X-ray (fluoroscopy) will be used  to help guide the catheter to the opening of the arteries in the heart.  A dye will be injected into the catheter, and X-rays will be taken. The dye will help to show where any narrowing or blockages are located in the arteries.  A tiny wire will be guided to the blocked spot, and a balloon will be inflated to make the artery wider.  The stent will be expanded and will crush the plaques into the wall of the vessel. The stent will hold the area open and improve the blood flow. Most stents have a drug coating to reduce the risk of the stent narrowing over time.  The artery may be made wider using a drill, laser, or other tools to remove plaques.  When the blood flow is better, the catheter will be removed. The lining of the artery will grow over the stent, which stays where it was placed. This procedure may vary among health care providers  and hospitals. What happens after the procedure?  If the procedure is done through the leg, you will be kept in bed lying flat for about 6 hours. You will be instructed to not bend and not cross your legs.  The insertion site will be checked frequently.  The pulse in your foot or wrist will be checked frequently.  You may have additional blood tests, X-rays, and a test that records the electrical activity of your heart (electrocardiogram, or ECG). This information is not intended to replace advice given to you by your health care provider. Make sure you discuss any questions you have with your health care provider. Document Released: 05/04/2003 Document Revised: 06/27/2016 Document Reviewed: 06/02/2016 Elsevier Interactive Patient Education  [employer].

## 2018-09-16 NOTE — Progress Notes (Addendum)
New Outpatient Visit Date: 09/16/2018  Referring Provider: Cristy Folks, PA-C [place], [place] [address]  Chief Complaint: Shortness of breath and chest pain  HPI:  Melinda Shaw is a 52 y.o. female who is being seen today for the evaluation of shortness of breath and chest pain at the request of Melinda Shaw. She has a history of hypertension, hyperlipidemia, bronchiectasis, fibromyalgia, migraine headaches, and anxiety/depression.  She reports several months of progressive shortness of breath and chest discomfort, for which she has been referred to [place] and pulmonology.  She was evaluated by Dr. Stevenson Clinch several years ago due to bronchiectasis and shortness of breath.  She was seen by Dr. Alva Garnet earlier today and reported symptoms concerning for unstable angina.  Her appointment was therefore moved up to today.  Melinda Shaw reports increasing chest pressure that began about a month ago.  She feels heaviness across her precordium extending to the upper abdomen.  It can happen even at rest but seems to be worsened with activity.  She notes accompanying shortness of breath as well as nausea and dizziness.  The pain typically lasts 20 to 30 minutes and does not radiate.  She sometimes feels like the room is spinning around her.  She also notes a syncopal episode in July.  She was standing her porch and suddenly fell, landing in a flower bed.  She does not know how long she was out for.  There were no prodromal symptoms.  She has not had any palpitations.  [name] denies a history of prior cardiac disease or ischemia testing.  She was recently treated for bronchitis with prednisone and 2 courses of azithromycin.  She has also been started on ondansetron for her nausea with some improvement.  She has chronic intermittent migraines for which she is on prophylactic treatment with propranolol as well as as needed therapy with  Imitrex.  --------------------------------------------------------------------------------------------------  Cardiovascular History & Procedures: Cardiovascular Problems:  Chest pain  Shortness of breath and history of bronchiectasis  Risk Factors:  Hypertension, hyperlipidemia, and obesity  Cath/PCI:  None  CV Surgery:  None  EP Procedures and Devices:  None  Non-Invasive Evaluation(s):  TTE (04/17/2015): Normal LV size and wall thickness.  LVEF 60 to 65% with normal wall motion and diastolic function.  Normal RV size and function.  No significant valvular abnormalities.  Recent CV Pertinent Labs: Lab Results  Component Value Date   BNP 10 02/06/2015   K 3.9 01/25/2016   K 3.1 (L) 02/06/2015   BUN 13 01/25/2016   BUN 14 02/06/2015   CREATININE 0.88 01/25/2016   CREATININE 0.73 02/06/2015    --------------------------------------------------------------------------------------------------  Past Medical History:  Diagnosis Date  . Anemia    H/O  . Anxiety   . Bronchitis   . Depression   . Fibromyalgia   . GERD (gastroesophageal reflux disease)   . Headache   . Hyperlipidemia   . Hypertension    no meds  . PONV (postoperative nausea and vomiting)   . Shortness of breath dyspnea     Past Surgical History:  Procedure Laterality Date  . ANTERIOR CRUCIATE LIGAMENT REPAIR    . CHOLECYSTECTOMY    . ESOPHAGOGASTRODUODENOSCOPY (EGD) WITH PROPOFOL N/A 08/28/2015   Procedure: ESOPHAGOGASTRODUODENOSCOPY (EGD) WITH PROPOFOL;  Surgeon: Josefine Class, MD;  Location: [facility] ENDOSCOPY;  Service: Endoscopy;  Laterality: N/A;  . KNEE SURGERY     left knee   . MASS EXCISION Left 01/26/2016   Procedure: EXCISION  OF LEFT WRIST VOLAR MASS;  Surgeon: Charlotte Crumb, MD;  Location: [facility];  Service: Orthopedics;  Laterality: Left;  . NOSE SURGERY    . POLYPECTOMY    . TUBAL LIGATION    . VIDEO BRONCHOSCOPY N/A 05/29/2015   Procedure: VIDEO  BRONCHOSCOPY WITHOUT FLUORO;  Surgeon: Vilinda Boehringer, MD;  Location: [facility] ORS;  Service: Cardiopulmonary;  Laterality: N/A;    Current Meds  Medication Sig  . albuterol (PROVENTIL HFA;VENTOLIN HFA) 108 (90 BASE) MCG/ACT inhaler Inhale 2 puffs into the lungs every 6 (six) hours as needed for wheezing or shortness of breath.  . citalopram (CELEXA) 20 MG tablet Take 20 mg by mouth every morning.   . cyclobenzaprine (FLEXERIL) 10 MG tablet Take 1 tablet (10 mg total) by mouth 3 (three) times daily as needed for muscle spasms.  [name] eletriptan (RELPAX) 20 MG tablet Take 20 mg by mouth every 2 (two) hours as needed.   [name] EPINEPHrine (EPIPEN 2-PAK) 0.3 mg/0.3 mL IJ SOAJ injection Inject 0.3 mLs (0.3 mg total) into the muscle once.  . gabapentin (NEURONTIN) 100 MG capsule Take 200 mg by mouth at bedtime.   . mometasone-formoterol (DULERA) 200-5 MCG/ACT AERO Inhale 2 puffs into the lungs 2 (two) times daily. (Patient taking differently: Inhale 2 puffs into the lungs daily. )  . omeprazole (PRILOSEC) 20 MG capsule Take 40 mg by mouth daily.   . ondansetron (ZOFRAN ODT) 4 MG disintegrating tablet Take 1 tablet (4 mg total) by mouth every 8 (eight) hours as needed for nausea or vomiting.  . propranolol (INDERAL) 80 MG tablet Take 80 mg by mouth daily.  [name] Respiratory Therapy Supplies (FLUTTER) DEVI Use 10-15 times daily DX: Mild Bronchiectasis DX Code:J47.1  . SUMAtriptan (IMITREX) 25 MG tablet Take 25 mg by mouth every 2 (two) hours as needed for migraine. May repeat in 2 hours if headache persists or recurs.  [name] tiZANidine (ZANAFLEX) 4 MG tablet Take 4 mg by mouth every 8 (eight) hours as needed for muscle spasms.  . traMADol (ULTRAM) 50 MG tablet Take 50 mg by mouth at bedtime.     Allergies: Cherry; Shellfish allergy; Sulfa antibiotics; and Bee venom  Social History   Tobacco Use  . Smoking status: Never Smoker  . Smokeless tobacco: Never Used  Substance Use Topics  . Alcohol use: No    Alcohol/week:  0.0 standard drinks  . Drug use: No    Family History  Problem Relation Age of Onset  . COPD Mother   . High Cholesterol Mother   . Heart failure Mother   . Hypertension Mother   . Hypertension Father   . AAA (abdominal aortic aneurysm) Sister   . COPD Sister   . High Cholesterol Sister     Review of Systems: A 12-system review of systems was performed and was negative except as noted in the HPI.  --------------------------------------------------------------------------------------------------  Physical Exam: BP 128/84 (BP Location: Right Arm, Patient Position: Sitting, Cuff Size: Normal)   Pulse 67   Ht 5\' 2"  (1.575 m)   Wt 188 lb 12 oz (85.6 kg)   LMP 09/24/2016 (Approximate)   BMI 34.52 kg/m   General: NAD. HEENT: No conjunctival pallor or scleral icterus. Moist mucous membranes. OP clear. Neck: Supple without lymphadenopathy, thyromegaly, JVD, or HJR. No carotid bruit. Lungs: Normal work of breathing. Clear to auscultation bilaterally without wheezes or crackles. Heart: Regular rate and rhythm without murmurs, rubs, or gallops. Non-displaced PMI. Abd: Bowel sounds present. Soft, NT/ND  without hepatosplenomegaly Ext: No lower extremity edema. Radial, PT, and DP pulses are 2+ bilaterally Skin: Warm and dry without rash. Neuro: CNIII-XII intact. Strength and fine-touch sensation intact in upper and lower extremities bilaterally. Psych: Normal mood and affect.  EKG: Normal sinus rhythm with possible left atrial enlargement and nonspecific ST segment changes.  Lab Results  Component Value Date   WBC 6.6 11/21/2015   HGB 12.9 11/21/2015   HCT 38.6 11/21/2015   MCV 90.1 11/21/2015   PLT 224 11/21/2015    Lab Results  Component Value Date   NA 140 01/25/2016   K 3.9 01/25/2016   CL 104 01/25/2016   CO2 21 (L) 01/25/2016   BUN 13 01/25/2016   CREATININE 0.88 01/25/2016   GLUCOSE 94 01/25/2016   ALT 39 11/21/2015    No results found for: CHOL, HDL, LDLCALC,  LDLDIRECT, TRIG, CHOLHDL   --------------------------------------------------------------------------------------------------  ASSESSMENT AND PLAN: Unstable angina I am concerned that Ms. Warga's progressive chest pressure and shortness of breath represent unstable angina.  Cardiac risk factors include hypertension and hyperlipidemia.  Some of her symptoms are atypical, including the accompanying vertigo.  EKG today demonstrates nonspecific ST changes.  Unenhanced CT of the chest in 2016 showed no evidence of coronary artery calcification.  Nonetheless, given her progressive symptoms, which are now intermittently present at rest, I have recommended proceeding with cardiac catheterization.  I have reviewed the risks, indications, and alternatives to cardiac catheterization, possible angioplasty, and stenting with the patient. Risks include but are not limited to bleeding, infection, vascular injury, stroke, myocardial infection, arrhythmia, kidney injury, radiation-related injury in the case of prolonged fluoroscopy use, emergency cardiac surgery, and death. The patient understands the risks of serious complication is 1-2 in [id number] with diagnostic cardiac cath and 1-2% or less with angioplasty/stenting.  In the meantime, we will add aspirin 81 mg daily, isosorbide mononitrate 15 mg daily and sublingual nitroglycerin to be taken as needed for chest pain.  I have advised her to seek immediate medical attention if her chest pain recurs and does not resolve promptly with a single sublingual nitroglycerin tablet.  Shortness of breath May be related to coronary insufficiency, though her underlying lung disease may be contributing.  If catheterization is clean, she may benefit from an echocardiogram to exclude other structural abnormalities.  Follow-up: Return to clinic in 3 to 4 weeks.  Nelva Bush, MD 09/16/2018 3:59 PM

## 2018-09-17 LAB — CBC WITH DIFFERENTIAL/PLATELET
BASOS ABS: 0.1 10*3/uL (ref 0.0–0.2)
Basos: 1 %
EOS (ABSOLUTE): 0.3 10*3/uL (ref 0.0–0.4)
Eos: 5 %
HEMOGLOBIN: 12.8 g/dL (ref 11.1–15.9)
Hematocrit: 39.2 % (ref 34.0–46.6)
Immature Grans (Abs): 0 10*3/uL (ref 0.0–0.1)
Immature Granulocytes: 0 %
LYMPHS ABS: 2.5 10*3/uL (ref 0.7–3.1)
Lymphs: 34 %
MCH: 29.6 pg (ref 26.6–33.0)
MCHC: 32.7 g/dL (ref 31.5–35.7)
MCV: 91 fL (ref 79–97)
MONOCYTES: 6 %
Monocytes Absolute: 0.4 10*3/uL (ref 0.1–0.9)
NEUTROS ABS: 4 10*3/uL (ref 1.4–7.0)
Neutrophils: 54 %
PLATELETS: 313 10*3/uL (ref 150–450)
RBC: 4.33 x10E6/uL (ref 3.77–5.28)
RDW: 12.4 % (ref 12.3–15.4)
WBC: 7.3 10*3/uL (ref 3.4–10.8)

## 2018-09-17 LAB — BASIC METABOLIC PANEL
BUN/Creatinine Ratio: 8 — ABNORMAL LOW (ref 9–23)
BUN: 7 mg/dL (ref 6–24)
CALCIUM: 9.5 mg/dL (ref 8.7–10.2)
CHLORIDE: 102 mmol/L (ref 96–106)
CO2: 23 mmol/L (ref 20–29)
CREATININE: 0.87 mg/dL (ref 0.57–1.00)
GFR, EST AFRICAN AMERICAN: 89 mL/min/{1.73_m2} (ref >59)
GFR, EST NON AFRICAN AMERICAN: 77 mL/min/{1.73_m2} (ref >59)
Glucose: 112 mg/dL — ABNORMAL HIGH (ref 65–99)
Potassium: 4.3 mmol/L (ref 3.5–5.2)
Sodium: 140 mmol/L (ref 134–144)

## 2018-09-22 ENCOUNTER — Other Ambulatory Visit: Payer: Self-pay

## 2018-09-22 ENCOUNTER — Encounter
Admission: RE | Disposition: A | Discharge status: Home / Self Care | Payer: Self-pay | Source: Ambulatory Visit | Attending: Internal Medicine

## 2018-09-22 ENCOUNTER — Ambulatory Visit
Admission: RE | Admit: 2018-09-22 | Discharge: 2018-09-22 | Disposition: A | Discharge status: Home / Self Care | Payer: PPO | Source: Ambulatory Visit | Attending: Internal Medicine | Admitting: Internal Medicine

## 2018-09-22 DIAGNOSIS — Z9851 Tubal ligation status: Secondary | ICD-10-CM | POA: Diagnosis not present

## 2018-09-22 DIAGNOSIS — Z79899 Other long term (current) drug therapy: Secondary | ICD-10-CM | POA: Diagnosis not present

## 2018-09-22 DIAGNOSIS — I10 Essential (primary) hypertension: Secondary | ICD-10-CM | POA: Insufficient documentation

## 2018-09-22 DIAGNOSIS — E785 Hyperlipidemia, unspecified: Secondary | ICD-10-CM | POA: Insufficient documentation

## 2018-09-22 DIAGNOSIS — I200 Unstable angina: Secondary | ICD-10-CM | POA: Insufficient documentation

## 2018-09-22 DIAGNOSIS — Z9889 Other specified postprocedural states: Secondary | ICD-10-CM | POA: Diagnosis not present

## 2018-09-22 DIAGNOSIS — Z8249 Family history of ischemic heart disease and other diseases of the circulatory system: Secondary | ICD-10-CM | POA: Insufficient documentation

## 2018-09-22 DIAGNOSIS — F329 Major depressive disorder, single episode, unspecified: Secondary | ICD-10-CM | POA: Diagnosis not present

## 2018-09-22 DIAGNOSIS — Z91018 Allergy to other foods: Secondary | ICD-10-CM | POA: Diagnosis not present

## 2018-09-22 DIAGNOSIS — Z9049 Acquired absence of other specified parts of digestive tract: Secondary | ICD-10-CM | POA: Insufficient documentation

## 2018-09-22 DIAGNOSIS — Z882 Allergy status to sulfonamides status: Secondary | ICD-10-CM | POA: Diagnosis not present

## 2018-09-22 DIAGNOSIS — Z91013 Allergy to seafood: Secondary | ICD-10-CM | POA: Diagnosis not present

## 2018-09-22 DIAGNOSIS — M797 Fibromyalgia: Secondary | ICD-10-CM | POA: Diagnosis not present

## 2018-09-22 DIAGNOSIS — R0602 Shortness of breath: Secondary | ICD-10-CM

## 2018-09-22 DIAGNOSIS — E669 Obesity, unspecified: Secondary | ICD-10-CM | POA: Insufficient documentation

## 2018-09-22 DIAGNOSIS — K219 Gastro-esophageal reflux disease without esophagitis: Secondary | ICD-10-CM | POA: Insufficient documentation

## 2018-09-22 DIAGNOSIS — F419 Anxiety disorder, unspecified: Secondary | ICD-10-CM | POA: Diagnosis not present

## 2018-09-22 DIAGNOSIS — R0600 Dyspnea, unspecified: Secondary | ICD-10-CM

## 2018-09-22 DIAGNOSIS — R079 Chest pain, unspecified: Secondary | ICD-10-CM

## 2018-09-22 DIAGNOSIS — Z91030 Bee allergy status: Secondary | ICD-10-CM | POA: Diagnosis not present

## 2018-09-22 DIAGNOSIS — R51 Headache: Secondary | ICD-10-CM | POA: Diagnosis not present

## 2018-09-22 DIAGNOSIS — Z6834 Body mass index (BMI) 34.0-34.9, adult: Secondary | ICD-10-CM | POA: Insufficient documentation

## 2018-09-22 HISTORY — PX: LEFT HEART CATH AND CORONARY ANGIOGRAPHY: CATH118249

## 2018-09-22 SURGERY — LEFT HEART CATH AND CORONARY ANGIOGRAPHY
Anesthesia: Moderate Sedation | Laterality: Left

## 2018-09-22 MED ORDER — MIDAZOLAM HCL 2 MG/2ML IJ SOLN
INTRAMUSCULAR | Status: DC | PRN
  Administered 2018-09-22 (×2): 1 mg via INTRAVENOUS

## 2018-09-22 MED ORDER — HEPARIN (PORCINE) IN NACL 1000-0.9 UT/500ML-% IV SOLN
INTRAVENOUS | Status: AC
  Filled 2018-09-22: qty 1000

## 2018-09-22 MED ORDER — MIDAZOLAM HCL 2 MG/2ML IJ SOLN
INTRAMUSCULAR | Status: AC
  Filled 2018-09-22: qty 2

## 2018-09-22 MED ORDER — SODIUM CHLORIDE 0.9% FLUSH: 3.0000 mL | Freq: Two times a day (BID) | INTRAVENOUS | Status: DC

## 2018-09-22 MED ORDER — SODIUM CHLORIDE 0.9 % IV SOLN: 250.0000 mL | INTRAVENOUS | Status: DC | PRN

## 2018-09-22 MED ORDER — SODIUM CHLORIDE 0.9 % WEIGHT BASED INFUSION: 1.0000 mL/kg/h | INTRAVENOUS | Status: DC

## 2018-09-22 MED ORDER — VERAPAMIL HCL 2.5 MG/ML IV SOLN
INTRAVENOUS | Status: DC | PRN
  Administered 2018-09-22: 2.5 mg via INTRA_ARTERIAL

## 2018-09-22 MED ORDER — SODIUM CHLORIDE 0.9 % WEIGHT BASED INFUSION
3.0000 mL/kg/h | INTRAVENOUS | Status: AC
  Administered 2018-09-22: 3 mL/kg/h via INTRAVENOUS

## 2018-09-22 MED ORDER — ONDANSETRON HCL 4 MG/2ML IJ SOLN: 4.0000 mg | Freq: Four times a day (QID) | INTRAMUSCULAR | Status: DC | PRN

## 2018-09-22 MED ORDER — SODIUM CHLORIDE 0.9 % IV SOLN: INTRAVENOUS | Status: DC

## 2018-09-22 MED ORDER — ACETAMINOPHEN 325 MG PO TABS: 650.0000 mg | ORAL_TABLET | ORAL | Status: DC | PRN

## 2018-09-22 MED ORDER — FENTANYL CITRATE (PF) 100 MCG/2ML IJ SOLN
INTRAMUSCULAR | Status: DC | PRN
  Administered 2018-09-22: 50 ug via INTRAVENOUS

## 2018-09-22 MED ORDER — SODIUM CHLORIDE 0.9% FLUSH: 3.0000 mL | INTRAVENOUS | Status: DC | PRN

## 2018-09-22 MED ORDER — VERAPAMIL HCL 2.5 MG/ML IV SOLN
INTRAVENOUS | Status: AC
  Filled 2018-09-22: qty 2

## 2018-09-22 MED ORDER — HEPARIN SODIUM (PORCINE) 1000 UNIT/ML IJ SOLN
INTRAMUSCULAR | Status: DC | PRN
  Administered 2018-09-22: 4500 [IU] via INTRAVENOUS

## 2018-09-22 MED ORDER — HEPARIN SODIUM (PORCINE) 1000 UNIT/ML IJ SOLN
INTRAMUSCULAR | Status: AC
  Filled 2018-09-22: qty 1

## 2018-09-22 MED ORDER — IOPAMIDOL (ISOVUE-300) INJECTION 61%
INTRAVENOUS | Status: DC | PRN
  Administered 2018-09-22: 65 mL via INTRA_ARTERIAL

## 2018-09-22 MED ORDER — ASPIRIN 81 MG PO CHEW: 81.0000 mg | CHEWABLE_TABLET | ORAL | Status: DC

## 2018-09-22 MED ORDER — FENTANYL CITRATE (PF) 100 MCG/2ML IJ SOLN
INTRAMUSCULAR | Status: AC
  Filled 2018-09-22: qty 2

## 2018-09-22 SURGICAL SUPPLY — 7 items
CATH INFINITI 5FR ANG PIGTAIL (CATHETERS) ×3 IMPLANT
CATH INFINITI 5FR TG (CATHETERS) ×3 IMPLANT
DEVICE RAD TR BAND REGULAR (VASCULAR PRODUCTS) ×3 IMPLANT
GLIDESHEATH SLEND SS 6F .021 (SHEATH) ×3 IMPLANT
KIT MANI 3VAL PERCEP (MISCELLANEOUS) ×3 IMPLANT
PACK CARDIAC CATH (CUSTOM PROCEDURE TRAY) ×3 IMPLANT
WIRE ROSEN-J .035X260CM (WIRE) ×3 IMPLANT

## 2018-09-22 NOTE — Interval H&P Note (Signed)
History and Physical Interval Note:  09/22/2018 7:08 AM  Melinda Shaw  has presented today for cardiac catheterization, with the diagnosis of unstable angina. The various methods of treatment have been discussed with the patient and family. After consideration of risks, benefits and other options for treatment, the patient has consented to  Procedure(s): LEFT HEART CATH AND CORONARY ANGIOGRAPHY (Left) as a surgical intervention .  The patient's history has been reviewed, patient examined, no change in status, stable for surgery.  I have reviewed the patient's chart and labs.  Questions were answered to the patient's satisfaction.    Cath Lab Visit (complete for each Cath Lab visit)  Clinical Evaluation Leading to the Procedure:   ACS: No.  Non-ACS:    Anginal Classification: CCS IV  Anti-ischemic medical therapy: Maximal Therapy (2 or more classes of medications)  Non-Invasive Test Results: No non-invasive testing performed  Prior CABG: No previous CABG  Melinda Shaw

## 2018-09-22 NOTE — Discharge Instructions (Signed)
Groin Insertion Instructions-If you lose feeling or develop tingling or pain in your leg or foot after the procedure, please walk around first.  If the discomfort does not improve , contact your physician and proceed to the nearest emergency room.  Loss of feeling in your leg might mean that a blockage has formed in the artery and this can be appropriately treated.  Limit your activity for the next two days after your procedure.  Avoid stooping, bending, heavy lifting or exertion as this may put pressure on the insertion site.  Resume normal activities in 48 hours.  You may shower after 24 hours but avoid excessive warm water and do not scrub the site.  Remove clear dressing in 48 hours.  If you have had a closure device inserted, do not soak in a tub bath or a hot tub for at least one week.  No driving for 48 hours after discharge.  After the procedure, check the insertion site occasionally.  If any oozing occurs or there is apparent swelling, firm pressure over the site will prevent a bruise from forming.  You can not hurt anything by pressing directly on the site.  The pressure stops the bleeding by allowing a small clot to form.  If the bleeding continues after the pressure has been applied for more than 15 minutes, call 911 or go to the nearest emergency room.    The x-ray dye causes you to pass a considerate amount of urine.  For this reason, you will be asked to drink plenty of liquids after the procedure to prevent dehydration.  You may resume you regular diet.  Avoid caffeine products.    For pain at the site of your procedure, take non-aspirin medicines such as Tylenol.  Medications: A. Hold Metformin for 48 hours if applicable.  B. Continue taking all your present medications at home unless your doctor prescribes any changes.Moderate Conscious Sedation, Adult, Care After These instructions provide you with information about caring for yourself after your procedure. Your health care provider may  also give you more specific instructions. Your treatment has been planned according to current medical practices, but problems sometimes occur. Call your health care provider if you have any problems or questions after your procedure. What can I expect after the procedure? After your procedure, it is common:  To feel sleepy for several hours.  To feel clumsy and have poor balance for several hours.  To have poor judgment for several hours.  To vomit if you eat too soon.  Follow these instructions at home: For at least 24 hours after the procedure:   Do not: ? Participate in activities where you could fall or become injured. ? Drive. ? Use heavy machinery. ? Drink alcohol. ? Take sleeping pills or medicines that cause drowsiness. ? Make important decisions or sign legal documents. ? Take care of children on your own.  Rest. Eating and drinking  Follow the diet recommended by your health care provider.  If you vomit: ? Drink water, juice, or soup when you can drink without vomiting. ? Make sure you have little or no nausea before eating solid foods. General instructions  Have a responsible adult stay with you until you are awake and alert.  Take over-the-counter and prescription medicines only as told by your health care provider.  If you smoke, do not smoke without supervision.  Keep all follow-up visits as told by your health care provider. This is important. Contact a health care provider if:  You keep feeling nauseous or you keep vomiting.  You feel light-headed.  You develop a rash.  You have a fever. Get help right away if:  You have trouble breathing. This information is not intended to replace advice given to you by your health care provider. Make sure you discuss any questions you have with your health care provider. Document Released: 08/18/2013 Document Revised: 04/01/2016 Document Reviewed: 02/17/2016 Elsevier Interactive Patient Education  2018  [place] After This sheet gives you information about how to care for yourself after your procedure. Your health care provider may also give you more specific instructions. If you have problems or questions, contact your health care provider. What can I expect after the procedure? After the procedure, it is common to have bruising and tenderness at the catheter insertion area. Follow these instructions at home: Insertion site care  Follow instructions from your health care provider about how to take care of your insertion site. Make sure you: ? Wash your hands with soap and water before you change your bandage (dressing). If soap and water are not available, use hand sanitizer. ? Change your dressing as told by your health care provider. ? Leave stitches (sutures), skin glue, or adhesive strips in place. These skin closures may need to stay in place for 2 weeks or longer. If adhesive strip edges start to loosen and curl up, you may trim the loose edges. Do not remove adhesive strips completely unless your health care provider tells you to do that.  Do not take baths, swim, or use a hot tub until your health care provider approves.  You may shower 24-48 hours after the procedure or as told by your health care provider. ? Gently wash the site with plain soap and water. ? Pat the area dry with a clean towel. ? Do not rub the site. This may cause bleeding.  Do not apply powder or lotion to the site. Keep the site clean and dry.  Check your insertion site every day for signs of infection. Check for: ? Redness, swelling, or pain. ? Fluid or blood. ? Warmth. ? Pus or a bad smell. Activity  Rest as told by your health care provider, usually for 1-2 days.  Do not lift anything that is heavier than 10 lbs. (4.5 kg) or as told by your health care provider.  Do not drive for 24 hours if you were given a medicine to help you relax (sedative).  Do not drive or use heavy  machinery while taking prescription pain medicine. General instructions  Return to your normal activities as told by your health care provider, usually in about a week. Ask your health care provider what activities are safe for you.  If the catheter site starts bleeding, lie flat and put pressure on the site. If the bleeding does not stop, get help right away. This is a medical emergency.  Drink enough fluid to keep your urine clear or pale yellow. This helps flush the contrast dye from your body.  Take over-the-counter and prescription medicines only as told by your health care provider.  Keep all follow-up visits as told by your health care provider. This is important. Contact a health care provider if:  You have a fever or chills.  You have redness, swelling, or pain around your insertion site.  You have fluid or blood coming from your insertion site.  The insertion site feels warm to the touch.  You have pus or a bad smell  coming from your insertion site.  You have bruising around the insertion site.  You notice blood collecting in the tissue around the catheter site (hematoma). The hematoma may be painful to the touch. Get help right away if:  You have severe pain at the catheter insertion area.  The catheter insertion area swells very fast.  The catheter insertion area is bleeding, and the bleeding does not stop when you hold steady pressure on the area.  The area near or just beyond the catheter insertion site becomes pale, cool, tingly, or numb. These symptoms may represent a serious problem that is an emergency. Do not wait to see if the symptoms will go away. Get medical help right away. Call your local emergency services (911 in the [place] Do not drive yourself to the hospital. Summary  After the procedure, it is common to have bruising and tenderness at the catheter insertion area.  After the procedure, it is important to rest and drink plenty of fluids.  Do not  take baths, swim, or use a hot tub until your health care provider says it is okay to do so. You may shower 24-48 hours after the procedure or as told by your health care provider.  If the catheter site starts bleeding, lie flat and put pressure on the site. If the bleeding does not stop, get help right away. This is a medical emergency. This information is not intended to replace advice given to you by your health care provider. Make sure you discuss any questions you have with your health care provider. Document Released: 05/16/2005 Document Revised: 10/02/2016 Document Reviewed: 10/02/2016 Elsevier Interactive Patient Education  [employer].

## 2018-09-29 ENCOUNTER — Ambulatory Visit
Admission: RE | Admit: 2018-09-29 | Discharge: 2018-09-29 | Disposition: A | Discharge status: Home / Self Care | Payer: PPO | Source: Ambulatory Visit | Attending: Pulmonary Disease | Admitting: Pulmonary Disease

## 2018-09-29 ENCOUNTER — Ambulatory Visit (HOSPITAL_COMMUNITY): Payer: PPO

## 2018-09-29 DIAGNOSIS — R0600 Dyspnea, unspecified: Secondary | ICD-10-CM | POA: Insufficient documentation

## 2018-09-29 DIAGNOSIS — R0602 Shortness of breath: Secondary | ICD-10-CM | POA: Diagnosis not present

## 2018-09-29 MED ORDER — ALBUTEROL SULFATE (2.5 MG/3ML) 0.083% IN NEBU
2.5000 mg | INHALATION_SOLUTION | Freq: Once | RESPIRATORY_TRACT | Status: AC
  Administered 2018-09-29: 2.5 mg via RESPIRATORY_TRACT
  Filled 2018-09-29: qty 3

## 2018-10-05 ENCOUNTER — Encounter: Payer: Self-pay | Admitting: Pulmonary Disease

## 2018-10-05 ENCOUNTER — Ambulatory Visit (INDEPENDENT_AMBULATORY_CARE_PROVIDER_SITE_OTHER): Payer: PPO | Admitting: Pulmonary Disease

## 2018-10-05 VITALS — BP 126/64 | HR 67 | Resp 16 | Ht 62.0 in | Wt 187.0 lb

## 2018-10-05 DIAGNOSIS — R0609 Other forms of dyspnea: Secondary | ICD-10-CM | POA: Diagnosis not present

## 2018-10-05 DIAGNOSIS — J479 Bronchiectasis, uncomplicated: Secondary | ICD-10-CM

## 2018-10-05 DIAGNOSIS — J4540 Moderate persistent asthma, uncomplicated: Secondary | ICD-10-CM

## 2018-10-05 NOTE — Progress Notes (Addendum)
PULMONARY OFFICE FOLLOW UP NOTE  PROBLEMS:  Very mild bronchiectasis Lung nodules Recurrent bronchitis   PT PROFILE: 52 y.o. female never smoker, former patient of Dr. Stevenson Clinch who underwent extensive evaluation in 2016 for persistent dyspnea, mucus production.   DATA: 05/19/2015 CT chest: Diffuse bronchial wall thickening and scattered areas of mild cylindrical bronchiectasis, predominantly in the lung bases bilaterally. Two new nonspecific pulmonary nodules in the right lower lobe, largest of which measures only 4 mm 04/25/2015 PFTs: No obstruction, mild restriction, normal DLCO 05/29/2015 bronchoscopy: Copious mucoid secretions throughout. BAL grew Proteus 01/10/17 Spirometry: FVC 3.05(84%), FEV1 2.52 (88%), FEV1/FVC 83% 09/22/18 LHC: No angiographically significant coronary artery disease.  Normal left ventricular systolic function and filling pressure.  Recommended to continue medical therapy including propranolol and isosorbide mononitrate in case there is an element of microvascular dysfunction and/or coronary vasospasm. 09/29/18 PFTs: FVC: 2.44 > 2.72 L (77 > 85 %pred), FEV1: 2.00 > 2.34 L (78 > 92 %pred), FEV1/FVC: 82%, TLC: 3.66 L (77 %pred), DLCO 70 %pred, DLCO/VA 100%    INTERVAL HISTORY: Last seen 09/16/2018.  Underwent coronary angiogram as documented above.  SUBJ: This is a scheduled follow-up.  Results of her left heart catheterization noted.  She continues to have moderate to severe exertional dyspnea with chest pressure.  She remains on [facility] inhaler.  She is uncertain whether this offers her any benefit.  She uses her albuterol inhaler which helps "a little".  She is unable to discern any exacerbating factors.  Other than the minimal benefit that she gets from albuterol, she is unable to discern any alleviating factors.  On further questioning regarding her medication usage, she indicates that she is using the [facility] as a rescue inhaler from time to time and is not using it  on a schedule.  OBJ: Vitals:   10/05/18 1041 10/05/18 1055  BP:  126/64  Pulse:  67  Resp: 16   SpO2:  95%  Weight: 187 lb (84.8 kg)   Height: 5\' 2"  (1.575 m)   RA  Gen: NAD HEENT: NCAT, sclera white Neck: No JVD Lungs: breath sounds full, no wheezes or other adventitious sounds Cardiovascular: RRR, no murmurs Abdomen: Soft, nontender, normal BS Ext: without clubbing, cyanosis, edema Neuro: grossly intact Skin: Limited exam, no lesions noted    DATA: BMP Latest Ref Rng & Units 09/16/2018 01/25/2016 11/21/2015  Glucose 65 - 99 mg/dL 112(H) 94 111(H)  BUN 6 - 24 mg/dL 7 13 16   Creatinine 0.57 - 1.00 mg/dL 0.87 0.88 0.73  BUN/Creat Ratio 9 - 23 8(L) - -  Sodium 134 - 144 mmol/L 140 140 136  Potassium 3.5 - 5.2 mmol/L 4.3 3.9 3.4(L)  Chloride 96 - 106 mmol/L 102 104 102  CO2 20 - 29 mmol/L 23 21(L) 24  Calcium 8.7 - 10.2 mg/dL 9.5 10.1 9.2   CBC Latest Ref Rng & Units 09/16/2018 11/21/2015 02/06/2015  WBC 3.4 - 10.8 x10E3/uL 7.3 6.6 11.2(H)  Hemoglobin 11.1 - 15.9 g/dL 12.8 12.9 13.0  Hematocrit 34.0 - 46.6 % 39.2 38.6 39.4  Platelets 150 - 450 x10E3/uL 313 224 221    CXR: No new film  IMPRESSION: Possible asthma  Exertional dyspnea - Plan: ECHOCARDIOGRAM COMPLETE  Very mild bronchiectasis-not likely a major contributor to her current respiratory symptoms   We reviewed the results of her recent heart catheterization and its implications.  We reviewed her pulmonary function test together.  Although there is an apparent improvement after bronchodilator therapy, review of the flow  volume curves suggests to me that the improvement might simply be a "training effect".  In other words, I think she simply got better at performing the spirometric maneuver.    We discussed her symptoms again in detail.  I emphasized that I do not know for certain the cause of her symptoms but believe that we have all but ruled out potentially life-threatening conditions.  We discussed her use of  inhaler medicines during which it became evident that she is not using the [facility] as a controller medication but rather as a rescue medication.  In summary, the extent of her symptoms are not well explained by any identified pulmonary pathology.  Her cardiac evaluation all but rules out a cardiac cause.  Although her chest x-ray and EKG reveal no evidence of such, we should consider the possibility of occult pulmonary hypertension in a woman of her age.  Alternatively, episodic dyspnea with chest tightness could simply be due to asthma though the severity of her symptoms seems to be out of proportion to any findings suggesting possible asthma.[name]    PLAN: 1) echocardiogram requested to evaluate for pulmonary hypertension 2) inhaler medications clarified.  She is to use Dulera 2 actuations twice a day.  She is instructed to rinse her mouth after use.  Albuterol is to be used as needed for increased shortness of breath, wheezing, chest tightness, cough.  She is to pay close attention to whether the albuterol truly relieves her symptoms.  She may use it up to 4 times per day.  She will follow-up in 6 weeks and call sooner if needed.  Merton Border, MD PCCM service Mobile [phone] Pager [phone] 10/06/2018 12:15 PM

## 2018-10-05 NOTE — Patient Instructions (Addendum)
Echocardiogram ordered to rule out pulmonary hypertension  Clarification of inhaler medications: -Dulera (blue inhaler) 2 actuations twice a day every day.  Rinse mouth after use -Albuterol (ProAir), 2 actuations as needed for increased shortness of breath, wheezing, chest tightness, cough.  Please pay attention to whether this relieves symptoms.  May use up to 4 times per day  Follow-up in 6 weeks.  Call sooner if needed

## 2018-10-06 NOTE — Addendum Note (Signed)
Addended by: Merton Border B on: 10/06/2018 12:25 PM   Modules accepted: Level of Service

## 2018-10-19 ENCOUNTER — Ambulatory Visit: Payer: PPO | Admitting: Internal Medicine

## 2018-10-19 NOTE — Progress Notes (Deleted)
Follow-up Outpatient Visit Date: 10/19/2018  Primary Care Provider: Cristy Folks, PA-C [place] [address]  Chief Complaint: ***  HPI:  Melinda Shaw is a 52 y.o. year-old female with history of hypertension, hyperlipidemia, bronchiectasis, fibromyalgia, migraine headaches, and anxiety/depression, who presents for follow-up of chest pain.  I met her about a month ago, which time she reported progressive chest pain and shortness of breath concerning for unstable angina.  Subsequent cardiac catheterization showed no evidence of CAD with normal LVEF and filling pressures..  Therapy for microvascular disease with propranolol and isosorbide mononitrate was continued.  --------------------------------------------------------------------------------------------------  Cardiovascular History & Procedures: Cardiovascular Problems:  Chest pain  Shortness of breath and history of bronchiectasis  Risk Factors:  Hypertension, hyperlipidemia, and obesity  Cath/PCI:  LHC (09/22/2018): No angiographically significant CAD.  Normal LVEF (greater than 65%) and upper normal LVEDP paren 15 mmHg).  CV Surgery:  None  EP Procedures and Devices:  None  Non-Invasive Evaluation(s):  TTE (04/17/2015): Normal LV size and wall thickness.  LVEF 60 to 65% with normal wall motion and diastolic function.  Normal RV size and function.  No significant valvular abnormalities.  Recent CV Pertinent Labs: Lab Results  Component Value Date   BNP 10 02/06/2015   K 4.3 09/16/2018   K 3.1 (L) 02/06/2015   BUN 7 09/16/2018   BUN 14 02/06/2015   CREATININE 0.87 09/16/2018   CREATININE 0.73 02/06/2015    Past medical and surgical history were reviewed and updated in EPIC.  No outpatient medications have been marked as taking for the 10/19/18 encounter (Appointment) with Asami Lambright, Harrell Gave, MD.    Allergies: Marcelline Mates; Shellfish allergy; Sulfa antibiotics; and Bee  venom  Social History   Tobacco Use  . Smoking status: Never Smoker  . Smokeless tobacco: Never Used  Substance Use Topics  . Alcohol use: No    Alcohol/week: 0.0 standard drinks  . Drug use: No    Family History  Problem Relation Age of Onset  . COPD Mother   . High Cholesterol Mother   . Heart failure Mother   . Hypertension Mother   . Hypertension Father   . AAA (abdominal aortic aneurysm) Sister   . COPD Sister   . High Cholesterol Sister     Review of Systems: A 12-system review of systems was performed and was negative except as noted in the HPI.  --------------------------------------------------------------------------------------------------  Physical Exam: LMP 09/24/2016 (Approximate)   General:  *** HEENT: No conjunctival pallor or scleral icterus. Moist mucous membranes.  OP clear. Neck: Supple without lymphadenopathy, thyromegaly, JVD, or HJR. No carotid bruit. Lungs: Normal work of breathing. Clear to auscultation bilaterally without wheezes or crackles. Heart: Regular rate and rhythm without murmurs, rubs, or gallops. Non-displaced PMI. Abd: Bowel sounds present. Soft, NT/ND without hepatosplenomegaly Ext: No lower extremity edema. Radial, PT, and DP pulses are 2+ bilaterally. Skin: Warm and dry without rash.  EKG:  ***  Lab Results  Component Value Date   WBC 7.3 09/16/2018   HGB 12.8 09/16/2018   HCT 39.2 09/16/2018   MCV 91 09/16/2018   PLT 313 09/16/2018    Lab Results  Component Value Date   NA 140 09/16/2018   K 4.3 09/16/2018   CL 102 09/16/2018   CO2 23 09/16/2018   BUN 7 09/16/2018   CREATININE 0.87 09/16/2018   GLUCOSE 112 (H) 09/16/2018   ALT 39 11/21/2015    No results found for: CHOL, HDL, LDLCALC, LDLDIRECT, TRIG,  CHOLHDL  --------------------------------------------------------------------------------------------------  ASSESSMENT AND PLAN: ***  Melinda Bush, MD 10/19/2018 6:56 AM

## 2018-10-29 ENCOUNTER — Other Ambulatory Visit: Payer: Self-pay

## 2018-10-29 ENCOUNTER — Ambulatory Visit (INDEPENDENT_AMBULATORY_CARE_PROVIDER_SITE_OTHER): Payer: PPO

## 2018-10-29 DIAGNOSIS — R0609 Other forms of dyspnea: Secondary | ICD-10-CM | POA: Diagnosis not present

## 2018-10-30 ENCOUNTER — Ambulatory Visit: Payer: PPO | Admitting: Internal Medicine

## 2018-11-06 ENCOUNTER — Ambulatory Visit: Payer: PPO | Admitting: Nurse Practitioner

## 2018-11-20 ENCOUNTER — Ambulatory Visit: Payer: PPO | Admitting: Pulmonary Disease

## 2018-11-23 ENCOUNTER — Ambulatory Visit: Payer: PPO | Admitting: Pulmonary Disease

## 2018-11-27 ENCOUNTER — Encounter: Payer: Self-pay | Admitting: Nurse Practitioner

## 2018-11-27 ENCOUNTER — Ambulatory Visit (INDEPENDENT_AMBULATORY_CARE_PROVIDER_SITE_OTHER): Payer: PPO | Admitting: Nurse Practitioner

## 2018-11-27 VITALS — BP 128/84 | HR 55 | Ht 62.0 in | Wt 185.0 lb

## 2018-11-27 DIAGNOSIS — I10 Essential (primary) hypertension: Secondary | ICD-10-CM | POA: Diagnosis not present

## 2018-11-27 DIAGNOSIS — R0789 Other chest pain: Secondary | ICD-10-CM

## 2018-11-27 DIAGNOSIS — R0609 Other forms of dyspnea: Secondary | ICD-10-CM

## 2018-11-27 MED ORDER — ISOSORBIDE MONONITRATE ER 30 MG PO TB24: 30.0000 mg | ORAL_TABLET | Freq: Every day | ORAL | 3 refills | Status: DC

## 2018-11-27 NOTE — Progress Notes (Signed)
Office Visit    Patient Name: Melinda Shaw Date of Encounter: 11/27/2018  Primary Care Provider:  Cristy Folks, PA-C Primary Cardiologist:  Nelva Bush, MD  Chief Complaint    53 year old female with a history of chest pain, dyspnea on exertion, fibromyalgia, hypertension, hyperlipidemia, GERD, and bronchitis, who presents for follow-up after diagnostic catheterization in November.  Past Medical History    Past Medical History:  Diagnosis Date  . Anemia    H/O  . Anxiety   . Bronchitis   . Chest pain    a. 09/2018 Cath: Nl cors. EF 65%->Med rx (long-acting nitrate added).  . Depression   . Diastolic dysfunction    a. 10/2018 Echo: EF 55-60%, no rwma, Gr1DD. Mild MR. Nl RV fxn.  . DOE (dyspnea on exertion)   . Fibromyalgia   . GERD (gastroesophageal reflux disease)   . Headache   . Hyperlipidemia   . Hypertension    no meds  . PONV (postoperative nausea and vomiting)    Past Surgical History:  Procedure Laterality Date  . ANTERIOR CRUCIATE LIGAMENT REPAIR    . CHOLECYSTECTOMY    . ESOPHAGOGASTRODUODENOSCOPY (EGD) WITH PROPOFOL N/A 08/28/2015   Procedure: ESOPHAGOGASTRODUODENOSCOPY (EGD) WITH PROPOFOL;  Surgeon: Josefine Class, MD;  Location[facility] ENDOSCOPY;  Service: Endoscopy;  Laterality: N/A;  . KNEE SURGERY     left knee   . LEFT HEART CATH AND CORONARY ANGIOGRAPHY Left 09/22/2018   Procedure: LEFT HEART CATH AND CORONARY ANGIOGRAPHY;  Surgeon: Nelva Bush, MD;  Location: [place] CV LAB;  Service: Cardiovascular;  Laterality: Left;  [name] MASS EXCISION Left 01/26/2016   Procedure: EXCISION OF LEFT WRIST VOLAR MASS;  Surgeon: Charlotte Crumb, MD;  Location: [place];  Service: Orthopedics;  Laterality: Left;  . NOSE SURGERY    . POLYPECTOMY    . TUBAL LIGATION    . VIDEO BRONCHOSCOPY N/A 05/29/2015   Procedure: VIDEO BRONCHOSCOPY WITHOUT FLUORO;  Surgeon: Vilinda Boehringer, MD;  Location: [facility] ORS;  Service:  Cardiopulmonary;  Laterality: N/A;    Allergies  Allergies  Allergen Reactions  . Cherry Swelling    Swelling of lips and throat  . Shellfish Allergy Shortness Of Breath  . Sulfa Antibiotics Shortness Of Breath    Stopped breathing, Swelling of throat  . Bee Venom     Swelling of throat    History of Present Illness    53 year old female with the above past medical history including exertional chest pain and dyspnea, hypertension, hyperlipidemia, migraines, GERD, fibromyalgia, and bronchitis.  She was recently evaluated by Dr. Saunders Revel in November 2019 in the setting of a several month history of exertional chest pain and dyspnea.  She underwent diagnostic catheterization on November 12 which showed normal coronary arteries and normal LV function.  There was some concern for the possibility of coronary vasospasm, and she was placed on low-dose long-acting nitrate therapy.  She has been on beta-blocker therapy for several years in the setting of migraines and hypertension.  An echocardiogram was carried out in December which showed normal LV function, grade 1 diastolic dysfunction, and mild mitral irritation.  Since her last visit and catheterization, she has continued to have daily rest and exertional sharp, right-sided chest discomfort, that worsens with activity and persists for several hours.  Discomfort is worse with deep breathing and also palpation.  She does not think nitrates have changed much up to this point.  She occasionally notes lightheadedness.  She denies PND,  orthopnea, syncope, edema, palpitations, or early satiety.  Home Medications    Prior to Admission medications   Medication Sig Start Date End Date Taking? Authorizing Provider  albuterol (PROVENTIL HFA;VENTOLIN HFA) 108 (90 BASE) MCG/ACT inhaler Inhale 2 puffs into the lungs every 6 (six) hours as needed for wheezing or shortness of breath.   Yes [provider]  cyclobenzaprine (FLEXERIL) 10 MG tablet Take 1  tablet (10 mg total) by mouth 3 (three) times daily as needed for muscle spasms. Patient taking differently: Take 10 mg by mouth at bedtime.  10/10/16  Yes Hagler, Jami L, PA-C  eletriptan (RELPAX) 20 MG tablet Take 20 mg by mouth every 2 (two) hours as needed for migraine.    Yes [provider]  EPINEPHrine (EPIPEN 2-PAK) 0.3 mg/0.3 mL IJ SOAJ injection Inject 0.3 mLs (0.3 mg total) into the muscle once. Patient taking differently: Inject 0.3 mg into the muscle daily as needed (anaphylactic allergic reactions.).  05/31/16  Yes Nance Pear, MD  gabapentin (NEURONTIN) 100 MG capsule Take 200 mg by mouth at bedtime.    Yes [provider]  hydrOXYzine (VISTARIL) 25 MG capsule Take 25 mg by mouth 3 (three) times daily.   Yes [provider]  isosorbide mononitrate (IMDUR) 30 MG 24 hr tablet Take 0.5 tablets (15 mg total) by mouth daily. 09/16/18  Yes End, Harrell Gave, MD  lithium carbonate 300 MG capsule Take 600 mg by mouth at bedtime.   Yes [provider]  mirtazapine (REMERON) 30 MG tablet Take 30 mg by mouth at bedtime.   Yes [provider]  mometasone-formoterol (DULERA) 200-5 MCG/ACT AERO Inhale 2 puffs into the lungs 2 (two) times daily. 03/28/16  Yes Mungal, Vishal, MD  nitroGLYCERIN (NITROSTAT) 0.4 MG SL tablet Place 1 tablet (0.4 mg total) under the tongue every 5 (five) minutes as needed for chest pain. Maximum of 3 doses. 09/16/18 12/15/18 Yes End, Harrell Gave, MD  omeprazole (PRILOSEC) 40 MG capsule Take 40 mg by mouth 2 (two) times daily.    Yes [provider]  ondansetron (ZOFRAN ODT) 4 MG disintegrating tablet Take 1 tablet (4 mg total) by mouth every 8 (eight) hours as needed for nausea or vomiting. 03/05/17  Yes Letitia Neri L, PA-C  propranolol ER (INDERAL LA) 80 MG 24 hr capsule Take 80 mg by mouth at bedtime.   Yes [provider]  Respiratory Therapy Supplies (FLUTTER) DEVI Use 10-15 times daily DX: Mild  Bronchiectasis DX Code:J47.1 01/10/17  Yes Wilhelmina Mcardle, MD  SUMAtriptan (IMITREX) 50 MG tablet Take 50 mg by mouth every 2 (two) hours as needed for migraine. May repeat in 2 hours if headache persists or recurs.   Yes [provider]  tiZANidine (ZANAFLEX) 4 MG tablet Take 4 mg by mouth at bedtime.    Yes [provider]  venlafaxine XR (EFFEXOR-XR) 150 MG 24 hr capsule Take 150 mg by mouth daily.   Yes [provider]  zolpidem (AMBIEN) 10 MG tablet Take 10 mg by mouth at bedtime.   Yes [provider]    Review of Systems    Ongoing rest and exertional chest pain and dyspnea associated with chest wall soreness and worsening of symptoms with deep breathing.  Occasional lightheadedness.  She denies palpitations, PND, orthopnea, syncope, edema, or early satiety.  All other systems reviewed and are otherwise negative except as noted above.  Physical Exam    VS:  BP 128/84 (BP Location: Left Arm, Patient Position:  Sitting, Cuff Size: Normal)   Pulse (!) 55   Ht 5\' 2"  (1.575 m)   Wt 185 lb (83.9 kg)   LMP 09/24/2016 (Approximate)   BMI 33.84 kg/m  , BMI Body mass index is 33.84 kg/m. GEN: Well nourished, well developed, in no acute distress. HEENT: normal. Neck: Supple, no JVD, carotid bruits, or masses. Cardiac: RRR, no murmurs, rubs, or gallops. No clubbing, cyanosis, edema.  Radials/PT 2+ and equal bilaterally.  Right wrist catheterization site without bleeding, bruit, or hematoma. Respiratory:  Respirations regular and unlabored, clear to auscultation bilaterally. GI: Soft, nontender, nondistended, BS + x 4. MS: no deformity or atrophy. Skin: warm and dry, no rash. Neuro:  Strength and sensation are intact. Psych: Normal affect.  Accessory Clinical Findings    ECG personally reviewed by me today -sinus bradycardia, 55- no acute changes.  Assessment & Plan    1.  Atypical chest pain: Patient has a several month history of rest and  exertional right-sided sharp chest discomfort that is worse with deep breathing and palpation.  She also has associated dyspnea.  She underwent diagnostic catheterization in November which showed normal coronary arteries and normal LV function.  Follow-up echocardiogram in December showed normal LV function, grade 1 diastolic dysfunction, and mild mitral regurgitation.  She has been on long-acting nitrate therapy and beta-blocker due to potential concern for vasospastic disease.  I advised that she may increase her isosorbide to 1 whole tablet (30 mg) daily to see if this changes symptoms though overall, symptoms appear to be noncardiac.  She will try the higher dose of isosorbide but given her history of headaches, she is advised to immediately reduce the dose back to 50 mg daily if she does not see any significant improvement.  2.  Essential hypertension: Relatively stable on beta-blocker and nitrate therapy.  3.  Dyspnea on exertion: As above, normal coronary arteries and normal LV function.  She does have grade 1 diastolic dysfunction though is euvolemic on exam and blood pressure is stable.  She is on inhaler therapy and followed by pulmonology.  4.  Disposition: Follow-up in 6 months or sooner if necessary.   Murray Hodgkins, NP 11/27/2018, 11:11 AM

## 2018-11-27 NOTE — Patient Instructions (Signed)
Medication Instructions:  Your physician has recommended you make the following change in your medication:  1. INCREASE Isosorbide mononitrate 30 mg Once Daily  If you need a refill on your cardiac medications before your next appointment, please call your pharmacy.   Lab work: None today If you have labs (blood work) drawn today and your tests are completely normal, you will receive your results only by: [name] MyChart Message (if you have MyChart) OR . A paper copy in the mail If you have any lab test that is abnormal or we need to change your treatment, we will call you to review the results.  Testing/Procedures: None today  Follow-Up: At [facility], you and your health needs are our priority.  As part of our continuing mission to provide you with exceptional heart care, we have created designated Provider Care Teams.  These Care Teams include your primary Cardiologist (physician) and Advanced Practice Providers (APPs -  Physician Assistants and Nurse Practitioners) who all work together to provide you with the care you need, when you need it. You will need a follow up appointment in 6 months.  Please call our office 2 months in advance to schedule this appointment.  You may see Dr. Harrell Gave End or one of the following Advanced Practice Providers on your designated Care Team:   Murray Hodgkins, NP Christell Faith, PA-C . Marrianne Mood, PA-C  Any Other Special Instructions Will Be Listed Below (If Applicable).

## 2018-12-15 DIAGNOSIS — R7309 Other abnormal glucose: Secondary | ICD-10-CM | POA: Diagnosis not present

## 2018-12-15 DIAGNOSIS — R55 Syncope and collapse: Secondary | ICD-10-CM | POA: Diagnosis not present

## 2018-12-15 DIAGNOSIS — Z23 Encounter for immunization: Secondary | ICD-10-CM | POA: Diagnosis not present

## 2018-12-15 DIAGNOSIS — N939 Abnormal uterine and vaginal bleeding, unspecified: Secondary | ICD-10-CM | POA: Diagnosis not present

## 2018-12-15 DIAGNOSIS — F331 Major depressive disorder, recurrent, moderate: Secondary | ICD-10-CM | POA: Diagnosis not present

## 2018-12-15 DIAGNOSIS — J449 Chronic obstructive pulmonary disease, unspecified: Secondary | ICD-10-CM | POA: Diagnosis not present

## 2018-12-15 DIAGNOSIS — F4312 Post-traumatic stress disorder, chronic: Secondary | ICD-10-CM | POA: Diagnosis not present

## 2018-12-17 ENCOUNTER — Other Ambulatory Visit: Payer: Self-pay | Admitting: Physician Assistant

## 2018-12-17 DIAGNOSIS — R740 Nonspecific elevation of levels of transaminase and lactic acid dehydrogenase [LDH]: Principal | ICD-10-CM

## 2018-12-17 DIAGNOSIS — R7401 Elevation of levels of liver transaminase levels: Secondary | ICD-10-CM

## 2018-12-22 ENCOUNTER — Ambulatory Visit
Admission: RE | Admit: 2018-12-22 | Discharge: 2018-12-22 | Disposition: A | Discharge status: Home / Self Care | Payer: PPO | Source: Ambulatory Visit | Attending: Physician Assistant | Admitting: Physician Assistant

## 2018-12-22 DIAGNOSIS — R740 Nonspecific elevation of levels of transaminase and lactic acid dehydrogenase [LDH]: Secondary | ICD-10-CM | POA: Insufficient documentation

## 2018-12-22 DIAGNOSIS — K7689 Other specified diseases of liver: Secondary | ICD-10-CM | POA: Diagnosis not present

## 2018-12-22 DIAGNOSIS — R7401 Elevation of levels of liver transaminase levels: Secondary | ICD-10-CM

## 2019-01-05 DIAGNOSIS — Z1389 Encounter for screening for other disorder: Secondary | ICD-10-CM | POA: Diagnosis not present

## 2019-01-05 DIAGNOSIS — Z3200 Encounter for pregnancy test, result unknown: Secondary | ICD-10-CM | POA: Diagnosis not present

## 2019-01-05 DIAGNOSIS — N939 Abnormal uterine and vaginal bleeding, unspecified: Secondary | ICD-10-CM | POA: Diagnosis not present

## 2019-01-07 DIAGNOSIS — R002 Palpitations: Secondary | ICD-10-CM | POA: Diagnosis not present

## 2019-01-07 DIAGNOSIS — E669 Obesity, unspecified: Secondary | ICD-10-CM | POA: Diagnosis not present

## 2019-01-07 DIAGNOSIS — R079 Chest pain, unspecified: Secondary | ICD-10-CM | POA: Diagnosis not present

## 2019-01-07 DIAGNOSIS — R55 Syncope and collapse: Secondary | ICD-10-CM | POA: Diagnosis not present

## 2019-01-07 DIAGNOSIS — R5383 Other fatigue: Secondary | ICD-10-CM | POA: Diagnosis not present

## 2019-01-07 DIAGNOSIS — Z9049 Acquired absence of other specified parts of digestive tract: Secondary | ICD-10-CM | POA: Diagnosis not present

## 2019-01-07 DIAGNOSIS — J449 Chronic obstructive pulmonary disease, unspecified: Secondary | ICD-10-CM | POA: Diagnosis not present

## 2019-01-07 DIAGNOSIS — F329 Major depressive disorder, single episode, unspecified: Secondary | ICD-10-CM | POA: Diagnosis not present

## 2019-01-07 DIAGNOSIS — Z882 Allergy status to sulfonamides status: Secondary | ICD-10-CM | POA: Diagnosis not present

## 2019-01-07 DIAGNOSIS — Z6834 Body mass index (BMI) 34.0-34.9, adult: Secondary | ICD-10-CM | POA: Diagnosis not present

## 2019-01-15 ENCOUNTER — Other Ambulatory Visit: Payer: Self-pay | Admitting: Physician Assistant

## 2019-01-15 DIAGNOSIS — R002 Palpitations: Secondary | ICD-10-CM | POA: Diagnosis not present

## 2019-01-15 DIAGNOSIS — R079 Chest pain, unspecified: Secondary | ICD-10-CM | POA: Diagnosis not present

## 2019-01-15 DIAGNOSIS — R55 Syncope and collapse: Secondary | ICD-10-CM | POA: Diagnosis not present

## 2019-01-15 DIAGNOSIS — N939 Abnormal uterine and vaginal bleeding, unspecified: Secondary | ICD-10-CM

## 2019-01-22 ENCOUNTER — Ambulatory Visit
Admission: RE | Admit: 2019-01-22 | Discharge: 2019-01-22 | Disposition: A | Discharge status: Home / Self Care | Payer: PPO | Source: Ambulatory Visit | Attending: Physician Assistant | Admitting: Physician Assistant

## 2019-01-22 ENCOUNTER — Other Ambulatory Visit: Payer: Self-pay

## 2019-01-22 DIAGNOSIS — N939 Abnormal uterine and vaginal bleeding, unspecified: Secondary | ICD-10-CM | POA: Diagnosis not present

## 2019-01-22 DIAGNOSIS — N83291 Other ovarian cyst, right side: Secondary | ICD-10-CM | POA: Diagnosis not present

## 2019-01-22 DIAGNOSIS — R55 Syncope and collapse: Secondary | ICD-10-CM | POA: Diagnosis not present

## 2019-01-22 DIAGNOSIS — R569 Unspecified convulsions: Secondary | ICD-10-CM | POA: Insufficient documentation

## 2019-01-26 ENCOUNTER — Other Ambulatory Visit: Payer: Self-pay | Admitting: Acute Care

## 2019-01-26 DIAGNOSIS — R55 Syncope and collapse: Secondary | ICD-10-CM

## 2019-01-26 DIAGNOSIS — R569 Unspecified convulsions: Secondary | ICD-10-CM

## 2019-01-28 ENCOUNTER — Telehealth: Payer: Self-pay | Admitting: Gastroenterology

## 2019-01-28 NOTE — Telephone Encounter (Signed)
R/s pt apt due to corona virus with Dr. Vicente Males pt is aware if she needs emergency apt to call office to schedule that or phone apts available as well.

## 2019-01-31 ENCOUNTER — Ambulatory Visit
Admission: RE | Admit: 2019-01-31 | Discharge: 2019-01-31 | Disposition: A | Discharge status: Home / Self Care | Payer: PPO | Source: Ambulatory Visit | Attending: Acute Care | Admitting: Acute Care

## 2019-01-31 ENCOUNTER — Other Ambulatory Visit: Payer: Self-pay

## 2019-01-31 DIAGNOSIS — R569 Unspecified convulsions: Secondary | ICD-10-CM | POA: Diagnosis not present

## 2019-01-31 DIAGNOSIS — R55 Syncope and collapse: Secondary | ICD-10-CM | POA: Insufficient documentation

## 2019-02-01 ENCOUNTER — Ambulatory Visit: Payer: PPO | Admitting: Gastroenterology

## 2019-02-03 DIAGNOSIS — R079 Chest pain, unspecified: Secondary | ICD-10-CM | POA: Diagnosis not present

## 2019-02-03 DIAGNOSIS — R002 Palpitations: Secondary | ICD-10-CM | POA: Diagnosis not present

## 2019-02-03 DIAGNOSIS — R55 Syncope and collapse: Secondary | ICD-10-CM | POA: Diagnosis not present

## 2019-02-09 ENCOUNTER — Encounter: Payer: Self-pay | Admitting: Certified Nurse Midwife

## 2019-03-16 ENCOUNTER — Other Ambulatory Visit: Payer: Self-pay

## 2019-03-16 DIAGNOSIS — N939 Abnormal uterine and vaginal bleeding, unspecified: Secondary | ICD-10-CM | POA: Diagnosis not present

## 2019-03-16 DIAGNOSIS — R55 Syncope and collapse: Secondary | ICD-10-CM | POA: Diagnosis not present

## 2019-03-16 DIAGNOSIS — N8500 Endometrial hyperplasia, unspecified: Secondary | ICD-10-CM | POA: Diagnosis not present

## 2019-03-16 DIAGNOSIS — R740 Nonspecific elevation of levels of transaminase and lactic acid dehydrogenase [LDH]: Secondary | ICD-10-CM | POA: Diagnosis not present

## 2019-03-16 DIAGNOSIS — F419 Anxiety disorder, unspecified: Secondary | ICD-10-CM | POA: Diagnosis not present

## 2019-03-16 DIAGNOSIS — N83201 Unspecified ovarian cyst, right side: Secondary | ICD-10-CM | POA: Diagnosis not present

## 2019-03-23 ENCOUNTER — Ambulatory Visit: Payer: PPO | Admitting: Gastroenterology

## 2019-03-24 DIAGNOSIS — R569 Unspecified convulsions: Secondary | ICD-10-CM | POA: Diagnosis not present

## 2019-03-25 ENCOUNTER — Telehealth: Payer: Self-pay

## 2019-03-25 NOTE — Telephone Encounter (Signed)
Coronavirus (COVID-19) Are you at risk?  Are you at risk for the Coronavirus (COVID-19)?  To be considered HIGH RISK for Coronavirus (COVID-19), you have to meet the following criteria:  . Traveled to [place], [place], [place], [place] or [place]; or in the [place] to [place], [place], [place], or [place]; and have fever, cough, and shortness of breath within the last 2 weeks of travel OR . Been in close contact with a person diagnosed with COVID-19 within the last 2 weeks and have fever, cough, and shortness of breath . IF YOU DO NOT MEET THESE CRITERIA, YOU ARE CONSIDERED LOW RISK FOR COVID-19.  What to do if you are HIGH RISK for COVID-19?  [name] If you are having a medical emergency, call 911. . Seek medical care right away. Before you go to a doctor's office, urgent care or emergency department, call ahead and tell them about your recent travel, contact with someone diagnosed with COVID-19, and your symptoms. You should receive instructions from your physician's office regarding next steps of care.  . When you arrive at healthcare provider, tell the healthcare staff immediately you have returned from visiting [place], [place], [place], [place] or [place]; or traveled in the [place] to [place], [place], [place], or [place]; in the last two weeks or you have been in close contact with a person diagnosed with COVID-19 in the last 2 weeks.   . Tell the health care staff about your symptoms: fever, cough and shortness of breath. . After you have been seen by a medical provider, you will be either: o Tested for (COVID-19) and discharged home on quarantine except to seek medical care if symptoms worsen, and asked to  - Stay home and avoid contact with others until you get your results (4-5 days)  - Avoid travel on public transportation if possible (such as bus, train, or airplane) or o Sent to the Emergency Department by EMS for evaluation, COVID-19 testing, and possible  admission depending on your condition and test results.  What to do if you are LOW RISK for COVID-19?  Reduce your risk of any infection by using the same precautions used for avoiding the common cold or flu:  [name] Wash your hands often with soap and warm water for at least 20 seconds.  If soap and water are not readily available, use an alcohol-based hand sanitizer with at least 60% alcohol.  . If coughing or sneezing, cover your mouth and nose by coughing or sneezing into the elbow areas of your shirt or coat, into a tissue or into your sleeve (not your hands). . Avoid shaking hands with others and consider head nods or verbal greetings only. . Avoid touching your eyes, nose, or mouth with unwashed hands.  . Avoid close contact with people who are Melinda Shaw. . Avoid places or events with large numbers of people in one location, like concerts or sporting events. . Carefully consider travel plans you have or are making. . If you are planning any travel outside or inside the [place], visit the CDC's Travelers' Health webpage for the latest health notices. . If you have some symptoms but not all symptoms, continue to monitor at home and seek medical attention if your symptoms worsen. . If you are having a medical emergency, call 911.  03/25/19 SCREENING NEG SLS ADDITIONAL HEALTHCARE OPTIONS FOR PATIENTS  [place] [facility] / e-Visit: [email or web address]         [facility]: [phone]  [facility]: [phone]                   [facility]: [phone]  

## 2019-03-26 ENCOUNTER — Encounter: Payer: Self-pay | Admitting: Certified Nurse Midwife

## 2019-03-26 ENCOUNTER — Other Ambulatory Visit: Payer: Self-pay

## 2019-03-26 ENCOUNTER — Ambulatory Visit (INDEPENDENT_AMBULATORY_CARE_PROVIDER_SITE_OTHER): Payer: PPO | Admitting: Certified Nurse Midwife

## 2019-03-26 ENCOUNTER — Other Ambulatory Visit (HOSPITAL_COMMUNITY)
Admission: RE | Admit: 2019-03-26 | Discharge: 2019-03-26 | Disposition: A | Discharge status: Home / Self Care | Payer: PPO | Source: Ambulatory Visit | Attending: Certified Nurse Midwife | Admitting: Certified Nurse Midwife

## 2019-03-26 VITALS — BP 132/89 | HR 73 | Ht 63.0 in | Wt 192.1 lb

## 2019-03-26 DIAGNOSIS — N939 Abnormal uterine and vaginal bleeding, unspecified: Secondary | ICD-10-CM

## 2019-03-26 DIAGNOSIS — R9389 Abnormal findings on diagnostic imaging of other specified body structures: Secondary | ICD-10-CM | POA: Diagnosis not present

## 2019-03-26 DIAGNOSIS — N83201 Unspecified ovarian cyst, right side: Secondary | ICD-10-CM

## 2019-03-26 DIAGNOSIS — Z8742 Personal history of other diseases of the female genital tract: Secondary | ICD-10-CM

## 2019-03-26 MED ORDER — MEDROXYPROGESTERONE ACETATE 5 MG PO TABS: 5.0000 mg | ORAL_TABLET | Freq: Every day | ORAL | 0 refills | Status: DC

## 2019-03-26 MED ORDER — KETOROLAC TROMETHAMINE 30 MG/ML IJ SOLN
30.0000 mg | Freq: Once | INTRAMUSCULAR | Status: AC
  Administered 2019-03-26: 16:00:00 30 mg via INTRAVENOUS

## 2019-03-26 MED ORDER — ACETAMINOPHEN-CODEINE #3 300-30 MG PO TABS: 1.0000 | ORAL_TABLET | Freq: Three times a day (TID) | ORAL | 0 refills | Status: DC | PRN

## 2019-03-26 NOTE — Patient Instructions (Signed)

## 2019-03-26 NOTE — Progress Notes (Signed)
Endometrial Biopsy Procedure Note  Pre-operative Diagnosis: postmenopausal bleeding, thickend endometrium on ultrasound   Post-operative Diagnosis: same  Indications: abnormal uterine bleeding  Procedure Details   Urine pregnancy test was not done.  The risks (including infection, bleeding, pain, and uterine perforation) and benefits of the procedure were explained to the patient and Verbal informed consent was obtained.   The patient was placed in the dorsal lithotomy position.  Bimanual exam showed the uterus to be in the anteroflexed position.  A speculum inserted in the vagina, and the cervix prepped with povidone iodine.  Endocervical curettage with a Kevorkian curette was not performed.   A sharp tenaculum was applied to the anterior lip of the cervix for stabilization. The three smallest dilators were used on the cervix to allow for passing of pipet. .  A Pipelle endometrial aspirator was used to sample the endometrium.  Sample was sent for pathologic examination. Dr. Marcelline Mates was present to assist with cervical dilators.    Condition: Stable  Complications: None  Plan:  The patient was advised to call for any fever or for prolonged or severe pain or bleeding. She was advised to use Tylenol #3 as needed for mild to moderate pain. She was advised to avoid vaginal intercourse for 48 hours or until the bleeding has completely stopped. Provera 5 mg daily order for cyst management ( Dr. Marcelline Mates consulted). Follow up 1 wks for u/s to evaluated right ovarian cysts pain.   Philip Aspen, CNM

## 2019-03-30 ENCOUNTER — Ambulatory Visit (INDEPENDENT_AMBULATORY_CARE_PROVIDER_SITE_OTHER): Payer: PPO

## 2019-03-30 ENCOUNTER — Other Ambulatory Visit: Payer: Self-pay

## 2019-03-30 DIAGNOSIS — N83201 Unspecified ovarian cyst, right side: Secondary | ICD-10-CM

## 2019-04-07 ENCOUNTER — Other Ambulatory Visit: Payer: Self-pay | Admitting: Certified Nurse Midwife

## 2019-04-07 DIAGNOSIS — R42 Dizziness and giddiness: Secondary | ICD-10-CM | POA: Insufficient documentation

## 2019-04-07 DIAGNOSIS — R102 Pelvic and perineal pain: Secondary | ICD-10-CM

## 2019-04-07 DIAGNOSIS — G43109 Migraine with aura, not intractable, without status migrainosus: Secondary | ICD-10-CM | POA: Insufficient documentation

## 2019-04-07 DIAGNOSIS — R55 Syncope and collapse: Secondary | ICD-10-CM | POA: Diagnosis not present

## 2019-04-13 DIAGNOSIS — G43709 Chronic migraine without aura, not intractable, without status migrainosus: Secondary | ICD-10-CM | POA: Diagnosis not present

## 2019-04-13 DIAGNOSIS — N939 Abnormal uterine and vaginal bleeding, unspecified: Secondary | ICD-10-CM | POA: Diagnosis not present

## 2019-04-13 DIAGNOSIS — I639 Cerebral infarction, unspecified: Secondary | ICD-10-CM | POA: Diagnosis not present

## 2019-04-13 DIAGNOSIS — N8500 Endometrial hyperplasia, unspecified: Secondary | ICD-10-CM | POA: Diagnosis not present

## 2019-04-13 DIAGNOSIS — R55 Syncope and collapse: Secondary | ICD-10-CM | POA: Diagnosis not present

## 2019-04-13 DIAGNOSIS — N83201 Unspecified ovarian cyst, right side: Secondary | ICD-10-CM | POA: Diagnosis not present

## 2019-04-14 ENCOUNTER — Telehealth: Payer: Self-pay

## 2019-04-14 NOTE — Telephone Encounter (Signed)
Per CM- results of pelvic ultrasound done 03/30/19 at [facility] be faxed to Dr. Delfina Redwood at [phone]. Results faxed and confirmation of receipt received.

## 2019-04-22 ENCOUNTER — Encounter: Payer: Self-pay | Admitting: *Deleted

## 2019-04-26 DIAGNOSIS — R51 Headache: Secondary | ICD-10-CM | POA: Diagnosis not present

## 2019-04-27 ENCOUNTER — Encounter

## 2019-04-27 ENCOUNTER — Ambulatory Visit: Payer: PPO | Admitting: Gastroenterology

## 2019-04-29 ENCOUNTER — Ambulatory Visit: Payer: PPO | Admitting: Gastroenterology

## 2019-04-29 ENCOUNTER — Telehealth: Payer: Self-pay

## 2019-04-29 ENCOUNTER — Other Ambulatory Visit: Payer: Self-pay

## 2019-04-29 ENCOUNTER — Encounter: Payer: Self-pay | Admitting: Gastroenterology

## 2019-04-29 DIAGNOSIS — R7989 Other specified abnormal findings of blood chemistry: Secondary | ICD-10-CM

## 2019-04-29 DIAGNOSIS — R945 Abnormal results of liver function studies: Secondary | ICD-10-CM

## 2019-04-29 NOTE — Telephone Encounter (Signed)
Called pt to pre-chart for today's e-visit with Dr. Anna  Unable to contact. LVM to return call 

## 2019-04-29 NOTE — Progress Notes (Signed)
Unable to connect - advised to get labs and then office visit via my staff

## 2019-05-02 ENCOUNTER — Other Ambulatory Visit: Payer: Self-pay | Admitting: Certified Nurse Midwife

## 2019-05-10 ENCOUNTER — Telehealth: Payer: Self-pay

## 2019-05-10 NOTE — Telephone Encounter (Signed)
Refill sent provera

## 2019-05-13 DIAGNOSIS — N9489 Other specified conditions associated with female genital organs and menstrual cycle: Secondary | ICD-10-CM | POA: Diagnosis not present

## 2019-05-13 DIAGNOSIS — R9389 Abnormal findings on diagnostic imaging of other specified body structures: Secondary | ICD-10-CM | POA: Diagnosis not present

## 2019-05-13 DIAGNOSIS — N83201 Unspecified ovarian cyst, right side: Secondary | ICD-10-CM | POA: Diagnosis not present

## 2019-05-13 DIAGNOSIS — Z79899 Other long term (current) drug therapy: Secondary | ICD-10-CM | POA: Diagnosis not present

## 2019-05-13 DIAGNOSIS — R1909 Other intra-abdominal and pelvic swelling, mass and lump: Secondary | ICD-10-CM | POA: Diagnosis not present

## 2019-05-13 DIAGNOSIS — G893 Neoplasm related pain (acute) (chronic): Secondary | ICD-10-CM | POA: Diagnosis not present

## 2019-05-13 DIAGNOSIS — R102 Pelvic and perineal pain: Secondary | ICD-10-CM | POA: Diagnosis not present

## 2019-05-18 ENCOUNTER — Ambulatory Visit: Payer: PPO | Admitting: Gastroenterology

## 2019-05-25 DIAGNOSIS — N83201 Unspecified ovarian cyst, right side: Secondary | ICD-10-CM | POA: Diagnosis not present

## 2019-05-25 DIAGNOSIS — D251 Intramural leiomyoma of uterus: Secondary | ICD-10-CM | POA: Diagnosis not present

## 2019-05-25 DIAGNOSIS — R9389 Abnormal findings on diagnostic imaging of other specified body structures: Secondary | ICD-10-CM | POA: Diagnosis not present

## 2019-06-01 DIAGNOSIS — N9489 Other specified conditions associated with female genital organs and menstrual cycle: Secondary | ICD-10-CM | POA: Diagnosis not present

## 2019-06-01 DIAGNOSIS — N83201 Unspecified ovarian cyst, right side: Secondary | ICD-10-CM | POA: Diagnosis not present

## 2019-06-01 DIAGNOSIS — F419 Anxiety disorder, unspecified: Secondary | ICD-10-CM | POA: Diagnosis not present

## 2019-06-01 DIAGNOSIS — G8929 Other chronic pain: Secondary | ICD-10-CM | POA: Diagnosis not present

## 2019-06-01 DIAGNOSIS — R102 Pelvic and perineal pain: Secondary | ICD-10-CM | POA: Diagnosis not present

## 2019-06-01 DIAGNOSIS — R1909 Other intra-abdominal and pelvic swelling, mass and lump: Secondary | ICD-10-CM | POA: Diagnosis not present

## 2019-06-01 DIAGNOSIS — J449 Chronic obstructive pulmonary disease, unspecified: Secondary | ICD-10-CM | POA: Diagnosis not present

## 2019-06-08 DIAGNOSIS — R51 Headache: Secondary | ICD-10-CM | POA: Diagnosis not present

## 2019-06-08 DIAGNOSIS — R42 Dizziness and giddiness: Secondary | ICD-10-CM | POA: Diagnosis not present

## 2019-06-08 DIAGNOSIS — G43109 Migraine with aura, not intractable, without status migrainosus: Secondary | ICD-10-CM | POA: Diagnosis not present

## 2019-06-08 DIAGNOSIS — R55 Syncope and collapse: Secondary | ICD-10-CM | POA: Diagnosis not present

## 2019-06-14 DIAGNOSIS — Z1159 Encounter for screening for other viral diseases: Secondary | ICD-10-CM | POA: Diagnosis not present

## 2019-06-15 DIAGNOSIS — R1909 Other intra-abdominal and pelvic swelling, mass and lump: Secondary | ICD-10-CM | POA: Diagnosis not present

## 2019-06-15 DIAGNOSIS — N83201 Unspecified ovarian cyst, right side: Secondary | ICD-10-CM | POA: Diagnosis not present

## 2019-06-15 DIAGNOSIS — R102 Pelvic and perineal pain: Secondary | ICD-10-CM | POA: Diagnosis not present

## 2019-06-15 DIAGNOSIS — N9489 Other specified conditions associated with female genital organs and menstrual cycle: Secondary | ICD-10-CM | POA: Diagnosis not present

## 2019-06-15 DIAGNOSIS — G8929 Other chronic pain: Secondary | ICD-10-CM | POA: Diagnosis not present

## 2019-06-16 DIAGNOSIS — N83201 Unspecified ovarian cyst, right side: Secondary | ICD-10-CM | POA: Diagnosis not present

## 2019-06-16 DIAGNOSIS — R079 Chest pain, unspecified: Secondary | ICD-10-CM | POA: Diagnosis not present

## 2019-06-16 DIAGNOSIS — K639 Disease of intestine, unspecified: Secondary | ICD-10-CM | POA: Diagnosis not present

## 2019-06-16 DIAGNOSIS — D259 Leiomyoma of uterus, unspecified: Secondary | ICD-10-CM | POA: Diagnosis not present

## 2019-06-16 DIAGNOSIS — N83209 Unspecified ovarian cyst, unspecified side: Secondary | ICD-10-CM | POA: Diagnosis not present

## 2019-06-16 DIAGNOSIS — K388 Other specified diseases of appendix: Secondary | ICD-10-CM | POA: Diagnosis not present

## 2019-06-16 DIAGNOSIS — R350 Frequency of micturition: Secondary | ICD-10-CM | POA: Diagnosis not present

## 2019-06-16 DIAGNOSIS — R102 Pelvic and perineal pain: Secondary | ICD-10-CM | POA: Diagnosis not present

## 2019-06-16 DIAGNOSIS — D270 Benign neoplasm of right ovary: Secondary | ICD-10-CM | POA: Diagnosis not present

## 2019-06-16 DIAGNOSIS — K219 Gastro-esophageal reflux disease without esophagitis: Secondary | ICD-10-CM | POA: Diagnosis not present

## 2019-06-16 DIAGNOSIS — G8929 Other chronic pain: Secondary | ICD-10-CM | POA: Diagnosis not present

## 2019-06-16 DIAGNOSIS — F329 Major depressive disorder, single episode, unspecified: Secondary | ICD-10-CM | POA: Diagnosis not present

## 2019-06-16 DIAGNOSIS — D271 Benign neoplasm of left ovary: Secondary | ICD-10-CM | POA: Diagnosis not present

## 2019-06-16 DIAGNOSIS — Z79899 Other long term (current) drug therapy: Secondary | ICD-10-CM | POA: Diagnosis not present

## 2019-06-16 DIAGNOSIS — N888 Other specified noninflammatory disorders of cervix uteri: Secondary | ICD-10-CM | POA: Diagnosis not present

## 2019-06-16 DIAGNOSIS — K660 Peritoneal adhesions (postprocedural) (postinfection): Secondary | ICD-10-CM | POA: Diagnosis not present

## 2019-06-16 DIAGNOSIS — I10 Essential (primary) hypertension: Secondary | ICD-10-CM | POA: Diagnosis not present

## 2019-06-16 DIAGNOSIS — F419 Anxiety disorder, unspecified: Secondary | ICD-10-CM | POA: Diagnosis not present

## 2019-06-16 DIAGNOSIS — D279 Benign neoplasm of unspecified ovary: Secondary | ICD-10-CM | POA: Diagnosis not present

## 2019-06-16 DIAGNOSIS — J45909 Unspecified asthma, uncomplicated: Secondary | ICD-10-CM | POA: Diagnosis not present

## 2019-06-16 DIAGNOSIS — Z9641 Presence of insulin pump (external) (internal): Secondary | ICD-10-CM | POA: Diagnosis not present

## 2019-06-17 DIAGNOSIS — K6389 Other specified diseases of intestine: Secondary | ICD-10-CM | POA: Diagnosis not present

## 2019-06-17 DIAGNOSIS — R109 Unspecified abdominal pain: Secondary | ICD-10-CM | POA: Diagnosis not present

## 2019-06-22 ENCOUNTER — Ambulatory Visit: Payer: PPO | Admitting: Gastroenterology

## 2019-06-23 DIAGNOSIS — Z6833 Body mass index (BMI) 33.0-33.9, adult: Secondary | ICD-10-CM | POA: Diagnosis not present

## 2019-06-23 DIAGNOSIS — J449 Chronic obstructive pulmonary disease, unspecified: Secondary | ICD-10-CM | POA: Diagnosis not present

## 2019-06-23 DIAGNOSIS — Z9049 Acquired absence of other specified parts of digestive tract: Secondary | ICD-10-CM | POA: Diagnosis not present

## 2019-06-23 DIAGNOSIS — I10 Essential (primary) hypertension: Secondary | ICD-10-CM | POA: Diagnosis not present

## 2019-06-23 DIAGNOSIS — K388 Other specified diseases of appendix: Secondary | ICD-10-CM | POA: Diagnosis not present

## 2019-06-23 DIAGNOSIS — C181 Malignant neoplasm of appendix: Secondary | ICD-10-CM | POA: Diagnosis not present

## 2019-06-23 DIAGNOSIS — F419 Anxiety disorder, unspecified: Secondary | ICD-10-CM | POA: Diagnosis not present

## 2019-06-23 DIAGNOSIS — F4310 Post-traumatic stress disorder, unspecified: Secondary | ICD-10-CM | POA: Diagnosis not present

## 2019-06-23 DIAGNOSIS — Z1159 Encounter for screening for other viral diseases: Secondary | ICD-10-CM | POA: Diagnosis not present

## 2019-06-23 DIAGNOSIS — F329 Major depressive disorder, single episode, unspecified: Secondary | ICD-10-CM | POA: Diagnosis not present

## 2019-06-23 DIAGNOSIS — E669 Obesity, unspecified: Secondary | ICD-10-CM | POA: Diagnosis not present

## 2019-06-23 DIAGNOSIS — Z8673 Personal history of transient ischemic attack (TIA), and cerebral infarction without residual deficits: Secondary | ICD-10-CM | POA: Diagnosis not present

## 2019-06-23 DIAGNOSIS — M797 Fibromyalgia: Secondary | ICD-10-CM | POA: Diagnosis not present

## 2019-06-23 DIAGNOSIS — D121 Benign neoplasm of appendix: Secondary | ICD-10-CM | POA: Diagnosis not present

## 2019-07-08 DIAGNOSIS — J449 Chronic obstructive pulmonary disease, unspecified: Secondary | ICD-10-CM | POA: Diagnosis not present

## 2019-07-08 DIAGNOSIS — F419 Anxiety disorder, unspecified: Secondary | ICD-10-CM | POA: Diagnosis not present

## 2019-07-27 DIAGNOSIS — F331 Major depressive disorder, recurrent, moderate: Secondary | ICD-10-CM | POA: Diagnosis not present

## 2019-07-27 DIAGNOSIS — J449 Chronic obstructive pulmonary disease, unspecified: Secondary | ICD-10-CM | POA: Diagnosis not present

## 2019-07-27 DIAGNOSIS — F419 Anxiety disorder, unspecified: Secondary | ICD-10-CM | POA: Diagnosis not present

## 2019-08-03 DIAGNOSIS — G43709 Chronic migraine without aura, not intractable, without status migrainosus: Secondary | ICD-10-CM | POA: Diagnosis not present

## 2019-08-03 DIAGNOSIS — R21 Rash and other nonspecific skin eruption: Secondary | ICD-10-CM | POA: Diagnosis not present

## 2019-08-25 DIAGNOSIS — J449 Chronic obstructive pulmonary disease, unspecified: Secondary | ICD-10-CM | POA: Diagnosis not present

## 2019-08-25 DIAGNOSIS — I639 Cerebral infarction, unspecified: Secondary | ICD-10-CM | POA: Diagnosis not present

## 2019-08-26 DIAGNOSIS — Z23 Encounter for immunization: Secondary | ICD-10-CM | POA: Diagnosis not present

## 2019-08-26 DIAGNOSIS — R21 Rash and other nonspecific skin eruption: Secondary | ICD-10-CM | POA: Diagnosis not present

## 2019-09-06 DIAGNOSIS — R21 Rash and other nonspecific skin eruption: Secondary | ICD-10-CM | POA: Diagnosis not present

## 2019-09-29 ENCOUNTER — Ambulatory Visit: Payer: PPO | Admitting: Family Medicine

## 2019-10-04 DIAGNOSIS — R21 Rash and other nonspecific skin eruption: Secondary | ICD-10-CM | POA: Diagnosis not present

## 2019-10-04 DIAGNOSIS — L308 Other specified dermatitis: Secondary | ICD-10-CM | POA: Diagnosis not present

## 2019-10-08 ENCOUNTER — Other Ambulatory Visit: Payer: Self-pay | Admitting: Internal Medicine

## 2019-10-13 ENCOUNTER — Ambulatory Visit: Payer: PPO | Admitting: Internal Medicine

## 2019-10-13 DIAGNOSIS — F419 Anxiety disorder, unspecified: Secondary | ICD-10-CM | POA: Diagnosis not present

## 2019-10-13 DIAGNOSIS — F4312 Post-traumatic stress disorder, chronic: Secondary | ICD-10-CM | POA: Diagnosis not present

## 2019-10-13 NOTE — Progress Notes (Deleted)
Follow-up Outpatient Visit Date: 10/13/2019  Primary Care Provider: Cristy Folks, PA-C [place] [address]  Chief Complaint: ***  HPI:  Melinda Shaw is a 53 y.o. female with history of hypertension, hyperlipidemia, bronchiectasis, fibromyalgia, migraine headaches, and anxiety/depression, who presents for follow-up of chest pain and shortness of breath.  I met her just over a year ago (09/2018), at which time Melinda Shaw reported progressive shortness of breath and chest discomfort.  Subsequent cardiac catheterization was normal without CAD; LVEF and LVEDP were normal.  She was seen for chest pain and syncope at [facility] in 12/2018; based on our prior cardiac workup, it was felt that her symptoms were non-cardiac in nautre.  --------------------------------------------------------------------------------------------------  Cardiovascular History & Procedures: Cardiovascular Problems:  Chest pain  Shortness of breath and history of bronchiectasis  Risk Factors:  Hypertension, hyperlipidemia, and obesity  Cath/PCI:  LHC (09/22/2018): No significant CAD.  Normal LVEF and LVEDP.  CV Surgery:  None  EP Procedures and Devices:  None  Non-Invasive Evaluation(s):  TTE (10/29/2018): Normal LV size and wall thickness.  LVEF 55-60% with normal wall motion.  Grade 1 diastolic dysfunction.  Mild MR.  Normal RV size and function.  Normal PA pressure.  TTE (04/17/2015): Normal LV size and wall thickness.  LVEF 60 to 65% with normal wall motion and diastolic function.  Normal RV size and function.  No significant valvular abnormalities.  Recent CV Pertinent Labs: Lab Results  Component Value Date   BNP 10 02/06/2015   K 4.3 09/16/2018   K 3.1 (L) 02/06/2015   BUN 7 09/16/2018   BUN 14 02/06/2015   CREATININE 0.87 09/16/2018   CREATININE 0.73 02/06/2015    Past medical and surgical history were reviewed and updated in EPIC.  No outpatient medications  have been marked as taking for the 10/13/19 encounter (Appointment) with Margeart Allender, Harrell Gave, MD.    Allergies: Cherry, Shellfish allergy, Sulfa antibiotics, and Bee venom  Social History   Tobacco Use  . Smoking status: Never Smoker  . Smokeless tobacco: Never Used  Substance Use Topics  . Alcohol use: No    Alcohol/week: 0.0 standard drinks  . Drug use: No    Family History  Problem Relation Age of Onset  . COPD Mother   . High Cholesterol Mother   . Heart failure Mother   . Hypertension Mother   . Hypertension Father   . AAA (abdominal aortic aneurysm) Sister   . COPD Sister   . High Cholesterol Sister     Review of Systems: A 12-system review of systems was performed and was negative except as noted in the HPI.  --------------------------------------------------------------------------------------------------  Physical Exam: LMP 09/24/2016 (Approximate)   General:  *** HEENT: No conjunctival pallor or scleral icterus. Facemask in place. Neck: Supple without lymphadenopathy, thyromegaly, JVD, or HJR. Lungs: Normal work of breathing. Clear to auscultation bilaterally without wheezes or crackles. Heart: Regular rate and rhythm without murmurs, rubs, or gallops. Non-displaced PMI. Abd: Bowel sounds present. Soft, NT/ND without hepatosplenomegaly Ext: No lower extremity edema. Radial, PT, and DP pulses are 2+ bilaterally. Skin: Warm and dry without rash.  EKG:  ***  Lab Results  Component Value Date   WBC 7.3 09/16/2018   HGB 12.8 09/16/2018   HCT 39.2 09/16/2018   MCV 91 09/16/2018   PLT 313 09/16/2018    Lab Results  Component Value Date   NA 140 09/16/2018   K 4.3 09/16/2018   CL 102 09/16/2018  CO2 23 09/16/2018   BUN 7 09/16/2018   CREATININE 0.87 09/16/2018   GLUCOSE 112 (H) 09/16/2018   ALT 39 11/21/2015    No results found for: CHOL, HDL, LDLCALC, LDLDIRECT, TRIG, CHOLHDL   --------------------------------------------------------------------------------------------------  ASSESSMENT AND PLAN: ***  Nelva Bush, MD 10/13/2019 6:28 AM

## 2019-10-28 ENCOUNTER — Ambulatory Visit: Payer: PPO | Admitting: Family Medicine

## 2019-10-29 ENCOUNTER — Ambulatory Visit: Payer: PPO | Admitting: Nurse Practitioner

## 2019-11-24 DIAGNOSIS — Z23 Encounter for immunization: Secondary | ICD-10-CM | POA: Diagnosis not present

## 2019-11-24 DIAGNOSIS — R569 Unspecified convulsions: Secondary | ICD-10-CM | POA: Diagnosis not present

## 2019-11-24 DIAGNOSIS — R519 Headache, unspecified: Secondary | ICD-10-CM | POA: Diagnosis not present

## 2019-11-24 DIAGNOSIS — R42 Dizziness and giddiness: Secondary | ICD-10-CM | POA: Diagnosis not present

## 2019-11-24 DIAGNOSIS — R55 Syncope and collapse: Secondary | ICD-10-CM | POA: Diagnosis not present

## 2019-11-29 DIAGNOSIS — Z23 Encounter for immunization: Secondary | ICD-10-CM | POA: Insufficient documentation

## 2019-11-30 DIAGNOSIS — E669 Obesity, unspecified: Secondary | ICD-10-CM | POA: Diagnosis not present

## 2019-11-30 DIAGNOSIS — M797 Fibromyalgia: Secondary | ICD-10-CM | POA: Diagnosis not present

## 2019-12-02 ENCOUNTER — Telehealth: Payer: Self-pay | Admitting: Internal Medicine

## 2019-12-02 NOTE — Telephone Encounter (Signed)
  Triage:  STAT if HR is under 50 or over 120 (normal HR is 60-100 beats per minute)  1) What is your heart rate? 132  2) Do you have a log of your heart rate readings (document readings)? no  Do you have any other symptoms? .  Per patient mychart msg:   Resting heart rate is running high 132 I've been having dizziness and nausea blood pressure was high when I saw dr Melrose Nakayama last week   Scheduling: attempted to schedule lmov

## 2019-12-02 NOTE — Telephone Encounter (Signed)
Patient scheduled to see Dr End tomorrow 12/03/19.

## 2019-12-03 ENCOUNTER — Other Ambulatory Visit: Payer: Self-pay

## 2019-12-03 ENCOUNTER — Other Ambulatory Visit
Admission: RE | Admit: 2019-12-03 | Discharge: 2019-12-03 | Disposition: A | Discharge status: Home / Self Care | Payer: PPO | Source: Ambulatory Visit | Attending: Internal Medicine | Admitting: Internal Medicine

## 2019-12-03 ENCOUNTER — Encounter: Payer: Self-pay | Admitting: Internal Medicine

## 2019-12-03 ENCOUNTER — Ambulatory Visit (INDEPENDENT_AMBULATORY_CARE_PROVIDER_SITE_OTHER): Payer: PPO | Admitting: Internal Medicine

## 2019-12-03 VITALS — BP 165/98 | HR 74 | Ht 62.0 in | Wt 192.8 lb

## 2019-12-03 DIAGNOSIS — R0602 Shortness of breath: Secondary | ICD-10-CM | POA: Insufficient documentation

## 2019-12-03 DIAGNOSIS — R079 Chest pain, unspecified: Secondary | ICD-10-CM | POA: Diagnosis not present

## 2019-12-03 DIAGNOSIS — R002 Palpitations: Secondary | ICD-10-CM | POA: Diagnosis not present

## 2019-12-03 DIAGNOSIS — I10 Essential (primary) hypertension: Secondary | ICD-10-CM | POA: Diagnosis not present

## 2019-12-03 LAB — COMPREHENSIVE METABOLIC PANEL
ALT: 47 U/L — ABNORMAL HIGH (ref 0–44)
AST: 35 U/L (ref 15–41)
Albumin: 3.7 g/dL (ref 3.5–5.0)
Alkaline Phosphatase: 145 U/L — ABNORMAL HIGH (ref 38–126)
Anion gap: 10 (ref 5–15)
BUN: 12 mg/dL (ref 6–20)
CO2: 22 mmol/L (ref 22–32)
Calcium: 9.3 mg/dL (ref 8.9–10.3)
Chloride: 108 mmol/L (ref 98–111)
Creatinine, Ser: 0.88 mg/dL (ref 0.44–1.00)
GFR calc Af Amer: >60 mL/min (ref >60)
GFR calc non Af Amer: >60 mL/min (ref >60)
Glucose, Bld: 121 mg/dL — ABNORMAL HIGH (ref 70–99)
Potassium: 3.8 mmol/L (ref 3.5–5.1)
Sodium: 140 mmol/L (ref 135–145)
Total Bilirubin: 0.5 mg/dL (ref 0.3–1.2)
Total Protein: 7.5 g/dL (ref 6.5–8.1)

## 2019-12-03 LAB — CBC
HCT: 39.5 % (ref 36.0–46.0)
Hemoglobin: 12.9 g/dL (ref 12.0–15.0)
MCH: 30.9 pg (ref 26.0–34.0)
MCHC: 32.7 g/dL (ref 30.0–36.0)
MCV: 94.5 fL (ref 80.0–100.0)
Platelets: 256 10*3/uL (ref 150–400)
RBC: 4.18 MIL/uL (ref 3.87–5.11)
RDW: 13.1 % (ref 11.5–15.5)
WBC: 8.8 10*3/uL (ref 4.0–10.5)
nRBC: 0 % (ref 0.0–0.2)

## 2019-12-03 LAB — TSH: TSH: 2.429 u[IU]/mL (ref 0.350–4.500)

## 2019-12-03 MED ORDER — AMLODIPINE BESYLATE 5 MG PO TABS: 5.0000 mg | ORAL_TABLET | Freq: Every day | ORAL | 5 refills | Status: DC

## 2019-12-03 NOTE — Progress Notes (Signed)
Follow-up Outpatient Visit Date: 12/03/2019  Primary Care Provider: Cristy Folks, PA-C [place] [id number]  Chief Complaint: Elevated heart rate and blood pressure  HPI:  Melinda Shaw is a 54 y.o. female with history of hypertension, hyperlipidemia, bronchiectasis, fibromyalgia, migraine headaches, and anxiety/depression, who presents for follow-up of shortness of breath and chest pain.  She was last seen in our office in 11/2018 by Ignacia Bayley, NP, shortly after catheterization in 09/2018 that showed no significant CAD.  She was subsequently seen by [facility] cardiology after a syncopal episode in 12/2018 (she believes this was actually for preop evaluation).  Melinda Shaw has noticed elevated resting heart rates (over 100 bpm) over the last month.  He blood pressure has also been elevated.  This seems to be happening on a daily basis.  She also notes associated shortness of breath and dizziness when her heart rate is elevated.  She is aware of her heart beating faster than expected when this happens, with episodes usually lasting about an hour.  She denies frank chest pain, though on a couple of occassions, she has felt as though she may need to take NTG (she has not actually used it).  She has not had any exertional symptoms.  Isosorbide mononitrate was discontinued at some point (she is unsure if this was at the time of her cardiology visit at [facility] in 12/2018 or following abdominal surgery at [place] in the summer).  She notes that isosorbide mononitrate seemed to be worsening her headaches.  She denies orthopnea, PND, and edema.  She consumes one Pepsi per day but otherwise does not take in caffeine.  --------------------------------------------------------------------------------------------------  Cardiovascular History & Procedures: Cardiovascular Problems:  Chest pain  Shortness of breath and history of bronchiectasis  Risk Factors:  Hypertension, hyperlipidemia, and  obesity  Cath/PCI:  LHC (09/22/2018): No angiographically significant coronary artery disease.  Normal LVEF and LVEDP.  CV Surgery:  None  EP Procedures and Devices:  14-day event monitor (02/02/2019, [facility]): Predominantly sinus rhythm with rare PACs and PVCs.  No sustained arrhythmia or prolonged pause.  Patient triggered symptoms correspond to sinus rhythm.  Non-Invasive Evaluation(s):  TTE (10/29/2018): Normal LV size.  LVEF 55-60% with normal wall motion.  Grade 1 diastolic dysfunction.  Mild mitral regurgitation.  Normal RV size and function.  Normal PA pressure.  TTE (04/17/2015): Normal LV size and wall thickness.  LVEF 60 to 65% with normal wall motion and diastolic function.  Normal RV size and function.  No significant valvular abnormalities.  Recent CV Pertinent Labs: Lab Results  Component Value Date   BNP 10 02/06/2015   K 4.3 09/16/2018   K 3.1 (L) 02/06/2015   BUN 7 09/16/2018   BUN 14 02/06/2015   CREATININE 0.87 09/16/2018   CREATININE 0.73 02/06/2015    Past medical and surgical history were reviewed and updated in EPIC.  Current Meds  Medication Sig  . albuterol (PROVENTIL HFA;VENTOLIN HFA) 108 (90 BASE) MCG/ACT inhaler Inhale 2 puffs into the lungs every 6 (six) hours as needed for wheezing or shortness of breath.  . cyclobenzaprine (FLEXERIL) 10 MG tablet Take 1 tablet (10 mg total) by mouth 3 (three) times daily as needed for muscle spasms. (Patient taking differently: Take 10 mg by mouth at bedtime. )  . EPINEPHrine (EPIPEN 2-PAK) 0.3 mg/0.3 mL IJ SOAJ injection Inject 0.3 mLs (0.3 mg total) into the muscle once. (Patient taking differently: Inject 0.3 mg into the muscle daily as needed (anaphylactic  allergic reactions.). )  . gabapentin (NEURONTIN) 100 MG capsule Take 200 mg by mouth at bedtime.   . hydrOXYzine (VISTARIL) 25 MG capsule Take 25 mg by mouth 3 (three) times daily.  [name] lithium carbonate 300 MG capsule Take 600 mg by mouth at bedtime.  .  mirtazapine (REMERON) 30 MG tablet Take 30 mg by mouth at bedtime.  . mometasone-formoterol (DULERA) 200-5 MCG/ACT AERO Inhale 2 puffs into the lungs 2 (two) times daily.  . nitroGLYCERIN (NITROSTAT) 0.4 MG SL tablet PLACE ONE TABLET UNDER THE TONGUE EVERY 5 MINUTES AS NEEDED FOR CHEST PAIN. MAXIMUM OF 3 DOSES.  [name] omeprazole (PRILOSEC) 40 MG capsule Take 40 mg by mouth 2 (two) times daily.   . ondansetron (ZOFRAN ODT) 4 MG disintegrating tablet Take 1 tablet (4 mg total) by mouth every 8 (eight) hours as needed for nausea or vomiting.  . propranolol ER (INDERAL LA) 80 MG 24 hr capsule Take 80 mg by mouth at bedtime.  [name] tiZANidine (ZANAFLEX) 4 MG tablet Take 4 mg by mouth at bedtime.   [name] venlafaxine XR (EFFEXOR-XR) 150 MG 24 hr capsule Take 150 mg by mouth daily.  [name] zolpidem (AMBIEN) 10 MG tablet Take 10 mg by mouth at bedtime.  . [DISCONTINUED] isosorbide mononitrate (IMDUR) 30 MG 24 hr tablet Take 1 tablet (30 mg total) by mouth daily.    Allergies: Cherry, Shellfish allergy, Sulfa antibiotics, and Bee venom  Social History   Tobacco Use  . Smoking status: Never Smoker  . Smokeless tobacco: Never Used  Substance Use Topics  . Alcohol use: No    Alcohol/week: 0.0 standard drinks  . Drug use: No    Family History  Problem Relation Age of Onset  . COPD Mother   . High Cholesterol Mother   . Heart failure Mother   . Hypertension Mother   . Hypertension Father   . AAA (abdominal aortic aneurysm) Sister   . COPD Sister   . High Cholesterol Sister     Review of Systems: A 12-system review of systems was performed and was negative except as noted in the HPI.  --------------------------------------------------------------------------------------------------  Physical Exam: BP (!) 165/98 (BP Location: Left Arm, Patient Position: Sitting, Cuff Size: Large)   Pulse 74   Ht 5\' 2"  (1.575 m)   Wt 192 lb 12 oz (87.4 kg)   LMP 09/24/2016 (Approximate)   SpO2 96%   BMI 35.25 kg/m    General:  NAD. HEENT: No conjunctival pallor or scleral icterus. Facemask in place. Neck: Supple without lymphadenopathy, thyromegaly, JVD, or HJR. Lungs: Normal work of breathing. Clear to auscultation bilaterally without wheezes or crackles. Heart: Regular rate and rhythm without murmurs, rubs, or gallops. Non-displaced PMI. Abd: Bowel sounds present. Soft, NT/ND without hepatosplenomegaly Ext: No lower extremity edema. Radial, PT, and DP pulses are 2+ bilaterally. Skin: Warm and dry without rash.  EKG:  NSR with nonspecific ST segment changes.  Inferior ST depressions are slightly more pronounced than in 2018-12-25 but similar to tracing form 11/16/17.  Lab Results  Component Value Date   WBC 7.3 09/16/2018   HGB 12.8 09/16/2018   HCT 39.2 09/16/2018   MCV 91 09/16/2018   PLT 313 09/16/2018    Lab Results  Component Value Date   NA 140 09/16/2018   K 4.3 09/16/2018   CL 102 09/16/2018   CO2 23 09/16/2018   BUN 7 09/16/2018   CREATININE 0.87 09/16/2018   GLUCOSE 112 (H) 09/16/2018   ALT 39 11/21/2015  No results found for: CHOL, HDL, LDLCALC, LDLDIRECT, TRIG, CHOLHDL  --------------------------------------------------------------------------------------------------  ASSESSMENT AND PLAN: Palpitations: Heart rate seems to have been elevated for at least a month per Melinda Shaw (it is normal today).  Prior event monitor through [facility] last year showed NSR with rare PAC's and PVC's.  I have recommended that we check a CBC, TSH, and CMP today.  Melinda Shaw should continue her current dose of propranolol and minimize her caffeine intake.  Hypertension: Blood pressure suboptimally controlled.  We have agreed to add amlodipine 5 mg daily for blood pressure and antianginal therapy.  Chest pain and shortness of breath: Intermittent with catheterization in 09/2018 showing clean coronary arteries.  EKG today shows slightly more pronounced inferior ST depressions.  We previously had  concern for coronary vasospasm and/or microvascular dysfunction, for which isosorbide mononitrate was prescribed.  Unfortunately, Melinda Shaw did not tolerate this due to headaches.  We have therefore agreed to a trial of amlodipine 5 mg daily.  We will also continue her current dose of propranolol (also used for headaches and BP control).  I will check a CBC and CMP today.  Follow-up: Return to clinic in 1 month.  Nelva Bush, MD 12/03/2019 3:55 PM

## 2019-12-03 NOTE — Patient Instructions (Signed)
Medication Instructions:  Your physician has recommended you make the following change in your medication:   START Amlodipine 5 mg daily. An Rx has been sent to your pharmacy.  *If you need a refill on your cardiac medications before your next appointment, please call your pharmacy*  Lab Work: Cbc, Tsh, Cmp  Please have your lab work drawn today upstairs at the medical mall.  If you have labs (blood work) drawn today and your tests are completely normal, you will receive your results only by: [name] MyChart Message (if you have MyChart) OR . A paper copy in the mail If you have any lab test that is abnormal or we need to change your treatment, we will call you to review the results.  Testing/Procedures: None ordered  Follow-Up: At [facility], you and your health needs are our priority.  As part of our continuing mission to provide you with exceptional heart care, we have created designated Provider Care Teams.  These Care Teams include your primary Cardiologist (physician) and Advanced Practice Providers (APPs -  Physician Assistants and Nurse Practitioners) who all work together to provide you with the care you need, when you need it.  Your next appointment:   4 week(s)  The format for your next appointment:   In Person  Provider:    You may see  one of the following Advanced Practice Providers on your designated Care Team:    Murray Hodgkins, NP  Christell Faith, PA-C  Marrianne Mood, PA-C   Other Instructions N/A

## 2019-12-04 ENCOUNTER — Encounter: Payer: Self-pay | Admitting: Internal Medicine

## 2019-12-04 DIAGNOSIS — R0789 Other chest pain: Secondary | ICD-10-CM | POA: Insufficient documentation

## 2019-12-04 DIAGNOSIS — R002 Palpitations: Secondary | ICD-10-CM | POA: Insufficient documentation

## 2019-12-04 DIAGNOSIS — I10 Essential (primary) hypertension: Secondary | ICD-10-CM | POA: Insufficient documentation

## 2019-12-04 DIAGNOSIS — R079 Chest pain, unspecified: Secondary | ICD-10-CM | POA: Insufficient documentation

## 2019-12-20 DIAGNOSIS — J449 Chronic obstructive pulmonary disease, unspecified: Secondary | ICD-10-CM | POA: Diagnosis not present

## 2019-12-20 DIAGNOSIS — G8929 Other chronic pain: Secondary | ICD-10-CM | POA: Diagnosis not present

## 2019-12-30 ENCOUNTER — Encounter: Payer: Self-pay | Admitting: Family

## 2019-12-31 ENCOUNTER — Encounter: Payer: Self-pay | Admitting: Family Medicine

## 2019-12-31 ENCOUNTER — Ambulatory Visit (INDEPENDENT_AMBULATORY_CARE_PROVIDER_SITE_OTHER): Payer: PPO | Admitting: Family Medicine

## 2019-12-31 VITALS — BP 120/78 | HR 71 | Ht 62.0 in | Wt 185.5 lb

## 2019-12-31 DIAGNOSIS — R079 Chest pain, unspecified: Secondary | ICD-10-CM

## 2019-12-31 DIAGNOSIS — R0602 Shortness of breath: Secondary | ICD-10-CM | POA: Diagnosis not present

## 2019-12-31 DIAGNOSIS — I10 Essential (primary) hypertension: Secondary | ICD-10-CM

## 2019-12-31 NOTE — Progress Notes (Addendum)
Cardiology Office Note  Date: 12/31/2019   ID: Melinda Shaw, DOB 24-Jun-1966, MRN [id number]  PCP:  Cristy Folks, PA-C  Cardiologist:  Nelva Bush, MD Electrophysiologist:  None   Chief Complaint  Patient presents with  . other    4 week follow up. Meds reviewed by the pt. verbally. Pt. c/o chest tightness, elevated BP and rapid heart beats.     History of Present Illness: Melinda Shaw is a 54 y.o. female with a history of HTN, HLD,Mig.H/A, Fibromylagia, bronchiectasis, anxiety and depression, palpitations, sob and chest pain.cardiac catheterization 09/2018 with no significant CAD. Syncopal episode 12/2018. Hx of resting HR > 100 bpm  prior to previous visit on 1/22/2021a/w dizziness and SOB on daily basis.  Patient was intolerant to Imdur in the past. Amlodipine 5mg  started at last visit d/t suspicion of vasospastic coronary arterial disease.  Patient presents today stating she is doing well.  States occasional chest pressure/tightness which appears to be occasionally increased by exertional activity.  However she states at other times during exertional activities she has no chest pain or tightness.  States her blood pressure is doing much better since starting the amlodipine.  States she is having less episodes of palpitations.  Maybe once or twice a week which are short-lived.  She is asymptomatic when these occur.  Past Medical History:  Diagnosis Date  . Anemia    H/O  . Anxiety   . Bronchitis   . Chest pain    a. 09/2018 Cath: Nl cors. EF 65%->Med rx (long-acting nitrate added).  . Depression   . Diastolic dysfunction    a. 10/2018 Echo: EF 55-60%, no rwma, Gr1DD. Mild MR. Nl RV fxn.  . DOE (dyspnea on exertion)   . Fibromyalgia   . GERD (gastroesophageal reflux disease)   . Headache   . Hyperlipidemia   . Hypertension    no meds  . Palpitations    a. 01/2019 [facility]): NSR w/ rare PACs & PVCs.  [name] PONV (postoperative nausea and vomiting)       Past Surgical History:  Procedure Laterality Date  . ANTERIOR CRUCIATE LIGAMENT REPAIR    . CARDIAC CATHETERIZATION    . CHOLECYSTECTOMY    . CORONARY ANGIOPLASTY    . ESOPHAGOGASTRODUODENOSCOPY (EGD) WITH PROPOFOL N/A 08/28/2015   Procedure: ESOPHAGOGASTRODUODENOSCOPY (EGD) WITH PROPOFOL;  Surgeon: Josefine Class, MD;  Location: [facility] ENDOSCOPY;  Service: Endoscopy;  Laterality: N/A;  . KNEE SURGERY     left knee   . LEFT HEART CATH AND CORONARY ANGIOGRAPHY Left 09/22/2018   Procedure: LEFT HEART CATH AND CORONARY ANGIOGRAPHY;  Surgeon: Nelva Bush, MD;  Location: [place] CV LAB;  Service: Cardiovascular;  Laterality: Left;  [name] MASS EXCISION Left 01/26/2016   Procedure: EXCISION OF LEFT WRIST VOLAR MASS;  Surgeon: Charlotte Crumb, MD;  Location: [place];  Service: Orthopedics;  Laterality: Left;  . NOSE SURGERY    . POLYPECTOMY    . TUBAL LIGATION    . VIDEO BRONCHOSCOPY N/A 05/29/2015   Procedure: VIDEO BRONCHOSCOPY WITHOUT FLUORO;  Surgeon: Vilinda Boehringer, MD;  Location: [facility] ORS;  Service: Cardiopulmonary;  Laterality: N/A;    Current Outpatient Medications  Medication Sig Dispense Refill  . albuterol (PROVENTIL HFA;VENTOLIN HFA) 108 (90 BASE) MCG/ACT inhaler Inhale 2 puffs into the lungs every 6 (six) hours as needed for wheezing or shortness of breath.    [name] amLODipine (NORVASC) 5 MG tablet Take 1 tablet (5 mg  total) by mouth daily. 30 tablet 5  . clonazePAM (KLONOPIN) 0.5 MG tablet Take 0.5 mg by mouth 2 (two) times daily as needed.    . cyclobenzaprine (FLEXERIL) 10 MG tablet Take 1 tablet (10 mg total) by mouth 3 (three) times daily as needed for muscle spasms. (Patient taking differently: Take 10 mg by mouth at bedtime. ) 21 tablet 0  . eletriptan (RELPAX) 20 MG tablet Take 20 mg by mouth every 2 (two) hours as needed for migraine.     [name] EPINEPHrine (EPIPEN 2-PAK) 0.3 mg/0.3 mL IJ SOAJ injection Inject 0.3 mLs (0.3 mg total) into the muscle  once. (Patient taking differently: Inject 0.3 mg into the muscle daily as needed (anaphylactic allergic reactions.). ) 1 Device 1  . gabapentin (NEURONTIN) 100 MG capsule Take 200 mg by mouth at bedtime.     . hydrOXYzine (VISTARIL) 25 MG capsule Take 25 mg by mouth 3 (three) times daily.    [name] lithium carbonate 300 MG capsule Take 600 mg by mouth at bedtime.    . medroxyPROGESTERone (PROVERA) 5 MG tablet TAKE ONE TABLET BY MOUTH DAILY 30 tablet 2  . mirtazapine (REMERON) 30 MG tablet Take 30 mg by mouth at bedtime.    . mometasone-formoterol (DULERA) 200-5 MCG/ACT AERO Inhale 2 puffs into the lungs 2 (two) times daily. 1 Inhaler 1  . nitroGLYCERIN (NITROSTAT) 0.4 MG SL tablet PLACE ONE TABLET UNDER THE TONGUE EVERY 5 MINUTES AS NEEDED FOR CHEST PAIN. MAXIMUM OF 3 DOSES. 25 tablet 0  . omeprazole (PRILOSEC) 40 MG capsule Take 40 mg by mouth 2 (two) times daily.     . ondansetron (ZOFRAN ODT) 4 MG disintegrating tablet Take 1 tablet (4 mg total) by mouth every 8 (eight) hours as needed for nausea or vomiting. 20 tablet 0  . propranolol ER (INDERAL LA) 80 MG 24 hr capsule Take 80 mg by mouth at bedtime.    [name] tiZANidine (ZANAFLEX) 4 MG tablet Take 4 mg by mouth at bedtime.     [name] venlafaxine XR (EFFEXOR-XR) 150 MG 24 hr capsule Take 150 mg by mouth daily.    [name] zolpidem (AMBIEN) 10 MG tablet Take 10 mg by mouth at bedtime.    [name] acetaminophen-codeine (TYLENOL #3) 300-30 MG tablet Take 1 tablet by mouth every 8 (eight) hours as needed for moderate pain. (Patient not taking: Reported on 12/31/2019) 10 tablet 0   No current facility-administered medications for this visit.   Allergies:  Cherry, Shellfish allergy, Sulfa antibiotics, and Bee venom   Social History: The patient  reports that she has never smoked. She has never used smokeless tobacco. She reports that she does not drink alcohol or use drugs.   Family History: The patient's family history includes AAA (abdominal aortic aneurysm) in her sister;  COPD in her mother and sister; Heart failure in her mother; High Cholesterol in her mother and sister; Hypertension in her father and mother.   ROS:  Please see the history of present illness. Otherwise, complete review of systems is positive for none.  All other systems are reviewed and negative.   Physical Exam: VS:  BP 120/78 (BP Location: Left Arm, Patient Position: Sitting, Cuff Size: Large)   Pulse 71   Ht 5\' 2"  (1.575 m)   Wt 185 lb 8 oz (84.1 kg)   LMP 09/24/2016 (Approximate)   SpO2 99%   BMI 33.93 kg/m , BMI Body mass index is 33.93 kg/m.  Wt Readings from Last 3 Encounters:  12/31/19 185 lb 8 oz (84.1 kg)  12/03/19 192 lb 12 oz (87.4 kg)  03/26/19 192 lb 1.6 oz (87.1 kg)    General: Patient appears comfortable at rest. Neck: Supple, no elevated JVP or carotid bruits, no thyromegaly. Lungs: Clear to auscultation, nonlabored breathing at rest. Cardiac: Regular rate and rhythm, no S3 or significant systolic murmur, no pericardial rub. Extremities: No pitting edema, distal pulses 2+. Skin: Warm and dry. Musculoskeletal: No kyphosis. Neuropsychiatric: Alert and oriented x3, affect grossly appropriate.  ECG:  An ECG dated 12/31/2019 was personally reviewed today and demonstrated:  Normal sinus rhythm rate of 71, no ST or T wave abnormalities, normal axis, no hypertrophy  Recent Labwork: 12/03/2019: ALT 47; AST 35; BUN 12; Creatinine, Ser 0.88; Hemoglobin 12.9; Platelets 256; Potassium 3.8; Sodium 140; TSH 2.429  No results found for: CHOL, TRIG, HDL, CHOLHDL, VLDL, LDLCALC, LDLDIRECT  Other Studies Reviewed Today:  Echocardiogram 10/29/2018 Study Conclusions   - Left ventricle: The cavity size was normal. Systolic function was  normal. The estimated ejection fraction was in the range of 55%  to 60%. Wall motion was normal; there were no regional wall  motion abnormalities. Doppler parameters are consistent with  abnormal left ventricular relaxation (grade 1  diastolic  dysfunction).  - Mitral valve: There was mild regurgitation.  - Left atrium: The atrium was normal in size.  - Right ventricle: Systolic function was normal.  - Pulmonary arteries: Systolic pressure was within the normal  range.    Cardiac catheterization 09/22/2018 Conclusions: 1. No angiographically significant coronary artery disease. 2. Normal left ventricular systolic function and filling pressure.  Recommendations:  1. Continue medical therapy, including propranolol and isosorbide mononitrate in case there is an element of microvascular dysfunction and/or coronary vasospasm. 2. Primary prevention of coronary artery disease. No indication for antiplatelet therapy at this time.   Assessment and Plan:  1. Essential hypertension   2. Chest pain, unspecified type   3. SOB (shortness of breath)    1. Essential hypertension Blood pressure is much improved since starting amlodipine.  Previous visit blood pressure 165/98.  Today's blood pressure 120/78.  Continue amlodipine 5 mg daily  2. Chest pain, unspecified type Patient states chest pain has improved some but she continues to have episodes of chest pressure/chest tightness during exertional activities.  She states these episodes are not predictable and do not occur every time she exerts.  She states the intensity is less since starting the amlodipine.  Continue nitroglycerin sublingual as needed. Patient states she stopped her Imdur due to increasing headaches.  3. SOB (shortness of breath)  PFTs notable for spirometry Mild Restrictive Lung disease with +BD response findings suggest underlying reactive airways disease.  Patient denies ever having smoked.  She denies any significant exertional dyspnea.  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  She uses albuterol inhaler, Dulera metered-dose inhaler.  Medication Adjustments/Labs and Tests Ordered: Current medicines are reviewed at length with  the patient today.  Concerns regarding medicines are outlined above.    Signed, Levell July, NP 12/31/2019 4:39 PM    [facility]  Medication Adjustments/Labs and Tests Ordered: Current medicines are reviewed at length with the patient today.  Concerns regarding medicines are outlined above.    Signed, Levell July, NP 12/31/2019 4:39 PM    [facility]

## 2019-12-31 NOTE — Patient Instructions (Signed)
Medication Instructions:  Your physician recommends that you continue on your current medications as directed. Please refer to the Current Medication list given to you today.  *If you need a refill on your cardiac medications before your next appointment, please call your pharmacy*  Lab Work: None ordered  If you have labs (blood work) drawn today and your tests are completely normal, you will receive your results only by: [name] MyChart Message (if you have MyChart) OR . A paper copy in the mail If you have any lab test that is abnormal or we need to change your treatment, we will call you to review the results.  Testing/Procedures: None ordered   Follow-Up: At [facility], you and your health needs are our priority.  As part of our continuing mission to provide you with exceptional heart care, we have created designated Provider Care Teams.  These Care Teams include your primary Cardiologist (physician) and Advanced Practice Providers (APPs -  Physician Assistants and Nurse Practitioners) who all work together to provide you with the care you need, when you need it.  Your next appointment:   6 month(s)  The format for your next appointment:   In Person  Provider:    You may see Nelva Bush, MD or one of the following Advanced Practice Providers on your designated Care Team:    Murray Hodgkins, NP  Christell Faith, PA-C  Marrianne Mood, PA-C

## 2020-03-07 DIAGNOSIS — G8929 Other chronic pain: Secondary | ICD-10-CM | POA: Diagnosis not present

## 2020-03-07 DIAGNOSIS — R102 Pelvic and perineal pain: Secondary | ICD-10-CM | POA: Diagnosis not present

## 2020-03-07 DIAGNOSIS — C189 Malignant neoplasm of colon, unspecified: Secondary | ICD-10-CM | POA: Diagnosis not present

## 2020-03-07 DIAGNOSIS — Z1211 Encounter for screening for malignant neoplasm of colon: Secondary | ICD-10-CM | POA: Diagnosis not present

## 2020-03-29 DIAGNOSIS — J449 Chronic obstructive pulmonary disease, unspecified: Secondary | ICD-10-CM | POA: Diagnosis not present

## 2020-03-29 DIAGNOSIS — F419 Anxiety disorder, unspecified: Secondary | ICD-10-CM | POA: Diagnosis not present

## 2020-04-11 DIAGNOSIS — K769 Liver disease, unspecified: Secondary | ICD-10-CM | POA: Diagnosis not present

## 2020-04-11 DIAGNOSIS — R1031 Right lower quadrant pain: Secondary | ICD-10-CM | POA: Diagnosis not present

## 2020-04-11 DIAGNOSIS — N200 Calculus of kidney: Secondary | ICD-10-CM | POA: Diagnosis not present

## 2020-04-11 DIAGNOSIS — K5730 Diverticulosis of large intestine without perforation or abscess without bleeding: Secondary | ICD-10-CM | POA: Diagnosis not present

## 2020-04-20 DIAGNOSIS — F419 Anxiety disorder, unspecified: Secondary | ICD-10-CM | POA: Diagnosis not present

## 2020-04-20 DIAGNOSIS — F331 Major depressive disorder, recurrent, moderate: Secondary | ICD-10-CM | POA: Diagnosis not present

## 2020-04-26 ENCOUNTER — Telehealth: Payer: Self-pay | Admitting: Internal Medicine

## 2020-04-26 NOTE — Telephone Encounter (Signed)
  Patient Consent for Virtual Visit         [name] has provided verbal consent on 04/26/2020 for a virtual visit (video or telephone).   CONSENT FOR VIRTUAL VISIT FOR:  Melinda Shaw  By participating in this virtual visit I agree to the following:  I hereby voluntarily request, consent and authorize [place] and its employed or contracted physicians, physician assistants, nurse practitioners or other licensed health care professionals (the Practitioner), to provide me with telemedicine health care services (the "Services") as deemed necessary by the treating Practitioner. I acknowledge and consent to receive the Services by the Practitioner via telemedicine. I understand that the telemedicine visit will involve communicating with the Practitioner through live audiovisual communication technology and the disclosure of certain medical information by electronic transmission. I acknowledge that I have been given the opportunity to request an in-person assessment or other available alternative prior to the telemedicine visit and am voluntarily participating in the telemedicine visit.  I understand that I have the right to withhold or withdraw my consent to the use of telemedicine in the course of my care at any time, without affecting my right to future care or treatment, and that the Practitioner or I may terminate the telemedicine visit at any time. I understand that I have the right to inspect all information obtained and/or recorded in the course of the telemedicine visit and may receive copies of available information for a reasonable fee.  I understand that some of the potential risks of receiving the Services via telemedicine include:  [name] Delay or interruption in medical evaluation due to technological equipment failure or disruption; . Information transmitted may not be sufficient (e.g. poor resolution of images) to allow for appropriate medical decision making by the  Practitioner; and/or  . In rare instances, security protocols could fail, causing a breach of personal health information.  Furthermore, I acknowledge that it is my responsibility to provide information about my medical history, conditions and care that is complete and accurate to the best of my ability. I acknowledge that Practitioner's advice, recommendations, and/or decision may be based on factors not within their control, such as incomplete or inaccurate data provided by me or distortions of diagnostic images or specimens that may result from electronic transmissions. I understand that the practice of medicine is not an exact science and that Practitioner makes no warranties or guarantees regarding treatment outcomes. I acknowledge that a copy of this consent can be made available to me via my patient portal ([facility]), or I can request a printed copy by calling the office of [place].    I understand that my insurance will be billed for this visit.   I have read or had this consent read to me. . I understand the contents of this consent, which adequately explains the benefits and risks of the Services being provided via telemedicine.  . I have been provided ample opportunity to ask questions regarding this consent and the Services and have had my questions answered to my satisfaction. . I give my informed consent for the services to be provided through the use of telemedicine in my medical care

## 2020-04-27 ENCOUNTER — Encounter: Payer: Self-pay | Admitting: Internal Medicine

## 2020-04-27 ENCOUNTER — Telehealth (INDEPENDENT_AMBULATORY_CARE_PROVIDER_SITE_OTHER): Payer: PPO | Admitting: Internal Medicine

## 2020-04-27 VITALS — BP 126/76 | HR 60 | Ht 62.0 in | Wt 170.0 lb

## 2020-04-27 DIAGNOSIS — I10 Essential (primary) hypertension: Secondary | ICD-10-CM

## 2020-04-27 DIAGNOSIS — R079 Chest pain, unspecified: Secondary | ICD-10-CM

## 2020-04-27 DIAGNOSIS — R002 Palpitations: Secondary | ICD-10-CM | POA: Diagnosis not present

## 2020-04-27 NOTE — Progress Notes (Signed)
Virtual Visit via Telephone Note   This visit type was conducted due to national recommendations for restrictions regarding the COVID-19 Pandemic (e.g. social distancing) in an effort to limit this patient's exposure and mitigate transmission in our community.  Due to her co-morbid illnesses, this patient is at least at moderate risk for complications without adequate follow up.  This format is felt to be most appropriate for this patient at this time.  The patient did not have access to video technology/had technical difficulties with video requiring transitioning to audio format only (telephone).  All issues noted in this document were discussed and addressed.  No physical exam could be performed with this format.  Please refer to the patient's chart for her  consent to telehealth for [facility].   The patient was identified using 2 identifiers.  Date:  04/27/2020   ID:  [name], DOB [birth year], MRN [id number]  Patient Location: Home Provider Location: Home  PCP:  Cristy Folks, PA-C  Cardiologist:  Nelva Bush, MD  Electrophysiologist:  None   Evaluation Performed:  Follow-Up Visit  Chief Complaint:  Palpitations  History of Present Illness:    Melinda Shaw is a 54 y.o. female with history of hypertension, hyperlipidemia, bronchiectasis, fibromyalgia, migraine headaches, and anxiety/depression.  She was last seen in our office in 12/2019 by Levell July, NP, at which time she reported improvement in chest pain and palpitations following addition of amlodipine. No medication changes were made.  Today, Ms. Stanforth reports that she has been experiencing more frequent palpitations, often several times per day.  She describes both skipped beats and flutters with associated dizziness.  She also reports occasional tightness in her chest, usually right after she feels the palpitations.  She denies shortness of breath.  She has stable mild swelling in her ankles  and wrists.  Heart rates at home are typically in the low 60's with blood pressure readings similar to today's.  The patient does not have symptoms concerning for COVID-19 infection (fever, chills, cough, or new shortness of breath).    Past Medical History:  Diagnosis Date  . Anemia    H/O  . Anxiety   . Bronchitis   . Chest pain    a. 09/2018 Cath: Nl cors. EF 65%->Med rx (long-acting nitrate added).  . Depression   . Diastolic dysfunction    a. 10/2018 Echo: EF 55-60%, no rwma, Gr1DD. Mild MR. Nl RV fxn.  . DOE (dyspnea on exertion)   . Fibromyalgia   . GERD (gastroesophageal reflux disease)   . Headache   . Hyperlipidemia   . Hypertension    no meds  . Palpitations    a. 01/2019 [facility]): NSR w/ rare PACs & PVCs.  [name] PONV (postoperative nausea and vomiting)    Past Surgical History:  Procedure Laterality Date  . ANTERIOR CRUCIATE LIGAMENT REPAIR    . CARDIAC CATHETERIZATION    . CHOLECYSTECTOMY    . CORONARY ANGIOPLASTY    . ESOPHAGOGASTRODUODENOSCOPY (EGD) WITH PROPOFOL N/A 08/28/2015   Procedure: ESOPHAGOGASTRODUODENOSCOPY (EGD) WITH PROPOFOL;  Surgeon: Josefine Class, MD;  Location: [facility] ENDOSCOPY;  Service: Endoscopy;  Laterality: N/A;  . KNEE SURGERY     left knee   . LEFT HEART CATH AND CORONARY ANGIOGRAPHY Left 09/22/2018   Procedure: LEFT HEART CATH AND CORONARY ANGIOGRAPHY;  Surgeon: Nelva Bush, MD;  Location: [facility];  Service: Cardiovascular;  Laterality: Left;  [name] MASS EXCISION Left 01/26/2016   Procedure:  EXCISION OF LEFT WRIST VOLAR MASS;  Surgeon: Charlotte Crumb, MD;  Location: [facility];  Service: Orthopedics;  Laterality: Left;  . NOSE SURGERY    . POLYPECTOMY    . TUBAL LIGATION    . VIDEO BRONCHOSCOPY N/A 05/29/2015   Procedure: VIDEO BRONCHOSCOPY WITHOUT FLUORO;  Surgeon: Vilinda Boehringer, MD;  Location: [facility] ORS;  Service: Cardiopulmonary;  Laterality: N/A;     Current Meds  Medication Sig  . albuterol  (PROVENTIL HFA;VENTOLIN HFA) 108 (90 BASE) MCG/ACT inhaler Inhale 2 puffs into the lungs every 6 (six) hours as needed for wheezing or shortness of breath.  [name] amLODipine (NORVASC) 5 MG tablet Take 1 tablet (5 mg total) by mouth daily.  . clonazePAM (KLONOPIN) 0.5 MG tablet Take 0.5 mg by mouth 2 (two) times daily as needed.  . cyclobenzaprine (FLEXERIL) 10 MG tablet Take 1 tablet (10 mg total) by mouth 3 (three) times daily as needed for muscle spasms. (Patient taking differently: Take 10 mg by mouth at bedtime. )  . eletriptan (RELPAX) 20 MG tablet Take 20 mg by mouth every 2 (two) hours as needed for migraine.   [name] EPINEPHrine (EPIPEN 2-PAK) 0.3 mg/0.3 mL IJ SOAJ injection Inject 0.3 mLs (0.3 mg total) into the muscle once. (Patient taking differently: Inject 0.3 mg into the muscle daily as needed (anaphylactic allergic reactions.). )  . gabapentin (NEURONTIN) 100 MG capsule Take 200 mg by mouth at bedtime.   . hydrOXYzine (VISTARIL) 25 MG capsule Take 25 mg by mouth 3 (three) times daily.  [name] lithium carbonate 300 MG capsule Take 600 mg by mouth at bedtime.  . medroxyPROGESTERone (PROVERA) 5 MG tablet TAKE ONE TABLET BY MOUTH DAILY  . mirtazapine (REMERON) 30 MG tablet Take 30 mg by mouth at bedtime.  . mometasone-formoterol (DULERA) 200-5 MCG/ACT AERO Inhale 2 puffs into the lungs 2 (two) times daily.  . nitroGLYCERIN (NITROSTAT) 0.4 MG SL tablet PLACE ONE TABLET UNDER THE TONGUE EVERY 5 MINUTES AS NEEDED FOR CHEST PAIN. MAXIMUM OF 3 DOSES.  [name] omeprazole (PRILOSEC) 40 MG capsule Take 40 mg by mouth 2 (two) times daily.   . ondansetron (ZOFRAN ODT) 4 MG disintegrating tablet Take 1 tablet (4 mg total) by mouth every 8 (eight) hours as needed for nausea or vomiting.  . propranolol ER (INDERAL LA) 80 MG 24 hr capsule Take 80 mg by mouth at bedtime.  [name] tiZANidine (ZANAFLEX) 4 MG tablet Take 4 mg by mouth at bedtime.   [name] venlafaxine XR (EFFEXOR-XR) 150 MG 24 hr capsule Take 150 mg by mouth daily.  [name]  zolpidem (AMBIEN) 10 MG tablet Take 10 mg by mouth at bedtime.     Allergies:   Cherry, Shellfish allergy, Sulfa antibiotics, Bee venom, and Imdur [isosorbide nitrate]   Social History   Tobacco Use  . Smoking status: Never Smoker  . Smokeless tobacco: Never Used  Vaping Use  . Vaping Use: Never used  Substance Use Topics  . Alcohol use: No    Alcohol/week: 0.0 standard drinks  . Drug use: No     Family Hx: The patient's family history includes AAA (abdominal aortic aneurysm) in her sister; COPD in her mother and sister; Heart failure in her mother; High Cholesterol in her mother and sister; Hypertension in her father and mother.  ROS:   Please see the history of present illness.   All other systems reviewed and are negative.   Prior CV studies:   The following studies were reviewed today:  Cath/PCI:  LHC (09/22/2018): No angiographically significant coronary artery disease.  Normal LVEF and LVEDP.  EP Procedures and Devices:  14-day event monitor (02/02/2019, [facility]): Predominantly sinus rhythm with rare PACs and PVCs.  No sustained arrhythmia or prolonged pause.  Patient triggered symptoms correspond to sinus rhythm.  Non-Invasive Evaluation(s):  TTE (10/29/2018): Normal LV size.  LVEF 55-60% with normal wall motion.  Grade 1 diastolic dysfunction.  Mild mitral regurgitation.  Normal RV size and function.  Normal PA pressure.  TTE (04/17/2015): Normal LV size and wall thickness. LVEF 60 to 65% with normal wall motion and diastolic function. Normal RV size and function. No significant valvular abnormalities.  Labs/Other Tests and Data Reviewed:    EKG:  No ECG reviewed.  Recent Labs: 12/03/2019: ALT 47; BUN 12; Creatinine, Ser 0.88; Hemoglobin 12.9; Platelets 256; Potassium 3.8; Sodium 140; TSH 2.429   Recent Lipid Panel No results found for: CHOL, TRIG, HDL, CHOLHDL, LDLCALC, LDLDIRECT  Wt Readings from Last 3 Encounters:  04/27/20 170 lb (77.1 kg)  12/31/19 185  lb 8 oz (84.1 kg)  12/03/19 192 lb 12 oz (87.4 kg)     Objective:    Vital Signs:  BP 126/76   Pulse 60   Ht 5\' 2"  (1.575 m)   Wt 170 lb (77.1 kg)   LMP 09/24/2016 (Approximate)   BMI 31.09 kg/m    VITAL SIGNS:  reviewed  ASSESSMENT & PLAN:    Palpitations: Palpitations have increased in frequency since we last spoke and are notably worse since Ms. Coggeshall wore an event monitor through [facility] last year.  We have agreed to repeat a 14-day event monitor.  In the meantime, we will continue her current medications, including propranolol ER 80 mg daily.  I encouraged Ms. Garofano to limit her caffeine intake.  Chest pain: Often associated with aforementioned palpitations.  We will plan to continue current doses of amlodipine and propranolol for treatment of possible microvascular dysfunction, given lack of CAD by catheterization in 2019.  Ms. Graffam was intolerant of isosorbide mononitrate in the past.  Hypertension: Blood pressure well-controlled.  No medication changes planned at this time.   Time:   Today, I have spent 12 minutes with the patient with telehealth technology discussing the above problems.     Medication Adjustments/Labs and Tests Ordered: Current medicines are reviewed at length with the patient today.  Concerns regarding medicines are outlined above.   Tests Ordered: Orders Placed This Encounter  Procedures  . LONG TERM MONITOR (3-14 DAYS)    Medication Changes: None.  Follow Up:  In Person in 6 week(s)  Signed, Nelva Bush, MD  04/27/2020 9:18 AM    [place]

## 2020-04-27 NOTE — Progress Notes (Signed)
Called patient and she verbalized understanding of the AVS from today's virtual visit. 6 week follow up appointment scheduled. Registered patient with [facility] to receive monitor by mail.  Patient aware of the process.

## 2020-04-27 NOTE — Patient Instructions (Signed)
Medication Instructions:  Your physician recommends that you continue on your current medications as directed. Please refer to the Current Medication list given to you today.  *If you need a refill on your cardiac medications before your next appointment, please call your pharmacy*   Lab Work: none If you have labs (blood work) drawn today and your tests are completely normal, you will receive your results only by: [name] MyChart Message (if you have MyChart) OR . A paper copy in the mail If you have any lab test that is abnormal or we need to change your treatment, we will call you to review the results.   Testing/Procedures:  ZIO MONITOR FOR 14 DAYS FOR PALPITATIONS: Your physician has recommended that you wear a Zio monitor. This monitor is a medical device that records the heart's electrical activity. Doctors most often use these monitors to diagnose arrhythmias. Arrhythmias are problems with the speed or rhythm of the heartbeat. The monitor is a small device applied to your chest. You can wear one while you do your normal daily activities. While wearing this monitor if you have any symptoms to push the button and record what you felt. Once you have worn this monitor for the period of time provider prescribed (Usually 14 days), you will return the monitor device in the postage paid box. Once it is returned they will download the data collected and provide [place] with a report which the provider will then review and we will call you with those results. Important tips:  1. Avoid showering during the first 24 hours of wearing the monitor. 2. Avoid excessive sweating to help maximize wear time. 3. Do not submerge the device, no hot tubs, and no swimming pools. 4. Keep any lotions or oils away from the patch. 5. After 24 hours you may shower with the patch on. Take brief showers with your back facing the shower head.  6. Do not remove patch once it has been placed because that will interrupt data and  decrease adhesive wear time. 7. Push the button when you have any symptoms and write down what you were feeling. 8. Once you have completed wearing your monitor, remove and place into box which has postage paid and place in your outgoing mailbox.  9. If for some reason you have misplaced your box then call our office and we can provide another box and/or mail it off for you.   Follow-Up: At [facility], you and your health needs are our priority.  As part of our continuing mission to provide you with exceptional heart care, we have created designated Provider Care Teams.  These Care Teams include your primary Cardiologist (physician) and Advanced Practice Providers (APPs -  Physician Assistants and Nurse Practitioners) who all work together to provide you with the care you need, when you need it.  We recommend signing up for the patient portal called "MyChart".  Sign up information is provided on this After Visit Summary.  MyChart is used to connect with patients for Virtual Visits (Telemedicine).  Patients are able to view lab/test results, encounter notes, upcoming appointments, etc.  Non-urgent messages can be sent to your provider as well.   To learn more about what you can do with MyChart, go to [email or web address].    Your next appointment:   6 week(s)  The format for your next appointment:   In Person  Provider:    You may see Nelva Bush, MD or one of the following Advanced Practice Providers on  your designated Care Team:    Murray Hodgkins, NP  Christell Faith, PA-C  Marrianne Mood, [place]

## 2020-05-01 ENCOUNTER — Ambulatory Visit (INDEPENDENT_AMBULATORY_CARE_PROVIDER_SITE_OTHER): Payer: PPO

## 2020-05-01 ENCOUNTER — Ambulatory Visit: Payer: PPO | Admitting: Internal Medicine

## 2020-05-01 DIAGNOSIS — R160 Hepatomegaly, not elsewhere classified: Secondary | ICD-10-CM | POA: Diagnosis not present

## 2020-05-01 DIAGNOSIS — R002 Palpitations: Secondary | ICD-10-CM

## 2020-05-01 DIAGNOSIS — D3A020 Benign carcinoid tumor of the appendix: Secondary | ICD-10-CM | POA: Diagnosis not present

## 2020-05-22 DIAGNOSIS — J449 Chronic obstructive pulmonary disease, unspecified: Secondary | ICD-10-CM | POA: Diagnosis not present

## 2020-05-22 DIAGNOSIS — F419 Anxiety disorder, unspecified: Secondary | ICD-10-CM | POA: Diagnosis not present

## 2020-05-27 DIAGNOSIS — R002 Palpitations: Secondary | ICD-10-CM | POA: Diagnosis not present

## 2020-06-01 ENCOUNTER — Other Ambulatory Visit: Payer: Self-pay | Admitting: Internal Medicine

## 2020-06-05 ENCOUNTER — Other Ambulatory Visit: Payer: Self-pay

## 2020-06-06 ENCOUNTER — Other Ambulatory Visit: Payer: Self-pay

## 2020-06-06 ENCOUNTER — Emergency Department
Admission: EM | Admit: 2020-06-06 | Discharge: 2020-06-06 | Discharge status: Against Medical Advice | Payer: PPO | Attending: Emergency Medicine | Admitting: Emergency Medicine

## 2020-06-06 ENCOUNTER — Telehealth: Payer: Self-pay | Admitting: Internal Medicine

## 2020-06-06 ENCOUNTER — Emergency Department: Payer: PPO

## 2020-06-06 ENCOUNTER — Encounter: Payer: Self-pay | Admitting: Emergency Medicine

## 2020-06-06 DIAGNOSIS — Z1389 Encounter for screening for other disorder: Secondary | ICD-10-CM | POA: Diagnosis not present

## 2020-06-06 DIAGNOSIS — Z79899 Other long term (current) drug therapy: Secondary | ICD-10-CM | POA: Insufficient documentation

## 2020-06-06 DIAGNOSIS — R112 Nausea with vomiting, unspecified: Secondary | ICD-10-CM | POA: Diagnosis not present

## 2020-06-06 DIAGNOSIS — E876 Hypokalemia: Secondary | ICD-10-CM | POA: Insufficient documentation

## 2020-06-06 DIAGNOSIS — R1110 Vomiting, unspecified: Secondary | ICD-10-CM | POA: Diagnosis not present

## 2020-06-06 DIAGNOSIS — R079 Chest pain, unspecified: Secondary | ICD-10-CM | POA: Diagnosis not present

## 2020-06-06 DIAGNOSIS — F331 Major depressive disorder, recurrent, moderate: Secondary | ICD-10-CM | POA: Diagnosis not present

## 2020-06-06 DIAGNOSIS — R109 Unspecified abdominal pain: Secondary | ICD-10-CM | POA: Diagnosis not present

## 2020-06-06 DIAGNOSIS — K7400 Hepatic fibrosis, unspecified: Secondary | ICD-10-CM | POA: Diagnosis not present

## 2020-06-06 DIAGNOSIS — R9431 Abnormal electrocardiogram [ECG] [EKG]: Secondary | ICD-10-CM

## 2020-06-06 DIAGNOSIS — R197 Diarrhea, unspecified: Secondary | ICD-10-CM

## 2020-06-06 DIAGNOSIS — Z955 Presence of coronary angioplasty implant and graft: Secondary | ICD-10-CM | POA: Insufficient documentation

## 2020-06-06 DIAGNOSIS — I110 Hypertensive heart disease with heart failure: Secondary | ICD-10-CM | POA: Diagnosis not present

## 2020-06-06 DIAGNOSIS — I228 Subsequent ST elevation (STEMI) myocardial infarction of other sites: Secondary | ICD-10-CM | POA: Insufficient documentation

## 2020-06-06 DIAGNOSIS — I5030 Unspecified diastolic (congestive) heart failure: Secondary | ICD-10-CM | POA: Diagnosis not present

## 2020-06-06 LAB — BASIC METABOLIC PANEL
Anion gap: 12 (ref 5–15)
BUN: 16 mg/dL (ref 6–20)
CO2: 26 mmol/L (ref 22–32)
Calcium: 9.6 mg/dL (ref 8.9–10.3)
Chloride: 99 mmol/L (ref 98–111)
Creatinine, Ser: 0.87 mg/dL (ref 0.44–1.00)
GFR calc Af Amer: >60 mL/min (ref >60)
GFR calc non Af Amer: >60 mL/min (ref >60)
Glucose, Bld: 114 mg/dL — ABNORMAL HIGH (ref 70–99)
Potassium: 3.3 mmol/L — ABNORMAL LOW (ref 3.5–5.1)
Sodium: 137 mmol/L (ref 135–145)

## 2020-06-06 LAB — CBC
HCT: 44.3 % (ref 36.0–46.0)
Hemoglobin: 14.9 g/dL (ref 12.0–15.0)
MCH: 31.6 pg (ref 26.0–34.0)
MCHC: 33.6 g/dL (ref 30.0–36.0)
MCV: 93.9 fL (ref 80.0–100.0)
Platelets: 329 10*3/uL (ref 150–400)
RBC: 4.72 MIL/uL (ref 3.87–5.11)
RDW: 12.3 % (ref 11.5–15.5)
WBC: 9.5 10*3/uL (ref 4.0–10.5)
nRBC: 0 % (ref 0.0–0.2)

## 2020-06-06 LAB — TROPONIN I (HIGH SENSITIVITY): Troponin I (High Sensitivity): <2 ng/L (ref <18)

## 2020-06-06 MED ORDER — PROCHLORPERAZINE EDISYLATE 10 MG/2ML IJ SOLN
10.0000 mg | Freq: Once | INTRAMUSCULAR | Status: AC
  Administered 2020-06-06: 10 mg via INTRAVENOUS
  Filled 2020-06-06: qty 2

## 2020-06-06 MED ORDER — ACETAMINOPHEN 500 MG PO TABS
1000.0000 mg | ORAL_TABLET | Freq: Once | ORAL | Status: AC
  Administered 2020-06-06: 1000 mg via ORAL
  Filled 2020-06-06: qty 2

## 2020-06-06 MED ORDER — POTASSIUM CHLORIDE CRYS ER 20 MEQ PO TBCR
40.0000 meq | EXTENDED_RELEASE_TABLET | Freq: Once | ORAL | Status: AC
  Administered 2020-06-06: 40 meq via ORAL
  Filled 2020-06-06: qty 2

## 2020-06-06 MED ORDER — LACTATED RINGERS IV BOLUS
1000.0000 mL | Freq: Once | INTRAVENOUS | Status: AC
  Administered 2020-06-06: 1000 mL via INTRAVENOUS

## 2020-06-06 NOTE — ED Notes (Signed)
Pt requesting IV be taken out and to sign so she can leave.  Patient aware of repeat testing but does not want to wait at this time.  MD notified.

## 2020-06-06 NOTE — ED Provider Notes (Signed)
[facility] Emergency Department Provider Note ____________________________________________   First MD Initiated Contact with Patient 06/06/20 1732     (approximate)  I have reviewed the triage vital signs and the nursing notes.  HISTORY  Chief Complaint Abnormal ECG   HPI Melinda Shaw is a 54 y.o. female with concerns for an abnormal EKG at her PCPs office earlier today.   History of HTN, anxiety.  Fibromyalgia 09/2018 left heart cath with no evidence of CAD.  Patient and husband report 5 days of nausea, vomiting and watery diarrhea.  Patient denies any abdominal pain, just persistent nausea and nonbloody nonbilious emesis about 3-5 episodes per day.  Couple episodes of watery diarrhea per day, without melena or hematochezia.  Patient denies any frontal abdominal pain.  She reports some right-sided chest wall pain that started the past 1-2 days due to frequent vomiting.  Patient denies any fevers, substernal chest pain/pressure, shortness of breath, cough, syncope.   She was concerned for being dehydrated.  She reports bitemporal aching headache for the past 1 day.  Due to persistent gastrointestinal symptoms, patient saw her PCP earlier today, as a part of that work-up she had an EKG noting ST depressions in the lateral and inferior leads, and so she was directed to the ED for further evaluation.  Past Medical History:  Diagnosis Date  . Anemia    H/O  . Anxiety   . Bronchitis   . Chest pain    a. 09/2018 Cath: Nl cors. EF 65%->Med rx (long-acting nitrate added).  . Depression   . Diastolic dysfunction    a. 10/2018 Echo: EF 55-60%, no rwma, Gr1DD. Mild MR. Nl RV fxn.  . DOE (dyspnea on exertion)   . Fibromyalgia   . GERD (gastroesophageal reflux disease)   . Headache   . Hyperlipidemia   . Hypertension    no meds  . Palpitations    a. 01/2019 [facility]): NSR w/ rare PACs & PVCs.  [name] PONV (postoperative nausea and vomiting)     Patient  Active Problem List   Diagnosis Date Noted  . Palpitations 12/04/2019  . Essential hypertension 12/04/2019  . Chest pain of uncertain etiology 40/98/1191  . Unstable angina ([place]) 09/16/2018  . Adult bronchiectasis ([place]) 08/16/2015  . SOB (shortness of breath) 02/22/2015  . Cough 02/22/2015    Past Surgical History:  Procedure Laterality Date  . ANTERIOR CRUCIATE LIGAMENT REPAIR    . CARDIAC CATHETERIZATION    . CHOLECYSTECTOMY    . CORONARY ANGIOPLASTY    . ESOPHAGOGASTRODUODENOSCOPY (EGD) WITH PROPOFOL N/A 08/28/2015   Procedure: ESOPHAGOGASTRODUODENOSCOPY (EGD) WITH PROPOFOL;  Surgeon: Josefine Class, MD;  Location: [facility] ENDOSCOPY;  Service: Endoscopy;  Laterality: N/A;  . KNEE SURGERY     left knee   . LEFT HEART CATH AND CORONARY ANGIOGRAPHY Left 09/22/2018   Procedure: LEFT HEART CATH AND CORONARY ANGIOGRAPHY;  Surgeon: Nelva Bush, MD;  Location: [place] CV LAB;  Service: Cardiovascular;  Laterality: Left;  [name] MASS EXCISION Left 01/26/2016   Procedure: EXCISION OF LEFT WRIST VOLAR MASS;  Surgeon: Charlotte Crumb, MD;  Location: [place];  Service: Orthopedics;  Laterality: Left;  . NOSE SURGERY    . POLYPECTOMY    . TUBAL LIGATION    . VIDEO BRONCHOSCOPY N/A 05/29/2015   Procedure: VIDEO BRONCHOSCOPY WITHOUT FLUORO;  Surgeon: Vilinda Boehringer, MD;  Location: [facility] ORS;  Service: Cardiopulmonary;  Laterality: N/A;    Prior to Admission medications  Medication Sig Start Date End Date Taking? Authorizing Provider  albuterol (PROVENTIL HFA;VENTOLIN HFA) 108 (90 BASE) MCG/ACT inhaler Inhale 2 puffs into the lungs every 6 (six) hours as needed for wheezing or shortness of breath.    [provider]  amLODipine (NORVASC) 5 MG tablet TAKE ONE TABLET BY MOUTH DAILY 06/01/20   End, Harrell Gave, MD  clonazePAM (KLONOPIN) 0.5 MG tablet Take 0.5 mg by mouth 2 (two) times daily as needed. 11/13/19   [provider]  cyclobenzaprine (FLEXERIL) 10  MG tablet Take 1 tablet (10 mg total) by mouth 3 (three) times daily as needed for muscle spasms. Patient taking differently: Take 10 mg by mouth at bedtime.  10/10/16   Hagler, Jami L, PA-C  eletriptan (RELPAX) 20 MG tablet Take 20 mg by mouth every 2 (two) hours as needed for migraine.     [provider]  EPINEPHrine (EPIPEN 2-PAK) 0.3 mg/0.3 mL IJ SOAJ injection Inject 0.3 mLs (0.3 mg total) into the muscle once. Patient taking differently: Inject 0.3 mg into the muscle daily as needed (anaphylactic allergic reactions.).  05/31/16   Nance Pear, MD  gabapentin (NEURONTIN) 100 MG capsule Take 200 mg by mouth at bedtime.     [provider]  hydrOXYzine (VISTARIL) 25 MG capsule Take 25 mg by mouth 3 (three) times daily.    [provider]  lithium carbonate 300 MG capsule Take 600 mg by mouth at bedtime.    [provider]  medroxyPROGESTERone (PROVERA) 5 MG tablet TAKE ONE TABLET BY MOUTH DAILY 05/10/19   Philip Aspen, CNM  mirtazapine (REMERON) 30 MG tablet Take 30 mg by mouth at bedtime.    [provider]  mometasone-formoterol (DULERA) 200-5 MCG/ACT AERO Inhale 2 puffs into the lungs 2 (two) times daily. 03/28/16   Mungal, Roxanne Mins, MD  nitroGLYCERIN (NITROSTAT) 0.4 MG SL tablet PLACE ONE TABLET UNDER THE TONGUE EVERY 5 MINUTES AS NEEDED FOR CHEST PAIN. MAXIMUM OF 3 DOSES. 10/11/19   End, Harrell Gave, MD  omeprazole (PRILOSEC) 40 MG capsule Take 40 mg by mouth 2 (two) times daily.     [provider]  ondansetron (ZOFRAN ODT) 4 MG disintegrating tablet Take 1 tablet (4 mg total) by mouth every 8 (eight) hours as needed for nausea or vomiting. 03/05/17   Johnn Hai, PA-C  propranolol ER (INDERAL LA) 80 MG 24 hr capsule Take 80 mg by mouth at bedtime.    [provider]  tiZANidine (ZANAFLEX) 4 MG tablet Take 4 mg by mouth at bedtime.     [provider]  venlafaxine XR (EFFEXOR-XR) 150 MG 24 hr capsule Take 150 mg  by mouth daily.    [provider]  zolpidem (AMBIEN) 10 MG tablet Take 10 mg by mouth at bedtime.    [provider]    Allergies Cherry, Shellfish allergy, Sulfa antibiotics, Bee venom, and Imdur [isosorbide nitrate]  Family History  Problem Relation Age of Onset  . COPD Mother   . High Cholesterol Mother   . Heart failure Mother   . Hypertension Mother   . Hypertension Father   . AAA (abdominal aortic aneurysm) Sister   . COPD Sister   . High Cholesterol Sister     Social History Social History   Tobacco Use  . Smoking status: Never Smoker  . Smokeless tobacco: Never Used  Vaping Use  . Vaping Use: Never used  Substance Use Topics  . Alcohol use: No    Alcohol/week: 0.0 standard  drinks  . Drug use: No    Review of Systems  Constitutional: No fever/chills Eyes: No visual changes. ENT: No sore throat. Cardiovascular: Denies chest pain. Respiratory: Denies shortness of breath. Gastrointestinal: No abdominal pain.    No constipation. Positive for nausea, vomiting or diarrhea. Genitourinary: Negative for dysuria. Musculoskeletal: Negative for back pain. Skin: Negative for rash. Neurological: Negative for headaches, focal weakness or numbness.   ____________________________________________   PHYSICAL EXAM:  VITAL SIGNS: ED Triage Vitals  Enc Vitals Group     BP 06/06/20 1739 (!) 171/94     Pulse Rate 06/06/20 1736 85     Resp 06/06/20 1736 18     Temp 06/06/20 1736 (!) 97.5 F (36.4 C)     Temp Source 06/06/20 1736 Oral     SpO2 06/06/20 1736 99 %     Weight 06/06/20 1738 169 lb 15.6 oz (77.1 kg)     Height 06/06/20 1738 5\' 2"  (1.575 m)     Head Circumference --      Peak Flow --      Pain Score 06/06/20 1738 4     Pain Loc --      Pain Edu? --      Excl. in [place]? --      Constitutional: Alert and oriented. In no acute distress.  Uncomfortable-appearing, but conversational in full sentences. Eyes: Conjunctivae are normal. PERRL.  EOMI. Head: Atraumatic. Nose: No congestion/rhinnorhea. Mouth/Throat: Mucous membranes are dry.  Oropharynx non-erythematous. Neck: No stridor. No cervical spine tenderness to palpation. Cardiovascular: Normal rate, regular rhythm. Grossly normal heart sounds.  Good peripheral circulation. Respiratory: Normal respiratory effort.  No retractions. Lungs CTAB. Gastrointestinal: Soft , nondistended, nontender to palpation. No abdominal bruits. No CVA tenderness. Musculoskeletal: No lower extremity tenderness nor edema.  No joint effusions. No signs of acute trauma. Neurologic:  Normal speech and language. No gross focal neurologic deficits are appreciated. No gait instability noted. Skin:  Skin is warm, dry and intact. No rash noted. Psychiatric: Mood and affect are normal. Speech and behavior are normal.  ____________________________________________   LABS (all labs ordered are listed, but only abnormal results are displayed)  Labs Reviewed  BASIC METABOLIC PANEL - Abnormal; Notable for the following components:      Result Value   Potassium 3.3 (*)    Glucose, Bld 114 (*)    All other components within normal limits  CBC  POC URINE PREG, ED  TROPONIN I (HIGH SENSITIVITY)   ____________________________________________  12 Lead EKG   ____________________________________________  RADIOLOGY  ED MD interpretation: 2 view CXR without evidence of acute cardiopulmonary pathology  Official radiology report(s): DG Chest 2 View  Result Date: 06/06/2020 CLINICAL DATA:  Chest pain EXAM: CHEST - 2 VIEW COMPARISON:  09/29/2018 FINDINGS: The heart size and mediastinal contours are within normal limits. Both lungs are clear. The visualized skeletal structures are unremarkable. IMPRESSION: No active cardiopulmonary disease. Electronically Signed   By: Donavan Foil M.D.   On: 06/06/2020 19:08    ____________________________________________   PROCEDURES  Procedure(s) performed (including  Critical Care):  Procedures   ____________________________________________   INITIAL IMPRESSION / ASSESSMENT AND PLAN / ED COURSE  54 year old woman presents to the ED from clinic with EKG changes, most attributable to hypokalemia, and ultimately leaving AMA prior to work-up completion.  Normal vital signs on room air.  Exam initially with an uncomfortable-appearing patient, the resolved after potassium repletion and 1 L of LR, and otherwise without evidence of acute pathology.  Blood work with mild hypokalemia as a significant derangement.  EKG does demonstrate new ST depressions in inferior and lateral leads compared to previous.  Reassuring low high-sensitivity troponin.  Advised patient of my recommendations for repeat EKG and troponin after potassium repletion, though she refuses this and demands to leave AMA.  She has capacity and I see no reason to IVC the patient for continued work-up.  Advised the patient follow-up with her PCP with the next 5 days to have a potassium and EKG recheck.  She ambulates out of the ED in no acute distress.  Clinical Course as of Jun 06 2229  Tue Jun 06, 2020  1952 Reassessed.  Patient reports feeling better after Tylenol, Compazine and 1 L LR.   [DS]  2039 RN informs that patient would like to leave AMA.  I immediately go to the bedside to reassess the patient.  She reports that she continues to feel well, but "just has to get out of here."  I educated the patient of our work-up so far, including an incomplete evaluation of her heart.  I recommended that she stay for another 1 hour to facilitate repeat EKG and troponin testing.  She expresses understanding, but continued desire to leave AMA.  She expresses understanding of risks, including death, of leaving early without complete evaluation of her heart.   [DS]    Clinical Course User Index [DS] Vladimir Crofts, MD     ____________________________________________   FINAL CLINICAL IMPRESSION(S) / ED  DIAGNOSES  Final diagnoses:  Hypokalemia  ST segment depression  Nausea vomiting and diarrhea     ED Discharge Orders    None       Praise Dolecki Tamala Julian   Note:  This document was prepared using Dragon voice recognition software and may include unintentional dictation errors.   Vladimir Crofts, MD 06/06/20 2232

## 2020-06-06 NOTE — ED Triage Notes (Signed)
Pt presents via POV with c/o abnormal ekg at PCP. Pt endorses tightness in chest at this time. Pt states she has been fatigued for few days which is why she went to PCP. Pt currently alert and oriented x4.

## 2020-06-06 NOTE — Telephone Encounter (Signed)
Spoke with patient. Was seen this afternoon as [facility]. She was there since 2 pm and they were running behind. Patient reports vomiting from Friday to Sunday with some residual nausea today. Though she says she feels better today. Went to her PCP for evaluation to make sure nothing else was going on. PCP did EKG and found it was abnormal and irregular compared to the last one they had on file 2017. PCP recommended she go to a walk in clinic for lab work and another EKG to ensure stability and further evaluation. Patient denies chest pain, shortness of breath, left arm pain or jaw pain. Slight soreness on right chest which she is attributing to the vomiting.   Advised patient is difficult to advise as we do not have the EKG on hand and her PCP already assessed and advised her to go for further evaluation. I recommended patient to to the Emergency room for evaluation and to rule out any cardiac issues or other issues. She was reluctant but understood the advice. She is scheduled for next week with Gilford Rile, NP. She understands that she should be evaluated before then. We do not have any openings in the next 24 hours.

## 2020-06-06 NOTE — Telephone Encounter (Signed)
Patient calling  Would like to know what to do - patient was just seen at PCP and had an abnormal EKG They told her to have another EKG done within the next 2 hours but knows there will be a lot longer wait time at ED Please call to discuss

## 2020-06-06 NOTE — ED Triage Notes (Signed)
Pt arrived for CP from pcp.  Already did EKG, will bring to room r/t EKG results.

## 2020-06-14 DIAGNOSIS — R7309 Other abnormal glucose: Secondary | ICD-10-CM | POA: Diagnosis not present

## 2020-06-14 DIAGNOSIS — I639 Cerebral infarction, unspecified: Secondary | ICD-10-CM | POA: Diagnosis not present

## 2020-06-15 ENCOUNTER — Ambulatory Visit (INDEPENDENT_AMBULATORY_CARE_PROVIDER_SITE_OTHER): Payer: PPO | Admitting: Family

## 2020-06-15 ENCOUNTER — Other Ambulatory Visit: Payer: Self-pay

## 2020-06-15 ENCOUNTER — Encounter: Payer: Self-pay | Admitting: Family

## 2020-06-15 VITALS — BP 120/82 | HR 74 | Ht 62.0 in | Wt 182.2 lb

## 2020-06-15 DIAGNOSIS — R110 Nausea: Secondary | ICD-10-CM | POA: Diagnosis not present

## 2020-06-15 DIAGNOSIS — I491 Atrial premature depolarization: Secondary | ICD-10-CM | POA: Diagnosis not present

## 2020-06-15 DIAGNOSIS — I10 Essential (primary) hypertension: Secondary | ICD-10-CM

## 2020-06-15 DIAGNOSIS — I493 Ventricular premature depolarization: Secondary | ICD-10-CM | POA: Diagnosis not present

## 2020-06-15 DIAGNOSIS — R002 Palpitations: Secondary | ICD-10-CM | POA: Diagnosis not present

## 2020-06-15 MED ORDER — ONDANSETRON HCL 4 MG PO TABS: 4.0000 mg | ORAL_TABLET | Freq: Three times a day (TID) | ORAL | 0 refills | Status: DC | PRN

## 2020-06-15 MED ORDER — DILTIAZEM HCL 30 MG PO TABS
30.0000 mg | ORAL_TABLET | Freq: Every day | ORAL | 1 refills | Status: DC | PRN
Start: 2020-06-15 — End: 2020-12-29

## 2020-06-15 NOTE — Patient Instructions (Signed)
Medication Instructions:  Your physician has recommended you make the following change in your medication:   START Diltiazem 30mg  daily as needed for palpitations  CONTINUE Propranolol 80mg  daily   A refill of your Zofran was sent in, further refills will need to come from your primary care provider or gastroenterologist.   *If you need a refill on your cardiac medications before your next appointment, please call your pharmacy*  Lab Work: Your physician recommends that you return for lab work today: BMP  If you have labs (blood work) drawn today and your tests are completely normal, you will receive your results only by: [name] MyChart Message (if you have MyChart) OR . A paper copy in the mail If you have any lab test that is abnormal or we need to change your treatment, we will call you to review the results.   Testing/Procedures: Your ZIO monitor showed predominantly normal sinus rhythm which was a great result!  Follow-Up: At [facility], you and your health needs are our priority.  As part of our continuing mission to provide you with exceptional heart care, we have created designated Provider Care Teams.  These Care Teams include your primary Cardiologist (physician) and Advanced Practice Providers (APPs -  Physician Assistants and Nurse Practitioners) who all work together to provide you with the care you need, when you need it.  We recommend signing up for the patient portal called "MyChart".  Sign up information is provided on this After Visit Summary.  MyChart is used to connect with patients for Virtual Visits (Telemedicine).  Patients are able to view lab/test results, encounter notes, upcoming appointments, etc.  Non-urgent messages can be sent to your provider as well.   To learn more about what you can do with MyChart, go to [email or web address].    Your next appointment:   3 month(s)  The format for your next appointment:   In Person  Provider:    You may see  Nelva Bush, MD or one of the following Advanced Practice Providers on your designated Care Team:    Murray Hodgkins, NP  Christell Faith, PA-C  Marrianne Mood, PA-C  Laurann Montana, NP  Other Instructions   Palpitations Palpitations are feelings that your heartbeat is not normal. Your heartbeat may feel like it is:  Uneven.  Faster than normal.  Fluttering.  Skipping a beat. This is usually not a serious problem. In some cases, you may need tests to rule out any serious problems. Follow these instructions at home: Pay attention to any changes in your condition. Take these actions to help manage your symptoms: Eating and drinking  Avoid: ? Coffee, tea, soft drinks, and energy drinks. ? Chocolate. ? Alcohol. ? Diet pills. Lifestyle   Try to lower your stress. These things can help you relax: ? Yoga. ? Deep breathing and meditation. ? Exercise. ? Using words and images to create positive thoughts (guided imagery). ? Using your mind to control things in your body (biofeedback).  Do not use drugs.  Get plenty of rest and sleep. Keep a regular bed time. General instructions   Take over-the-counter and prescription medicines only as told by your doctor.  Do not use any products that contain nicotine or tobacco, such as cigarettes and e-cigarettes. If you need help quitting, ask your doctor.  Keep all follow-up visits as told by your doctor. This is important. You may need more tests if palpitations do not go away or get worse. Summary  Palpitations are feelings that  your heartbeat is uneven or faster than normal. It may feel like your heart is fluttering or skipping a beat.  Avoid food and drinks that may cause palpitations. These include caffeine, chocolate, and alcohol.  Try to lower your stress. Do not smoke or use drugs.  Get help right away if you faint or have chest pain, shortness of breath, a severe headache, or dizziness. This information is not  intended to replace advice given to you by your health care provider. Make sure you discuss any questions you have with your health care provider. Document Revised: 12/10/2017 Document Reviewed: 12/10/2017 Elsevier Patient Education  2020 [employer].

## 2020-06-15 NOTE — Progress Notes (Signed)
Office Visit    Patient Name: Melinda Shaw Date of Encounter: 06/15/2020  Primary Care Provider:  Patient, No Pcp Per Primary Cardiologist:  Nelva Bush, MD Electrophysiologist:  None   Chief Complaint    Melinda Shaw is a 54 y.o. female with a hx of chest pain, palpitations presents today for follow up after ZIO monitor.    Past Medical History    Past Medical History:  Diagnosis Date  . Anemia    H/O  . Anxiety   . Bronchitis   . Chest pain    a. 09/2018 Cath: Nl cors. EF 65%->Med rx (long-acting nitrate added).  . Depression   . Diastolic dysfunction    a. 10/2018 Echo: EF 55-60%, no rwma, Gr1DD. Mild MR. Nl RV fxn.  . DOE (dyspnea on exertion)   . Fibromyalgia   . GERD (gastroesophageal reflux disease)   . Headache   . Hyperlipidemia   . Hypertension    no meds  . Palpitations    a. 01/2019 Zio [facility]): NSR w/ rare PACs & PVCs.  [name] PONV (postoperative nausea and vomiting)    Past Surgical History:  Procedure Laterality Date  . ANTERIOR CRUCIATE LIGAMENT REPAIR    . CARDIAC CATHETERIZATION    . CHOLECYSTECTOMY    . CORONARY ANGIOPLASTY    . ESOPHAGOGASTRODUODENOSCOPY (EGD) WITH PROPOFOL N/A 08/28/2015   Procedure: ESOPHAGOGASTRODUODENOSCOPY (EGD) WITH PROPOFOL;  Surgeon: Josefine Class, MD;  Location: [facility] ENDOSCOPY;  Service: Endoscopy;  Laterality: N/A;  . KNEE SURGERY     left knee   . LEFT HEART CATH AND CORONARY ANGIOGRAPHY Left 09/22/2018   Procedure: LEFT HEART CATH AND CORONARY ANGIOGRAPHY;  Surgeon: Nelva Bush, MD;  Location: [place] CV LAB;  Service: Cardiovascular;  Laterality: Left;  [name] MASS EXCISION Left 01/26/2016   Procedure: EXCISION OF LEFT WRIST VOLAR MASS;  Surgeon: Charlotte Crumb, MD;  Location: [place];  Service: Orthopedics;  Laterality: Left;  . NOSE SURGERY    . POLYPECTOMY    . TUBAL LIGATION    . VIDEO BRONCHOSCOPY N/A 05/29/2015   Procedure: VIDEO BRONCHOSCOPY WITHOUT FLUORO;  Surgeon:  Vilinda Boehringer, MD;  Location: [facility] ORS;  Service: Cardiopulmonary;  Laterality: N/A;    Allergies  Allergies  Allergen Reactions  . Cherry Swelling    Swelling of lips and throat  . Shellfish Allergy Shortness Of Breath  . Sulfa Antibiotics Shortness Of Breath    Stopped breathing, Swelling of throat  . Bee Venom     Swelling of throat  . Imdur [Isosorbide Nitrate]     Worsening headaches     History of Present Illness    Melinda Shaw is a 54 y.o. female with a hx of HTN, HLD, bronchiectasis, fibromyalgia, migraine headaches, anxiety/depression, palpitations last seen 04/27/2020 by Dr. Saunders Revel via virtual medicine.  Previous cardiac catheterization in 09/2018 with no coronary artery disease.  Echo 10/2018 with EF 55 to 31%, grade 1 diastolic dysfunction, mild MR.  She has been maintained on amlodipine and propanolol for treatment of possible microvascular dysfunction.  She has been intolerant of isosorbide mononitrate in the past.  At last virtual visit she noted increased palpitations associated with dizziness and was mailed a ZIO monitor.  Her mild swelling in ankles and wrist was noted be stable.  Long-term monitor 06/05/2020 with primarily sinus rhythm with average heart rate of 75 bpm (range 48-141 bpm), rare PAC/PVC.  No significant arrhythmia noted.  ED visit 06/06/2020 with  5-day history of nausea, vomiting, moderate diarrhea.  She had EKG work-up at her PCP prior to being seen in the ED which per report showed ST depressions in lateral and inferior leads.  She was provided potassium repletion, 1 L of LR.  Blood work showed mild hypokalemia with K3.3, creatinine 0.87, high sensitive troponin less than 2.  Chest x-ray with no acute findings.  She declined repeat EKG and troponin after potassium repletion.  Presents today feeling overall well. Her GI illness has resolved. She reports continued intermittent palpitations that are occasionally associated with pain. These occur  primarily at rest. No dyspnea exertion nor shortness of breath. We reviewed the results of her monitor in depth.  EKGs/Labs/Other Studies Reviewed:   The following studies were reviewed today:  Monitor 06/05/20  The patient was monitored for 12 days, 23 hours.  The predominant rhythm was sinus with an average rate of 75 bpm (range 48-141 bpm).  There were rare PAC's and PVC's.  No significant arrhythmia or prolonged pause was observed.  There were no patient triggered events.   Predominantly sinus rhythm with rare PAC's and PVC's.  No significant arrhythmia noted.  EKG:  EKG is ordered today.  The ekg ordered today demonstrates NSR 74 bpm with no acute St/T wave changes - when compared to prior EKG her ST depression in inferolateral and anterolateral leads has resolved.   Recent Labs: 12/03/2019: ALT 47; TSH 2.429 06/06/2020: BUN 16; Creatinine, Ser 0.87; Hemoglobin 14.9; Platelets 329; Potassium 3.3; Sodium 137  Recent Lipid Panel No results found for: CHOL, TRIG, HDL, CHOLHDL, VLDL, LDLCALC, LDLDIRECT  Home Medications   Current Meds  Medication Sig  . albuterol (PROVENTIL HFA;VENTOLIN HFA) 108 (90 BASE) MCG/ACT inhaler Inhale 2 puffs into the lungs every 6 (six) hours as needed for wheezing or shortness of breath.  [name] amLODipine (NORVASC) 5 MG tablet TAKE ONE TABLET BY MOUTH DAILY  . clonazePAM (KLONOPIN) 0.5 MG tablet Take 0.5 mg by mouth 2 (two) times daily as needed.  . cyclobenzaprine (FLEXERIL) 10 MG tablet Take 1 tablet (10 mg total) by mouth 3 (three) times daily as needed for muscle spasms. (Patient taking differently: Take 10 mg by mouth at bedtime. )  . EPINEPHrine (EPIPEN 2-PAK) 0.3 mg/0.3 mL IJ SOAJ injection Inject 0.3 mLs (0.3 mg total) into the muscle once. (Patient taking differently: Inject 0.3 mg into the muscle daily as needed (anaphylactic allergic reactions.). )  . gabapentin (NEURONTIN) 400 MG capsule Take 400 mg by mouth 3 (three) times daily.  .  hydrOXYzine (VISTARIL) 25 MG capsule Take 25 mg by mouth 3 (three) times daily.  [name] lithium carbonate 300 MG capsule Take 600 mg by mouth at bedtime.  . mirtazapine (REMERON) 30 MG tablet Take 30 mg by mouth at bedtime.  . mometasone-formoterol (DULERA) 200-5 MCG/ACT AERO Inhale 2 puffs into the lungs 2 (two) times daily.  . nitroGLYCERIN (NITROSTAT) 0.4 MG SL tablet PLACE ONE TABLET UNDER THE TONGUE EVERY 5 MINUTES AS NEEDED FOR CHEST PAIN. MAXIMUM OF 3 DOSES.  [name] omeprazole (PRILOSEC) 40 MG capsule Take 40 mg by mouth 2 (two) times daily.   . ondansetron (ZOFRAN) 4 MG tablet Take one tab every four hours as needed for nausea day prior to procedure  . propranolol ER (INDERAL LA) 80 MG 24 hr capsule Take 80 mg by mouth at bedtime.  [name] tiZANidine (ZANAFLEX) 4 MG tablet Take 4 mg by mouth at bedtime.   [name] venlafaxine XR (EFFEXOR-XR) 150 MG 24  hr capsule Take 150 mg by mouth daily.  [name] zolpidem (AMBIEN) 10 MG tablet Take 10 mg by mouth at bedtime.      Review of Systems    Review of Systems  Constitutional: Negative for chills, fever and malaise/fatigue.  Cardiovascular: Positive for palpitations. Negative for chest pain, dyspnea on exertion, leg swelling, near-syncope, orthopnea and syncope.  Respiratory: Negative for cough, shortness of breath and wheezing.   Gastrointestinal: Negative for nausea and vomiting.  Neurological: Negative for dizziness, light-headedness and weakness.   All other systems reviewed and are otherwise negative except as noted above.  Physical Exam    VS:  BP 120/82 (BP Location: Left Arm, Patient Position: Sitting, Cuff Size: Normal)   Pulse 74   Ht 5\' 2"  (1.575 m)   Wt 182 lb 3.2 oz (82.6 kg)   LMP 09/24/2016 (Approximate)   SpO2 96%   BMI 33.32 kg/m  , BMI Body mass index is 33.32 kg/m. GEN: Well nourished, well developed, in no acute distress. HEENT: normal. Neck: Supple, no JVD, carotid bruits, or masses. Cardiac: RRR, no murmurs, rubs, or gallops. No  clubbing, cyanosis, edema.  Radials/DP/PT 2+ and equal bilaterally.  Respiratory:  Respirations regular and unlabored, clear to auscultation bilaterally. GI: Soft, nontender, nondistended, BS + x 4. MS: No deformity or atrophy. Skin: Warm and dry, no rash. Neuro:  Strength and sensation are intact. Anxious. Psych: Normal affect.  Assessment & Plan    1. Palpitations -ZIO monitor with infrequent PVC/PAC. She is very bothered by her palpitations. We discussed triggers including caffeine, stress, dehydration. Continue propranolol 80 mg daily. Start diltiazem 30 mg daily as needed for palpitations.  2. Chest pain in adult/abnormal EKG/hypokalemia-prior cardiac catheterization November 2019 with no coronary artery disease. Reports when she has palpitations she will intermittently feel a chest tightness. This chest tightness is not associate with exertion and always occurs with palpitations. She had recent ED visit with ST depressions in anterior lateral and inferior lateral leads in the setting of hypokalemia and acute GI illness as well as dehydration. Her EKG today shows normal sinus rhythm and those previous ST depressions have resolved. Her symptoms are very atypical for angina and there is no indication for ischemic evaluation at this time. We'll repeat BMP today to ensure her potassium is returned to normal.  3. Nausea -encouraged to continue to follow with PCP. Provided short refill prescription for Zofran but further refills will need to come from her primary care provider. She verbalized understanding.  4. HTN - BP well controlled. Continue current antihypertensive regimen.   5. Migraine - encouraged to follow with PCP/neurology. Will defer transition of Metoprolol to alternate BB as she is presently having migraine.   Disposition: Follow up in 3 month(s) with Dr. Saunders Revel or APP   Loel Dubonnet, NP 06/15/2020, 11:11 AM

## 2020-06-16 LAB — BASIC METABOLIC PANEL
BUN/Creatinine Ratio: 9 (ref 9–23)
BUN: 8 mg/dL (ref 6–24)
CO2: 27 mmol/L (ref 20–29)
Calcium: 10 mg/dL (ref 8.7–10.2)
Chloride: 102 mmol/L (ref 96–106)
Creatinine, Ser: 0.85 mg/dL (ref 0.57–1.00)
GFR calc Af Amer: 90 mL/min/{1.73_m2} (ref >59)
GFR calc non Af Amer: 78 mL/min/{1.73_m2} (ref >59)
Glucose: 109 mg/dL — ABNORMAL HIGH (ref 65–99)
Potassium: 4.5 mmol/L (ref 3.5–5.2)
Sodium: 140 mmol/L (ref 134–144)

## 2020-07-19 DIAGNOSIS — F331 Major depressive disorder, recurrent, moderate: Secondary | ICD-10-CM | POA: Diagnosis not present

## 2020-07-19 DIAGNOSIS — Z23 Encounter for immunization: Secondary | ICD-10-CM | POA: Diagnosis not present

## 2020-07-19 DIAGNOSIS — G43709 Chronic migraine without aura, not intractable, without status migrainosus: Secondary | ICD-10-CM | POA: Diagnosis not present

## 2020-08-01 DIAGNOSIS — R030 Elevated blood-pressure reading, without diagnosis of hypertension: Secondary | ICD-10-CM | POA: Diagnosis not present

## 2020-08-01 DIAGNOSIS — I639 Cerebral infarction, unspecified: Secondary | ICD-10-CM | POA: Diagnosis not present

## 2020-08-04 ENCOUNTER — Other Ambulatory Visit: Payer: Self-pay | Admitting: Acute Care

## 2020-08-04 DIAGNOSIS — R519 Headache, unspecified: Secondary | ICD-10-CM | POA: Diagnosis not present

## 2020-08-04 DIAGNOSIS — R569 Unspecified convulsions: Secondary | ICD-10-CM | POA: Diagnosis not present

## 2020-08-04 DIAGNOSIS — G379 Demyelinating disease of central nervous system, unspecified: Secondary | ICD-10-CM

## 2020-08-04 DIAGNOSIS — R55 Syncope and collapse: Secondary | ICD-10-CM | POA: Diagnosis not present

## 2020-08-04 DIAGNOSIS — R42 Dizziness and giddiness: Secondary | ICD-10-CM | POA: Diagnosis not present

## 2020-08-04 DIAGNOSIS — R410 Disorientation, unspecified: Secondary | ICD-10-CM | POA: Insufficient documentation

## 2020-08-13 DIAGNOSIS — R569 Unspecified convulsions: Secondary | ICD-10-CM | POA: Diagnosis not present

## 2020-08-22 ENCOUNTER — Ambulatory Visit
Admission: RE | Admit: 2020-08-22 | Discharge: 2020-08-22 | Disposition: A | Discharge status: Home / Self Care | Payer: PPO | Source: Ambulatory Visit | Attending: Acute Care | Admitting: Acute Care

## 2020-08-22 ENCOUNTER — Other Ambulatory Visit: Payer: Self-pay

## 2020-08-22 DIAGNOSIS — J449 Chronic obstructive pulmonary disease, unspecified: Secondary | ICD-10-CM | POA: Diagnosis not present

## 2020-08-22 DIAGNOSIS — E669 Obesity, unspecified: Secondary | ICD-10-CM | POA: Diagnosis not present

## 2020-08-22 DIAGNOSIS — F331 Major depressive disorder, recurrent, moderate: Secondary | ICD-10-CM | POA: Diagnosis not present

## 2020-08-22 DIAGNOSIS — G379 Demyelinating disease of central nervous system, unspecified: Secondary | ICD-10-CM | POA: Diagnosis not present

## 2020-08-22 DIAGNOSIS — R42 Dizziness and giddiness: Secondary | ICD-10-CM | POA: Diagnosis not present

## 2020-08-22 DIAGNOSIS — R413 Other amnesia: Secondary | ICD-10-CM | POA: Diagnosis not present

## 2020-08-22 DIAGNOSIS — A419 Sepsis, unspecified organism: Secondary | ICD-10-CM | POA: Diagnosis not present

## 2020-08-22 MED ORDER — GADOBUTROL 1 MMOL/ML IV SOLN
8.0000 mL | Freq: Once | INTRAVENOUS | Status: AC | PRN
  Administered 2020-08-22: 8 mL via INTRAVENOUS

## 2020-09-01 ENCOUNTER — Other Ambulatory Visit: Payer: Self-pay

## 2020-09-01 DIAGNOSIS — R569 Unspecified convulsions: Secondary | ICD-10-CM | POA: Diagnosis not present

## 2020-09-01 MED ORDER — NITROGLYCERIN 0.4 MG SL SUBL: SUBLINGUAL_TABLET | SUBLINGUAL | 0 refills | Status: DC

## 2020-09-08 DIAGNOSIS — M25512 Pain in left shoulder: Secondary | ICD-10-CM | POA: Diagnosis not present

## 2020-09-08 DIAGNOSIS — S299XXA Unspecified injury of thorax, initial encounter: Secondary | ICD-10-CM | POA: Diagnosis not present

## 2020-09-08 DIAGNOSIS — W010XXA Fall on same level from slipping, tripping and stumbling without subsequent striking against object, initial encounter: Secondary | ICD-10-CM | POA: Diagnosis not present

## 2020-09-12 DIAGNOSIS — I639 Cerebral infarction, unspecified: Secondary | ICD-10-CM | POA: Insufficient documentation

## 2020-09-12 DIAGNOSIS — R519 Headache, unspecified: Secondary | ICD-10-CM | POA: Diagnosis not present

## 2020-09-12 DIAGNOSIS — R42 Dizziness and giddiness: Secondary | ICD-10-CM | POA: Diagnosis not present

## 2020-09-12 DIAGNOSIS — R569 Unspecified convulsions: Secondary | ICD-10-CM | POA: Diagnosis not present

## 2020-09-12 DIAGNOSIS — R55 Syncope and collapse: Secondary | ICD-10-CM | POA: Diagnosis not present

## 2020-09-20 ENCOUNTER — Ambulatory Visit: Payer: PPO | Admitting: Internal Medicine

## 2020-09-25 ENCOUNTER — Ambulatory Visit: Payer: PPO | Admitting: Family

## 2020-09-25 NOTE — Progress Notes (Deleted)
Office Visit    Patient Name: Melinda Shaw Date of Encounter: 09/25/2020  Primary Care Provider:  [place], [place] Primary Cardiologist:  Nelva Bush, MD Electrophysiologist:  None   Chief Complaint    Melinda Shaw is a 54 y.o. female with a hx of chest pain, palpitations presents today for ***  Past Medical History    Past Medical History:  Diagnosis Date  . Anemia    H/O  . Anxiety   . Bronchitis   . Chest pain    a. 09/2018 Cath: Nl cors. EF 65%->Med rx (long-acting nitrate added).  . Depression   . Diastolic dysfunction    a. 10/2018 Echo: EF 55-60%, no rwma, Gr1DD. Mild MR. Nl RV fxn.  . DOE (dyspnea on exertion)   . Fibromyalgia   . GERD (gastroesophageal reflux disease)   . Headache   . Hyperlipidemia   . Hypertension    no meds  . Palpitations    a. 01/2019 Zio [facility]): NSR w/ rare PACs & PVCs.  [name] PONV (postoperative nausea and vomiting)    Past Surgical History:  Procedure Laterality Date  . ANTERIOR CRUCIATE LIGAMENT REPAIR    . CARDIAC CATHETERIZATION    . CHOLECYSTECTOMY    . CORONARY ANGIOPLASTY    . ESOPHAGOGASTRODUODENOSCOPY (EGD) WITH PROPOFOL N/A 08/28/2015   Procedure: ESOPHAGOGASTRODUODENOSCOPY (EGD) WITH PROPOFOL;  Surgeon: Josefine Class, MD;  Location: [facility] ENDOSCOPY;  Service: Endoscopy;  Laterality: N/A;  . KNEE SURGERY     left knee   . LEFT HEART CATH AND CORONARY ANGIOGRAPHY Left 09/22/2018   Procedure: LEFT HEART CATH AND CORONARY ANGIOGRAPHY;  Surgeon: Nelva Bush, MD;  Location: [place] CV LAB;  Service: Cardiovascular;  Laterality: Left;  [name] MASS EXCISION Left 01/26/2016   Procedure: EXCISION OF LEFT WRIST VOLAR MASS;  Surgeon: Charlotte Crumb, MD;  Location: [place];  Service: Orthopedics;  Laterality: Left;  . NOSE SURGERY    . POLYPECTOMY    . TUBAL LIGATION    . VIDEO BRONCHOSCOPY N/A 05/29/2015   Procedure: VIDEO BRONCHOSCOPY WITHOUT FLUORO;  Surgeon: Vilinda Boehringer,  MD;  Location: [facility] ORS;  Service: Cardiopulmonary;  Laterality: N/A;    Allergies  Allergies  Allergen Reactions  . Cherry Swelling    Swelling of lips and throat  . Shellfish Allergy Shortness Of Breath  . Sulfa Antibiotics Shortness Of Breath    Stopped breathing, Swelling of throat  . Bee Venom     Swelling of throat  . Imdur [Isosorbide Nitrate]     Worsening headaches     History of Present Illness    Melinda Shaw is a 54 y.o. female with a hx of HTN, HLD, bronchiectasis, fibromyalgia, migraine headaches, anxiety/depression, palpitations last seen 06/15/20  Previous cardiac catheterization in 09/2018 with no coronary artery disease.  Echo 10/2018 with EF 55 to 16%, grade 1 diastolic dysfunction, mild MR.  She has been maintained on amlodipine and propanolol for treatment of possible microvascular dysfunction.  She has been intolerant of isosorbide mononitrate in the past.  At virtual visit 04/2020 she noted palpitations and was mailed a ZIO monitor.  Long-term monitor 06/05/2020 with primarily sinus rhythm with average heart rate of 75 bpm (range 48-141 bpm), rare PAC/PVC.  No significant arrhythmia noted.  ED visit 06/06/2020 with 5-day history of nausea, vomiting, moderate diarrhea.  She had EKG work-up at her PCP prior to being seen in the ED which per report showed ST depressions  in lateral and inferior leads.  She was provided potassium repletion, 1 L of LR.  Blood work showed mild hypokalemia with K3.3, creatinine 0.87, high sensitive troponin less than 2.  Chest x-ray with no acute findings.  She declined repeat EKG and troponin after potassium repletion.  Seen in clinic 06/15/20. Her GI illness had resolved and continued intermittent palpitations. She was provided Diltiazem 30mg  PRN for palpitations.  ***  EKGs/Labs/Other Studies Reviewed:   The following studies were reviewed today:  Monitor 06/05/20  The patient was monitored for 12 days, 23 hours.  The  predominant rhythm was sinus with an average rate of 75 bpm (range 48-141 bpm).  There were rare PAC's and PVC's.  No significant arrhythmia or prolonged pause was observed.  There were no patient triggered events.   Predominantly sinus rhythm with rare PAC's and PVC's.  No significant arrhythmia noted.  EKG:  EKG is ordered today.  The ekg ordered today demonstrates NSR 74 bpm with no acute St/T wave changes - when compared to prior EKG her ST depression in inferolateral and anterolateral leads has resolved. ***  Recent Labs: 12/03/2019: ALT 47; TSH 2.429 06/06/2020: Hemoglobin 14.9; Platelets 329 06/15/2020: BUN 8; Creatinine, Ser 0.85; Potassium 4.5; Sodium 140  Recent Lipid Panel No results found for: CHOL, TRIG, HDL, CHOLHDL, VLDL, LDLCALC, LDLDIRECT  Home Medications   No outpatient medications have been marked as taking for the 09/25/20 encounter (Appointment) with Loel Dubonnet, NP.      Review of Systems   ***  All other systems reviewed and are otherwise negative except as noted above.  Physical Exam   *** VS:  LMP 09/24/2016 (Approximate)  , BMI There is no height or weight on file to calculate BMI. GEN: Well nourished, well developed, in no acute distress. HEENT: normal. Neck: Supple, no JVD, carotid bruits, or masses. Cardiac: RRR, no murmurs, rubs, or gallops. No clubbing, cyanosis, edema.  Radials/DP/PT 2+ and equal bilaterally.  Respiratory:  Respirations regular and unlabored, clear to auscultation bilaterally. GI: Soft, nontender, nondistended, BS + x 4. MS: No deformity or atrophy. Skin: Warm and dry, no rash. Neuro:  Strength and sensation are intact. Anxious. Psych: Normal affect.  Assessment & Plan   *** 1. Palpitations -ZIO monitor with infrequent PVC/PAC. She is very bothered by her palpitations. We discussed triggers including caffeine, stress, dehydration. Continue propranolol 80 mg daily. Start diltiazem 30 mg daily as needed for  palpitations.  2. Chest pain in adult/abnormal EKG/hypokalemia-prior cardiac catheterization November 2019 with no coronary artery disease. Reports when she has palpitations she will intermittently feel a chest tightness. This chest tightness is not associate with exertion and always occurs with palpitations. She had recent ED visit with ST depressions in anterior lateral and inferior lateral leads in the setting of hypokalemia and acute GI illness as well as dehydration. Her EKG today shows normal sinus rhythm and those previous ST depressions have resolved. Her symptoms are very atypical for angina and there is no indication for ischemic evaluation at this time. We'll repeat BMP today to ensure her potassium is returned to normal.  3. Nausea -encouraged to continue to follow with PCP. Provided short refill prescription for Zofran but further refills will need to come from her primary care provider. She verbalized understanding.  4. HTN - BP well controlled. Continue current antihypertensive regimen.   5. Migraine - encouraged to follow with PCP/neurology. Will defer transition of Metoprolol to alternate BB as she is presently having migraine.  Disposition: Follow up*** in 3 month(s) with Dr. Saunders Revel or APP   Loel Dubonnet, NP 09/25/2020, 8:26 AM

## 2020-09-26 ENCOUNTER — Encounter: Payer: Self-pay | Admitting: Family

## 2020-10-10 DIAGNOSIS — M797 Fibromyalgia: Secondary | ICD-10-CM | POA: Diagnosis not present

## 2020-10-10 DIAGNOSIS — E669 Obesity, unspecified: Secondary | ICD-10-CM | POA: Diagnosis not present

## 2020-10-12 ENCOUNTER — Other Ambulatory Visit: Payer: Self-pay | Admitting: Internal Medicine

## 2020-10-13 DIAGNOSIS — R569 Unspecified convulsions: Secondary | ICD-10-CM | POA: Diagnosis not present

## 2020-10-13 NOTE — Telephone Encounter (Signed)
Please schedule overdue 3 month F/U. Patient did not show for last appointment. Thank you!

## 2020-10-13 NOTE — Telephone Encounter (Signed)
Scheduled for Tuesday w/Visser

## 2020-10-14 DIAGNOSIS — R569 Unspecified convulsions: Secondary | ICD-10-CM | POA: Diagnosis not present

## 2020-10-15 DIAGNOSIS — R569 Unspecified convulsions: Secondary | ICD-10-CM | POA: Diagnosis not present

## 2020-10-17 ENCOUNTER — Ambulatory Visit: Payer: PPO | Admitting: Physician Assistant

## 2020-10-24 DIAGNOSIS — R519 Headache, unspecified: Secondary | ICD-10-CM | POA: Diagnosis not present

## 2020-10-24 DIAGNOSIS — R413 Other amnesia: Secondary | ICD-10-CM | POA: Diagnosis not present

## 2020-10-24 DIAGNOSIS — R569 Unspecified convulsions: Secondary | ICD-10-CM | POA: Diagnosis not present

## 2020-10-24 DIAGNOSIS — R42 Dizziness and giddiness: Secondary | ICD-10-CM | POA: Diagnosis not present

## 2020-10-24 DIAGNOSIS — R55 Syncope and collapse: Secondary | ICD-10-CM | POA: Diagnosis not present

## 2020-11-17 ENCOUNTER — Ambulatory Visit: Payer: PPO | Admitting: Physician Assistant

## 2020-11-23 DIAGNOSIS — J449 Chronic obstructive pulmonary disease, unspecified: Secondary | ICD-10-CM | POA: Diagnosis not present

## 2020-11-23 DIAGNOSIS — I519 Heart disease, unspecified: Secondary | ICD-10-CM | POA: Diagnosis not present

## 2020-12-08 DIAGNOSIS — I639 Cerebral infarction, unspecified: Secondary | ICD-10-CM | POA: Diagnosis not present

## 2020-12-08 DIAGNOSIS — Z1239 Encounter for other screening for malignant neoplasm of breast: Secondary | ICD-10-CM | POA: Diagnosis not present

## 2020-12-08 DIAGNOSIS — E559 Vitamin D deficiency, unspecified: Secondary | ICD-10-CM | POA: Diagnosis not present

## 2020-12-08 DIAGNOSIS — Z23 Encounter for immunization: Secondary | ICD-10-CM | POA: Diagnosis not present

## 2020-12-08 DIAGNOSIS — K7400 Hepatic fibrosis, unspecified: Secondary | ICD-10-CM | POA: Diagnosis not present

## 2020-12-08 DIAGNOSIS — I493 Ventricular premature depolarization: Secondary | ICD-10-CM | POA: Diagnosis not present

## 2020-12-08 DIAGNOSIS — R109 Unspecified abdominal pain: Secondary | ICD-10-CM | POA: Diagnosis not present

## 2020-12-08 DIAGNOSIS — C181 Malignant neoplasm of appendix: Secondary | ICD-10-CM | POA: Diagnosis not present

## 2020-12-08 DIAGNOSIS — Z1211 Encounter for screening for malignant neoplasm of colon: Secondary | ICD-10-CM | POA: Diagnosis not present

## 2020-12-27 DIAGNOSIS — R109 Unspecified abdominal pain: Secondary | ICD-10-CM | POA: Diagnosis not present

## 2020-12-28 NOTE — Progress Notes (Unsigned)
Follow-up Outpatient Visit Date: 12/29/2020  Primary Care Provider: [facility], [place] [address]  Chief Complaint: Palpitations and chest pain  HPI:  Melinda Shaw is a 55 y.o. female with history of hypertension, hyperlipidemia, bronchiectasis, fibromyalgia, migraine headaches, and anxiety/depression, who presents for follow-up of palpitations and hypertension.  She was last seen in our office in 06/2020, at which time she reported continued intermittent palpitations.  Prior event monitor showed rare PAC's and PVC's without significant arrhythmia.  She was started on diltiazem 30 mg daily as needed for palpitations.  Today, Melinda Shaw reports that she continues to have intermittent chest tightness and palpitations.  The symptoms are not exertional but happen on daily basis.  She notes improvement with the as needed diltiazem prescribed at our last visit.  She is taking it most days.  She notes occasional lightheadedness and even passed out once in the bathroom several months ago.  She reports having a lot of "neurologic issues" at that time, though she reports that an extensive neurologic workup did not reveal any significant abnormalities.  She is no longer taking propranolol (she does not remember why).  It was initially prescribed for headache prophylaxis.  --------------------------------------------------------------------------------------------------  Past Medical History:  Diagnosis Date  . Anemia    H/O  . Anxiety   . Bronchitis   . Chest pain    a. 09/2018 Cath: Nl cors. EF 65%->Med rx (long-acting nitrate added).  . Depression   . Diastolic dysfunction    a. 10/2018 Echo: EF 55-60%, no rwma, Gr1DD. Mild MR. Nl RV fxn.  . DOE (dyspnea on exertion)   . Fibromyalgia   . GERD (gastroesophageal reflux disease)   . Headache   . Hyperlipidemia   . Hypertension    no meds  . Palpitations    a. 01/2019 [facility]): NSR w/ rare PACs & PVCs.  [name]  PONV (postoperative nausea and vomiting)    Past Surgical History:  Procedure Laterality Date  . ANTERIOR CRUCIATE LIGAMENT REPAIR    . CARDIAC CATHETERIZATION    . CHOLECYSTECTOMY    . CORONARY ANGIOPLASTY    . ESOPHAGOGASTRODUODENOSCOPY (EGD) WITH PROPOFOL N/A 08/28/2015   Procedure: ESOPHAGOGASTRODUODENOSCOPY (EGD) WITH PROPOFOL;  Surgeon: Josefine Class, MD;  Location: [facility] ENDOSCOPY;  Service: Endoscopy;  Laterality: N/A;  . KNEE SURGERY     left knee   . LEFT HEART CATH AND CORONARY ANGIOGRAPHY Left 09/22/2018   Procedure: LEFT HEART CATH AND CORONARY ANGIOGRAPHY;  Surgeon: Nelva Bush, MD;  Location: [place] CV LAB;  Service: Cardiovascular;  Laterality: Left;  [name] MASS EXCISION Left 01/26/2016   Procedure: EXCISION OF LEFT WRIST VOLAR MASS;  Surgeon: Charlotte Crumb, MD;  Location: [place];  Service: Orthopedics;  Laterality: Left;  . NOSE SURGERY    . POLYPECTOMY    . TUBAL LIGATION    . VIDEO BRONCHOSCOPY N/A 05/29/2015   Procedure: VIDEO BRONCHOSCOPY WITHOUT FLUORO;  Surgeon: Vilinda Boehringer, MD;  Location: [facility] ORS;  Service: Cardiopulmonary;  Laterality: N/A;    Current Meds  Medication Sig  . albuterol (PROVENTIL HFA;VENTOLIN HFA) 108 (90 BASE) MCG/ACT inhaler Inhale 2 puffs into the lungs every 6 (six) hours as needed for wheezing or shortness of breath.  [name] amLODipine (NORVASC) 5 MG tablet TAKE ONE TABLET BY MOUTH DAILY  . clonazePAM (KLONOPIN) 0.5 MG tablet Take 0.5 mg by mouth 2 (two) times daily as needed.  . cyclobenzaprine (FLEXERIL) 10 MG tablet Take 10  mg by mouth at bedtime.  [name] diltiazem (CARDIZEM) 30 MG tablet Take 1 tablet (30 mg total) by mouth daily as needed (Daily as needed for palpitations).  . eletriptan (RELPAX) 20 MG tablet Take 20 mg by mouth every 2 (two) hours as needed for migraine.  [name] EPINEPHrine 0.3 mg/0.3 mL IJ SOAJ injection Inject 0.3 mg into the muscle as needed for anaphylaxis.  [name] gabapentin (NEURONTIN) 400 MG  capsule Take 400 mg by mouth 3 (three) times daily.  . hydrOXYzine (VISTARIL) 25 MG capsule Take 25 mg by mouth 3 (three) times daily.  [name] lithium carbonate 300 MG capsule Take 600 mg by mouth at bedtime.  . metaxalone (SKELAXIN) 800 MG tablet Take by mouth.  . mirtazapine (REMERON) 30 MG tablet Take 30 mg by mouth at bedtime.  . mometasone-formoterol (DULERA) 200-5 MCG/ACT AERO Inhale 2 puffs into the lungs 2 (two) times daily.  . nitroGLYCERIN (NITROSTAT) 0.4 MG SL tablet PLACE ONE TABLET UNDER THE TONGUE EVERY 5 MINUTES AS NEEDED FOR CHEST PAIN. MAXIMUM OF 3 DOSES.  [name] omeprazole (PRILOSEC) 40 MG capsule Take 40 mg by mouth 2 (two) times daily.   . ondansetron (ZOFRAN) 4 MG tablet Take 1 tablet (4 mg total) by mouth every 8 (eight) hours as needed for nausea or vomiting. Further refills to come from primary care provider.  [name] tiZANidine (ZANAFLEX) 4 MG tablet Take 4 mg by mouth at bedtime.  . topiramate (TOPAMAX) 50 MG tablet Taking 100 mg at night  . venlafaxine XR (EFFEXOR-XR) 150 MG 24 hr capsule Take 150 mg by mouth daily.  [name] zolpidem (AMBIEN) 10 MG tablet Take 10 mg by mouth at bedtime.    Allergies: Cherry, Shellfish allergy, Sulfa antibiotics, Bee venom, and Imdur [isosorbide nitrate]  Social History   Tobacco Use  . Smoking status: Never Smoker  . Smokeless tobacco: Never Used  Vaping Use  . Vaping Use: Never used  Substance Use Topics  . Alcohol use: No    Alcohol/week: 0.0 standard drinks  . Drug use: No    Family History  Problem Relation Age of Onset  . COPD Mother   . High Cholesterol Mother   . Heart failure Mother   . Hypertension Mother   . Hypertension Father   . AAA (abdominal aortic aneurysm) Sister   . COPD Sister   . High Cholesterol Sister     Review of Systems: A 12-system review of systems was performed and was negative except as noted in the  HPI.  --------------------------------------------------------------------------------------------------  Physical Exam: BP 114/88 (BP Location: Left Arm, Patient Position: Sitting, Cuff Size: Normal)   Pulse 83   Ht 5\' 2"  (1.575 m)   Wt 160 lb 6 oz (72.7 kg)   LMP 09/24/2016 (Approximate)   SpO2 98%   BMI 29.33 kg/m   General:  NAD. Neck: No JVD or HJR. Lungs: Clear to auscultation bilaterally without wheezes or crackles. Heart: Regular rate and rhythm without murmurs, rubs, or gallops. Abdomen: Soft, nontender, nondistended. Extremities: No lower extremity edema.  EKG:  Normal sinus rhythm with inferolateral ST/T changes, significantly more pronounced than prior tracing on 06/15/2020 but similar to 06/06/2020.  Lab Results  Component Value Date   WBC 9.5 06/06/2020   HGB 14.9 06/06/2020   HCT 44.3 06/06/2020   MCV 93.9 06/06/2020   PLT 329 06/06/2020    Lab Results  Component Value Date   NA 140 06/15/2020   K 4.5 06/15/2020   CL 102 06/15/2020  CO2 27 06/15/2020   BUN 8 06/15/2020   CREATININE 0.85 06/15/2020   GLUCOSE 109 (H) 06/15/2020   ALT 47 (H) 12/03/2019    No results found for: CHOL, HDL, LDLCALC, LDLDIRECT, TRIG, CHOLHDL  --------------------------------------------------------------------------------------------------  ASSESSMENT AND PLAN: Chest pain and palpitations: Chest tightness remains atypical and may reflect vasospasm and/or microvascular dysfunction given no significant CAD on catheterization in 2019.  However, symptoms have remained frequent and EKG today shows inferolateral ST/T changes.  These EKG changes have been present in the past but seem more pronounced today.  My suspicion for atherosclerotic CAD remains low, but in light of persistent symptoms and EKG changes, we have agreed to obtain a coronary CTA.  As her symptoms have been responsive to as needed diltiaem, we will add standing diltiazem 180 mg daily in place of amlodipine.  She can  continue to use diltiazem 30 mg daily as needed for breakthrough symptoms.  I will defer restarting propranolol to her neurologist.  Hypertension: Blood pressure reasonable today.  Discontinue amlodipine and add diltiazem, as above.  Syncope: Circumstance surrounding this event several months ago is unclear.  Prior echo's and event monitor have been without significant abnormalities.  We will proceed with CTA, as above, but otherwise defer additional testing at this time.  Follow-up: Return to clinic after completion of coronary CTA.  Nelva Bush, MD 12/29/2020 9:53 AM

## 2020-12-29 ENCOUNTER — Other Ambulatory Visit: Payer: Self-pay

## 2020-12-29 ENCOUNTER — Encounter: Payer: Self-pay | Admitting: Internal Medicine

## 2020-12-29 ENCOUNTER — Ambulatory Visit: Payer: PPO | Admitting: Internal Medicine

## 2020-12-29 VITALS — BP 114/88 | HR 83 | Ht 62.0 in | Wt 160.4 lb

## 2020-12-29 DIAGNOSIS — R079 Chest pain, unspecified: Secondary | ICD-10-CM

## 2020-12-29 DIAGNOSIS — I10 Essential (primary) hypertension: Secondary | ICD-10-CM

## 2020-12-29 DIAGNOSIS — R55 Syncope and collapse: Secondary | ICD-10-CM | POA: Insufficient documentation

## 2020-12-29 DIAGNOSIS — R002 Palpitations: Secondary | ICD-10-CM

## 2020-12-29 DIAGNOSIS — R9431 Abnormal electrocardiogram [ECG] [EKG]: Secondary | ICD-10-CM

## 2020-12-29 MED ORDER — DILTIAZEM HCL 30 MG PO TABS: 30.0000 mg | ORAL_TABLET | Freq: Every day | ORAL | 2 refills | Status: DC | PRN

## 2020-12-29 MED ORDER — METOPROLOL TARTRATE 100 MG PO TABS
100.0000 mg | ORAL_TABLET | Freq: Once | ORAL | 0 refills | Status: DC
Start: 2020-12-29 — End: 2021-03-02

## 2020-12-29 MED ORDER — DILTIAZEM HCL ER COATED BEADS 180 MG PO CP24
180.0000 mg | ORAL_CAPSULE | Freq: Every day | ORAL | 1 refills | Status: DC
Start: 2020-12-29 — End: 2021-06-29

## 2020-12-29 NOTE — Patient Instructions (Addendum)
Medication Instructions:  Your physician has recommended you make the following change in your medication:  1- STOP Amlodipine. 2- REMAIN off Propranolol - please check with your neurologist about if you should continue. 3- START Diltiazem ER  180 mg by mouth once a day.  *If you need a refill on your cardiac medications before your next appointment, please call your pharmacy*   Lab Work: Your physician recommends that you return for lab work within 1 week of the Cardiac CT once it has been scheduled. - for BMET. - Please go to the [facility]. You will check in at the front desk to the right as you walk into the atrium. Valet Parking is offered if needed. - No appointment needed. You may go any day between 7 am and 6 pm.   If you have labs (blood work) drawn today and your tests are completely normal, you will receive your results only by: [name] MyChart Message (if you have MyChart) OR . A paper copy in the mail If you have any lab test that is abnormal or we need to change your treatment, we will call you to review the results.   Testing/Procedures:  Your cardiac CT will be scheduled at one of the below locations:   [facility] [address] [place], [place] [address] [phone]  [facility] [address] [place],  [address] [phone]  If scheduled at [facility], please arrive at the [facility] main entrance (entrance A) of [facility] 30 minutes prior to test start time. Proceed to the [facility] Radiology Department (first floor) to check-in and test prep.  If scheduled at [facility], please arrive 15 mins early for check-in and test prep.  Please follow these instructions carefully (unless otherwise directed):  On the Night Before the Test: . Be sure to Drink plenty of water. . Do not consume any caffeinated/decaffeinated beverages or chocolate  12 hours prior to your test. . Do not take any antihistamines 12 hours prior to your test.  On the Day of the Test: . Drink plenty of water until 1 hour prior to the test. . Do not eat any food 4 hours prior to the test. . You may take your regular medications prior to the test.  . Take metoprolol (Lopressor) two hours prior to test. - . HOLD Furosemide/Hydrochlorothiazide morning of the test. . FEMALES- please wear underwire-free bra if available     After the Test: . Drink plenty of water. . After receiving IV contrast, you may experience a mild flushed feeling. This is normal. . On occasion, you may experience a mild rash up to 24 hours after the test. This is not dangerous. If this occurs, you can take Benadryl 25 mg and increase your fluid intake. . If you experience trouble breathing, this can be serious. If it is severe call 911 IMMEDIATELY. If it is mild, please call our office. . If you take any of these medications: Glipizide/Metformin, Avandament, Glucavance, please do not take 48 hours after completing test unless otherwise instructed.   Once we have confirmed authorization from your insurance company, we will call you to set up a date and time for your test. Based on how quickly your insurance processes prior authorizations requests, please allow up to 4 weeks to be contacted for scheduling your Cardiac CT appointment. Be advised that routine Cardiac CT appointments could be scheduled as many as  8 weeks after your provider has ordered it.  For non-scheduling related questions, please contact the cardiac imaging nurse navigator should you have any questions/concerns: Marchia Bond, Cardiac Imaging Nurse Navigator Gordy Clement, Cardiac Imaging Nurse Navigator [facility] Direct Office Dial: [phone]   For scheduling needs, including cancellations and rescheduling, please call [place], [phone].   Follow-Up: At [facility], you and your  health needs are our priority.  As part of our continuing mission to provide you with exceptional heart care, we have created designated Provider Care Teams.  These Care Teams include your primary Cardiologist (physician) and Advanced Practice Providers (APPs -  Physician Assistants and Nurse Practitioners) who all work together to provide you with the care you need, when you need it.  We recommend signing up for the patient portal called "MyChart".  Sign up information is provided on this After Visit Summary.  MyChart is used to connect with patients for Virtual Visits (Telemedicine).  Patients are able to view lab/test results, encounter notes, upcoming appointments, etc.  Non-urgent messages can be sent to your provider as well.   To learn more about what you can do with MyChart, go to [email or web address].    Your next appointment:   5-6 week(s)  The format for your next appointment:   In Person  Provider:   You may see Nelva Bush, MD or one of the following Advanced Practice Providers on your designated Care Team:    Murray Hodgkins, NP  Christell Faith, PA-C  Marrianne Mood, PA-C  Cadence [place], [place]  Laurann [place], NP   Cardiac CT Angiogram A cardiac CT angiogram is a procedure to look at the heart and the area around the heart. It may be done to help find the cause of chest pains or other symptoms of heart disease. During this procedure, a substance called contrast dye is injected into the blood vessels in the area to be checked. A large X-ray machine, called a CT scanner, then takes detailed pictures of the heart and the surrounding area. The procedure is also sometimes called a coronary CT angiogram, coronary artery scanning, or CTA. A cardiac CT angiogram allows the health care provider to see how well blood is flowing to and from the heart. The health care provider will be able to see if there are any problems, such as:  Blockage or narrowing of the coronary arteries  in the heart.  Fluid around the heart.  Signs of weakness or disease in the muscles, valves, and tissues of the heart. Tell a health care provider about:  Any allergies you have. This is especially important if you have had a previous allergic reaction to contrast dye.  All medicines you are taking, including vitamins, herbs, eye drops, creams, and over-the-counter medicines.  Any blood disorders you have.  Any surgeries you have had.  Any medical conditions you have.  Whether you are pregnant or may be pregnant.  Any anxiety disorders, chronic pain, or other conditions you have that may increase your stress or prevent you from lying still. What are the risks? Generally, this is a safe procedure. However, problems may occur, including:  Bleeding.  Infection.  Allergic reactions to medicines or dyes.  Damage to other structures or organs.  Kidney damage from the contrast dye that is used.  Increased risk of cancer from radiation exposure. This risk is low. Talk with your health care provider about: ? The risks and benefits of testing. ? How you can receive  the lowest dose of radiation. What happens before the procedure?  Wear comfortable clothing and remove any jewelry, glasses, dentures, and hearing aids.  Follow instructions from your health care provider about eating and drinking. This may include: ? For 12 hours before the procedure -- avoid caffeine. This includes tea, coffee, soda, energy drinks, and diet pills. Drink plenty of water or other fluids that do not have caffeine in them. Being well hydrated can prevent complications. ? For 4-6 hours before the procedure -- stop eating and drinking. The contrast dye can cause nausea, but this is less likely if your stomach is empty.  Ask your health care provider about changing or stopping your regular medicines. This is especially important if you are taking diabetes medicines, blood thinners, or medicines to treat  problems with erections (erectile dysfunction). What happens during the procedure?  Hair on your chest may need to be removed so that small sticky patches called electrodes can be placed on your chest. These will transmit information that helps to monitor your heart during the procedure.  An IV will be inserted into one of your veins.  You might be given a medicine to control your heart rate during the procedure. This will help to ensure that good images are obtained.  You will be asked to lie on an exam table. This table will slide in and out of the CT machine during the procedure.  Contrast dye will be injected into the IV. You might feel warm, or you may get a metallic taste in your mouth.  You will be given a medicine called nitroglycerin. This will relax or dilate the arteries in your heart.  The table that you are lying on will move into the CT machine tunnel for the scan.  The person running the machine will give you instructions while the scans are being done. You may be asked to: ? Keep your arms above your head. ? Hold your breath. ? Stay very still, even if the table is moving.  When the scanning is complete, you will be moved out of the machine.  The IV will be removed. The procedure may vary among health care providers and hospitals.   What can I expect after the procedure? After your procedure, it is common to have:  A metallic taste in your mouth from the contrast dye.  A feeling of warmth.  A headache from the nitroglycerin. Follow these instructions at home:  Take over-the-counter and prescription medicines only as told by your health care provider.  If you are told, drink enough fluid to keep your urine pale yellow. This will help to flush the contrast dye out of your body.  Most people can return to their normal activities right after the procedure. Ask your health care provider what activities are safe for you.  It is up to you to get the results of your  procedure. Ask your health care provider, or the department that is doing the procedure, when your results will be ready.  Keep all follow-up visits as told by your health care provider. This is important. Contact a health care provider if:  You have any symptoms of allergy to the contrast dye. These include: ? Shortness of breath. ? Rash or hives. ? A racing heartbeat. Summary  A cardiac CT angiogram is a procedure to look at the heart and the area around the heart. It may be done to help find the cause of chest pains or other symptoms of heart disease.  During this procedure, a large X-ray machine, called a CT scanner, takes detailed pictures of the heart and the surrounding area after a contrast dye has been injected into blood vessels in the area.  Ask your health care provider about changing or stopping your regular medicines before the procedure. This is especially important if you are taking diabetes medicines, blood thinners, or medicines to treat erectile dysfunction.  If you are told, drink enough fluid to keep your urine pale yellow. This will help to flush the contrast dye out of your body. This information is not intended to replace advice given to you by your health care provider. Make sure you discuss any questions you have with your health care provider. Document Revised: 06/23/2019 Document Reviewed: 06/23/2019 Elsevier Patient Education  2021 [employer].

## 2021-01-08 DIAGNOSIS — E785 Hyperlipidemia, unspecified: Secondary | ICD-10-CM | POA: Diagnosis not present

## 2021-01-08 DIAGNOSIS — C181 Malignant neoplasm of appendix: Secondary | ICD-10-CM | POA: Diagnosis not present

## 2021-01-08 DIAGNOSIS — Z1239 Encounter for other screening for malignant neoplasm of breast: Secondary | ICD-10-CM | POA: Diagnosis not present

## 2021-01-08 DIAGNOSIS — Z0000 Encounter for general adult medical examination without abnormal findings: Secondary | ICD-10-CM | POA: Diagnosis not present

## 2021-01-08 DIAGNOSIS — I639 Cerebral infarction, unspecified: Secondary | ICD-10-CM | POA: Diagnosis not present

## 2021-01-08 DIAGNOSIS — I493 Ventricular premature depolarization: Secondary | ICD-10-CM | POA: Diagnosis not present

## 2021-01-08 DIAGNOSIS — E559 Vitamin D deficiency, unspecified: Secondary | ICD-10-CM | POA: Diagnosis not present

## 2021-01-08 DIAGNOSIS — R634 Abnormal weight loss: Secondary | ICD-10-CM | POA: Diagnosis not present

## 2021-01-08 DIAGNOSIS — K7400 Hepatic fibrosis, unspecified: Secondary | ICD-10-CM | POA: Diagnosis not present

## 2021-01-09 ENCOUNTER — Other Ambulatory Visit: Payer: Self-pay | Admitting: Internal Medicine

## 2021-01-10 ENCOUNTER — Telehealth (HOSPITAL_COMMUNITY): Payer: Self-pay | Admitting: *Deleted

## 2021-01-10 NOTE — Telephone Encounter (Signed)
Attempted to call patient regarding upcoming cardiac CT appointment. °Left message on voicemail with name and callback number ° °Suezette Lafave RN Navigator Cardiac Imaging °[facility] °[phone] Office °[phone] Cell ° °

## 2021-01-10 NOTE — Telephone Encounter (Signed)
Pt returning call regarding upcoming cardiac imaging study; pt verbalizes understanding of appt date/time, parking situation and where to check in, pre-test NPO status and medications ordered, and verified current allergies; name and call back number provided for further questions should they arise  Jacobi Nile RN Navigator Cardiac Imaging [facility] [phone] office [phone] cell  

## 2021-01-11 ENCOUNTER — Ambulatory Visit
Admission: RE | Admit: 2021-01-11 | Discharge: 2021-01-11 | Disposition: A | Discharge status: Home / Self Care | Payer: PPO | Source: Ambulatory Visit | Attending: Internal Medicine | Admitting: Internal Medicine

## 2021-01-11 ENCOUNTER — Other Ambulatory Visit: Payer: Self-pay

## 2021-01-11 DIAGNOSIS — R079 Chest pain, unspecified: Secondary | ICD-10-CM | POA: Insufficient documentation

## 2021-01-11 DIAGNOSIS — R9431 Abnormal electrocardiogram [ECG] [EKG]: Secondary | ICD-10-CM | POA: Diagnosis not present

## 2021-01-11 LAB — POCT I-STAT CREATININE: Creatinine, Ser: 0.8 mg/dL (ref 0.44–1.00)

## 2021-01-11 MED ORDER — NITROGLYCERIN 0.4 MG SL SUBL
0.4000 mg | SUBLINGUAL_TABLET | Freq: Once | SUBLINGUAL | Status: AC
  Administered 2021-01-11: 0.4 mg via SUBLINGUAL

## 2021-01-11 MED ORDER — IOHEXOL 350 MG/ML SOLN
80.0000 mL | Freq: Once | INTRAVENOUS | Status: AC | PRN
  Administered 2021-01-11: 80 mL via INTRAVENOUS

## 2021-01-11 NOTE — Progress Notes (Signed)
Patient tolerated procedure well. Ambulate w/o difficulty. Sitting in chair drinking water provided. Encouraged to drink extra water today and reasoning explained. Verbalized understanding. All questions answered. ABC intact. No further needs. Discharge from procedure area w/o issues.  

## 2021-01-21 ENCOUNTER — Other Ambulatory Visit: Payer: Self-pay | Admitting: Internal Medicine

## 2021-01-23 DIAGNOSIS — I639 Cerebral infarction, unspecified: Secondary | ICD-10-CM | POA: Diagnosis not present

## 2021-01-23 DIAGNOSIS — E785 Hyperlipidemia, unspecified: Secondary | ICD-10-CM | POA: Diagnosis not present

## 2021-01-29 DIAGNOSIS — Z1159 Encounter for screening for other viral diseases: Secondary | ICD-10-CM | POA: Diagnosis not present

## 2021-01-29 DIAGNOSIS — K7400 Hepatic fibrosis, unspecified: Secondary | ICD-10-CM | POA: Diagnosis not present

## 2021-01-29 DIAGNOSIS — R634 Abnormal weight loss: Secondary | ICD-10-CM | POA: Diagnosis not present

## 2021-01-29 DIAGNOSIS — Z7289 Other problems related to lifestyle: Secondary | ICD-10-CM | POA: Diagnosis not present

## 2021-02-21 DIAGNOSIS — J449 Chronic obstructive pulmonary disease, unspecified: Secondary | ICD-10-CM | POA: Diagnosis not present

## 2021-02-21 DIAGNOSIS — E785 Hyperlipidemia, unspecified: Secondary | ICD-10-CM | POA: Diagnosis not present

## 2021-02-21 DIAGNOSIS — F4312 Post-traumatic stress disorder, chronic: Secondary | ICD-10-CM | POA: Diagnosis not present

## 2021-02-22 ENCOUNTER — Ambulatory Visit: Payer: PPO | Admitting: Internal Medicine

## 2021-03-02 ENCOUNTER — Encounter: Payer: Self-pay | Admitting: Family

## 2021-03-02 ENCOUNTER — Other Ambulatory Visit: Payer: Self-pay

## 2021-03-02 ENCOUNTER — Ambulatory Visit (INDEPENDENT_AMBULATORY_CARE_PROVIDER_SITE_OTHER): Payer: PPO | Admitting: Family

## 2021-03-02 VITALS — BP 128/80 | HR 62 | Ht 62.0 in | Wt 156.0 lb

## 2021-03-02 DIAGNOSIS — Z0000 Encounter for general adult medical examination without abnormal findings: Secondary | ICD-10-CM | POA: Diagnosis not present

## 2021-03-02 DIAGNOSIS — C181 Malignant neoplasm of appendix: Secondary | ICD-10-CM | POA: Diagnosis not present

## 2021-03-02 DIAGNOSIS — R0789 Other chest pain: Secondary | ICD-10-CM | POA: Diagnosis not present

## 2021-03-02 DIAGNOSIS — R55 Syncope and collapse: Secondary | ICD-10-CM

## 2021-03-02 DIAGNOSIS — I10 Essential (primary) hypertension: Secondary | ICD-10-CM

## 2021-03-02 DIAGNOSIS — R109 Unspecified abdominal pain: Secondary | ICD-10-CM | POA: Diagnosis not present

## 2021-03-02 DIAGNOSIS — Z23 Encounter for immunization: Secondary | ICD-10-CM | POA: Diagnosis not present

## 2021-03-02 DIAGNOSIS — R197 Diarrhea, unspecified: Secondary | ICD-10-CM | POA: Diagnosis not present

## 2021-03-02 DIAGNOSIS — R002 Palpitations: Secondary | ICD-10-CM | POA: Diagnosis not present

## 2021-03-02 DIAGNOSIS — K760 Fatty (change of) liver, not elsewhere classified: Secondary | ICD-10-CM | POA: Diagnosis not present

## 2021-03-02 MED ORDER — DILTIAZEM HCL 30 MG PO TABS: 30.0000 mg | ORAL_TABLET | ORAL | 5 refills | Status: DC | PRN

## 2021-03-02 NOTE — Progress Notes (Signed)
Office Visit    Patient Name: Melinda Shaw Date of Encounter: 03/02/2021  PCP:  [facility]  Cardiologist:  Nelva Bush, MD  Advanced Practice Provider:  No care team member to display Electrophysiologist:  None    Chief Complaint    Melinda Shaw is a 55 y.o. female with a hx of hypertension, hyperlipidemia, bronchiectasis, fibromyalgias, migraine headaches, anxiety/depression, palpitations, no coronary artery calcification by cardiac CTA 01/2021 presents today for follow up after cardiac CTA.   Past Medical History    Past Medical History:  Diagnosis Date  . Anemia    H/O  . Anxiety   . Bronchitis   . Chest pain    a. 09/2018 Cath: Nl cors. EF 65%->Med rx (long-acting nitrate added).  . Depression   . Diastolic dysfunction    a. 10/2018 Echo: EF 55-60%, no rwma, Gr1DD. Mild MR. Nl RV fxn.  . DOE (dyspnea on exertion)   . Fibromyalgia   . GERD (gastroesophageal reflux disease)   . Headache   . Hyperlipidemia   . Hypertension    no meds  . Palpitations    a. 01/2019 [facility]): NSR w/ rare PACs & PVCs.  [name] PONV (postoperative nausea and vomiting)    Past Surgical History:  Procedure Laterality Date  . ANTERIOR CRUCIATE LIGAMENT REPAIR    . CARDIAC CATHETERIZATION    . CHOLECYSTECTOMY    . CORONARY ANGIOPLASTY    . ESOPHAGOGASTRODUODENOSCOPY (EGD) WITH PROPOFOL N/A 08/28/2015   Procedure: ESOPHAGOGASTRODUODENOSCOPY (EGD) WITH PROPOFOL;  Surgeon: Josefine Class, MD;  Location: [facility] ENDOSCOPY;  Service: Endoscopy;  Laterality: N/A;  . KNEE SURGERY     left knee   . LEFT HEART CATH AND CORONARY ANGIOGRAPHY Left 09/22/2018   Procedure: LEFT HEART CATH AND CORONARY ANGIOGRAPHY;  Surgeon: Nelva Bush, MD;  Location: [place] CV LAB;  Service: Cardiovascular;  Laterality: Left;  [name] MASS EXCISION Left 01/26/2016   Procedure: EXCISION OF LEFT WRIST VOLAR MASS;  Surgeon: Charlotte Crumb, MD;   Location: [place];  Service: Orthopedics;  Laterality: Left;  . NOSE SURGERY    . POLYPECTOMY    . TUBAL LIGATION    . VIDEO BRONCHOSCOPY N/A 05/29/2015   Procedure: VIDEO BRONCHOSCOPY WITHOUT FLUORO;  Surgeon: Vilinda Boehringer, MD;  Location: [facility] ORS;  Service: Cardiopulmonary;  Laterality: N/A;    Allergies  Allergies  Allergen Reactions  . Cherry Swelling    Swelling of lips and throat  . Shellfish Allergy Shortness Of Breath  . Sulfa Antibiotics Shortness Of Breath    Stopped breathing, Swelling of throat  . Bee Venom     Swelling of throat  . Imdur [Isosorbide Nitrate]     Worsening headaches     History of Present Illness    Melinda Shaw is a 55 y.o. female with a hx of hypertension, hyperlipidemia, bronchiectasis, fibromyalgias, migraine headaches, anxiety/depression, palpitations, no coronary artery calcification by cardiac CTA 01/2021.  She was last seen 12/29/2020 by Dr. Saunders Revel.  At most recent clinic visit she noted intermittent chest tightness and palpitations.  The symptoms were not exertional but occurring daily.  They improved with as needed diltiazem which has been prescribed at previous visit. She noted occasional lightheadedness and syncopal episode several months prior for which neurologic workup was unremarkable.  She was recommended to stop amlodipine and start diltiazem 180 mg daily. She was recommended for cardiac CTA which showed coronary calcium  score of 0 and no significant extracardiac abnormalities.  She presents today for follow-up with her husband.  She reports feeling overall well.  We reviewed cardiac CTA in detail and she was reassured by the result. Checks heart rate at home with Apple Watch - heart rate 62-67 bpm.  Reports home blood pressure is well controlled.  Denies recurrent chest pain, pressure, tightness.  No lightheadedness, dizziness, near-syncope, syncope.  She reports very rare palpitations which is overall markedly improved  since the addition of diltiazem CD1 80 mg daily which she is taking in the evening.  She does also take short acting diltiazem daily in the morning we discussed utilizing this on a as needed basis.  Discussed triggers for palpitations including caffeine, dehydration, stress.  She and her husband both endorse that life has been more stressful recently.  Reports no shortness of breath at rest, dyspnea on exertion.  No edema, orthopnea, PND.  EKGs/Labs/Other Studies Reviewed:   The following studies were reviewed today: Cardiac CTA 01/11/2021  IMPRESSION: No acute or significant extracardiac abnormality.    IMPRESSION: 1. Normal coronary calcium score of 0. Patient is low risk for coronary events.   2. Normal coronary origin with right dominance.   3. No evidence of CAD.   4. CAD-RADS 0. Consider non-atherosclerotic causes of chest pain.   EKG:  EKG is ordered today.  The ekg ordered today demonstrates sinus rhythm 62 bpm stable inferolateral ST/T wave changes.  Stable compared to previous.  Recent Labs: 06/06/2020: Hemoglobin 14.9; Platelets 329 06/15/2020: BUN 8; Potassium 4.5; Sodium 140 01/11/2021: Creatinine, Ser 0.80  Recent Lipid Panel No results found for: CHOL, TRIG, HDL, CHOLHDL, VLDL, LDLCALC, LDLDIRECT   Home Medications   Current Meds  Medication Sig  . albuterol (PROVENTIL HFA;VENTOLIN HFA) 108 (90 BASE) MCG/ACT inhaler Inhale 2 puffs into the lungs every 6 (six) hours as needed for wheezing or shortness of breath.  . cetirizine (ZYRTEC) 10 MG tablet Take 10 mg by mouth daily.  . clonazePAM (KLONOPIN) 0.5 MG tablet Take 0.5 mg by mouth 2 (two) times daily as needed.  . cyclobenzaprine (FLEXERIL) 10 MG tablet Take 10 mg by mouth at bedtime.  [name] diltiazem (CARDIZEM CD) 180 MG 24 hr capsule Take 1 capsule (180 mg total) by mouth daily.  [name] diltiazem (CARDIZEM) 30 MG tablet TAKE ONE TABLET BY MOUTH DAILY AS NEEDED FOR PALPIATIONS  . EPINEPHrine 0.3 mg/0.3 mL IJ SOAJ injection  Inject 0.3 mg into the muscle as needed for anaphylaxis.  [name] gabapentin (NEURONTIN) 100 MG capsule Take by mouth.  . gabapentin (NEURONTIN) 400 MG capsule Take 400 mg by mouth 3 (three) times daily.  . hydrOXYzine (VISTARIL) 25 MG capsule Take 25 mg by mouth 3 (three) times daily.  [name] lithium carbonate 300 MG capsule Take 600 mg by mouth at bedtime.  . metaxalone (SKELAXIN) 800 MG tablet Take by mouth.  . mirtazapine (REMERON) 30 MG tablet Take 30 mg by mouth at bedtime.  . nitroGLYCERIN (NITROSTAT) 0.4 MG SL tablet PLACE ONE TABLET UNDER THE TONGUE EVERY 5 MINUTES AS NEEDED FOR CHEST PAIN. MAXIMUM OF 3 DOSES.  [name] omeprazole (PRILOSEC) 40 MG capsule Take 40 mg by mouth 2 (two) times daily.   . ondansetron (ZOFRAN) 4 MG tablet Take 1 tablet (4 mg total) by mouth every 8 (eight) hours as needed for nausea or vomiting. Further refills to come from primary care provider.  . rosuvastatin (CRESTOR) 20 MG tablet Take 20 mg by mouth at bedtime.  [name]  tiZANidine (ZANAFLEX) 4 MG tablet Take 4 mg by mouth at bedtime.  . topiramate (TOPAMAX) 50 MG tablet Taking 100 mg at night  . venlafaxine XR (EFFEXOR-XR) 150 MG 24 hr capsule Take 150 mg by mouth daily.  . Vitamin D, Ergocalciferol, (DRISDOL) 1.25 MG (50000 UNIT) CAPS capsule Take 1 capsule by mouth once a week.  . zolpidem (AMBIEN) 10 MG tablet Take 10 mg by mouth at bedtime.     Review of Systems    All other systems reviewed and are otherwise negative except as noted above.  Physical Exam    VS:  BP 128/80 (BP Location: Left Arm, Patient Position: Sitting, Cuff Size: Normal)   Pulse 62   Ht 5\' 2"  (1.575 m)   Wt 156 lb (70.8 kg)   LMP 09/24/2016 (Approximate)   SpO2 98%   BMI 28.53 kg/m  , BMI Body mass index is 28.53 kg/m.  Wt Readings from Last 3 Encounters:  03/02/21 156 lb (70.8 kg)  12/29/20 160 lb 6 oz (72.7 kg)  06/15/20 182 lb 3.2 oz (82.6 kg)    GEN: Well nourished, well developed, in no acute distress. HEENT: normal. Neck:  Supple, no JVD, carotid bruits, or masses. Cardiac: RRR, no murmurs, rubs, or gallops. No clubbing, cyanosis, edema.  Radials/PT 2+ and equal bilaterally.  Respiratory:  Respirations regular and unlabored, clear to auscultation bilaterally. GI: Soft, nontender, nondistended. MS: No deformity or atrophy. Skin: Warm and dry, no rash. Neuro:  Strength and sensation are intact. Psych: Normal affect.  Assessment & Plan    1. Chest pain and palpitations- cardiac CTA 01/11/2021 with coronary calcium score of 0 and no significant extracardiac findings.  Chest pain noncardiac and has not recurred, no indication for further work-up at this time.  Palpitations well controlled on diltiazem CD 180 mg daily.  Previous EF 05/2020 with predominantly normal sinus rhythm and rare PAC/PVC.  No indication for repeat monitoring at this time.  She will continue to use diltiazem 30 mg as needed for breakthrough palpitations.  Discussed staying well-hydrated, avoiding caffeine, managing stress well. Heart healthy diet and regular cardiovascular exercise encouraged.   2. Hypertension- BP well controlled. Continue current antihypertensive regimen.   3. Syncope- No recurrence. Previous neurologic work-up unremarkable.  No indication for further evaluation at this time.  Disposition: Follow up in 4-6 month(s) with Dr. Saunders Revel  Signed, Loel Dubonnet, NP 03/02/2021, 11:04 AM [place]

## 2021-03-02 NOTE — Patient Instructions (Addendum)
Medication Instructions:  Continue your current medications.   *If you need a refill on your cardiac medications before your next appointment, please call your pharmacy*  Lab Work: None ordered today.  Testing/Procedures: Your cardiac CTA showed no plaque which is a great result!  Follow-Up: At [facility], you and your health needs are our priority.  As part of our continuing mission to provide you with exceptional heart care, we have created designated Provider Care Teams.  These Care Teams include your primary Cardiologist (physician) and Advanced Practice Providers (APPs -  Physician Assistants and Nurse Practitioners) who all work together to provide you with the care you need, when you need it.  We recommend signing up for the patient portal called "MyChart".  Sign up information is provided on this After Visit Summary.  MyChart is used to connect with patients for Virtual Visits (Telemedicine).  Patients are able to view lab/test results, encounter notes, upcoming appointments, etc.  Non-urgent messages can be sent to your provider as well.   To learn more about what you can do with MyChart, go to [email or web address].    Your next appointment:   4-6  month(s)  The format for your next appointment:   In Person  Provider:   You may see Nelva Bush, MD or one of the following Advanced Practice Providers on your designated Care Team:    Murray Hodgkins, NP  Christell Faith, PA-C  Marrianne Mood, PA-C  Cadence Kathlen Mody, [place]  Laurann Montana, NP  Other Instructions  Heart Healthy Diet Recommendations: A low-salt diet is recommended. Meats should be grilled, baked, or boiled. Avoid fried foods. Focus on lean protein sources like fish or chicken with vegetables and fruits. The American Heart Association is a Microbiologist!  American Heart Association Diet and Lifeystyle Recommendations   Exercise recommendations: The American Heart Association recommends 150 minutes of  moderate intensity exercise weekly. Try 30 minutes of moderate intensity exercise 4-5 times per week. This could include walking, jogging, or swimming.  To prevent palpitations: Make sure you are adequately hydrated.  Avoid and/or limit caffeine containing beverages like soda or tea. Exercise regularly.  Manage stress well. Some over the counter medications can cause palpitations such as Benadryl, AdvilPM, TylenolPM. Regular Advil or Tylenol do not cause palpitations.

## 2021-03-05 ENCOUNTER — Other Ambulatory Visit: Payer: Self-pay | Admitting: Family Medicine

## 2021-03-05 DIAGNOSIS — Z1231 Encounter for screening mammogram for malignant neoplasm of breast: Secondary | ICD-10-CM

## 2021-03-14 ENCOUNTER — Other Ambulatory Visit: Payer: Self-pay

## 2021-03-14 ENCOUNTER — Ambulatory Visit
Admission: RE | Admit: 2021-03-14 | Discharge: 2021-03-14 | Disposition: A | Discharge status: Home / Self Care | Payer: PPO | Source: Ambulatory Visit | Attending: Family Medicine | Admitting: Family Medicine

## 2021-03-14 DIAGNOSIS — Z1231 Encounter for screening mammogram for malignant neoplasm of breast: Secondary | ICD-10-CM | POA: Diagnosis not present

## 2021-03-16 DIAGNOSIS — R1084 Generalized abdominal pain: Secondary | ICD-10-CM | POA: Diagnosis not present

## 2021-03-16 DIAGNOSIS — K5730 Diverticulosis of large intestine without perforation or abscess without bleeding: Secondary | ICD-10-CM | POA: Diagnosis not present

## 2021-03-16 DIAGNOSIS — Z980 Intestinal bypass and anastomosis status: Secondary | ICD-10-CM | POA: Diagnosis not present

## 2021-03-16 DIAGNOSIS — K625 Hemorrhage of anus and rectum: Secondary | ICD-10-CM | POA: Diagnosis not present

## 2021-03-16 DIAGNOSIS — D123 Benign neoplasm of transverse colon: Secondary | ICD-10-CM | POA: Diagnosis not present

## 2021-03-16 DIAGNOSIS — K3189 Other diseases of stomach and duodenum: Secondary | ICD-10-CM | POA: Diagnosis not present

## 2021-03-16 DIAGNOSIS — R634 Abnormal weight loss: Secondary | ICD-10-CM | POA: Diagnosis not present

## 2021-03-16 DIAGNOSIS — K621 Rectal polyp: Secondary | ICD-10-CM | POA: Diagnosis not present

## 2021-03-16 DIAGNOSIS — F32A Depression, unspecified: Secondary | ICD-10-CM | POA: Diagnosis not present

## 2021-03-16 DIAGNOSIS — K2930 Chronic superficial gastritis without bleeding: Secondary | ICD-10-CM | POA: Diagnosis not present

## 2021-03-16 DIAGNOSIS — I10 Essential (primary) hypertension: Secondary | ICD-10-CM | POA: Diagnosis not present

## 2021-03-16 DIAGNOSIS — G894 Chronic pain syndrome: Secondary | ICD-10-CM | POA: Diagnosis not present

## 2021-03-16 DIAGNOSIS — R1030 Lower abdominal pain, unspecified: Secondary | ICD-10-CM | POA: Diagnosis not present

## 2021-03-16 DIAGNOSIS — K635 Polyp of colon: Secondary | ICD-10-CM | POA: Diagnosis not present

## 2021-03-16 DIAGNOSIS — I69828 Other speech and language deficits following other cerebrovascular disease: Secondary | ICD-10-CM | POA: Diagnosis not present

## 2021-03-16 DIAGNOSIS — K7460 Unspecified cirrhosis of liver: Secondary | ICD-10-CM | POA: Diagnosis not present

## 2021-03-16 DIAGNOSIS — J449 Chronic obstructive pulmonary disease, unspecified: Secondary | ICD-10-CM | POA: Diagnosis not present

## 2021-03-16 DIAGNOSIS — M797 Fibromyalgia: Secondary | ICD-10-CM | POA: Diagnosis not present

## 2021-03-16 DIAGNOSIS — D369 Benign neoplasm, unspecified site: Secondary | ICD-10-CM | POA: Diagnosis not present

## 2021-03-16 DIAGNOSIS — Z882 Allergy status to sulfonamides status: Secondary | ICD-10-CM | POA: Diagnosis not present

## 2021-04-23 DIAGNOSIS — R413 Other amnesia: Secondary | ICD-10-CM | POA: Diagnosis not present

## 2021-04-23 DIAGNOSIS — R519 Headache, unspecified: Secondary | ICD-10-CM | POA: Diagnosis not present

## 2021-04-23 DIAGNOSIS — R42 Dizziness and giddiness: Secondary | ICD-10-CM | POA: Diagnosis not present

## 2021-04-23 DIAGNOSIS — R55 Syncope and collapse: Secondary | ICD-10-CM | POA: Diagnosis not present

## 2021-05-10 DIAGNOSIS — R109 Unspecified abdominal pain: Secondary | ICD-10-CM | POA: Diagnosis not present

## 2021-05-10 DIAGNOSIS — Z1389 Encounter for screening for other disorder: Secondary | ICD-10-CM | POA: Diagnosis not present

## 2021-05-10 DIAGNOSIS — R634 Abnormal weight loss: Secondary | ICD-10-CM | POA: Diagnosis not present

## 2021-05-10 DIAGNOSIS — K760 Fatty (change of) liver, not elsewhere classified: Secondary | ICD-10-CM | POA: Diagnosis not present

## 2021-05-10 DIAGNOSIS — F331 Major depressive disorder, recurrent, moderate: Secondary | ICD-10-CM | POA: Diagnosis not present

## 2021-05-10 DIAGNOSIS — R197 Diarrhea, unspecified: Secondary | ICD-10-CM | POA: Diagnosis not present

## 2021-05-10 DIAGNOSIS — C181 Malignant neoplasm of appendix: Secondary | ICD-10-CM | POA: Diagnosis not present

## 2021-05-10 DIAGNOSIS — F4312 Post-traumatic stress disorder, chronic: Secondary | ICD-10-CM | POA: Diagnosis not present

## 2021-05-21 ENCOUNTER — Encounter: Payer: Self-pay | Admitting: Emergency Medicine

## 2021-05-21 ENCOUNTER — Emergency Department: Payer: PPO

## 2021-05-21 ENCOUNTER — Emergency Department
Admission: EM | Admit: 2021-05-21 | Discharge: 2021-05-21 | Disposition: A | Discharge status: Home / Self Care | Payer: PPO | Attending: Emergency Medicine | Admitting: Emergency Medicine

## 2021-05-21 ENCOUNTER — Other Ambulatory Visit: Payer: Self-pay

## 2021-05-21 DIAGNOSIS — I5030 Unspecified diastolic (congestive) heart failure: Secondary | ICD-10-CM | POA: Insufficient documentation

## 2021-05-21 DIAGNOSIS — K760 Fatty (change of) liver, not elsewhere classified: Secondary | ICD-10-CM | POA: Insufficient documentation

## 2021-05-21 DIAGNOSIS — R1011 Right upper quadrant pain: Secondary | ICD-10-CM | POA: Diagnosis not present

## 2021-05-21 DIAGNOSIS — R110 Nausea: Secondary | ICD-10-CM | POA: Diagnosis not present

## 2021-05-21 DIAGNOSIS — K219 Gastro-esophageal reflux disease without esophagitis: Secondary | ICD-10-CM | POA: Diagnosis not present

## 2021-05-21 DIAGNOSIS — I110 Hypertensive heart disease with heart failure: Secondary | ICD-10-CM | POA: Insufficient documentation

## 2021-05-21 DIAGNOSIS — R109 Unspecified abdominal pain: Secondary | ICD-10-CM | POA: Diagnosis not present

## 2021-05-21 DIAGNOSIS — I10 Essential (primary) hypertension: Secondary | ICD-10-CM | POA: Diagnosis not present

## 2021-05-21 LAB — COMPREHENSIVE METABOLIC PANEL
ALT: 22 U/L (ref 0–44)
AST: 26 U/L (ref 15–41)
Albumin: 4.7 g/dL (ref 3.5–5.0)
Alkaline Phosphatase: 139 U/L — ABNORMAL HIGH (ref 38–126)
Anion gap: 7 (ref 5–15)
BUN: 10 mg/dL (ref 6–20)
CO2: 25 mmol/L (ref 22–32)
Calcium: 10.2 mg/dL (ref 8.9–10.3)
Chloride: 106 mmol/L (ref 98–111)
Creatinine, Ser: 0.87 mg/dL (ref 0.44–1.00)
GFR, Estimated: >60 mL/min (ref >60)
Glucose, Bld: 94 mg/dL (ref 70–99)
Potassium: 3.2 mmol/L — ABNORMAL LOW (ref 3.5–5.1)
Sodium: 138 mmol/L (ref 135–145)
Total Bilirubin: 1.1 mg/dL (ref 0.3–1.2)
Total Protein: 8.4 g/dL — ABNORMAL HIGH (ref 6.5–8.1)

## 2021-05-21 LAB — CBC
HCT: 40.9 % (ref 36.0–46.0)
Hemoglobin: 14.2 g/dL (ref 12.0–15.0)
MCH: 32.1 pg (ref 26.0–34.0)
MCHC: 34.7 g/dL (ref 30.0–36.0)
MCV: 92.5 fL (ref 80.0–100.0)
Platelets: 229 10*3/uL (ref 150–400)
RBC: 4.42 MIL/uL (ref 3.87–5.11)
RDW: 12.4 % (ref 11.5–15.5)
WBC: 9.4 10*3/uL (ref 4.0–10.5)
nRBC: 0 % (ref 0.0–0.2)

## 2021-05-21 LAB — URINALYSIS, COMPLETE (UACMP) WITH MICROSCOPIC
Bilirubin Urine: NEGATIVE
Glucose, UA: NEGATIVE mg/dL
Hgb urine dipstick: NEGATIVE
Ketones, ur: NEGATIVE mg/dL
Nitrite: NEGATIVE
Protein, ur: NEGATIVE mg/dL
Specific Gravity, Urine: 1.008 (ref 1.005–1.030)
pH: 6 (ref 5.0–8.0)

## 2021-05-21 LAB — LIPASE, BLOOD: Lipase: 28 U/L (ref 11–51)

## 2021-05-21 MED ORDER — ONDANSETRON 4 MG PO TBDP: 4.0000 mg | ORAL_TABLET | Freq: Three times a day (TID) | ORAL | 0 refills | Status: DC | PRN

## 2021-05-21 MED ORDER — ONDANSETRON HCL 4 MG/2ML IJ SOLN
4.0000 mg | Freq: Once | INTRAMUSCULAR | Status: DC
  Filled 2021-05-21: qty 2

## 2021-05-21 MED ORDER — IOHEXOL 350 MG/ML SOLN
100.0000 mL | Freq: Once | INTRAVENOUS | Status: AC | PRN
  Administered 2021-05-21: 100 mL via INTRAVENOUS

## 2021-05-21 MED ORDER — MORPHINE SULFATE (PF) 4 MG/ML IV SOLN
4.0000 mg | Freq: Once | INTRAVENOUS | Status: DC
  Filled 2021-05-21: qty 1

## 2021-05-21 MED ORDER — POTASSIUM CHLORIDE CRYS ER 20 MEQ PO TBCR
40.0000 meq | EXTENDED_RELEASE_TABLET | Freq: Once | ORAL | Status: AC
  Administered 2021-05-21: 40 meq via ORAL
  Filled 2021-05-21: qty 2

## 2021-05-21 NOTE — ED Notes (Signed)
See triage note, pt reports upper abd pain for a few days. +nausea.  Hx gallbladder and appendix removed.  Pt in NAD

## 2021-05-21 NOTE — ED Triage Notes (Signed)
Right sided/ upper abdominal pain x 3 days.  Also nausea.

## 2021-05-21 NOTE — ED Provider Notes (Signed)
[facility] Emergency Department Provider Note   ____________________________________________   Event Date/Time   First MD Initiated Contact with Patient 05/21/21 1835     (approximate)  I have reviewed the triage vital signs and the nursing notes.   HISTORY  Chief Complaint Abdominal Pain    HPI Melinda Shaw is a 55 y.o. female with past medical history of hypertension, hyperlipidemia, bronchiectasis, and fibromyalgia who presents to the ED complaining of abdominal pain.  Patient reports that she has had approximately 3 days of gradually worsening pain in the right upper quadrant of her abdomen.  She describes the pain as sharp and constant, worse when she drinks some soda a couple of days ago and also worse with certain positions.  It radiates towards her right flank and is associated with nausea, but she has not vomited and denies any changes in her bowel movements.  She denies any dysuria, hematuria, vaginal bleeding, or vaginal discharge.  She has never had similar symptoms in the past, states she is status post appendectomy and cholecystectomy.        Past Medical History:  Diagnosis Date   Anemia    H/O   Anxiety    Bronchitis    Chest pain    a. 09/2018 Cath: Nl cors. EF 65%->Med rx (long-acting nitrate added).   Depression    Diastolic dysfunction    a. 10/2018 Echo: EF 55-60%, no rwma, Gr1DD. Mild MR. Nl RV fxn.   DOE (dyspnea on exertion)    Fibromyalgia    GERD (gastroesophageal reflux disease)    Headache    Hyperlipidemia    Hypertension    no meds   Palpitations    a. 01/2019 [facility]): NSR w/ rare PACs & PVCs.   PONV (postoperative nausea and vomiting)     Patient Active Problem List   Diagnosis Date Noted   Syncope 12/29/2020   Palpitations 12/04/2019   Essential hypertension 12/04/2019   Chest pain 12/04/2019   Unstable angina ([place]) 09/16/2018   Adult bronchiectasis ([place]) 08/16/2015   SOB (shortness of breath)  02/22/2015   Cough 02/22/2015    Past Surgical History:  Procedure Laterality Date   ANTERIOR CRUCIATE LIGAMENT REPAIR     CARDIAC CATHETERIZATION     CHOLECYSTECTOMY     CORONARY ANGIOPLASTY     ESOPHAGOGASTRODUODENOSCOPY (EGD) WITH PROPOFOL N/A 08/28/2015   Procedure: ESOPHAGOGASTRODUODENOSCOPY (EGD) WITH PROPOFOL;  Surgeon: Josefine Class, MD;  Location: [facility] ENDOSCOPY;  Service: Endoscopy;  Laterality: N/A;   KNEE SURGERY     left knee    LEFT HEART CATH AND CORONARY ANGIOGRAPHY Left 09/22/2018   Procedure: LEFT HEART CATH AND CORONARY ANGIOGRAPHY;  Surgeon: Nelva Bush, MD;  Location: [place] CV LAB;  Service: Cardiovascular;  Laterality: Left;   MASS EXCISION Left 01/26/2016   Procedure: EXCISION OF LEFT WRIST VOLAR MASS;  Surgeon: Charlotte Crumb, MD;  Location: [place];  Service: Orthopedics;  Laterality: Left;   NOSE SURGERY     POLYPECTOMY     TUBAL LIGATION     VIDEO BRONCHOSCOPY N/A 05/29/2015   Procedure: VIDEO BRONCHOSCOPY WITHOUT FLUORO;  Surgeon: Vilinda Boehringer, MD;  Location: [facility] ORS;  Service: Cardiopulmonary;  Laterality: N/A;    Prior to Admission medications   Medication Sig Start Date End Date Taking? Authorizing Provider  ondansetron (ZOFRAN ODT) 4 MG disintegrating tablet Take 1 tablet (4 mg total) by mouth every 8 (eight) hours as needed for nausea or vomiting.  05/21/21  Yes Blake Divine, MD  albuterol (PROVENTIL HFA;VENTOLIN HFA) 108 (90 BASE) MCG/ACT inhaler Inhale 2 puffs into the lungs every 6 (six) hours as needed for wheezing or shortness of breath.    [provider]  cetirizine (ZYRTEC) 10 MG tablet Take 10 mg by mouth daily. 01/20/21   [provider]  clonazePAM (KLONOPIN) 0.5 MG tablet Take 0.5 mg by mouth 2 (two) times daily as needed. 11/13/19   [provider]  cyclobenzaprine (FLEXERIL) 10 MG tablet Take 10 mg by mouth at bedtime.    [provider]  diltiazem (CARDIZEM CD) 180  MG 24 hr capsule Take 1 capsule (180 mg total) by mouth daily. 12/29/20 03/29/21  End, Harrell Gave, MD  diltiazem (CARDIZEM) 30 MG tablet Take 1 tablet (30 mg total) by mouth as needed (Once daily as needed for palpitations). 03/02/21   Loel Dubonnet, NP  EPINEPHrine 0.3 mg/0.3 mL IJ SOAJ injection Inject 0.3 mg into the muscle as needed for anaphylaxis.    [provider]  gabapentin (NEURONTIN) 100 MG capsule Take by mouth. 12/09/20   [provider]  gabapentin (NEURONTIN) 400 MG capsule Take 400 mg by mouth 3 (three) times daily. 02/05/20   [provider]  hydrOXYzine (VISTARIL) 25 MG capsule Take 25 mg by mouth 3 (three) times daily.    [provider]  lithium carbonate 300 MG capsule Take 600 mg by mouth at bedtime.    [provider]  metaxalone (SKELAXIN) 800 MG tablet Take by mouth. 09/08/20   [provider]  mirtazapine (REMERON) 30 MG tablet Take 30 mg by mouth at bedtime.    [provider]  nitroGLYCERIN (NITROSTAT) 0.4 MG SL tablet PLACE ONE TABLET UNDER THE TONGUE EVERY 5 MINUTES AS NEEDED FOR CHEST PAIN. MAXIMUM OF 3 DOSES. 09/01/20   End, Harrell Gave, MD  omeprazole (PRILOSEC) 40 MG capsule Take 40 mg by mouth 2 (two) times daily.     [provider]  ondansetron (ZOFRAN) 4 MG tablet Take 1 tablet (4 mg total) by mouth every 8 (eight) hours as needed for nausea or vomiting. Further refills to come from primary care provider. 06/15/20   Loel Dubonnet, NP  rosuvastatin (CRESTOR) 20 MG tablet Take 20 mg by mouth at bedtime. 02/27/21   [provider]  tiZANidine (ZANAFLEX) 4 MG tablet Take 4 mg by mouth at bedtime.    [provider]  topiramate (TOPAMAX) 50 MG tablet Taking 100 mg at night 09/12/20   [provider]  venlafaxine XR (EFFEXOR-XR) 150 MG 24 hr capsule Take 150 mg by mouth daily.    [provider]  Vitamin D, Ergocalciferol, (DRISDOL) 1.25 MG (50000 UNIT) CAPS  capsule Take 1 capsule by mouth once a week. 12/12/20   [provider]  zolpidem (AMBIEN) 10 MG tablet Take 10 mg by mouth at bedtime.    [provider]    Allergies Cherry, Shellfish allergy, Sulfa antibiotics, Bee venom, and Imdur [isosorbide nitrate]  Family History  Problem Relation Age of Onset   COPD Mother    High Cholesterol Mother    Heart failure Mother    Hypertension Mother    Hypertension Father    AAA (abdominal aortic aneurysm) Sister    COPD Sister    High Cholesterol Sister     Social History Social History   Tobacco Use   Smoking status: Never   Smokeless tobacco: Never  Vaping Use   Vaping Use: Never  used  Substance Use Topics   Alcohol use: No    Alcohol/week: 0.0 standard drinks   Drug use: No    Review of Systems  Constitutional: No fever/chills Eyes: No visual changes. ENT: No sore throat. Cardiovascular: Denies chest pain. Respiratory: Denies shortness of breath. Gastrointestinal: Positive for abdominal pain and nausea, no vomiting.  No diarrhea.  No constipation. Genitourinary: Negative for dysuria. Musculoskeletal: Negative for back pain. Skin: Negative for rash. Neurological: Negative for headaches, focal weakness or numbness.  ____________________________________________   PHYSICAL EXAM:  VITAL SIGNS: ED Triage Vitals  Enc Vitals Group     BP 05/21/21 1534 138/83     Pulse Rate 05/21/21 1534 68     Resp 05/21/21 1534 14     Temp 05/21/21 1534 98.6 F (37 C)     Temp Source 05/21/21 1534 Oral     SpO2 05/21/21 1534 99 %     Weight 05/21/21 1529 156 lb 1.4 oz (70.8 kg)     Height 05/21/21 1529 5\' 2"  (1.575 m)     Head Circumference --      Peak Flow --      Pain Score 05/21/21 1529 10     Pain Loc --      Pain Edu? --      Excl. in [place]? --     Constitutional: Alert and oriented. Eyes: Conjunctivae are normal. Head: Atraumatic. Nose: No congestion/rhinnorhea. Mouth/Throat: Mucous membranes are  moist. Neck: Normal ROM Cardiovascular: Normal rate, regular rhythm. Grossly normal heart sounds. Respiratory: Normal respiratory effort.  No retractions. Lungs CTAB. Gastrointestinal: Soft and tender to palpation in the right upper quadrant with no rebound or guarding.  No CVA tenderness bilaterally. No distention. Genitourinary: deferred Musculoskeletal: No lower extremity tenderness nor edema. Neurologic:  Normal speech and language. No gross focal neurologic deficits are appreciated. Skin:  Skin is warm, dry and intact. No rash noted. Psychiatric: Mood and affect are normal. Speech and behavior are normal.  ____________________________________________   LABS (all labs ordered are listed, but only abnormal results are displayed)  Labs Reviewed  COMPREHENSIVE METABOLIC PANEL - Abnormal; Notable for the following components:      Result Value   Potassium 3.2 (*)    Total Protein 8.4 (*)    Alkaline Phosphatase 139 (*)    All other components within normal limits  URINALYSIS, COMPLETE (UACMP) WITH MICROSCOPIC - Abnormal; Notable for the following components:   Color, Urine YELLOW (*)    APPearance HAZY (*)    Leukocytes,Ua TRACE (*)    Bacteria, UA MANY (*)    All other components within normal limits  LIPASE, BLOOD  CBC   ____________________________________________  EKG  ED ECG REPORT I, Blake Divine, the attending physician, personally viewed and interpreted this ECG.   Date: 05/21/2021  EKG Time: 15:36  Rate: 71  Rhythm: normal sinus rhythm  Axis: Normal  Intervals:none  ST&T Change: Inferolateral T wave changes   PROCEDURES  Procedure(s) performed (including Critical Care):  Procedures   ____________________________________________   INITIAL IMPRESSION / ASSESSMENT AND PLAN / ED COURSE      55 year old female with past medical history of hypertension, hyperlipidemia, fibromyalgia, and bronchiectasis who presents to the ED with 3 days of worsening  pain in the right upper quadrant of her abdomen with radiation towards her right flank.  Pain is reproduced with palpation of her right upper quadrant, however she is status postcholecystectomy.  We will further assess with CT scan, labs thus far  are unremarkable and UA does not appear consistent with infection.  We will treat symptomatically with IV morphine and Zofran.  EKG shows no evidence of arrhythmia or ischemia and I doubt ACS as the source of her symptoms.  CT scan shows fatty infiltration versus cirrhosis of liver, which could explain patient's pain.  LFTs are within normal limits and patient with minimal pain at this time, has declined pain medication.  She is appropriate for discharge home with GI and PCP follow-up for nonemergent MRI.  She was counseled to return to the ED for new or worsening symptoms, patient agrees with plan.      ____________________________________________   FINAL CLINICAL IMPRESSION(S) / ED DIAGNOSES  Final diagnoses:  RUQ abdominal pain  Fatty liver     ED Discharge Orders          Ordered    ondansetron (ZOFRAN ODT) 4 MG disintegrating tablet  Every 8 hours PRN        05/21/21 2159             Note:  This document was prepared using Dragon voice recognition software and may include unintentional dictation errors.    Blake Divine, MD 05/21/21 2202

## 2021-06-13 DIAGNOSIS — J449 Chronic obstructive pulmonary disease, unspecified: Secondary | ICD-10-CM | POA: Diagnosis not present

## 2021-06-13 DIAGNOSIS — E785 Hyperlipidemia, unspecified: Secondary | ICD-10-CM | POA: Diagnosis not present

## 2021-06-25 DIAGNOSIS — R42 Dizziness and giddiness: Secondary | ICD-10-CM | POA: Diagnosis not present

## 2021-06-25 DIAGNOSIS — R413 Other amnesia: Secondary | ICD-10-CM | POA: Diagnosis not present

## 2021-06-25 DIAGNOSIS — R519 Headache, unspecified: Secondary | ICD-10-CM | POA: Diagnosis not present

## 2021-06-25 DIAGNOSIS — R55 Syncope and collapse: Secondary | ICD-10-CM | POA: Diagnosis not present

## 2021-06-27 ENCOUNTER — Telehealth: Payer: Self-pay | Admitting: *Deleted

## 2021-06-27 NOTE — Telephone Encounter (Signed)
Received a letter from [facility] of incidental findings on CT abdomen done thru [facility] on 05/21/21. Requesting results of scan.  Advised she call her pcp who is at [facility]. Patient has upcoming appointment with pcp on 07/01/21. Stated she would contact primary care's office for information.

## 2021-06-28 ENCOUNTER — Other Ambulatory Visit: Payer: Self-pay | Admitting: Internal Medicine

## 2021-08-09 ENCOUNTER — Ambulatory Visit
Admission: EM | Admit: 2021-08-09 | Discharge: 2021-08-09 | Disposition: A | Discharge status: Home / Self Care | Payer: PPO | Attending: Emergency Medicine | Admitting: Emergency Medicine

## 2021-08-09 ENCOUNTER — Encounter: Payer: Self-pay | Admitting: Emergency Medicine

## 2021-08-09 ENCOUNTER — Other Ambulatory Visit: Payer: Self-pay

## 2021-08-09 DIAGNOSIS — S40862A Insect bite (nonvenomous) of left upper arm, initial encounter: Secondary | ICD-10-CM

## 2021-08-09 MED ORDER — CEPHALEXIN 500 MG PO CAPS: 500.0000 mg | ORAL_CAPSULE | Freq: Four times a day (QID) | ORAL | 0 refills | Status: DC

## 2021-08-09 NOTE — ED Triage Notes (Signed)
Insect bite to top of LT wrist yesterday.

## 2021-08-09 NOTE — ED Provider Notes (Signed)
[facility]    CSN: [id number] Arrival date & time: 08/09/21  1737      History   Chief Complaint No chief complaint on file.   HPI Melinda Shaw is a 55 y.o. female.   Pt.. c/o insect bite to left wrist yesterday. She states that she was mowing and something bit her. She thinks it may be a spider bite. Pt. Has redness and swelling of left forearm.    Past Medical History:  Diagnosis Date   Anemia    H/O   Anxiety    Bronchitis    Chest pain    a. 09/2018 Cath: Nl cors. EF 65%->Med rx (long-acting nitrate added).   Depression    Diastolic dysfunction    a. 10/2018 Echo: EF 55-60%, no rwma, Gr1DD. Mild MR. Nl RV fxn.   DOE (dyspnea on exertion)    Fibromyalgia    GERD (gastroesophageal reflux disease)    Headache    Hyperlipidemia    Hypertension    no meds   Palpitations    a. 01/2019 [facility]): NSR w/ rare PACs & PVCs.   PONV (postoperative nausea and vomiting)     Patient Active Problem List   Diagnosis Date Noted   Syncope 12/29/2020   Palpitations 12/04/2019   Essential hypertension 12/04/2019   Chest pain 12/04/2019   Unstable angina ([place]) 09/16/2018   Adult bronchiectasis ([place]) 08/16/2015   SOB (shortness of breath) 02/22/2015   Cough 02/22/2015    Past Surgical History:  Procedure Laterality Date   ANTERIOR CRUCIATE LIGAMENT REPAIR     CARDIAC CATHETERIZATION     CHOLECYSTECTOMY     CORONARY ANGIOPLASTY     ESOPHAGOGASTRODUODENOSCOPY (EGD) WITH PROPOFOL N/A 08/28/2015   Procedure: ESOPHAGOGASTRODUODENOSCOPY (EGD) WITH PROPOFOL;  Surgeon: Josefine Class, MD;  Location: [facility] ENDOSCOPY;  Service: Endoscopy;  Laterality: N/A;   KNEE SURGERY     left knee    LEFT HEART CATH AND CORONARY ANGIOGRAPHY Left 09/22/2018   Procedure: LEFT HEART CATH AND CORONARY ANGIOGRAPHY;  Surgeon: Nelva Bush, MD;  Location: [place] CV LAB;  Service: Cardiovascular;  Laterality: Left;   MASS EXCISION Left 01/26/2016   Procedure: EXCISION  OF LEFT WRIST VOLAR MASS;  Surgeon: Charlotte Crumb, MD;  Location: [place];  Service: Orthopedics;  Laterality: Left;   NOSE SURGERY     POLYPECTOMY     TUBAL LIGATION     VIDEO BRONCHOSCOPY N/A 05/29/2015   Procedure: VIDEO BRONCHOSCOPY WITHOUT FLUORO;  Surgeon: Vilinda Boehringer, MD;  Location: [facility] ORS;  Service: Cardiopulmonary;  Laterality: N/A;    OB History     Gravida  2   Para  2   Term      Preterm      AB      Living  2      SAB      IAB      Ectopic      Multiple      Live Births  2            Home Medications    Prior to Admission medications   Medication Sig Start Date End Date Taking? Authorizing Provider  albuterol (PROVENTIL HFA;VENTOLIN HFA) 108 (90 BASE) MCG/ACT inhaler Inhale 2 puffs into the lungs every 6 (six) hours as needed for wheezing or shortness of breath.    [provider]  cetirizine (ZYRTEC) 10 MG tablet Take 10 mg by mouth daily. 01/20/21   [provider]  clonazePAM (KLONOPIN) 0.5 MG tablet Take 0.5 mg by mouth 2 (two) times daily as needed. 11/13/19   [provider]  cyclobenzaprine (FLEXERIL) 10 MG tablet Take 10 mg by mouth at bedtime.    [provider]  diltiazem (CARDIZEM CD) 180 MG 24 hr capsule TAKE ONE CAPSULE BY MOUTH DAILY 06/29/21   End, Harrell Gave, MD  diltiazem (CARDIZEM) 30 MG tablet Take 1 tablet (30 mg total) by mouth as needed (Once daily as needed for palpitations). 03/02/21   Loel Dubonnet, NP  EPINEPHrine 0.3 mg/0.3 mL IJ SOAJ injection Inject 0.3 mg into the muscle as needed for anaphylaxis.    [provider]  gabapentin (NEURONTIN) 100 MG capsule Take by mouth. 12/09/20   [provider]  gabapentin (NEURONTIN) 400 MG capsule Take 400 mg by mouth 3 (three) times daily. 02/05/20   [provider]  hydrOXYzine (VISTARIL) 25 MG capsule Take 25 mg by mouth 3 (three) times daily.    [provider]  lithium carbonate 300 MG  capsule Take 600 mg by mouth at bedtime.    [provider]  metaxalone (SKELAXIN) 800 MG tablet Take by mouth. 09/08/20   [provider]  mirtazapine (REMERON) 30 MG tablet Take 30 mg by mouth at bedtime.    [provider]  nitroGLYCERIN (NITROSTAT) 0.4 MG SL tablet PLACE ONE TABLET UNDER THE TONGUE EVERY 5 MINUTES AS NEEDED FOR CHEST PAIN. MAXIMUM OF 3 DOSES. 09/01/20   End, Harrell Gave, MD  omeprazole (PRILOSEC) 40 MG capsule Take 40 mg by mouth 2 (two) times daily.     [provider]  ondansetron (ZOFRAN ODT) 4 MG disintegrating tablet Take 1 tablet (4 mg total) by mouth every 8 (eight) hours as needed for nausea or vomiting. 05/21/21   Blake Divine, MD  ondansetron (ZOFRAN) 4 MG tablet Take 1 tablet (4 mg total) by mouth every 8 (eight) hours as needed for nausea or vomiting. Further refills to come from primary care provider. 06/15/20   Loel Dubonnet, NP  rosuvastatin (CRESTOR) 20 MG tablet Take 20 mg by mouth at bedtime. 02/27/21   [provider]  tiZANidine (ZANAFLEX) 4 MG tablet Take 4 mg by mouth at bedtime.    [provider]  topiramate (TOPAMAX) 50 MG tablet Taking 100 mg at night 09/12/20   [provider]  venlafaxine XR (EFFEXOR-XR) 150 MG 24 hr capsule Take 150 mg by mouth daily.    [provider]  Vitamin D, Ergocalciferol, (DRISDOL) 1.25 MG (50000 UNIT) CAPS capsule Take 1 capsule by mouth once a week. 12/12/20   [provider]  zolpidem (AMBIEN) 10 MG tablet Take 10 mg by mouth at bedtime.    [provider]    Family History Family History  Problem Relation Age of Onset   COPD Mother    High Cholesterol Mother    Heart failure Mother    Hypertension Mother    Hypertension Father    AAA (abdominal aortic aneurysm) Sister    COPD Sister    High Cholesterol Sister     Social History Social History   Tobacco Use   Smoking status: Never   Smokeless tobacco: Never  Vaping  Use   Vaping Use: Never used  Substance Use Topics   Alcohol use: No    Alcohol/week: 0.0 standard drinks   Drug use: No     Allergies   Cherry, Shellfish allergy, Sulfa antibiotics, Bee venom, and Imdur [isosorbide  nitrate]   Review of Systems Review of Systems  HENT: Negative.    Respiratory: Negative.    Cardiovascular: Negative.   Gastrointestinal: Negative.   Genitourinary: Negative.   Skin:  Positive for wound.    Physical Exam Triage Vital Signs ED Triage Vitals [08/09/21 1826]  Enc Vitals Group     BP      Pulse      Resp      Temp      Temp src      SpO2      Weight      Height      Head Circumference      Peak Flow      Pain Score 0     Pain Loc      Pain Edu?      Excl. in [place]?    No data found.  Updated Vital Signs LMP 09/24/2016 (Approximate)   Visual Acuity Right Eye Distance:   Left Eye Distance:   Bilateral Distance:    Right Eye Near:   Left Eye Near:    Bilateral Near:     Physical Exam Constitutional:      General: She is not in acute distress.    Appearance: She is not ill-appearing, toxic-appearing or diaphoretic.  Cardiovascular:     Rate and Rhythm: Normal rate.     Heart sounds: No murmur heard.   No friction rub. No gallop.  Pulmonary:     Effort: Pulmonary effort is normal. No respiratory distress.     Breath sounds: Normal breath sounds. No stridor. No wheezing, rhonchi or rales.  Chest:     Chest wall: No tenderness.  Musculoskeletal:     Comments: Left wrist with small apporx 5cm area of erythema no exudate no necrosis no blistering.   Neurological:     Mental Status: She is alert.     UC Treatments / Results  Labs (all labs ordered are listed, but only abnormal results are displayed) Labs Reviewed - No data to display  EKG   Radiology No results found.  Procedures Procedures (including critical care time)  Medications Ordered in UC Medications - No data to display  Initial Impression / Assessment  and Plan / UC Course  I have reviewed the triage vital signs and the nursing notes.  Pertinent labs & imaging results that were available during my care of the patient were reviewed by me and considered in my medical decision making (see chart for details).      Final Clinical Impressions(s) / UC Diagnoses   Final diagnoses:  None   Discharge Instructions   None    ED Prescriptions   None    PDMP not reviewed this encounter.   Lizabeth Leyden [place], [place] 08/09/21 [phone]

## 2021-09-12 DIAGNOSIS — R42 Dizziness and giddiness: Secondary | ICD-10-CM | POA: Diagnosis not present

## 2021-09-12 DIAGNOSIS — Z6826 Body mass index (BMI) 26.0-26.9, adult: Secondary | ICD-10-CM | POA: Diagnosis not present

## 2021-09-12 DIAGNOSIS — M797 Fibromyalgia: Secondary | ICD-10-CM | POA: Diagnosis not present

## 2021-09-12 DIAGNOSIS — E663 Overweight: Secondary | ICD-10-CM | POA: Diagnosis not present

## 2021-09-12 DIAGNOSIS — K219 Gastro-esophageal reflux disease without esophagitis: Secondary | ICD-10-CM | POA: Diagnosis not present

## 2021-09-12 DIAGNOSIS — G4700 Insomnia, unspecified: Secondary | ICD-10-CM | POA: Diagnosis not present

## 2021-09-12 DIAGNOSIS — I5189 Other ill-defined heart diseases: Secondary | ICD-10-CM | POA: Diagnosis not present

## 2021-09-12 DIAGNOSIS — I110 Hypertensive heart disease with heart failure: Secondary | ICD-10-CM | POA: Diagnosis not present

## 2021-09-12 DIAGNOSIS — E785 Hyperlipidemia, unspecified: Secondary | ICD-10-CM | POA: Diagnosis not present

## 2021-09-12 DIAGNOSIS — F32A Depression, unspecified: Secondary | ICD-10-CM | POA: Diagnosis not present

## 2021-09-12 DIAGNOSIS — G894 Chronic pain syndrome: Secondary | ICD-10-CM | POA: Diagnosis not present

## 2021-09-12 DIAGNOSIS — F419 Anxiety disorder, unspecified: Secondary | ICD-10-CM | POA: Diagnosis not present

## 2021-09-23 ENCOUNTER — Other Ambulatory Visit: Payer: Self-pay

## 2021-09-24 NOTE — Telephone Encounter (Signed)
Attempted to schedule.  LMOV to call office.  ° °

## 2021-09-24 NOTE — Telephone Encounter (Signed)
Please schedule overdue 6 month F/U appointment. Thank you! °

## 2021-09-25 MED ORDER — DILTIAZEM HCL ER COATED BEADS 180 MG PO CP24: 180.0000 mg | ORAL_CAPSULE | Freq: Every day | ORAL | 0 refills | Status: DC

## 2021-09-26 ENCOUNTER — Other Ambulatory Visit: Payer: Self-pay | Admitting: Internal Medicine

## 2021-10-10 DIAGNOSIS — E785 Hyperlipidemia, unspecified: Secondary | ICD-10-CM | POA: Diagnosis not present

## 2021-10-10 DIAGNOSIS — I10 Essential (primary) hypertension: Secondary | ICD-10-CM | POA: Diagnosis not present

## 2021-10-29 DIAGNOSIS — S6992XA Unspecified injury of left wrist, hand and finger(s), initial encounter: Secondary | ICD-10-CM | POA: Diagnosis not present

## 2021-11-15 ENCOUNTER — Other Ambulatory Visit: Payer: Self-pay | Admitting: Family Medicine

## 2021-11-15 ENCOUNTER — Other Ambulatory Visit: Payer: Self-pay | Admitting: Family

## 2021-11-15 ENCOUNTER — Ambulatory Visit
Admission: RE | Admit: 2021-11-15 | Discharge: 2021-11-15 | Disposition: A | Discharge status: Home / Self Care | Payer: PPO | Attending: Family Medicine | Admitting: Family Medicine

## 2021-11-15 ENCOUNTER — Ambulatory Visit
Admission: RE | Admit: 2021-11-15 | Discharge: 2021-11-15 | Disposition: A | Discharge status: Home / Self Care | Payer: PPO | Source: Ambulatory Visit | Attending: Family Medicine | Admitting: Family Medicine

## 2021-11-15 DIAGNOSIS — R002 Palpitations: Secondary | ICD-10-CM

## 2021-11-15 DIAGNOSIS — R1084 Generalized abdominal pain: Secondary | ICD-10-CM | POA: Diagnosis not present

## 2021-11-21 ENCOUNTER — Other Ambulatory Visit: Payer: Self-pay | Admitting: Family

## 2021-11-21 ENCOUNTER — Other Ambulatory Visit: Payer: Self-pay | Admitting: Internal Medicine

## 2021-11-21 DIAGNOSIS — R002 Palpitations: Secondary | ICD-10-CM

## 2021-11-22 ENCOUNTER — Encounter: Payer: Self-pay | Admitting: Internal Medicine

## 2021-11-22 ENCOUNTER — Ambulatory Visit (INDEPENDENT_AMBULATORY_CARE_PROVIDER_SITE_OTHER): Payer: PPO | Admitting: Internal Medicine

## 2021-11-22 ENCOUNTER — Other Ambulatory Visit: Payer: Self-pay

## 2021-11-22 ENCOUNTER — Other Ambulatory Visit: Payer: Self-pay | Admitting: Family

## 2021-11-22 VITALS — BP 130/70 | HR 55 | Ht 62.0 in | Wt 141.0 lb

## 2021-11-22 DIAGNOSIS — R3589 Other polyuria: Secondary | ICD-10-CM

## 2021-11-22 DIAGNOSIS — R002 Palpitations: Secondary | ICD-10-CM

## 2021-11-22 DIAGNOSIS — I10 Essential (primary) hypertension: Secondary | ICD-10-CM | POA: Diagnosis not present

## 2021-11-22 DIAGNOSIS — R631 Polydipsia: Secondary | ICD-10-CM

## 2021-11-22 DIAGNOSIS — R0789 Other chest pain: Secondary | ICD-10-CM | POA: Diagnosis not present

## 2021-11-22 MED ORDER — NITROGLYCERIN 0.4 MG SL SUBL: SUBLINGUAL_TABLET | SUBLINGUAL | 0 refills | Status: DC

## 2021-11-22 MED ORDER — ROSUVASTATIN CALCIUM 20 MG PO TABS: 20.0000 mg | ORAL_TABLET | Freq: Every day | ORAL | 0 refills | Status: DC

## 2021-11-22 MED ORDER — DILTIAZEM HCL ER COATED BEADS 240 MG PO CP24: 240.0000 mg | ORAL_CAPSULE | Freq: Every day | ORAL | 0 refills | Status: DC

## 2021-11-22 MED ORDER — DILTIAZEM HCL 30 MG PO TABS: 30.0000 mg | ORAL_TABLET | Freq: Every day | ORAL | 0 refills | Status: DC | PRN

## 2021-11-22 NOTE — Patient Instructions (Signed)
Medication Instructions:   Your physician has recommended you make the following change in your medication:   INCREASE Diltiazem (long acting) 240 mg daily   TAKE Diltiazem (short acting) 30 mg only AS NEEDED for palpitations  Continue all remaining medications as you have been taking.   *If you need a refill on your cardiac medications before your next appointment, please call your pharmacy*   Lab Work:  None ordered  Testing/Procedures:  None ordered   Follow-Up: At [facility], you and your health needs are our priority.  As part of our continuing mission to provide you with exceptional heart care, we have created designated Provider Care Teams.  These Care Teams include your primary Cardiologist (physician) and Advanced Practice Providers (APPs -  Physician Assistants and Nurse Practitioners) who all work together to provide you with the care you need, when you need it.  We recommend signing up for the patient portal called "MyChart".  Sign up information is provided on this After Visit Summary.  MyChart is used to connect with patients for Virtual Visits (Telemedicine).  Patients are able to view lab/test results, encounter notes, upcoming appointments, etc.  Non-urgent messages can be sent to your provider as well.   To learn more about what you can do with MyChart, go to [email or web address].    Your next appointment:   3 month(s)  The format for your next appointment:   In Person  Provider:   You may see Nelva Bush, MD or one of the following Advanced Practice Providers on your designated Care Team:   Murray Hodgkins, NP Christell Faith, PA-C Cadence Kathlen [place], [place]   Other Instructions  Please discuss with your primary care provider your frequent urination and thirst.

## 2021-11-22 NOTE — Progress Notes (Signed)
Follow-up Outpatient Visit Date: 11/22/2021  Primary Care Provider: Inc, [place] [id number]  Chief Complaint: Follow-up chest pain  HPI:  Melinda Shaw is a 56 y.o. female with history of  hypertension, hyperlipidemia, bronchiectasis, fibromyalgia, migraine headaches, and anxiety/depression, who presents for follow-up of chest pain and palpitations.  Today, Melinda Shaw reports that she still has some chest pain and palpitations, though episodes are considerably less frequent since standing diltiazem was added.  Of note, Melinda Shaw takes her extended release diltiazem at night and short acting diltiazem every morning (prescribed as as needed only).  She notes that episodes of chest pain and palpitations only happen 2-3 times per month and typically last a few seconds at a time.  She notes some associated dizziness.  She is exercising regularly without limitations.  Resting heart rates on her smart watch are typically in the low to mid 60s.  She has chronic abdominal pain and constipation and more recent unintentional weight loss.  She is scheduled to see GI next month.  She has not had any shortness of breath or edema.  --------------------------------------------------------------------------------------------------  Past Medical History:  Diagnosis Date   Anemia    H/O   Anxiety    Bronchitis    Chest pain    a. 09/2018 Cath: Nl cors. EF 65%->Med rx (long-acting nitrate added).   Depression    Diastolic dysfunction    a. 10/2018 Echo: EF 55-60%, no rwma, Gr1DD. Mild MR. Nl RV fxn.   DOE (dyspnea on exertion)    Fibromyalgia    GERD (gastroesophageal reflux disease)    Headache    Hyperlipidemia    Hypertension    no meds   Palpitations    a. 01/2019 [facility]): NSR w/ rare PACs & PVCs.   PONV (postoperative nausea and vomiting)    Past Surgical History:  Procedure Laterality Date   ANTERIOR CRUCIATE LIGAMENT REPAIR     CARDIAC  CATHETERIZATION     CHOLECYSTECTOMY     CORONARY ANGIOPLASTY     ESOPHAGOGASTRODUODENOSCOPY (EGD) WITH PROPOFOL N/A 08/28/2015   Procedure: ESOPHAGOGASTRODUODENOSCOPY (EGD) WITH PROPOFOL;  Surgeon: Josefine Class, MD;  Location: [facility] ENDOSCOPY;  Service: Endoscopy;  Laterality: N/A;   KNEE SURGERY     left knee    LEFT HEART CATH AND CORONARY ANGIOGRAPHY Left 09/22/2018   Procedure: LEFT HEART CATH AND CORONARY ANGIOGRAPHY;  Surgeon: Nelva Bush, MD;  Location: [place] CV LAB;  Service: Cardiovascular;  Laterality: Left;   MASS EXCISION Left 01/26/2016   Procedure: EXCISION OF LEFT WRIST VOLAR MASS;  Surgeon: Charlotte Crumb, MD;  Location: [place];  Service: Orthopedics;  Laterality: Left;   NOSE SURGERY     POLYPECTOMY     TUBAL LIGATION     VIDEO BRONCHOSCOPY N/A 05/29/2015   Procedure: VIDEO BRONCHOSCOPY WITHOUT FLUORO;  Surgeon: Vilinda Boehringer, MD;  Location: [facility] ORS;  Service: Cardiopulmonary;  Laterality: N/A;    Current Meds  Medication Sig   albuterol (PROVENTIL HFA;VENTOLIN HFA) 108 (90 BASE) MCG/ACT inhaler Inhale 2 puffs into the lungs every 6 (six) hours as needed for wheezing or shortness of breath.   cetirizine (ZYRTEC) 10 MG tablet Take 10 mg by mouth daily.   diltiazem (CARDIZEM CD) 180 MG 24 hr capsule TAKE ONE CAPSULE BY MOUTH DAILY   diltiazem (CARDIZEM) 30 MG tablet TAKE 1 TABLET BY MOUTH ONCE DAILY AS NEEDED FOR PALPITATIONS   EPINEPHrine 0.3 mg/0.3 mL IJ SOAJ  injection Inject 0.3 mg into the muscle as needed for anaphylaxis.   gabapentin (NEURONTIN) 400 MG capsule Take 400 mg by mouth 3 (three) times daily.   hydrOXYzine (ATARAX) 25 MG tablet Take 25 mg by mouth 4 (four) times daily.   lithium carbonate 300 MG capsule Take 600 mg by mouth at bedtime.   nitroGLYCERIN (NITROSTAT) 0.4 MG SL tablet PLACE ONE TABLET UNDER THE TONGUE EVERY 5 MINUTES AS NEEDED FOR CHEST PAIN. MAXIMUM OF 3 DOSES.   omeprazole (PRILOSEC) 40 MG capsule Take  40 mg by mouth 2 (two) times daily.    ondansetron (ZOFRAN ODT) 4 MG disintegrating tablet Take 1 tablet (4 mg total) by mouth every 8 (eight) hours as needed for nausea or vomiting.   rosuvastatin (CRESTOR) 20 MG tablet Take 20 mg by mouth at bedtime.   tiZANidine (ZANAFLEX) 4 MG tablet Take 4 mg by mouth at bedtime.   topiramate (TOPAMAX) 50 MG tablet Taking 100 mg at night   venlafaxine XR (EFFEXOR-XR) 150 MG 24 hr capsule Take 150 mg by mouth daily.   zolpidem (AMBIEN) 10 MG tablet Take 10 mg by mouth at bedtime.    Allergies: Cherry, Shellfish allergy, Sulfa antibiotics, Bee venom, and Imdur [isosorbide nitrate]  Social History   Tobacco Use   Smoking status: Never   Smokeless tobacco: Never  Vaping Use   Vaping Use: Never used  Substance Use Topics   Alcohol use: No    Alcohol/week: 0.0 standard drinks   Drug use: No    Family History  Problem Relation Age of Onset   COPD Mother    High Cholesterol Mother    Heart failure Mother    Hypertension Mother    Hypertension Father    AAA (abdominal aortic aneurysm) Sister    COPD Sister    High Cholesterol Sister     Review of Systems: Melinda Shaw reports that she feels thirsty all the time and drinks lots of water (used to consume frequent Pepsi's but has switched to water).  Otherwise, a 12-system review of systems was performed and was negative except as noted in the HPI.  --------------------------------------------------------------------------------------------------  Physical Exam: BP 130/70 (BP Location: Left Arm, Patient Position: Sitting, Cuff Size: Large)    Pulse (!) 55    Ht 5\' 2"  (1.575 m)    Wt 141 lb (64 kg)    LMP 09/24/2016 (Approximate)    SpO2 99%    BMI 25.79 kg/m   General:  NAD. Neck: No JVD or HJR. Lungs: Clear to auscultation bilaterally without wheezes or crackles. Heart: Regular rate and rhythm without murmurs, rubs, or gallops. Abdomen: Soft, nontender, nondistended. Extremities: No lower  extremity edema.  EKG: Sinus bradycardia with possible left atrial enlargement and anterolateral and inferior ST/T changes, similar to prior tracings.  Lab Results  Component Value Date   WBC 9.4 05/21/2021   HGB 14.2 05/21/2021   HCT 40.9 05/21/2021   MCV 92.5 05/21/2021   PLT 229 05/21/2021    Lab Results  Component Value Date   NA 138 05/21/2021   K 3.2 (L) 05/21/2021   CL 106 05/21/2021   CO2 25 05/21/2021   BUN 10 05/21/2021   CREATININE 0.87 05/21/2021   GLUCOSE 94 05/21/2021   ALT 22 05/21/2021    --------------------------------------------------------------------------------------------------  ASSESSMENT AND PLAN: Chest pain and palpitations: Melinda Shaw notes significant improvement in her chest pain and palpitations with addition of long-acting diltiazem.  Notably, she takes her short acting (rescue) diltiazem every  morning as well to prophylax against recurrent symptoms.  We have discussed the role for her short and long-acting diltiazem.  We have agreed to increase diltiazem CD to 240 mg daily and use the short acting diltiazem only for extended breakthrough symptoms.  I am hopeful that her resting heart rate will not drop too much with this change.  EKG today again shows ST/T changes in the anterolateral and inferior leads, as seen on multiple prior tracings.  However, prior catheterization and more recent coronary CTA have shown absence of CAD.  We will plan to continue with medical therapy.  Hypertension: Blood pressure upper normal today.  As above, we will increase diltiazem to 240 mg daily.  Polyuria and polydipsia: Melinda Shaw notes that she is quite thirsty and urinates a lot during the day.  I have asked her to speak with her PCP about this further to ensure that she is not developing diabetes mellitus or diabetes insipidus given her ongoing lithium use.  Follow-up: Return to clinic in 3 months.  Nelva Bush, MD 11/22/2021 4:28 PM

## 2021-11-23 ENCOUNTER — Encounter: Payer: Self-pay | Admitting: Internal Medicine

## 2021-11-25 ENCOUNTER — Other Ambulatory Visit: Payer: Self-pay | Admitting: Internal Medicine

## 2021-12-19 ENCOUNTER — Encounter: Payer: Self-pay | Admitting: Gastroenterology

## 2021-12-19 ENCOUNTER — Ambulatory Visit
Admission: RE | Admit: 2021-12-19 | Discharge: 2021-12-19 | Disposition: A | Discharge status: Home / Self Care | Payer: PPO | Source: Ambulatory Visit | Attending: Family Medicine | Admitting: Family Medicine

## 2021-12-19 ENCOUNTER — Ambulatory Visit (INDEPENDENT_AMBULATORY_CARE_PROVIDER_SITE_OTHER): Payer: PPO | Admitting: Gastroenterology

## 2021-12-19 ENCOUNTER — Other Ambulatory Visit: Payer: Self-pay

## 2021-12-19 ENCOUNTER — Other Ambulatory Visit: Payer: Self-pay | Admitting: Family Medicine

## 2021-12-19 VITALS — BP 148/84 | HR 76 | Temp 98.4°F | Wt 135.0 lb

## 2021-12-19 DIAGNOSIS — R634 Abnormal weight loss: Secondary | ICD-10-CM | POA: Insufficient documentation

## 2021-12-19 DIAGNOSIS — K769 Liver disease, unspecified: Secondary | ICD-10-CM

## 2021-12-19 DIAGNOSIS — E785 Hyperlipidemia, unspecified: Secondary | ICD-10-CM | POA: Insufficient documentation

## 2021-12-19 DIAGNOSIS — K5900 Constipation, unspecified: Secondary | ICD-10-CM

## 2021-12-19 DIAGNOSIS — Z6826 Body mass index (BMI) 26.0-26.9, adult: Secondary | ICD-10-CM | POA: Insufficient documentation

## 2021-12-19 DIAGNOSIS — K7460 Unspecified cirrhosis of liver: Secondary | ICD-10-CM

## 2021-12-19 DIAGNOSIS — I020 Rheumatic chorea with heart involvement: Secondary | ICD-10-CM | POA: Insufficient documentation

## 2021-12-19 DIAGNOSIS — I5189 Other ill-defined heart diseases: Secondary | ICD-10-CM | POA: Insufficient documentation

## 2021-12-19 DIAGNOSIS — J479 Bronchiectasis, uncomplicated: Secondary | ICD-10-CM | POA: Insufficient documentation

## 2021-12-19 DIAGNOSIS — J151 Pneumonia due to Pseudomonas: Secondary | ICD-10-CM | POA: Insufficient documentation

## 2021-12-19 DIAGNOSIS — Z23 Encounter for immunization: Secondary | ICD-10-CM | POA: Insufficient documentation

## 2021-12-19 DIAGNOSIS — F419 Anxiety disorder, unspecified: Secondary | ICD-10-CM | POA: Insufficient documentation

## 2021-12-19 DIAGNOSIS — G4700 Insomnia, unspecified: Secondary | ICD-10-CM | POA: Insufficient documentation

## 2021-12-19 DIAGNOSIS — R413 Other amnesia: Secondary | ICD-10-CM | POA: Insufficient documentation

## 2021-12-19 DIAGNOSIS — T8489XA Other specified complication of internal orthopedic prosthetic devices, implants and grafts, initial encounter: Secondary | ICD-10-CM | POA: Insufficient documentation

## 2021-12-19 DIAGNOSIS — R519 Headache, unspecified: Secondary | ICD-10-CM | POA: Insufficient documentation

## 2021-12-19 DIAGNOSIS — R42 Dizziness and giddiness: Secondary | ICD-10-CM | POA: Insufficient documentation

## 2021-12-19 DIAGNOSIS — K219 Gastro-esophageal reflux disease without esophagitis: Secondary | ICD-10-CM | POA: Insufficient documentation

## 2021-12-19 MED ORDER — PEG 3350-KCL-NA BICARB-NACL 420 G PO SOLR: ORAL | 0 refills | Status: DC

## 2021-12-19 NOTE — Progress Notes (Signed)
Melinda Bellows MD, MRCP(U.K) [address]  [place]  [place], [place] [address]  Main: [phone]  Fax: [phone]   Gastroenterology Consultation  Referring Provider:     [facility], [place] Physician:  Weyman Rodney, MD Primary Gastroenterologist:  Dr. Jonathon Shaw  Reason for Consultation:   Abdominal pain        HPI:   Melinda Shaw is a 56 y.o. y/o female referred for consultation & management  by Dr. Sharene Skeans, Waunita Schooner, MD.     She has been referred for abdominal pain.  05/21/2021 had a CT scan of the abdomen that showed gas and fluid levels throughout the colon suggestive of diarrheal state.  Clinical changes suggestive of cirrhosis without features of portal hypertension.  Nonemergent MRI of the abdomen recommended. 11/15/2021 x-ray abdomen showed nonobstructive bowel gas pattern. 05/21/2021: Hemoglobin 14.2 g 01/29/2021 hepatitis C antibody not detected, nonreactive hepatitis a IgM, hepatitis B core antibody nonreactive hepatitis B surface antibody positive hepatitis B surface antigen nonreactive.  Previously seen by [facility] hepatology in March 5027 of nonalcoholic fatty liver disease and abdominal pain.  She was diagnosed with metabolic syndrome and noted to have mildly elevated LFTs at that point of time there is concern for significant weight loss and early satiety history of appendicular mucinous neoplasm there is concern that she was exposed to many animals and was recommended to undergo an EGD and colonoscopy as follow-up.  She was to return to the clinic in 3 months.  She underwent an upper endoscopy on 03/16/2021 which showed normal esophagus erythematous mucosa in the stomach and duodenum that was biopsied repeat endoscopy in 3 years was recommended.  She also underwent a colonoscopy on the same day that showed prior end-to-side ileocolonic anastomosis in the ascending colon diverticulosis and 2 subcentimeter polyps were resected.  Biopsies were taken  throughout the entire colon for histology.  It showed chronic inactive gastritis and normal biopsies of the colon and the polyp was a tubular adenoma.  No recent LFTs of CMP.  She has lost over 50 pounds of weight over the past 1 year.  Unintentional.  No dysphagia, no rectal bleeding.  Chronic constipation for many years since she was a child.  Takes MiraLAX daily does not have a bowel movement for 8 days up to a week.  Significant abdominal discomfort generalized which is usually better after good bowel movement.  Never smoked has been diagnosed with COPD.  No excess alcohol consumption.  No family history of liver disease.  She is very concerned about her unintentional weight loss.  She was told at [facility] by Dr. Ahmed Prima that she does not have cirrhosis rather has nonalcoholic fatty liver disease.  Past Medical History:  Diagnosis Date   Anemia    H/O   Anxiety    Bronchitis    Chest pain    a. 09/2018 Cath: Nl cors. EF 65%->Med rx (long-acting nitrate added).   Depression    Diastolic dysfunction    a. 10/2018 Echo: EF 55-60%, no rwma, Gr1DD. Mild MR. Nl RV fxn.   DOE (dyspnea on exertion)    Fibromyalgia    GERD (gastroesophageal reflux disease)    Headache    Hyperlipidemia    Hypertension    no meds   Palpitations    a. 01/2019 [facility]): NSR w/ rare PACs & PVCs.   PONV (postoperative nausea and vomiting)     Past Surgical History:  Procedure Laterality Date  ANTERIOR CRUCIATE LIGAMENT REPAIR     CARDIAC CATHETERIZATION     CHOLECYSTECTOMY     CORONARY ANGIOPLASTY     ESOPHAGOGASTRODUODENOSCOPY (EGD) WITH PROPOFOL N/A 08/28/2015   Procedure: ESOPHAGOGASTRODUODENOSCOPY (EGD) WITH PROPOFOL;  Surgeon: Josefine Class, MD;  Location: [facility] ENDOSCOPY;  Service: Endoscopy;  Laterality: N/A;   KNEE SURGERY     left knee    LEFT HEART CATH AND CORONARY ANGIOGRAPHY Left 09/22/2018   Procedure: LEFT HEART CATH AND CORONARY ANGIOGRAPHY;  Surgeon: Nelva Bush, MD;  Location:  [facility] CV [facility];  Service: Cardiovascular;  Laterality: Left;   MASS EXCISION Left 01/26/2016   Procedure: EXCISION OF LEFT WRIST VOLAR MASS;  Surgeon: Charlotte Crumb, MD;  Location: [facility];  Service: Orthopedics;  Laterality: Left;   NOSE SURGERY     POLYPECTOMY     TUBAL LIGATION     VIDEO BRONCHOSCOPY N/A 05/29/2015   Procedure: VIDEO BRONCHOSCOPY WITHOUT FLUORO;  Surgeon: Vilinda Boehringer, MD;  Location: [facility] ORS;  Service: Cardiopulmonary;  Laterality: N/A;    Prior to Admission medications   Medication Sig Start Date End Date Taking? Authorizing Provider  albuterol (PROVENTIL HFA;VENTOLIN HFA) 108 (90 BASE) MCG/ACT inhaler Inhale 2 puffs into the lungs every 6 (six) hours as needed for wheezing or shortness of breath.    [provider]  cetirizine (ZYRTEC) 10 MG tablet Take 10 mg by mouth daily. 01/20/21   [provider]  diltiazem (CARDIZEM CD) 240 MG 24 hr capsule Take 1 capsule (240 mg total) by mouth daily. 11/22/21 02/20/22  End, Harrell Gave, MD  diltiazem (CARDIZEM) 30 MG tablet Take 1 tablet (30 mg total) by mouth daily as needed (palpitations). 11/22/21   End, Harrell Gave, MD  EPINEPHrine 0.3 mg/0.3 mL IJ SOAJ injection Inject 0.3 mg into the muscle as needed for anaphylaxis.    [provider]  gabapentin (NEURONTIN) 400 MG capsule Take 400 mg by mouth 3 (three) times daily. 02/05/20   [provider]  hydrOXYzine (ATARAX) 25 MG tablet Take 25 mg by mouth 4 (four) times daily.    [provider]  lithium carbonate 300 MG capsule Take 600 mg by mouth at bedtime.    [provider]  nitroGLYCERIN (NITROSTAT) 0.4 MG SL tablet PLACE ONE TABLET UNDER THE TONGUE EVERY 5 MINUTES AS NEEDED FOR CHEST PAIN. MAXIMUM OF 3 DOSES. 11/22/21   End, Harrell Gave, MD  omeprazole (PRILOSEC) 40 MG capsule Take 40 mg by mouth 2 (two) times daily.     [provider]  ondansetron (ZOFRAN ODT) 4 MG disintegrating tablet Take  1 tablet (4 mg total) by mouth every 8 (eight) hours as needed for nausea or vomiting. 05/21/21   Blake Divine, MD  rosuvastatin (CRESTOR) 20 MG tablet Take 1 tablet (20 mg total) by mouth at bedtime. 11/22/21   End, Harrell Gave, MD  tiZANidine (ZANAFLEX) 4 MG tablet Take 4 mg by mouth at bedtime.    [provider]  topiramate (TOPAMAX) 50 MG tablet Taking 100 mg at night 09/12/20   [provider]  venlafaxine XR (EFFEXOR-XR) 150 MG 24 hr capsule Take 150 mg by mouth daily.    [provider]  zolpidem (AMBIEN) 10 MG tablet Take 10 mg by mouth at bedtime.    [provider]    Family History  Problem Relation Age of Onset   COPD Mother    High Cholesterol Mother    Heart failure Mother    Hypertension Mother  Hypertension Father    AAA (abdominal aortic aneurysm) Sister    COPD Sister    High Cholesterol Sister      Social History   Tobacco Use   Smoking status: Never   Smokeless tobacco: Never  Vaping Use   Vaping Use: Never used  Substance Use Topics   Alcohol use: No    Alcohol/week: 0.0 standard drinks   Drug use: No    Allergies as of 12/19/2021 - Review Complete 12/19/2021  Allergen Reaction Noted   Cherry Swelling 02/22/2015   Shellfish allergy Shortness Of Breath 05/25/2015   Sulfa antibiotics Shortness Of Breath 02/22/2015   Bee venom  02/22/2015   Imdur [isosorbide nitrate]  12/31/2019    Review of Systems:    All systems reviewed and negative except where noted in HPI.   Physical Exam:  BP (!) 148/84    Pulse 76    Temp 98.4 F (36.9 C) (Oral)    Wt 135 lb (61.2 kg)    LMP 09/24/2016 (Approximate)    BMI 24.69 kg/m  Patient's last menstrual period was 09/24/2016 (approximate). Psych:  Alert and cooperative. Normal mood and affect. General:   Alert,  Well-developed, well-nourished, pleasant and cooperative in NAD Head:  Normocephalic and atraumatic. Eyes:  Sclera clear, no icterus.   Conjunctiva pink. Ears:   Normal auditory acuity. Lungs:  Respirations even and unlabored.  Clear throughout to auscultation.   No wheezes, crackles, or rhonchi. No acute distress. Heart:  Regular rate and rhythm; no murmurs, clicks, rubs, or gallops. Abdomen:  Normal bowel sounds.  No bruits.  Soft, non-tender and non-distended without masses, hepatosplenomegaly or hernias noted.  No guarding or rebound tenderness.    Neurologic:  Alert and oriented x3;  grossly normal neurologically. Psych:  Alert and cooperative. Normal mood and affect.  Imaging Studies: No results found.  Assessment and Plan:   MARIS ABASCAL is a 56 y.o. y/o female has been referred for abdominal pain, unintentional weight loss.  Prior history of liver cirrhosis seen by [facility] hepatology and felt it to be secondary to NAFLD.  I do not see a full autoimmune and viral hepatitis work-up.  Ultrasound at that point of time recommended an MRI of the liver.  Plan 1.  Complete autoimmune and viral hepatitis work-up for liver cirrhosis 2.  MRI of the liver to screen for [facility] as well as for abnormal findings seen previously on ultrasound of liver 3.  If no abnormality seen on MRI will need CT scan of the chest as well. 4.  In view of constipation would suggest to commence on Linzess 290 mcg samples will be provided for 1 week and if it works she should call our office for prescription.  Cleanout with GoLytely before commencing on it 5.  H. pylori breath test, CMP, CBC, PT/INR to calculate MELD score.  Check TSH in view of unintentional weight loss  Follow up in 6-8 weeks  Dr Melinda Bellows MD,MRCP(U.K)

## 2021-12-21 LAB — H. PYLORI BREATH TEST: H pylori Breath Test: NEGATIVE

## 2021-12-25 LAB — CBC WITH DIFFERENTIAL/PLATELET
Basophils Absolute: 0 10*3/uL (ref 0.0–0.2)
Basos: 1 %
EOS (ABSOLUTE): 0.2 10*3/uL (ref 0.0–0.4)
Eos: 4 %
Hematocrit: 39.2 % (ref 34.0–46.6)
Hemoglobin: 13.5 g/dL (ref 11.1–15.9)
Immature Grans (Abs): 0 10*3/uL (ref 0.0–0.1)
Immature Granulocytes: 0 %
Lymphocytes Absolute: 2.1 10*3/uL (ref 0.7–3.1)
Lymphs: 33 %
MCH: 32.1 pg (ref 26.6–33.0)
MCHC: 34.4 g/dL (ref 31.5–35.7)
MCV: 93 fL (ref 79–97)
Monocytes Absolute: 0.3 10*3/uL (ref 0.1–0.9)
Monocytes: 5 %
Neutrophils Absolute: 3.8 10*3/uL (ref 1.4–7.0)
Neutrophils: 57 %
Platelets: 253 10*3/uL (ref 150–450)
RBC: 4.2 x10E6/uL (ref 3.77–5.28)
RDW: 12.7 % (ref 11.7–15.4)
WBC: 6.5 10*3/uL (ref 3.4–10.8)

## 2021-12-25 LAB — HIV ANTIBODY (ROUTINE TESTING W REFLEX): HIV Screen 4th Generation wRfx: NONREACTIVE

## 2021-12-25 LAB — IRON,TIBC AND FERRITIN PANEL
Ferritin: 110 ng/mL (ref 15–150)
Iron Saturation: 30 % (ref 15–55)
Iron: 112 ug/dL (ref 27–159)
Total Iron Binding Capacity: 370 ug/dL (ref 250–450)
UIBC: 258 ug/dL (ref 131–425)

## 2021-12-25 LAB — PROTIME-INR
INR: 1 (ref 0.9–1.2)
Prothrombin Time: 10.6 s (ref 9.1–12.0)

## 2021-12-25 LAB — COMPREHENSIVE METABOLIC PANEL
ALT: 38 IU/L — ABNORMAL HIGH (ref 0–32)
AST: 33 IU/L (ref 0–40)
Albumin/Globulin Ratio: 1.8 (ref 1.2–2.2)
Albumin: 4.9 g/dL (ref 3.8–4.9)
Alkaline Phosphatase: 167 IU/L — ABNORMAL HIGH (ref 44–121)
BUN/Creatinine Ratio: 10 (ref 9–23)
BUN: 8 mg/dL (ref 6–24)
Bilirubin Total: 0.5 mg/dL (ref 0.0–1.2)
CO2: 22 mmol/L (ref 20–29)
Calcium: 10.3 mg/dL — ABNORMAL HIGH (ref 8.7–10.2)
Chloride: 106 mmol/L (ref 96–106)
Creatinine, Ser: 0.84 mg/dL (ref 0.57–1.00)
Globulin, Total: 2.8 g/dL (ref 1.5–4.5)
Glucose: 90 mg/dL (ref 70–99)
Potassium: 3.5 mmol/L (ref 3.5–5.2)
Sodium: 140 mmol/L (ref 134–144)
Total Protein: 7.7 g/dL (ref 6.0–8.5)
eGFR: 82 mL/min/{1.73_m2} (ref >59)

## 2021-12-25 LAB — IMMUNOGLOBULINS A/E/G/M, SERUM
IgA/Immunoglobulin A, Serum: 330 mg/dL (ref 87–352)
IgE (Immunoglobulin E), Serum: 160 IU/mL (ref 6–495)
IgG (Immunoglobin G), Serum: 1204 mg/dL (ref 586–1602)
IgM (Immunoglobulin M), Srm: 78 mg/dL (ref 26–217)

## 2021-12-25 LAB — TSH: TSH: 3.4 u[IU]/mL (ref 0.450–4.500)

## 2021-12-25 LAB — HEPATITIS C ANTIBODY: Hep C Virus Ab: <0.1 s/co ratio (ref 0.0–0.9)

## 2021-12-25 LAB — ANTI-MICROSOMAL ANTIBODY LIVER / KIDNEY: LKM1 Ab: 0.9 Units (ref 0.0–20.0)

## 2021-12-25 LAB — HEPATITIS B E ANTIGEN: Hep B E Ag: NEGATIVE

## 2021-12-25 LAB — HEPATITIS B CORE ANTIBODY, TOTAL: Hep B Core Total Ab: NEGATIVE

## 2021-12-25 LAB — MITOCHONDRIAL/SMOOTH MUSCLE AB PNL
Mitochondrial Ab: <20 Units (ref 0.0–20.0)
Smooth Muscle Ab: 65 Units — ABNORMAL HIGH (ref 0–19)

## 2021-12-25 LAB — CERULOPLASMIN: Ceruloplasmin: 26.7 mg/dL (ref 19.0–39.0)

## 2021-12-25 LAB — CELIAC DISEASE AB SCREEN W/RFX
Antigliadin Abs, IgA: 3 units (ref 0–19)
Transglutaminase IgA: <2 U/mL (ref 0–3)

## 2021-12-25 LAB — HEPATITIS B E ANTIBODY: Hep B E Ab: NEGATIVE

## 2021-12-25 LAB — HEPATITIS B SURFACE ANTIGEN: Hepatitis B Surface Ag: NEGATIVE

## 2021-12-25 LAB — ALPHA-1-ANTITRYPSIN: A-1 Antitrypsin: 167 mg/dL (ref 101–187)

## 2021-12-25 LAB — HEPATITIS B SURFACE ANTIBODY,QUALITATIVE: Hep B Surface Ab, Qual: REACTIVE

## 2021-12-25 LAB — ANA: Anti Nuclear Antibody (ANA): NEGATIVE

## 2021-12-25 LAB — CK: Total CK: 89 U/L (ref 32–182)

## 2021-12-25 LAB — HEPATITIS A ANTIBODY, TOTAL: hep A Total Ab: POSITIVE — AB

## 2021-12-27 ENCOUNTER — Telehealth: Payer: Self-pay

## 2021-12-27 NOTE — Telephone Encounter (Signed)
Pt stating she is having an MRI on 12/31/21. She stated she had requested something to help get through the MRI. Please send Ativan or Valium to [facility].

## 2021-12-28 ENCOUNTER — Other Ambulatory Visit: Payer: Self-pay

## 2021-12-28 MED ORDER — ALPRAZOLAM 0.5 MG PO TABS: 0.5000 mg | ORAL_TABLET | ORAL | 0 refills | Status: AC

## 2021-12-28 NOTE — Telephone Encounter (Signed)
Patient is returning your call.  

## 2021-12-28 NOTE — Telephone Encounter (Signed)
Called patient but had to leave her a voicemail letting her know that the prescription that she requested was sent to her [facility].

## 2021-12-28 NOTE — Telephone Encounter (Signed)
Called patient back and left her a voicemail and let her know that the prescription was sent to her pharmacy.

## 2021-12-28 NOTE — Telephone Encounter (Signed)
Called patient back to let her know that her prescription was sent to [facility] and that it should be ready for pick up within an hour. Patient was also told to take a driver to her appointment for her MRI since it will make her sleepy. Patient stated that she had a driver. Patient understood and had no further questions.

## 2021-12-31 ENCOUNTER — Other Ambulatory Visit: Payer: Self-pay

## 2021-12-31 ENCOUNTER — Encounter: Payer: Self-pay | Admitting: Gastroenterology

## 2021-12-31 ENCOUNTER — Ambulatory Visit
Admission: RE | Admit: 2021-12-31 | Discharge: 2021-12-31 | Disposition: A | Discharge status: Home / Self Care | Payer: PPO | Source: Ambulatory Visit | Attending: Gastroenterology | Admitting: Gastroenterology

## 2021-12-31 DIAGNOSIS — K7460 Unspecified cirrhosis of liver: Secondary | ICD-10-CM | POA: Insufficient documentation

## 2021-12-31 MED ORDER — GADOBUTROL 1 MMOL/ML IV SOLN
6.0000 mL | Freq: Once | INTRAVENOUS | Status: AC | PRN
  Administered 2021-12-31: 6 mL via INTRAVENOUS

## 2022-01-01 ENCOUNTER — Telehealth: Payer: Self-pay | Admitting: Gastroenterology

## 2022-01-01 ENCOUNTER — Other Ambulatory Visit: Payer: Self-pay

## 2022-01-01 MED ORDER — LUBIPROSTONE 8 MCG PO CAPS: 8.0000 ug | ORAL_CAPSULE | Freq: Two times a day (BID) | ORAL | 3 refills | Status: DC

## 2022-01-01 NOTE — Telephone Encounter (Signed)
Patient had sent [place] a MyChart message as well and her results were provided by Dr. Vicente Males through there.

## 2022-01-01 NOTE — Telephone Encounter (Signed)
Patient left VM requesting call back for MRI results

## 2022-01-01 NOTE — Progress Notes (Signed)
° °  Inform that the MRI showed liver cirrhosis but no concerning lesions.  There is fat seen in the liver.  Some areas which appear like nodes and  likely  to be fatty infiltration.  Overall nothing concerning at this point of time

## 2022-01-07 ENCOUNTER — Telehealth: Payer: Self-pay | Admitting: Gastroenterology

## 2022-01-07 NOTE — Telephone Encounter (Signed)
Patient states that she cannot afford the medication that DR Vicente Males prescribed. Requesting call back.

## 2022-01-08 NOTE — Telephone Encounter (Signed)
A prior auth had been sent to patient's insurance and hopefully they could approve for her to continue taking Amitiza 16 mcg.

## 2022-01-09 ENCOUNTER — Other Ambulatory Visit: Payer: Self-pay

## 2022-01-10 ENCOUNTER — Other Ambulatory Visit: Payer: Self-pay

## 2022-01-10 MED ORDER — LINACLOTIDE 290 MCG PO CAPS: 290.0000 ug | ORAL_CAPSULE | Freq: Every day | ORAL | 3 refills | Status: DC

## 2022-01-10 NOTE — Telephone Encounter (Signed)
Amitiza was approved by patient's insurance. Patient and her pharmacy were notified. ?

## 2022-01-28 ENCOUNTER — Encounter: Payer: Self-pay | Admitting: Gastroenterology

## 2022-01-28 ENCOUNTER — Other Ambulatory Visit: Payer: Self-pay

## 2022-01-28 ENCOUNTER — Ambulatory Visit: Payer: PPO | Admitting: Gastroenterology

## 2022-01-28 VITALS — BP 145/85 | HR 59 | Temp 98.2°F | Wt 137.0 lb

## 2022-01-28 DIAGNOSIS — R899 Unspecified abnormal finding in specimens from other organs, systems and tissues: Secondary | ICD-10-CM

## 2022-01-28 DIAGNOSIS — K7460 Unspecified cirrhosis of liver: Secondary | ICD-10-CM

## 2022-01-28 DIAGNOSIS — K5900 Constipation, unspecified: Secondary | ICD-10-CM | POA: Diagnosis not present

## 2022-01-28 DIAGNOSIS — R748 Abnormal levels of other serum enzymes: Secondary | ICD-10-CM | POA: Diagnosis not present

## 2022-01-28 NOTE — Progress Notes (Signed)
?  ?[name] MD, MRCP(U.K) ?[address]  ?[address]  ?[place], [place] [address]  ?Main: [phone]  ?Fax: [phone] ? ? ?Primary Care Physician: Weyman Rodney, MD ? ?Primary Gastroenterologist:  Dr. [name]  ? ?Chief Complaint  ?Patient presents with  ? Cirrhosis  ? ? ?HPI: Melinda Shaw is a 56 y.o. female ? ? ?Summary of history : ? ?Initially referred and seen in February 2023 for abdominal pain.  In July 2022 had a CT scan of the abdomen that showed gas and fluid levels throughout the colon suggestive for diarrheal state.  Incidental findings of cirrhosis of the liver without portal hypertension. ? ?11/15/2021 x-ray abdomen showed nonobstructive bowel gas pattern. ?05/21/2021: Hemoglobin 14.2 g ?01/29/2021 hepatitis C antibody not detected, nonreactive hepatitis a IgM, hepatitis B core antibody nonreactive hepatitis B surface antibody positive hepatitis B surface antigen nonreactive. ?  ?Previously seen by [facility] hepatology in March 8366 of nonalcoholic fatty liver disease and abdominal pain.  She was diagnosed with metabolic syndrome and noted to have mildly elevated LFTs at that point of time there is concern for significant weight loss and early satiety history of appendicular mucinous neoplasm ,   She underwent an upper endoscopy on 03/16/2021 which showed normal esophagus erythematous mucosa in the stomach and duodenum that was biopsied repeat endoscopy in 3 years was recommended.  She also underwent a colonoscopy on the same day that showed prior end-to-side ileocolonic anastomosis in the ascending colon diverticulosis and 2 subcentimeter polyps were resected.  Biopsies were taken throughout the entire colon for histology.  It showed chronic inactive gastritis and normal biopsies of the colon and the polyp was a tubular adenoma. ?  ?No recent LFTs of CMP. ?  ?She has lost over 50 pounds of weight over the past 1 year.  Unintentional.   Takes MiraLAX daily does not have a bowel movement for 8  days up to a week.  Significant abdominal discomfort generalized which is usually better after good bowel movement.  Never smoked has been diagnosed with COPD.  No excess alcohol consumption.  No family history of liver disease.  She is very concerned about her unintentional weight loss.  She was told at [facility] by Dr. Ahmed Prima that she does not have cirrhosis rather has nonalcoholic fatty liver disease. ? ? ?Interval history 12/19/2021-01/28/2022 ?12/19/2021 H. pylori breath test, viral hepatitis work-up was negative.  Immune to hepatitis a and B.  Smooth muscle antibody was positive at 65 hemoglobin 13.5 g with a platelet count of 253 CMP showed elevated alkaline phosphatase at 167 but AST and ALT were in the normal range. ? ?01/01/2022 MRI of the liver shows cirrhotic morphology without evidence of portal hypertension.  Benign focal nodular fatty infiltration noted.  Status post cholecystectomy. ? ?12/31/2021: MELD score 6 ?Denies any alcohol use.  Denies any other complaints. ? ?Current Outpatient Medications  ?Medication Sig Dispense Refill  ? albuterol (PROVENTIL HFA;VENTOLIN HFA) 108 (90 BASE) MCG/ACT inhaler Inhale 2 puffs into the lungs every 6 (six) hours as needed for wheezing or shortness of breath.    ? diltiazem (CARDIZEM CD) 240 MG 24 hr capsule Take 1 capsule (240 mg total) by mouth daily. 90 capsule 0  ? diltiazem (CARDIZEM) 30 MG tablet Take 1 tablet (30 mg total) by mouth daily as needed (palpitations). 90 tablet 0  ? EPINEPHrine 0.3 mg/0.3 mL IJ SOAJ injection Inject 0.3 mg into the muscle as needed for anaphylaxis.    ? gabapentin (  NEURONTIN) 400 MG capsule Take 400 mg by mouth 3 (three) times daily.    ? hydrOXYzine (VISTARIL) 25 MG capsule Take 25 mg by mouth 2 (two) times daily.    ? linaclotide (LINZESS) 290 MCG CAPS capsule Take 1 capsule (290 mcg total) by mouth daily before breakfast. 30 capsule 3  ? lithium carbonate 300 MG capsule Take 600 mg by mouth at bedtime.    ? nitroGLYCERIN (NITROSTAT) 0.4  MG SL tablet PLACE ONE TABLET UNDER THE TONGUE EVERY 5 MINUTES AS NEEDED FOR CHEST PAIN. MAXIMUM OF 3 DOSES. 25 tablet 0  ? omeprazole (PRILOSEC) 40 MG capsule Take 40 mg by mouth 2 (two) times daily.     ? ondansetron (ZOFRAN ODT) 4 MG disintegrating tablet Take 1 tablet (4 mg total) by mouth every 8 (eight) hours as needed for nausea or vomiting. 12 tablet 0  ? rosuvastatin (CRESTOR) 20 MG tablet Take 1 tablet (20 mg total) by mouth at bedtime. 90 tablet 0  ? tiZANidine (ZANAFLEX) 4 MG tablet Take 4 mg by mouth at bedtime.    ? topiramate (TOPAMAX) 50 MG tablet Taking 100 mg at night    ? zolpidem (AMBIEN) 10 MG tablet Take 10 mg by mouth at bedtime.    ? ?No current facility-administered medications for this visit.  ? ? ?Allergies as of 01/28/2022 - Review Complete 01/28/2022  ?Allergen Reaction Noted  ? Cherry Swelling 02/22/2015  ? Shellfish allergy Shortness Of Breath 05/25/2015  ? Sulfa antibiotics Shortness Of Breath 02/22/2015  ? Bee venom  02/22/2015  ? Imdur [isosorbide nitrate]  12/31/2019  ? ? ?ROS: ? ?General: Negative for anorexia, weight loss, fever, chills, fatigue, weakness. ?ENT: Negative for hoarseness, difficulty swallowing , nasal congestion. ?CV: Negative for chest pain, angina, palpitations, dyspnea on exertion, peripheral edema.  ?Respiratory: Negative for dyspnea at rest, dyspnea on exertion, cough, sputum, wheezing.  ?GI: See history of present illness. ?GU:  Negative for dysuria, hematuria, urinary incontinence, urinary frequency, nocturnal urination.  ?Endo: Negative for unusual weight change.  ?  ?Physical Examination: ? ? BP (!) 145/85   Pulse (!) 59   Temp 98.2 ?F (36.8 ?C) (Oral)   Wt 137 lb (62.1 kg)   LMP 09/24/2016 (Approximate)   BMI 25.06 kg/m?  ? ?General: Well-nourished, well-developed in no acute distress.  ?Eyes: No icterus. Conjunctivae pink. ?Neuro: Alert and oriented x 3.  Grossly intact. ?Skin: Warm and dry, no jaundice.   ?Psych: Alert and cooperative, normal mood  and affect. ? ? ?Imaging Studies: ?MR LIVER W WO CONTRAST ? ?Result Date: 01/01/2022 ?CLINICAL DATA:  Chronic abdominal pain x2 years. Cirrhotic morphology seen on prior CT. Evaluate hypodense areas along the falciform ligament and gallbladder fossa seen on prior CT. EXAM: MRI ABDOMEN WITHOUT AND WITH CONTRAST TECHNIQUE: Multiplanar multisequence MR imaging of the abdomen was performed both before and after the administration of intravenous contrast. CONTRAST:  44m GADAVIST GADOBUTROL 1 MMOL/ML IV SOLN COMPARISON:  CT May 21, 2021 FINDINGS: Lower chest: No acute abnormality. Hepatobiliary: Hepatic contour nodularity with enlargement of the caudate and lateral left lobe of the liver, suggestive of cirrhosis. Evaluation for hepatic steatosis is limited by respiratory motion on in and out of phase imaging. However there is a hypoenhancing area along the anterior of segment IV along the falciform ligament on image 36/15 as well as 2 smaller and more subtle areas along the gallbladder fossa on image 45/15, these corresponds the hypodense areas seen on prior CT with the area  along the falciform ligament demonstrating some signal dropout on imaging on out of phase image 33/9 and the focus along the gallbladder fossa probably demonstrating signal dropout on image 37/9. No suspicious hepatic lesions. Gallbladder surgically absent. Similar prominence of the biliary tree with the common duct measuring 6 mm favored reservoir effect post cholecystectomy. Pancreas: Intrinsic T1 signal of the pancreatic parenchyma is within normal limits. Symmetric enhancement of the pancreatic parenchyma postcontrast administration. No pancreatic ductal dilation. No cystic or solid hyperenhancing pancreatic lesion identified. Spleen:  Normal size spleen without focal abnormality. Adrenals/Urinary Tract: Bilateral adrenal growth appear normal. No hydronephrosis. No solid enhancing renal mass. Stomach/Bowel: Visualized portions within the abdomen  are unremarkable. Vascular/Lymphatic: The portal, splenic and superior mesenteric veins are patent. Normal caliber abdominal aorta. Prominent periportal lymph nodes measuring up to 8 mm in short axis are fav

## 2022-01-29 ENCOUNTER — Other Ambulatory Visit: Payer: Self-pay | Admitting: Family Medicine

## 2022-01-29 DIAGNOSIS — Z1231 Encounter for screening mammogram for malignant neoplasm of breast: Secondary | ICD-10-CM

## 2022-01-30 LAB — PTH, INTACT AND CALCIUM
Calcium: 10.8 mg/dL — ABNORMAL HIGH (ref 8.7–10.2)
PTH: 39 pg/mL (ref 15–65)

## 2022-01-30 LAB — GAMMA GT: GGT: 100 IU/L — ABNORMAL HIGH (ref 0–60)

## 2022-01-30 LAB — MITOCHONDRIAL/SMOOTH MUSCLE AB PNL
Mitochondrial Ab: <20 Units (ref 0.0–20.0)
Smooth Muscle Ab: 51 Units — ABNORMAL HIGH (ref 0–19)

## 2022-01-30 LAB — SOLUBLE LIVER AG (IGG AB): Anti-SLA, IgG: 0.7 units (ref 0.0–20.0)

## 2022-01-31 ENCOUNTER — Encounter
Admission: RE | Disposition: A | Discharge status: Home / Self Care | Payer: Self-pay | Source: Home / Self Care | Attending: Gastroenterology

## 2022-01-31 ENCOUNTER — Ambulatory Visit: Payer: PPO | Admitting: Anesthesiology

## 2022-01-31 ENCOUNTER — Ambulatory Visit
Admission: RE | Admit: 2022-01-31 | Discharge: 2022-01-31 | Disposition: A | Discharge status: Home / Self Care | Payer: PPO | Attending: Gastroenterology | Admitting: Gastroenterology

## 2022-01-31 ENCOUNTER — Encounter: Payer: Self-pay | Admitting: Gastroenterology

## 2022-01-31 ENCOUNTER — Telehealth: Payer: Self-pay

## 2022-01-31 DIAGNOSIS — E785 Hyperlipidemia, unspecified: Secondary | ICD-10-CM | POA: Diagnosis not present

## 2022-01-31 DIAGNOSIS — R634 Abnormal weight loss: Secondary | ICD-10-CM

## 2022-01-31 DIAGNOSIS — J449 Chronic obstructive pulmonary disease, unspecified: Secondary | ICD-10-CM | POA: Insufficient documentation

## 2022-01-31 DIAGNOSIS — F32A Depression, unspecified: Secondary | ICD-10-CM | POA: Diagnosis not present

## 2022-01-31 DIAGNOSIS — F419 Anxiety disorder, unspecified: Secondary | ICD-10-CM | POA: Diagnosis not present

## 2022-01-31 DIAGNOSIS — I10 Essential (primary) hypertension: Secondary | ICD-10-CM | POA: Insufficient documentation

## 2022-01-31 DIAGNOSIS — K7460 Unspecified cirrhosis of liver: Secondary | ICD-10-CM

## 2022-01-31 DIAGNOSIS — K219 Gastro-esophageal reflux disease without esophagitis: Secondary | ICD-10-CM | POA: Insufficient documentation

## 2022-01-31 DIAGNOSIS — M797 Fibromyalgia: Secondary | ICD-10-CM | POA: Insufficient documentation

## 2022-01-31 DIAGNOSIS — I4891 Unspecified atrial fibrillation: Secondary | ICD-10-CM | POA: Insufficient documentation

## 2022-01-31 HISTORY — PX: ESOPHAGOGASTRODUODENOSCOPY (EGD) WITH PROPOFOL: SHX5813

## 2022-01-31 SURGERY — ESOPHAGOGASTRODUODENOSCOPY (EGD) WITH PROPOFOL
Anesthesia: General

## 2022-01-31 MED ORDER — PROPOFOL 10 MG/ML IV BOLUS
INTRAVENOUS | Status: DC | PRN
  Administered 2022-01-31: 20 mg via INTRAVENOUS
  Administered 2022-01-31: 30 mg via INTRAVENOUS
  Administered 2022-01-31: 70 mg via INTRAVENOUS

## 2022-01-31 MED ORDER — ALBUTEROL SULFATE HFA 108 (90 BASE) MCG/ACT IN AERS
INHALATION_SPRAY | RESPIRATORY_TRACT | Status: DC | PRN
  Administered 2022-01-31 (×2): 2 via RESPIRATORY_TRACT

## 2022-01-31 MED ORDER — ONDANSETRON HCL 4 MG/2ML IJ SOLN
INTRAMUSCULAR | Status: DC | PRN
  Administered 2022-01-31: 4 mg via INTRAVENOUS

## 2022-01-31 MED ORDER — ALBUTEROL SULFATE HFA 108 (90 BASE) MCG/ACT IN AERS
INHALATION_SPRAY | RESPIRATORY_TRACT | Status: AC
  Filled 2022-01-31: qty 6.7

## 2022-01-31 MED ORDER — SODIUM CHLORIDE 0.9 % IV SOLN
INTRAVENOUS | Status: DC
  Administered 2022-01-31: 1000 mL via INTRAVENOUS

## 2022-01-31 MED ORDER — SCOPOLAMINE 1 MG/3DAYS TD PT72
1.0000 | MEDICATED_PATCH | TRANSDERMAL | Status: DC
  Administered 2022-01-31: 1.5 mg via TRANSDERMAL

## 2022-01-31 MED ORDER — PROPOFOL 500 MG/50ML IV EMUL
INTRAVENOUS | Status: DC | PRN
  Administered 2022-01-31: 155 ug/kg/min via INTRAVENOUS

## 2022-01-31 MED ORDER — LIDOCAINE HCL (CARDIAC) PF 100 MG/5ML IV SOSY
PREFILLED_SYRINGE | INTRAVENOUS | Status: DC | PRN
  Administered 2022-01-31: 100 mg via INTRAVENOUS

## 2022-01-31 MED ORDER — SCOPOLAMINE 1 MG/3DAYS TD PT72
MEDICATED_PATCH | TRANSDERMAL | Status: AC
  Filled 2022-01-31: qty 1

## 2022-01-31 NOTE — Transfer of Care (Signed)
Immediate Anesthesia Transfer of Care Note ? ?Patient: Melinda Shaw ? ?Procedure(s) Performed: ESOPHAGOGASTRODUODENOSCOPY (EGD) WITH PROPOFOL ? ?Patient Location: Endoscopy Unit ? ?Anesthesia Type:General ? ?Level of Consciousness: drowsy and patient cooperative ? ?Airway & Oxygen Therapy: Patient Spontanous Breathing and Patient connected to face mask oxygen ? ?Post-op Assessment: Report given to RN and Post -op Vital signs reviewed and stable ? ?Post vital signs: Reviewed and stable ? ?Last Vitals:  ?Vitals Value Taken Time  ?BP 100/70 01/31/22 1025  ?Temp    ?Pulse 71 01/31/22 1029  ?Resp 18 01/31/22 1029  ?SpO2 100 % 01/31/22 1029  ?Vitals shown include unvalidated device data. ? ?Last Pain:  ?Vitals:  ? 01/31/22 1020  ?TempSrc: Temporal  ?PainSc:   ?   ? ?  ? ?Complications: No notable events documented. ?

## 2022-01-31 NOTE — H&P (Signed)
? ? ? ?Melinda Bellows, MD ?[address] [place], [place], [place], [address] ?[address] [place], [place], [place], [address] ?Phone: [phone]  ?Fax: [phone] ? ?Primary Care Physician:  Weyman Rodney, MD ? ? ?Pre-Procedure History & Physical: ?HPI:  Melinda Shaw is a 56 y.o. female is here for an endoscopy  ?  ?Past Medical History:  ?Diagnosis Date  ? Anemia   ? H/O  ? Anxiety   ? Bronchitis   ? Chest pain   ? a. 09/2018 Cath: Nl cors. EF 65%->Med rx (long-acting nitrate added).  ? Depression   ? Diastolic dysfunction   ? a. 10/2018 Echo: EF 55-60%, no rwma, Gr1DD. Mild MR. Nl RV fxn.  ? DOE (dyspnea on exertion)   ? Fibromyalgia   ? GERD (gastroesophageal reflux disease)   ? Headache   ? Hyperlipidemia   ? Hypertension   ? no meds  ? Palpitations   ? a. 01/2019 [facility]): NSR w/ rare PACs & PVCs.  ? PONV (postoperative nausea and vomiting)   ? ? ?Past Surgical History:  ?Procedure Laterality Date  ? ANTERIOR CRUCIATE LIGAMENT REPAIR    ? CARDIAC CATHETERIZATION    ? CHOLECYSTECTOMY    ? CORONARY ANGIOPLASTY    ? ESOPHAGOGASTRODUODENOSCOPY (EGD) WITH PROPOFOL N/A 08/28/2015  ? Procedure: ESOPHAGOGASTRODUODENOSCOPY (EGD) WITH PROPOFOL;  Surgeon: Josefine Class, MD;  Location: [facility] ENDOSCOPY;  Service: Endoscopy;  Laterality: N/A;  ? KNEE SURGERY    ? left knee   ? LEFT HEART CATH AND CORONARY ANGIOGRAPHY Left 09/22/2018  ? Procedure: LEFT HEART CATH AND CORONARY ANGIOGRAPHY;  Surgeon: Nelva Bush, MD;  Location: [facility] CV LAB;  Service: Cardiovascular;  Laterality: Left;  ? MASS EXCISION Left 01/26/2016  ? Procedure: EXCISION OF LEFT WRIST VOLAR MASS;  Surgeon: Charlotte Crumb, MD;  Location: [facility];  Service: Orthopedics;  Laterality: Left;  ? NOSE SURGERY    ? POLYPECTOMY    ? TUBAL LIGATION    ? VIDEO BRONCHOSCOPY N/A 05/29/2015  ? Procedure: VIDEO BRONCHOSCOPY WITHOUT FLUORO;  Surgeon: Vilinda Boehringer, MD;  Location: [facility] ORS;  Service: Cardiopulmonary;   Laterality: N/A;  ? ? ?Prior to Admission medications   ?Medication Sig Start Date End Date Taking? Authorizing Provider  ?diltiazem (CARDIZEM CD) 240 MG 24 hr capsule Take 1 capsule (240 mg total) by mouth daily. 11/22/21 02/20/22 Yes End, Harrell Gave, MD  ?diltiazem (CARDIZEM) 30 MG tablet Take 1 tablet (30 mg total) by mouth daily as needed (palpitations). 11/22/21  Yes End, Harrell Gave, MD  ?gabapentin (NEURONTIN) 400 MG capsule Take 400 mg by mouth 3 (three) times daily. 02/05/20  Yes [provider]  ?linaclotide Rolan Lipa) 290 MCG CAPS capsule Take 1 capsule (290 mcg total) by mouth daily before breakfast. 01/10/22  Yes Melinda Bellows, MD  ?lithium carbonate 300 MG capsule Take 600 mg by mouth at bedtime.   Yes [provider]  ?omeprazole (PRILOSEC) 40 MG capsule Take 40 mg by mouth 2 (two) times daily.    Yes [provider]  ?rosuvastatin (CRESTOR) 20 MG tablet Take 1 tablet (20 mg total) by mouth at bedtime. 11/22/21  Yes End, Harrell Gave, MD  ?tiZANidine (ZANAFLEX) 4 MG tablet Take 4 mg by mouth at bedtime.   Yes [provider]  ?topiramate (TOPAMAX) 50 MG tablet Taking 100 mg at night 09/12/20  Yes [provider]  ?zolpidem (AMBIEN) 10 MG tablet Take 10 mg by mouth at bedtime.   Yes [provider]  ?albuterol (PROVENTIL HFA;VENTOLIN HFA) 108 (90 BASE) MCG/ACT inhaler Inhale 2 puffs into the lungs every 6 (six) hours as needed for wheezing or shortness of breath.    [provider]  ?EPINEPHrine 0.3 mg/0.3 mL IJ SOAJ injection Inject 0.3 mg into the muscle as needed for anaphylaxis.    [provider]  ?hydrOXYzine (VISTARIL) 25 MG capsule Take 25 mg by mouth 2 (two) times daily. 01/26/22   [provider]  ?nitroGLYCERIN (NITROSTAT) 0.4 MG SL tablet PLACE ONE TABLET UNDER THE TONGUE EVERY 5 MINUTES AS NEEDED FOR CHEST PAIN. MAXIMUM OF 3 DOSES. 11/22/21   End, Harrell Gave, MD  ?ondansetron (ZOFRAN ODT) 4 MG disintegrating tablet Take 1  tablet (4 mg total) by mouth every 8 (eight) hours as needed for nausea or vomiting. 05/21/21   Blake Divine, MD  ? ? ?Allergies as of 01/28/2022 - Review Complete 01/28/2022  ?Allergen Reaction Noted  ? Cherry Swelling 02/22/2015  ? Shellfish allergy Shortness Of Breath 05/25/2015  ? Sulfa antibiotics Shortness Of Breath 02/22/2015  ? Bee venom  02/22/2015  ? Imdur [isosorbide nitrate]  12/31/2019  ? ? ?Family History  ?Problem Relation Age of Onset  ? COPD Mother   ? High Cholesterol Mother   ? Heart failure Mother   ? Hypertension Mother   ? Hypertension Father   ? AAA (abdominal aortic aneurysm) Sister   ? COPD Sister   ? High Cholesterol Sister   ? ? ?Social History  ? ?Socioeconomic History  ? Marital status: Married  ?  Spouse name: Not on file  ? Number of children: Not on file  ? Years of education: Not on file  ? Highest education level: Not on file  ?Occupational History  ? Not on file  ?Tobacco Use  ? Smoking status: Never  ? Smokeless tobacco: Never  ?Vaping Use  ? Vaping Use: Never used  ?Substance and Sexual Activity  ? Alcohol use: No  ?  Alcohol/week: 0.0 standard drinks  ? Drug use: No  ? Sexual activity: Yes  ?Other Topics Concern  ? Not on file  ?Social History Narrative  ? Not on file  ? ?Social Determinants of Health  ? ?Financial Resource Strain: Not on file  ?Food Insecurity: Not on file  ?Transportation Needs: Not on file  ?Physical Activity: Not on file  ?Stress: Not on file  ?Social Connections: Not on file  ?Intimate Partner Violence: Not on file  ? ? ?Review of Systems: ?See HPI, otherwise negative ROS ? ?Physical Exam: ?BP 129/76   Pulse 68   Temp (!) 96.6 ?F (35.9 ?C) (Temporal)   Resp 16   Ht '5\' 2"'$  (1.575 m)   Wt 58.6 kg   LMP 09/24/2016 (Approximate)   SpO2 100%   BMI 23.64 kg/m?  ?General:   Alert,  pleasant and cooperative in NAD ?Head:  Normocephalic and atraumatic. ?Neck:  Supple; no masses or thyromegaly. ?Lungs:  Clear throughout to auscultation, normal respiratory  effort.    ?Heart:  +S1, +S2, Regular rate and rhythm, No edema. ?Abdomen:  Soft, nontender and nondistended. Normal bowel sounds, without guarding, and without rebound.   ?Neurologic:  Alert and  oriented x4;  grossly normal neurologically. ? ?Impression/Plan: ?Melinda Shaw is here for an endoscopy  to be performed for  evaluation of esophageal varices ?   ?Risks, benefits, limitations, and alternatives regarding endoscopy have been reviewed with the patient.  Questions have been answered.  All parties agreeable. ? ? ?  Melinda Bellows, MD  01/31/2022, 10:03 AM ? ?

## 2022-01-31 NOTE — Anesthesia Procedure Notes (Signed)
Procedure Name: General with mask airway ?Date/Time: 01/31/2022 10:28 AM ?Performed by: Kelton Pillar, CRNA ?Pre-anesthesia Checklist: Patient identified, Emergency Drugs available, Suction available and Patient being monitored ?Patient Re-evaluated:Patient Re-evaluated prior to induction ?Oxygen Delivery Method: Simple face mask ?Induction Type: IV induction ?Placement Confirmation: positive ETCO2, CO2 detector and breath sounds checked- equal and bilateral ?Dental Injury: Teeth and Oropharynx as per pre-operative assessment  ? ? ? ? ?

## 2022-01-31 NOTE — Telephone Encounter (Signed)
Called patient to let her know the below information and she agreed on a liver biopsy. I told her that I would be calling her back with the date and time. ?

## 2022-01-31 NOTE — Anesthesia Postprocedure Evaluation (Signed)
Anesthesia Post Note ? ?Patient: Melinda Shaw ? ?Procedure(s) Performed: ESOPHAGOGASTRODUODENOSCOPY (EGD) WITH PROPOFOL ? ?Patient location during evaluation: Endoscopy ?Anesthesia Type: General ?Level of consciousness: awake and alert ?Pain management: pain level controlled ?Vital Signs Assessment: post-procedure vital signs reviewed and stable ?Respiratory status: spontaneous breathing, nonlabored ventilation, respiratory function stable and patient connected to nasal cannula oxygen ?Cardiovascular status: blood pressure returned to baseline and stable ?Postop Assessment: no apparent nausea or vomiting ?Anesthetic complications: no ? ? ?No notable events documented. ? ? ?Last Vitals:  ?Vitals:  ? 01/31/22 1040 01/31/22 1050  ?BP: 107/71 104/67  ?Pulse: 73 68  ?Resp: (!) 24 18  ?Temp:    ?SpO2: 100% 100%  ?  ?Last Pain:  ?Vitals:  ? 01/31/22 1020  ?TempSrc: Temporal  ?PainSc:   ? ? ?  ?  ?  ?  ?  ?  ? ?Arita Miss ? ? ? ? ?

## 2022-01-31 NOTE — Telephone Encounter (Signed)
-----   Message from [name], MD sent at 01/31/2022 12:49 PM EDT ----- ?Please inform patient that the smooth muscle antibody which was checked on 2 occasions is positive.  This sometimes can be seen in autoimmune liver disease.  It is unclear if she does or does not have autoimmune liver disease for sure and I would recommend a liver biopsy.  If she is willing to go ahead please schedule it if you want to discuss about it will need to see me at the office to discuss further and then schedule ?

## 2022-01-31 NOTE — Op Note (Signed)
[facility] ?Gastroenterology ?Patient Name: Melinda Shaw ?Procedure Date: 01/31/2022 10:13 AM ?MRN: [id number] ?Account #: [id number] ?Date of Birth: 04/18/1966 ?Admit Type: Outpatient ?Age: 56 ?Room: [facility] ENDO ROOM 3 ?Gender: Female ?Note Status: Finalized ?Instrument Name: Upper Endoscope [id number] ?Procedure:             Upper GI endoscopy ?Indications:           Cirrhosis rule out esophageal varices ?Providers:             [name] MD, MD ?Referring MD:          Forest Gleason Md, MD (Referring MD) ?Medicines:             Monitored Anesthesia Care ?Complications:         No immediate complications. ?Procedure:             Pre-Anesthesia Assessment: ?                       - Prior to the procedure, a History and Physical was  ?                       performed, and patient medications, allergies and  ?                       sensitivities were reviewed. The patient's tolerance  ?                       of previous anesthesia was reviewed. ?                       - The risks and benefits of the procedure and the  ?                       sedation options and risks were discussed with the  ?                       patient. All questions were answered and informed  ?                       consent was obtained. ?                       - After reviewing the risks and benefits, the patient  ?                       was deemed in satisfactory condition to undergo the  ?                       procedure. ?                       - ASA Grade Assessment: II - A patient with mild  ?                       systemic disease. ?                       After obtaining informed consent, the endoscope was  ?                       passed  under direct vision. Throughout the procedure,  ?                       the patient's blood pressure, pulse, and oxygen  ?                       saturations were monitored continuously. The Endoscope  ?                       was introduced through the mouth, and advanced to the  ?                        third part of duodenum. The upper GI endoscopy was  ?                       accomplished with ease. The patient tolerated the  ?                       procedure well. ?Findings: ?     The examined esophagus was normal. ?     The stomach was normal. ?     The examined duodenum was normal. ?     The cardia and gastric fundus were normal on retroflexion. ?Impression:            - Normal esophagus. ?                       - Normal stomach. ?                       - Normal examined duodenum. ?                       - No specimens collected. ?Recommendation:        - Discharge patient to home (with escort). ?                       - Resume previous diet. ?                       - Continue present medications. ?                       - Repeat upper endoscopy in 3 years for surveillance. ?Procedure Code(s):     --- Professional --- ?                       [phone], Esophagogastroduodenoscopy, flexible,  ?                       transoral; diagnostic, including collection of  ?                       specimen(s) by brushing or washing, when performed  ?                       (separate procedure) ?Diagnosis Code(s):     --- Professional --- ?                       K74.60, Unspecified cirrhosis of liver ?CPT copyright 2019 [employer] Medical [employer]. All rights reserved. ?The codes  documented in this report are preliminary and upon coder review may  ?be revised to meet current compliance requirements. ?[name], MD ?[name] MD, MD ?01/31/2022 10:23:41 AM ?This report has been signed electronically. ?Number of Addenda: 0 ?Note Initiated On: 01/31/2022 10:13 AM ?Estimated Blood Loss:  Estimated blood loss: none. ?     [facility] ?

## 2022-01-31 NOTE — Anesthesia Preprocedure Evaluation (Signed)
Anesthesia Evaluation  ?Patient identified by MRN, date of birth, ID band ?Patient awake ? ? ? ?Reviewed: ?Allergy & Precautions, NPO status , Patient's Chart, lab work & pertinent test results ? ?History of Anesthesia Complications ?(+) PONV and history of anesthetic complications ? ?Airway ?Mallampati: III ? ?TM Distance: >3 FB ?Neck ROM: Full ? ? ? Dental ?no notable dental hx. ?(+) Teeth Intact ?  ?Pulmonary ?neg pulmonary ROS, shortness of breath, neg sleep apnea, COPD,  COPD inhaler, Patient abstained from smoking.Not current smoker,  ?Almost daily inhaler use. Did not take today. Breathing feels fine today ?  ?Pulmonary exam normal ?breath sounds clear to auscultation ? ? ? ? ? ? Cardiovascular ?Exercise Tolerance: Good ?METShypertension, Pt. on medications ?+ DOE  ?(-) CAD and (-) Past MI Normal cardiovascular exam+ dysrhythmias Atrial Fibrillation  ?Rhythm:Regular Rate:Normal ? ? ?  ?Neuro/Psych ? Headaches, PSYCHIATRIC DISORDERS Anxiety Depression  Neuromuscular disease   ? GI/Hepatic ?GERD  Medicated and Controlled,(+) Cirrhosis  ?  ?  ? ,   ?Endo/Other  ?neg diabetesObesity ?Hyperlipidemia ? Renal/GU ?negative Renal ROS  ?negative genitourinary ?  ?Musculoskeletal ? ?(+) Fibromyalgia - ? Abdominal ?(+) + obese,   ?Peds ? Hematology ? ?(+) Blood dyscrasia, anemia ,   ?Anesthesia Other Findings ?Past Medical History: ?No date: Anemia ?    Comment:  H/O ?No date: Anxiety ?No date: Bronchitis ?No date: Chest pain ?    Comment:  a. 09/2018 Cath: Nl cors. EF 65%->Med rx (long-acting  ?             nitrate added). ?No date: Depression ?No date: Diastolic dysfunction ?    Comment:  a. 10/2018 Echo: EF 55-60%, no rwma, Gr1DD. Mild MR. Nl  ?             RV fxn. ?No date: DOE (dyspnea on exertion) ?No date: Fibromyalgia ?No date: GERD (gastroesophageal reflux disease) ?No date: Headache ?No date: Hyperlipidemia ?No date: Hypertension ?    Comment:  no meds ?No date: Palpitations ?     Comment:  a. 01/2019 [facility]): NSR w/ rare PACs & PVCs. ?No date: PONV (postoperative nausea and vomiting) ? Reproductive/Obstetrics ? ?  ? ? ? ? ? ? ? ? ? ? ? ? ? ?  ?  ? ? ? ? ? ? ? ? ?Anesthesia Physical ? ?Anesthesia Plan ? ?ASA: 3 ? ?Anesthesia Plan: General  ? ?Post-op Pain Management: Minimal or no pain anticipated  ? ?Induction: Intravenous ? ?PONV Risk Score and Plan: 4 or greater and Propofol infusion, TIVA, Ondansetron and Scopolamine patch - Pre-op ? ?Airway Management Planned: Natural Airway ? ?Additional Equipment: None ? ?Intra-op Plan:  ? ?Post-operative Plan:  ? ?Informed Consent: I have reviewed the patients History and Physical, chart, labs and discussed the procedure including the risks, benefits and alternatives for the proposed anesthesia with the patient or authorized representative who has indicated his/her understanding and acceptance.  ? ? ? ?Dental advisory given ? ?Plan Discussed with: CRNA, Anesthesiologist and Surgeon ? ?Anesthesia Plan Comments: (Discussed risks of anesthesia with patient, including possibility of difficulty with spontaneous ventilation under anesthesia necessitating airway intervention, PONV, and rare risks such as cardiac or respiratory or neurological events, and allergic reactions. Discussed the role of CRNA in patient's perioperative care. Patient understands.)  ? ? ? ? ? ? ?Anesthesia Quick Evaluation ? ?

## 2022-02-01 ENCOUNTER — Telehealth: Payer: Self-pay

## 2022-02-01 ENCOUNTER — Encounter: Payer: Self-pay | Admitting: Gastroenterology

## 2022-02-01 NOTE — Telephone Encounter (Signed)
Eden Lathe from specialty scheduling to make sure that they had received the liver biopsy for the patient and she stated that Pamala Hurry did and she would call us back once she had a date and time. ?

## 2022-02-04 NOTE — Progress Notes (Signed)
Patient on schedule for Liver Biopsy 02/07/2022, called and spoke with patient on phone with pre procedure instructions given. Made aware to be NPO after MN, driver post procedure/recovery/discharge, and be here at 0900, stated understanding. ?

## 2022-02-04 NOTE — Telephone Encounter (Signed)
Melinda Shaw called this morning with patient's liver biopsy appointment. I then called patient to let her know that she needs to show up one hour prior to appointment, she has to be fasting and bring a driver. Patient understood and had no further questions. ?

## 2022-02-06 ENCOUNTER — Other Ambulatory Visit: Payer: Self-pay | Admitting: Radiology

## 2022-02-06 NOTE — H&P (Signed)
? ?Chief Complaint: ?Patient was seen in consultation today for non-focal liver biopsy  ? ?Referring Physician(s): ?Anna,Kiran ? ?Supervising Physician: Daryll Brod ? ?Patient Status: [place] ? ?History of Present Illness: ?Melinda Shaw is a 56 y.o. female with a medical history significant for HTN, anxiety, fibromyalgia, COPD, appendiceal cancer s/p ileocecectomy and non-alcoholic fatty liver disease. She was referred to Gastroenterology February 2023 for abdominal pain, constipation and unintentional weight loss (50 lb in one year). MR liver and labs ordered. ? ?MR Liver 12/31/21 ?IMPRESSION: ?1. Cirrhotic hepatic morphology without evidence of portal ?hypertension. No suspicious hepatic lesions. ?2. Evaluation for hepatic fatty infiltration is limited by ?respiratory motion on in and out of phase imaging. However, there ?are subtle areas of signal loss on out of phase imaging along the ?falciform ligament and gallbladder fossa, corresponding with the ?hypodensity seen on prior CT, these areas do not demonstrate ?hyperenhancement, reduced diffusivity or abnormal T2 signal, and ?almost certainly reflect benign focal nodular fatty infiltration. ?3. Similar prominence of the biliary tree with the common duct ?measuring 6 mm in short axis favored reservoir effect post ?cholecystectomy. ?  ?Smooth muscle antibody and GGT levels are elevated. An EGD performed 01/31/22 showed no concerning/acute findings. Interventional Radiology has been asked to evaluate this patient for an image-guided non-focal liver biopsy for further evaluation.  ? ?Past Medical History:  ?Diagnosis Date  ? Anemia   ? H/O  ? Anxiety   ? Bronchitis   ? Chest pain   ? a. 09/2018 Cath: Nl cors. EF 65%->Med rx (long-acting nitrate added).  ? Depression   ? Diastolic dysfunction   ? a. 10/2018 Echo: EF 55-60%, no rwma, Gr1DD. Mild MR. Nl RV fxn.  ? DOE (dyspnea on exertion)   ? Fibromyalgia   ? GERD (gastroesophageal reflux disease)   ?  Headache   ? Hyperlipidemia   ? Hypertension   ? no meds  ? Palpitations   ? a. 01/2019 [facility]): NSR w/ rare PACs & PVCs.  ? PONV (postoperative nausea and vomiting)   ? ? ?Past Surgical History:  ?Procedure Laterality Date  ? ANTERIOR CRUCIATE LIGAMENT REPAIR    ? CARDIAC CATHETERIZATION    ? CHOLECYSTECTOMY    ? CORONARY ANGIOPLASTY    ? ESOPHAGOGASTRODUODENOSCOPY (EGD) WITH PROPOFOL N/A 08/28/2015  ? Procedure: ESOPHAGOGASTRODUODENOSCOPY (EGD) WITH PROPOFOL;  Surgeon: Josefine Class, MD;  Location: [facility] ENDOSCOPY;  Service: Endoscopy;  Laterality: N/A;  ? ESOPHAGOGASTRODUODENOSCOPY (EGD) WITH PROPOFOL N/A 01/31/2022  ? Procedure: ESOPHAGOGASTRODUODENOSCOPY (EGD) WITH PROPOFOL;  Surgeon: [name], MD;  Location: [facility] ENDOSCOPY;  Service: Gastroenterology;  Laterality: N/A;  ? KNEE SURGERY    ? left knee   ? LEFT HEART CATH AND CORONARY ANGIOGRAPHY Left 09/22/2018  ? Procedure: LEFT HEART CATH AND CORONARY ANGIOGRAPHY;  Surgeon: Nelva Bush, MD;  Location: [place] [facility];  Service: Cardiovascular;  Laterality: Left;  ? MASS EXCISION Left 01/26/2016  ? Procedure: EXCISION OF LEFT WRIST VOLAR MASS;  Surgeon: Charlotte Crumb, MD;  Location: [place];  Service: Orthopedics;  Laterality: Left;  ? NOSE SURGERY    ? POLYPECTOMY    ? TUBAL LIGATION    ? VIDEO BRONCHOSCOPY N/A 05/29/2015  ? Procedure: VIDEO BRONCHOSCOPY WITHOUT FLUORO;  Surgeon: Vilinda Boehringer, MD;  Location: [facility] ORS;  Service: Cardiopulmonary;  Laterality: N/A;  ? ? ?Allergies: ?Cherry, Shellfish allergy, Sulfa antibiotics, Bee venom, and Imdur [isosorbide nitrate] ? ?Medications: ?Prior to Admission medications   ?Medication Sig Start Date  End Date Taking? Authorizing Provider  ?albuterol (PROVENTIL HFA;VENTOLIN HFA) 108 (90 BASE) MCG/ACT inhaler Inhale 2 puffs into the lungs every 6 (six) hours as needed for wheezing or shortness of breath.    [provider]  ?diltiazem (CARDIZEM CD) 240 MG 24 hr capsule Take  1 capsule (240 mg total) by mouth daily. 11/22/21 02/20/22  End, Harrell Gave, MD  ?diltiazem (CARDIZEM) 30 MG tablet Take 1 tablet (30 mg total) by mouth daily as needed (palpitations). 11/22/21   End, Harrell Gave, MD  ?EPINEPHrine 0.3 mg/0.3 mL IJ SOAJ injection Inject 0.3 mg into the muscle as needed for anaphylaxis.    [provider]  ?gabapentin (NEURONTIN) 400 MG capsule Take 400 mg by mouth 3 (three) times daily. 02/05/20   [provider]  ?hydrOXYzine (VISTARIL) 25 MG capsule Take 25 mg by mouth 2 (two) times daily. 01/26/22   [provider]  ?linaclotide Rolan Lipa) 290 MCG CAPS capsule Take 1 capsule (290 mcg total) by mouth daily before breakfast. 01/10/22   [name], MD  ?lithium carbonate 300 MG capsule Take 600 mg by mouth at bedtime.    [provider]  ?nitroGLYCERIN (NITROSTAT) 0.4 MG SL tablet PLACE ONE TABLET UNDER THE TONGUE EVERY 5 MINUTES AS NEEDED FOR CHEST PAIN. MAXIMUM OF 3 DOSES. 11/22/21   End, Harrell Gave, MD  ?omeprazole (PRILOSEC) 40 MG capsule Take 40 mg by mouth 2 (two) times daily.     [provider]  ?ondansetron (ZOFRAN ODT) 4 MG disintegrating tablet Take 1 tablet (4 mg total) by mouth every 8 (eight) hours as needed for nausea or vomiting. 05/21/21   Blake Divine, MD  ?rosuvastatin (CRESTOR) 20 MG tablet Take 1 tablet (20 mg total) by mouth at bedtime. 11/22/21   End, Harrell Gave, MD  ?tiZANidine (ZANAFLEX) 4 MG tablet Take 4 mg by mouth at bedtime.    [provider]  ?topiramate (TOPAMAX) 50 MG tablet Taking 100 mg at night 09/12/20   [provider]  ?zolpidem (AMBIEN) 10 MG tablet Take 10 mg by mouth at bedtime.    [provider]  ?  ? ?Family History  ?Problem Relation Age of Onset  ? COPD Mother   ? High Cholesterol Mother   ? Heart failure Mother   ? Hypertension Mother   ? Hypertension Father   ? AAA (abdominal aortic aneurysm) Sister   ? COPD Sister   ? High Cholesterol Sister   ? ? ?Social History   ? ?Socioeconomic History  ? Marital status: Married  ?  Spouse name: Not on file  ? Number of children: Not on file  ? Years of education: Not on file  ? Highest education level: Not on file  ?Occupational History  ? Not on file  ?Tobacco Use  ? Smoking status: Never  ? Smokeless tobacco: Never  ?Vaping Use  ? Vaping Use: Never used  ?Substance and Sexual Activity  ? Alcohol use: No  ?  Alcohol/week: 0.0 standard drinks  ? Drug use: No  ? Sexual activity: Yes  ?Other Topics Concern  ? Not on file  ?Social History Narrative  ? Not on file  ? ?Social Determinants of Health  ? ?Financial Resource Strain: Not on file  ?Food Insecurity: Not on file  ?Transportation Needs: Not on file  ?Physical Activity: Not on file  ?Stress: Not on file  ?Social Connections: Not on file  ? ? ?Review of Systems: A 12 point ROS discussed and pertinent positives are indicated in the  HPI above.  All other systems are negative. ? ?Review of Systems  ?Constitutional:  Positive for unexpected weight change. Negative for appetite change and fatigue.  ?Respiratory:  Negative for cough and shortness of breath.   ?Cardiovascular:  Negative for chest pain and leg swelling.  ?Gastrointestinal:  Positive for abdominal pain and constipation. Negative for diarrhea, nausea and vomiting.  ?Neurological:  Negative for dizziness and headaches.  ? ?Vital Signs: ?BP 129/73   Pulse (!) 50   Temp 97.8 ?F (36.6 ?C) (Oral)   Resp 17   Ht '5\' 2"'$  (1.575 m)   Wt 129 lb (58.5 kg)   LMP 09/24/2016 (Approximate)   SpO2 100%   BMI 23.59 kg/m?  ? ?Physical Exam ?Constitutional:   ?   General: She is not in acute distress. ?   Appearance: She is not ill-appearing.  ?HENT:  ?   Mouth/Throat:  ?   Mouth: Mucous membranes are moist.  ?   Pharynx: Oropharynx is clear.  ?Cardiovascular:  ?   Rate and Rhythm: Regular rhythm. Bradycardia present.  ?   Pulses: Normal pulses.  ?   Heart sounds: Normal heart sounds.  ?Pulmonary:  ?   Effort: Pulmonary effort is normal.  ?    Breath sounds: Normal breath sounds.  ?Abdominal:  ?   General: Bowel sounds are normal.  ?   Palpations: Abdomen is soft.  ?   Tenderness: There is abdominal tenderness.  ?Skin: ?   General: Skin is warm and dry

## 2022-02-07 ENCOUNTER — Ambulatory Visit
Admission: RE | Admit: 2022-02-07 | Discharge: 2022-02-07 | Disposition: A | Discharge status: Home / Self Care | Payer: PPO | Source: Ambulatory Visit | Attending: Gastroenterology | Admitting: Gastroenterology

## 2022-02-07 ENCOUNTER — Other Ambulatory Visit: Payer: Self-pay

## 2022-02-07 DIAGNOSIS — R109 Unspecified abdominal pain: Secondary | ICD-10-CM | POA: Diagnosis present

## 2022-02-07 DIAGNOSIS — J449 Chronic obstructive pulmonary disease, unspecified: Secondary | ICD-10-CM | POA: Diagnosis not present

## 2022-02-07 DIAGNOSIS — F419 Anxiety disorder, unspecified: Secondary | ICD-10-CM | POA: Insufficient documentation

## 2022-02-07 DIAGNOSIS — K7400 Hepatic fibrosis, unspecified: Secondary | ICD-10-CM | POA: Diagnosis not present

## 2022-02-07 DIAGNOSIS — K7460 Unspecified cirrhosis of liver: Secondary | ICD-10-CM

## 2022-02-07 DIAGNOSIS — I10 Essential (primary) hypertension: Secondary | ICD-10-CM | POA: Diagnosis not present

## 2022-02-07 DIAGNOSIS — M797 Fibromyalgia: Secondary | ICD-10-CM | POA: Diagnosis not present

## 2022-02-07 DIAGNOSIS — R634 Abnormal weight loss: Secondary | ICD-10-CM | POA: Diagnosis present

## 2022-02-07 DIAGNOSIS — Z9049 Acquired absence of other specified parts of digestive tract: Secondary | ICD-10-CM | POA: Diagnosis not present

## 2022-02-07 DIAGNOSIS — K760 Fatty (change of) liver, not elsewhere classified: Secondary | ICD-10-CM | POA: Insufficient documentation

## 2022-02-07 LAB — CBC
HCT: 42.4 % (ref 36.0–46.0)
Hemoglobin: 13.8 g/dL (ref 12.0–15.0)
MCH: 31.4 pg (ref 26.0–34.0)
MCHC: 32.5 g/dL (ref 30.0–36.0)
MCV: 96.6 fL (ref 80.0–100.0)
Platelets: 240 10*3/uL (ref 150–400)
RBC: 4.39 MIL/uL (ref 3.87–5.11)
RDW: 12.3 % (ref 11.5–15.5)
WBC: 5.6 10*3/uL (ref 4.0–10.5)
nRBC: 0 % (ref 0.0–0.2)

## 2022-02-07 LAB — PROTIME-INR
INR: 1 (ref 0.8–1.2)
Prothrombin Time: 13 seconds (ref 11.4–15.2)

## 2022-02-07 MED ORDER — MIDAZOLAM HCL 2 MG/2ML IJ SOLN
INTRAMUSCULAR | Status: AC | PRN
  Administered 2022-02-07: 1 mg via INTRAVENOUS

## 2022-02-07 MED ORDER — SCOPOLAMINE 1 MG/3DAYS TD PT72
1.0000 | MEDICATED_PATCH | Freq: Once | TRANSDERMAL | Status: DC
  Administered 2022-02-07: 1.5 mg via TRANSDERMAL
  Filled 2022-02-07: qty 1

## 2022-02-07 MED ORDER — MIDAZOLAM HCL 2 MG/2ML IJ SOLN
INTRAMUSCULAR | Status: AC
  Filled 2022-02-07: qty 4

## 2022-02-07 MED ORDER — SODIUM CHLORIDE 0.9 % IV SOLN: INTRAVENOUS | Status: DC

## 2022-02-07 MED ORDER — FENTANYL CITRATE (PF) 100 MCG/2ML IJ SOLN
INTRAMUSCULAR | Status: AC
Start: 2022-02-07 — End: 2022-02-07
  Filled 2022-02-07: qty 2

## 2022-02-07 MED ORDER — FENTANYL CITRATE (PF) 100 MCG/2ML IJ SOLN
INTRAMUSCULAR | Status: AC | PRN
  Administered 2022-02-07: 50 ug via INTRAVENOUS

## 2022-02-07 NOTE — Procedures (Signed)
Interventional Radiology Procedure Note  Procedure: US RANDOM RT LIVER CORE BX    Complications: None  Estimated Blood Loss:  MIN  Findings: 18 G CORE X 2    M. TREVOR Shona Pardo, MD    

## 2022-02-08 ENCOUNTER — Ambulatory Visit
Admission: RE | Admit: 2022-02-08 | Discharge: 2022-02-08 | Disposition: A | Discharge status: Home / Self Care | Payer: PPO | Source: Ambulatory Visit | Attending: Gastroenterology | Admitting: Gastroenterology

## 2022-02-08 DIAGNOSIS — R634 Abnormal weight loss: Secondary | ICD-10-CM

## 2022-02-08 DIAGNOSIS — K7400 Hepatic fibrosis, unspecified: Secondary | ICD-10-CM | POA: Diagnosis not present

## 2022-02-08 MED ORDER — IOHEXOL 300 MG/ML  SOLN
75.0000 mL | Freq: Once | INTRAMUSCULAR | Status: AC | PRN
  Administered 2022-02-08: 75 mL via INTRAVENOUS

## 2022-02-11 LAB — SURGICAL PATHOLOGY

## 2022-02-11 NOTE — Progress Notes (Signed)
Inform patient that biopsy does not show any fat in the liver, does not show any cirrhosis.  Would recommend MRCP and then I would like to see her in the office to discuss how we can proceed

## 2022-02-12 ENCOUNTER — Encounter: Payer: Self-pay | Admitting: Gastroenterology

## 2022-02-13 ENCOUNTER — Other Ambulatory Visit: Payer: Self-pay

## 2022-02-13 DIAGNOSIS — K7460 Unspecified cirrhosis of liver: Secondary | ICD-10-CM

## 2022-02-13 DIAGNOSIS — K769 Liver disease, unspecified: Secondary | ICD-10-CM

## 2022-02-19 ENCOUNTER — Encounter: Payer: Self-pay | Admitting: Gastroenterology

## 2022-02-19 NOTE — Telephone Encounter (Signed)
Suggest Alprazolam Oral, 0.5 mg given once-  60 minutes before procedure . Ensure she has someone to drive her home after procedure ?

## 2022-02-22 ENCOUNTER — Other Ambulatory Visit: Payer: Self-pay | Admitting: Internal Medicine

## 2022-02-22 MED ORDER — ALPRAZOLAM 0.5 MG PO TABS: ORAL_TABLET | ORAL | 0 refills | Status: DC

## 2022-02-22 NOTE — Addendum Note (Signed)
Addended by: Lurlean Nanny on: 02/22/2022 01:40 PM ? ? Modules accepted: Orders ? ?

## 2022-02-25 ENCOUNTER — Ambulatory Visit
Admission: RE | Admit: 2022-02-25 | Discharge: 2022-02-25 | Disposition: A | Discharge status: Home / Self Care | Payer: PPO | Source: Ambulatory Visit | Attending: Gastroenterology | Admitting: Gastroenterology

## 2022-02-25 ENCOUNTER — Other Ambulatory Visit: Payer: Self-pay | Admitting: Gastroenterology

## 2022-02-25 DIAGNOSIS — K744 Secondary biliary cirrhosis: Secondary | ICD-10-CM

## 2022-02-25 DIAGNOSIS — K7460 Unspecified cirrhosis of liver: Secondary | ICD-10-CM | POA: Diagnosis present

## 2022-02-25 DIAGNOSIS — K769 Liver disease, unspecified: Secondary | ICD-10-CM | POA: Diagnosis present

## 2022-02-25 MED ORDER — GADOBUTROL 1 MMOL/ML IV SOLN
5.0000 mL | Freq: Once | INTRAVENOUS | Status: AC | PRN
  Administered 2022-02-25: 5 mL via INTRAVENOUS

## 2022-02-26 ENCOUNTER — Ambulatory Visit (INDEPENDENT_AMBULATORY_CARE_PROVIDER_SITE_OTHER): Payer: PPO | Admitting: Gastroenterology

## 2022-02-26 ENCOUNTER — Encounter: Payer: Self-pay | Admitting: Gastroenterology

## 2022-02-26 ENCOUNTER — Encounter: Payer: Self-pay | Admitting: Internal Medicine

## 2022-02-26 VITALS — BP 137/71 | HR 58 | Temp 97.9°F | Wt 134.0 lb

## 2022-02-26 DIAGNOSIS — R897 Abnormal histological findings in specimens from other organs, systems and tissues: Secondary | ICD-10-CM | POA: Diagnosis not present

## 2022-02-26 DIAGNOSIS — R748 Abnormal levels of other serum enzymes: Secondary | ICD-10-CM

## 2022-02-26 NOTE — Patient Instructions (Addendum)
We will get in contact with Dr. Marene Lenz to let him know that he needs to see you again for a second opinion since we will send him your liver biopsy pathology. They will call you to schedule an appointment. ? ?Dr. Vicente Males wants you to make sure you ask the following at your [facility] appointment: ? ?Ask about abnormal liver biopsy. ? ?Ask about positive smooth muscle antibody. ? ?What are the next steps.  ?

## 2022-02-26 NOTE — Progress Notes (Signed)
No abnormalities seen on mri

## 2022-02-26 NOTE — Progress Notes (Signed)
?  ?[name] MD, MRCP(U.K) ?[place]  ?[address]  ?[place],  [address]  ?Main: [phone]  ?Fax: [phone] ? ? ?Primary Care Physician: Pcp, No ? ?Primary Gastroenterologist:  Dr. [name]  ? ?Chief Complaint  ?Patient presents with  ? Cirrhosis  ? ? ?HPI: Melinda Shaw is a 56 y.o. female ? ?Summary of history : ?  ?Initially referred and seen in February 2023 for abdominal pain.  In July 2022 had a CT scan of the abdomen that showed gas and fluid levels throughout the colon suggestive for diarrheal state.  Incidental findings of cirrhosis of the liver without portal hypertension. ?  ?11/15/2021 x-ray abdomen showed nonobstructive bowel gas pattern. ?05/21/2021: Hemoglobin 14.2 g ?01/29/2021 hepatitis C antibody not detected, nonreactive hepatitis a IgM, hepatitis B core antibody nonreactive hepatitis B surface antibody positive hepatitis B surface antigen nonreactive. ?  ?Previously seen by [facility] hepatology in March 0355 of nonalcoholic fatty liver disease and abdominal pain.  She was diagnosed with metabolic syndrome and noted to have mildly elevated LFTs at that point of time there is concern for significant weight loss and early satiety history of appendicular mucinous neoplasm ,   She underwent an upper endoscopy on 03/16/2021 which showed normal esophagus erythematous mucosa in the stomach and duodenum that was biopsied repeat endoscopy in 3 years was recommended.  She also underwent a colonoscopy on the same day that showed prior end-to-side ileocolonic anastomosis in the ascending colon diverticulosis and 2 subcentimeter polyps were resected.  Biopsies were taken throughout the entire colon for histology.  It showed chronic inactive gastritis and normal biopsies of the colon and the polyp was a tubular adenoma. ?  ?No recent LFTs of CMP. ?  ?She has lost over 50 pounds of weight over the past 1 year.  Unintentional.   Takes MiraLAX daily does not have a bowel movement for 8 days up to a  week.  Significant abdominal discomfort generalized which is usually better after good bowel movement.  Never smoked has been diagnosed with COPD.  No excess alcohol consumption.  No family history of liver disease.  She is very concerned about her unintentional weight loss.  She was told at [facility] by Dr. Ahmed Prima that she does not have cirrhosis rather has nonalcoholic fatty liver disease. ?  ?12/19/2021 H. pylori breath test, viral hepatitis work-up was negative.  Immune to hepatitis a and B.  Smooth muscle antibody was positive at 65 hemoglobin 13.5 g with a platelet count of 253 CMP showed elevated alkaline phosphatase at 167 but AST and ALT were in the normal range.  ? ?01/01/2022 MRI of the liver shows cirrhotic morphology without evidence of portal hypertension.  Benign focal nodular fatty infiltration noted.  Status post cholecystectomy. ? ?Interval history 01/28/2022-02/26/2022 ? ?01/28/2022: Anti-SLA antibody normal, smooth muscle antibody elevated at 51,, GT elevated at 100.  PTH normal calcium 10.8.  INR 1.0 ? ?02/07/2022: Liver biopsy: ?FOCAL PORTAL INFLAMMATION, BILE DUCTULAR REACTION, AND A MINORITY OF  ?BILE DUCTS WITH MILD PERIDUCTAL FIBROSIS.  ?- SEE COMMENT.  ? ?Comment:  ?Review of CHL demonstrates mild elevation of GGT with persistently  ?elevated alkaline phosphatase, and intermittent mild elevation of ALT.  ?Her total protein was mildly elevated in July 2022. AST and total  ?bilirubin have been unremarkable. She has a positive smooth muscle  ?antibody. ANA, anti-SLA, LKM1, AMA, and hepatitis B/C serologies are  ?negative. Ceruloplasmin and alpha-1 antitrypsin are within normal  ?limits.  ? ?Sections  demonstrate two intact cores of hepatic parenchyma with  ?adequate number of portal tracts present for evaluation.  ?There is minimal macrovesicular steatosis (less than 5%).  A minority of  ?the portal tracts contain a moderately dense mixed inflammatory  ?infiltrate composed of lymphocytes, plasma cells,  and rare eosinophils.  ?Focal bile ductular reaction and minimal piecemeal necrosis/interface  ?hepatitis are identified. Two portal tracts demonstrate mild periductal  ?fibrosis with associated plasma cells. Cholestasis, florid duct lesions,  ?granulomatous inflammation, and ductopenia are not identified.  ?The trichrome stain highlights focal mild fibrous portal expansion.  ?Cirrhosis is not identified; trichrome stain confirms.  Minimal and very  ?focal  intrahepatic stainable iron is present. Intrahepatocyte PASD  ?globules are not identified. Reticulin stain highlights intact hepatic  ?architecture. Stain controls worked appropriately.  ?The histologic findings suggest an underlying biliary process. The  ?differential diagnosis includes early primary biliary  ?cholangitis/cirrhosis and primary/secondary sclerosing cholangitis. MRCP  ?may be helpful for further evaluation. Features of autoimmune hepatitis  ?and cirrhosis are not identified in this sample.  ? ? ?02/25/2022 MRCP shows no abnormalities. ?  ?12/31/2021: MELD score 6 ?Denies any alcohol use.  Denies any other complaints. ? ? ? ? ? ?Current Outpatient Medications  ?Medication Sig Dispense Refill  ? albuterol (PROVENTIL HFA;VENTOLIN HFA) 108 (90 BASE) MCG/ACT inhaler Inhale 2 puffs into the lungs every 6 (six) hours as needed for wheezing or shortness of breath.    ? ALPRAZolam (XANAX) 0.5 MG tablet take 1 tablet 1 hour prior to MRI 1 tablet 0  ? diltiazem (CARDIZEM CD) 240 MG 24 hr capsule TAKE ONE CAPSULE BY MOUTH DAILY 90 capsule 0  ? diltiazem (CARDIZEM) 30 MG tablet Take 1 tablet (30 mg total) by mouth daily as needed (palpitations). 90 tablet 0  ? EPINEPHrine 0.3 mg/0.3 mL IJ SOAJ injection Inject 0.3 mg into the muscle as needed for anaphylaxis.    ? gabapentin (NEURONTIN) 400 MG capsule Take 400 mg by mouth 3 (three) times daily.    ? hydrOXYzine (VISTARIL) 25 MG capsule Take 25 mg by mouth 2 (two) times daily.    ? linaclotide (LINZESS) 290 MCG  CAPS capsule Take 1 capsule (290 mcg total) by mouth daily before breakfast. 30 capsule 3  ? lithium carbonate 300 MG capsule Take 600 mg by mouth at bedtime.    ? nitroGLYCERIN (NITROSTAT) 0.4 MG SL tablet PLACE ONE TABLET UNDER THE TONGUE EVERY 5 MINUTES AS NEEDED FOR CHEST PAIN. MAXIMUM OF 3 DOSES. 25 tablet 0  ? omeprazole (PRILOSEC) 40 MG capsule Take 40 mg by mouth 2 (two) times daily.     ? ondansetron (ZOFRAN ODT) 4 MG disintegrating tablet Take 1 tablet (4 mg total) by mouth every 8 (eight) hours as needed for nausea or vomiting. 12 tablet 0  ? rosuvastatin (CRESTOR) 20 MG tablet Take 1 tablet (20 mg total) by mouth at bedtime. 90 tablet 0  ? tiZANidine (ZANAFLEX) 4 MG tablet Take 4 mg by mouth at bedtime.    ? topiramate (TOPAMAX) 50 MG tablet Taking 100 mg at night    ? zolpidem (AMBIEN) 10 MG tablet Take 10 mg by mouth at bedtime.    ? ?No current facility-administered medications for this visit.  ? ? ?Allergies as of 02/26/2022 - Review Complete 02/26/2022  ?Allergen Reaction Noted  ? Cherry Swelling 02/22/2015  ? Shellfish allergy Shortness Of Breath 05/25/2015  ? Sulfa antibiotics Shortness Of Breath 02/22/2015  ? Bee venom  02/22/2015  ? Imdur [  isosorbide nitrate]  12/31/2019  ? ? ?ROS: ? ?General: Negative for anorexia, weight loss, fever, chills, fatigue, weakness. ?ENT: Negative for hoarseness, difficulty swallowing , nasal congestion. ?CV: Negative for chest pain, angina, palpitations, dyspnea on exertion, peripheral edema.  ?Respiratory: Negative for dyspnea at rest, dyspnea on exertion, cough, sputum, wheezing.  ?GI: See history of present illness. ?GU:  Negative for dysuria, hematuria, urinary incontinence, urinary frequency, nocturnal urination.  ?Endo: Negative for unusual weight change.  ?  ?Physical Examination: ? ? BP 137/71   Pulse (!) 58   Temp 97.9 ?F (36.6 ?C) (Oral)   Wt 134 lb (60.8 kg)   LMP 09/24/2016 (Approximate)   BMI 24.51 kg/m?  ? ?General: Well-nourished, well-developed  in no acute distress.  ?Eyes: No icterus. Conjunctivae pink. ?Neuro: Alert and oriented x 3.  Grossly intact. ?Skin: Warm and dry, no jaundice.   ?Psych: Alert and cooperative, normal mood and affect. ? ? ?

## 2022-02-26 NOTE — Telephone Encounter (Signed)
Please see mychart message below from patient needing to reschedule her appt.  ? ?

## 2022-03-01 ENCOUNTER — Ambulatory Visit: Payer: PPO | Admitting: Internal Medicine

## 2022-03-06 ENCOUNTER — Encounter: Payer: Self-pay | Admitting: Gastroenterology

## 2022-03-11 ENCOUNTER — Encounter: Payer: Self-pay | Admitting: Gastroenterology

## 2022-03-17 ENCOUNTER — Encounter: Payer: Self-pay | Admitting: Gastroenterology

## 2022-03-19 ENCOUNTER — Ambulatory Visit
Admission: RE | Admit: 2022-03-19 | Discharge: 2022-03-19 | Disposition: A | Discharge status: Home / Self Care | Payer: PPO | Source: Ambulatory Visit | Attending: Family Medicine | Admitting: Family Medicine

## 2022-03-19 DIAGNOSIS — Z1231 Encounter for screening mammogram for malignant neoplasm of breast: Secondary | ICD-10-CM | POA: Insufficient documentation

## 2022-03-22 ENCOUNTER — Encounter: Payer: Self-pay | Admitting: Nurse Practitioner

## 2022-03-22 ENCOUNTER — Ambulatory Visit (INDEPENDENT_AMBULATORY_CARE_PROVIDER_SITE_OTHER): Payer: PPO | Admitting: Nurse Practitioner

## 2022-03-22 VITALS — BP 122/62 | HR 51 | Ht 62.0 in | Wt 132.0 lb

## 2022-03-22 DIAGNOSIS — I10 Essential (primary) hypertension: Secondary | ICD-10-CM

## 2022-03-22 DIAGNOSIS — E782 Mixed hyperlipidemia: Secondary | ICD-10-CM | POA: Diagnosis not present

## 2022-03-22 DIAGNOSIS — R002 Palpitations: Secondary | ICD-10-CM

## 2022-03-22 DIAGNOSIS — R072 Precordial pain: Secondary | ICD-10-CM

## 2022-03-22 NOTE — Patient Instructions (Signed)
Medication Instructions:  ? ?Your physician recommends that you continue on your current medications as directed. Please refer to the Current Medication list given to you today. ? ?*If you need a refill on your cardiac medications before your next appointment, please call your pharmacy* ? ? ?Lab Work: ? ?None Ordered ? ?If you have labs (blood work) drawn today and your tests are completely normal, you will receive your results only by: ?MyChart Message (if you have MyChart) OR ?A paper copy in the mail ?If you have any lab test that is abnormal or we need to change your treatment, we will call you to review the results. ? ? ?Testing/Procedures: ? ?None Ordered ? ? ?Follow-Up: ?At [facility], you and your health needs are our priority.  As part of our continuing mission to provide you with exceptional heart care, we have created designated Provider Care Teams.  These Care Teams include your primary Cardiologist (physician) and Advanced Practice Providers (APPs -  Physician Assistants and Nurse Practitioners) who all work together to provide you with the care you need, when you need it. ? ?We recommend signing up for the patient portal called "MyChart".  Sign up information is provided on this After Visit Summary.  MyChart is used to connect with patients for Virtual Visits (Telemedicine).  Patients are able to view lab/test results, encounter notes, upcoming appointments, etc.  Non-urgent messages can be sent to your provider as well.   ?To learn more about what you can do with MyChart, go to [email or web address].   ? ?Your next appointment:   ?6 month(s) ? ?The format for your next appointment:   ?In Person ? ?Provider:   ?Nelva Bush, MD or Murray Hodgkins, NP ? ? ?Important Information About Sugar ? ? ? ? ? ? ?

## 2022-03-22 NOTE — Progress Notes (Signed)
? ? ?Office Visit  ?  ?Patient Name: Melinda Shaw ?Date of Encounter: 03/22/2022 ? ?Primary Care Provider:  Pcp, No ?Primary Cardiologist:  Nelva Bush, MD ? ?Chief Complaint  ?  ?56 year old female with a history of chest pain with normal coronary arteries, dyspnea exertion, fibromyalgia, hypertension, hyperlipidemia, GERD, and bronchitis, who presents for follow-up related to chest pain. ? ?Past Medical History  ?  ?Past Medical History:  ?Diagnosis Date  ? Anemia   ? H/O  ? Anxiety   ? Bronchitis   ? Chest pain   ? a. 09/2018 Cath: Nl cors. EF 65%->Med rx (long-acting nitrate added).  ? Depression   ? Diastolic dysfunction   ? a. 10/2018 Echo: EF 55-60%, no rwma, Gr1DD. Mild MR. Nl RV fxn.  ? DOE (dyspnea on exertion)   ? Fibromyalgia   ? GERD (gastroesophageal reflux disease)   ? Headache   ? Hyperlipidemia   ? Hypertension   ? no meds  ? Palpitations   ? a. 01/2019 [facility]): NSR w/ rare PACs & PVCs; b. 04/2020 [facility]: Predominantly sinus rhythm @ 75 (48-141). Rare PACs/PVCs. No significant arrhythmias or prolonged pauses.  No triggered events.  ? PONV (postoperative nausea and vomiting)   ? ?Past Surgical History:  ?Procedure Laterality Date  ? ANTERIOR CRUCIATE LIGAMENT REPAIR    ? CARDIAC CATHETERIZATION    ? CHOLECYSTECTOMY    ? CORONARY ANGIOPLASTY    ? ESOPHAGOGASTRODUODENOSCOPY (EGD) WITH PROPOFOL N/A 08/28/2015  ? Procedure: ESOPHAGOGASTRODUODENOSCOPY (EGD) WITH PROPOFOL;  Surgeon: Josefine Class, MD;  Location: [facility] ENDOSCOPY;  Service: Endoscopy;  Laterality: N/A;  ? ESOPHAGOGASTRODUODENOSCOPY (EGD) WITH PROPOFOL N/A 01/31/2022  ? Procedure: ESOPHAGOGASTRODUODENOSCOPY (EGD) WITH PROPOFOL;  Surgeon: [name], MD;  Location: [facility] ENDOSCOPY;  Service: Gastroenterology;  Laterality: N/A;  ? KNEE SURGERY    ? left knee   ? LEFT HEART CATH AND CORONARY ANGIOGRAPHY Left 09/22/2018  ? Procedure: LEFT HEART CATH AND CORONARY ANGIOGRAPHY;  Surgeon: Nelva Bush, MD;  Location: [place] CV  LAB;  Service: Cardiovascular;  Laterality: Left;  ? MASS EXCISION Left 01/26/2016  ? Procedure: EXCISION OF LEFT WRIST VOLAR MASS;  Surgeon: Charlotte Crumb, MD;  Location: [place];  Service: Orthopedics;  Laterality: Left;  ? NOSE SURGERY    ? POLYPECTOMY    ? TUBAL LIGATION    ? VIDEO BRONCHOSCOPY N/A 05/29/2015  ? Procedure: VIDEO BRONCHOSCOPY WITHOUT FLUORO;  Surgeon: Vilinda Boehringer, MD;  Location: [facility] ORS;  Service: Cardiopulmonary;  Laterality: N/A;  ? ? ?Allergies ? ?Allergies  ?Allergen Reactions  ? Cherry Swelling  ?  Swelling of lips and throat  ? Shellfish Allergy Shortness Of Breath  ? Sulfa Antibiotics Shortness Of Breath  ?  Stopped breathing, Swelling of throat  ? Bee Venom   ?  Swelling of throat  ? Imdur [Isosorbide Nitrate]   ?  Worsening headaches ?  ? ? ?History of Present Illness  ?  ?56 year old female with above past medical history including exertional chest pain and dyspnea, hypertension, hyperlipidemia, migraines, GERD, fibromyalgia, and bronchitis.  She previous underwent diagnostic catheterization November 2019, which showed normal coronary arteries and normal LV function.  There was concern for possible coronary vasospasm and she was placed on long-acting nitrate therapy.  Echocardiogram in December 2019 showed normal LV function, grade 1 diastolic dysfunction, and mild mitral regurgitation.  She subsequently underwent event monitoring in March 2020 and again in June 2021, both times in the setting of palpitations, with  most recent showing rare PACs and PVCs without sustained arrhythmias, pauses, or triggered events.  She has been managed with oral diltiazem with less frequent episodes of chest pain and palpitations. ? ?Melinda Shaw was last seen in cardiology clinic in January 2023, at which time she reported episodic chest pain and palpitations 2-3 times per month lasting a few seconds and resolving spontaneously.  Diltiazem CD was increased to 240 mg daily with  recommendation to use short acting diltiazem for breakthrough symptoms.  Since her last visit, she has done reasonably well.  She occasionally notes brief palpitations, may be 1-2 times per week, for which she will take a as needed diltiazem 30 mg.  Sometimes palpitations associated with mild chest discomfort however, she feels as though the as needed diltiazem helps to alleviate symptoms and overall frequency of symptoms are stable to improved.  She recently has been having some chest tenderness after a trailer gate struck her chest.  She had been using Tylenol but symptoms have since improved significantly.  She denies dyspnea, PND, orthopnea, dizziness, syncope, edema, or early satiety. ? ?Home Medications  ?  ?Current Outpatient Medications  ?Medication Sig Dispense Refill  ? albuterol (PROVENTIL HFA;VENTOLIN HFA) 108 (90 BASE) MCG/ACT inhaler Inhale 2 puffs into the lungs every 6 (six) hours as needed for wheezing or shortness of breath.    ? ALPRAZolam (XANAX) 0.5 MG tablet take 1 tablet 1 hour prior to MRI 1 tablet 0  ? diltiazem (CARDIZEM CD) 240 MG 24 hr capsule TAKE ONE CAPSULE BY MOUTH DAILY 90 capsule 0  ? diltiazem (CARDIZEM) 30 MG tablet Take 1 tablet (30 mg total) by mouth daily as needed (palpitations). 90 tablet 0  ? EPINEPHrine 0.3 mg/0.3 mL IJ SOAJ injection Inject 0.3 mg into the muscle as needed for anaphylaxis.    ? gabapentin (NEURONTIN) 400 MG capsule Take 400 mg by mouth 3 (three) times daily.    ? hydrOXYzine (VISTARIL) 25 MG capsule Take 25 mg by mouth 2 (two) times daily.    ? linaclotide (LINZESS) 290 MCG CAPS capsule Take 1 capsule (290 mcg total) by mouth daily before breakfast. 30 capsule 3  ? lithium carbonate 300 MG capsule Take 600 mg by mouth at bedtime.    ? nitroGLYCERIN (NITROSTAT) 0.4 MG SL tablet PLACE ONE TABLET UNDER THE TONGUE EVERY 5 MINUTES AS NEEDED FOR CHEST PAIN. MAXIMUM OF 3 DOSES. 25 tablet 0  ? omeprazole (PRILOSEC) 40 MG capsule Take 40 mg by mouth 2 (two) times  daily.     ? ondansetron (ZOFRAN ODT) 4 MG disintegrating tablet Take 1 tablet (4 mg total) by mouth every 8 (eight) hours as needed for nausea or vomiting. 12 tablet 0  ? rosuvastatin (CRESTOR) 20 MG tablet Take 1 tablet (20 mg total) by mouth at bedtime. 90 tablet 0  ? tiZANidine (ZANAFLEX) 4 MG tablet Take 4 mg by mouth at bedtime.    ? topiramate (TOPAMAX) 50 MG tablet Taking 100 mg at night    ? zolpidem (AMBIEN) 10 MG tablet Take 10 mg by mouth at bedtime.    ? ?No current facility-administered medications for this visit.  ?  ? ?Review of Systems  ?  ?Stable intermittent palpitations associate with mild chest discomfort.  Recent chest wall tenderness following chest wall trauma though this has improved.  She denies PND, orthopnea, dizziness, syncope, edema, or early satiety.  All other systems reviewed and are otherwise negative except as noted above. ?  ? ?Physical Exam  ?  ?  VS:  BP 122/62 (BP Location: Left Arm, Patient Position: Sitting, Cuff Size: Normal)   Pulse (!) 51   Ht '5\' 2"'$  (1.575 m)   Wt 132 lb (59.9 kg)   LMP 09/24/2016 (Approximate)   SpO2 98%   BMI 24.14 kg/m?  , BMI Body mass index is 24.14 kg/m?. ?    ?GEN: Well nourished, well developed, in no acute distress. ?HEENT: normal. ?Neck: Supple, no JVD, carotid bruits, or masses. ?Cardiac: RRR, no murmurs, rubs, or gallops. No clubbing, cyanosis, edema.  Radials/DP/PT 2+ and equal bilaterally.  ?Respiratory:  Respirations regular and unlabored, clear to auscultation bilaterally. ?GI: Soft, nontender, nondistended, BS + x 4. ?MS: no deformity or atrophy. ?Skin: warm and dry, no rash. ?Neuro:  Strength and sensation are intact. ?Psych: Normal affect. ? ?Accessory Clinical Findings  ?  ?ECG personally reviewed by me today -sinus bradycardia, 51, no acute ST or T changes. ? ?Lab Results  ?Component Value Date  ? WBC 5.6 02/07/2022  ? HGB 13.8 02/07/2022  ? HCT 42.4 02/07/2022  ? MCV 96.6 02/07/2022  ? PLT 240 02/07/2022  ? ?Lab Results   ?Component Value Date  ? CREATININE 0.84 12/19/2021  ? BUN 8 12/19/2021  ? NA 140 12/19/2021  ? K 3.5 12/19/2021  ? CL 106 12/19/2021  ? CO2 22 12/19/2021  ? ?Lab Results  ?Component Value Date  ? ALT 38 (H) 12/19/2021  ? AST

## 2022-05-06 ENCOUNTER — Ambulatory Visit: Payer: PPO | Admitting: Gastroenterology

## 2022-07-01 ENCOUNTER — Other Ambulatory Visit: Payer: PPO

## 2022-08-30 NOTE — Progress Notes (Unsigned)
New patient visit   Patient: Melinda Shaw   DOB: [birth year]   56 y.o. Female  MRN: [id number] Visit Date: 09/05/2022  Today's healthcare provider: Gwyneth Sprout, FNP   No chief complaint on file.  Subjective    Melinda Shaw is a 56 y.o. female who presents today as a new patient to establish care.  HPI  ***  Past Medical History:  Diagnosis Date   Anemia    H/O   Anxiety    Bronchitis    Chest pain    a. 09/2018 Cath: Nl cors. EF 65%->Med rx (long-acting nitrate added).   Depression    Diastolic dysfunction    a. 10/2018 Echo: EF 55-60%, no rwma, Gr1DD. Mild MR. Nl RV fxn.   DOE (dyspnea on exertion)    Fibromyalgia    GERD (gastroesophageal reflux disease)    Headache    Hyperlipidemia    Hypertension    no meds   Palpitations    a. 01/2019 [facility]): NSR w/ rare PACs & PVCs; b. 04/2020 [facility]: Predominantly sinus rhythm @ 75 (48-141). Rare PACs/PVCs. No significant arrhythmias or prolonged pauses.  No triggered events.   PONV (postoperative nausea and vomiting)    Past Surgical History:  Procedure Laterality Date   ANTERIOR CRUCIATE LIGAMENT REPAIR     CARDIAC CATHETERIZATION     CHOLECYSTECTOMY     CORONARY ANGIOPLASTY     ESOPHAGOGASTRODUODENOSCOPY (EGD) WITH PROPOFOL N/A 08/28/2015   Procedure: ESOPHAGOGASTRODUODENOSCOPY (EGD) WITH PROPOFOL;  Surgeon: Josefine Class, MD;  Location: [facility] ENDOSCOPY;  Service: Endoscopy;  Laterality: N/A;   ESOPHAGOGASTRODUODENOSCOPY (EGD) WITH PROPOFOL N/A 01/31/2022   Procedure: ESOPHAGOGASTRODUODENOSCOPY (EGD) WITH PROPOFOL;  Surgeon: [name], MD;  Location: [facility] ENDOSCOPY;  Service: Gastroenterology;  Laterality: N/A;   KNEE SURGERY     left knee    LEFT HEART CATH AND CORONARY ANGIOGRAPHY Left 09/22/2018   Procedure: LEFT HEART CATH AND CORONARY ANGIOGRAPHY;  Surgeon: Nelva Bush, MD;  Location: [place] CV LAB;  Service: Cardiovascular;  Laterality: Left;   MASS EXCISION Left 01/26/2016   Procedure:  EXCISION OF LEFT WRIST VOLAR MASS;  Surgeon: Charlotte Crumb, MD;  Location: [place];  Service: Orthopedics;  Laterality: Left;   NOSE SURGERY     POLYPECTOMY     TUBAL LIGATION     VIDEO BRONCHOSCOPY N/A 05/29/2015   Procedure: VIDEO BRONCHOSCOPY WITHOUT FLUORO;  Surgeon: Vilinda Boehringer, MD;  Location: [facility] ORS;  Service: Cardiopulmonary;  Laterality: N/A;   Family Status  Relation Name Status   Mother  Deceased   Father  Deceased   Sister  Alive   Family History  Problem Relation Age of Onset   COPD Mother    High Cholesterol Mother    Heart failure Mother    Hypertension Mother    Hypertension Father    AAA (abdominal aortic aneurysm) Sister    COPD Sister    High Cholesterol Sister    Social History   Socioeconomic History   Marital status: Married    Spouse name: Not on file   Number of children: Not on file   Years of education: Not on file   Highest education level: Not on file  Occupational History   Not on file  Tobacco Use   Smoking status: Never   Smokeless tobacco: Never  Vaping Use   Vaping Use: Never used  Substance and Sexual Activity   Alcohol use: No    Alcohol/week: 0.0  standard drinks of alcohol   Drug use: No   Sexual activity: Yes  Other Topics Concern   Not on file  Social History Narrative   Not on file   Social Determinants of Health   Financial Resource Strain: Not on file  Food Insecurity: Not on file  Transportation Needs: Not on file  Physical Activity: Not on file  Stress: Not on file  Social Connections: Not on file   Outpatient Medications Prior to Visit  Medication Sig   albuterol (PROVENTIL HFA;VENTOLIN HFA) 108 (90 BASE) MCG/ACT inhaler Inhale 2 puffs into the lungs every 6 (six) hours as needed for wheezing or shortness of breath.   ALPRAZolam (XANAX) 0.5 MG tablet take 1 tablet 1 hour prior to MRI   diltiazem (CARDIZEM CD) 240 MG 24 hr capsule TAKE ONE CAPSULE BY MOUTH DAILY   diltiazem (CARDIZEM) 30  MG tablet Take 1 tablet (30 mg total) by mouth daily as needed (palpitations).   EPINEPHrine 0.3 mg/0.3 mL IJ SOAJ injection Inject 0.3 mg into the muscle as needed for anaphylaxis.   gabapentin (NEURONTIN) 400 MG capsule Take 400 mg by mouth 3 (three) times daily.   hydrOXYzine (VISTARIL) 25 MG capsule Take 25 mg by mouth 2 (two) times daily.   linaclotide (LINZESS) 290 MCG CAPS capsule Take 1 capsule (290 mcg total) by mouth daily before breakfast.   lithium carbonate 300 MG capsule Take 600 mg by mouth at bedtime.   nitroGLYCERIN (NITROSTAT) 0.4 MG SL tablet PLACE ONE TABLET UNDER THE TONGUE EVERY 5 MINUTES AS NEEDED FOR CHEST PAIN. MAXIMUM OF 3 DOSES.   omeprazole (PRILOSEC) 40 MG capsule Take 40 mg by mouth 2 (two) times daily.    ondansetron (ZOFRAN ODT) 4 MG disintegrating tablet Take 1 tablet (4 mg total) by mouth every 8 (eight) hours as needed for nausea or vomiting.   rosuvastatin (CRESTOR) 20 MG tablet Take 1 tablet (20 mg total) by mouth at bedtime.   tiZANidine (ZANAFLEX) 4 MG tablet Take 4 mg by mouth at bedtime.   topiramate (TOPAMAX) 50 MG tablet Taking 100 mg at night   zolpidem (AMBIEN) 10 MG tablet Take 10 mg by mouth at bedtime.   No facility-administered medications prior to visit.   Allergies  Allergen Reactions   Cherry Swelling    Swelling of lips and throat   Shellfish Allergy Shortness Of Breath   Sulfa Antibiotics Shortness Of Breath    Stopped breathing, Swelling of throat   Bee Venom     Swelling of throat   Imdur [Isosorbide Nitrate]     Worsening headaches     Immunization History  Administered Date(s) Administered   Influenza Whole 06/11/2021    Health Maintenance  Topic Date Due   COVID-19 Vaccine (1) Never done   TETANUS/TDAP  Never done   Zoster Vaccines- Shingrix (1 of 2) Never done   COLONOSCOPY (Pts 45-41yr Insurance coverage will need to be confirmed)  Never done   PAP SMEAR-Modifier  01/09/2020   INFLUENZA VACCINE  06/11/2022    MAMMOGRAM  03/19/2024   Hepatitis C Screening  Completed   HIV Screening  Completed   HPV VACCINES  Aged Out    Patient Care Team: Pcp, No as PCP - General End, CHarrell Gave MD as PCP - Cardiology (Cardiology) LRico Junker RN as Registered Nurse STheodore Demark RN (Inactive) as Registered Nurse  Review of Systems  {Labs  Heme  Chem  Endocrine  Serology  Results Review (optional):23779}  Objective    LMP 09/24/2016 (Approximate)  {Show previous vital signs (optional):23777}  Physical Exam ***  Depression Screen     No data to display         No results found for any visits on 09/05/22.  Assessment & Plan     ***  No follow-ups on file.     {provider attestation***:1}   Gwyneth Sprout, [place] [phone] (phone) [phone] (fax)  [place]

## 2022-09-05 ENCOUNTER — Ambulatory Visit (INDEPENDENT_AMBULATORY_CARE_PROVIDER_SITE_OTHER): Payer: PPO | Admitting: Family Medicine

## 2022-09-05 ENCOUNTER — Encounter: Payer: Self-pay | Admitting: Family Medicine

## 2022-09-05 VITALS — BP 133/83 | HR 87 | Resp 16 | Ht 62.0 in | Wt 135.0 lb

## 2022-09-05 DIAGNOSIS — R002 Palpitations: Secondary | ICD-10-CM

## 2022-09-05 DIAGNOSIS — Z79899 Other long term (current) drug therapy: Secondary | ICD-10-CM | POA: Insufficient documentation

## 2022-09-05 DIAGNOSIS — Z23 Encounter for immunization: Secondary | ICD-10-CM | POA: Diagnosis not present

## 2022-09-05 DIAGNOSIS — F419 Anxiety disorder, unspecified: Secondary | ICD-10-CM | POA: Diagnosis not present

## 2022-09-05 DIAGNOSIS — Z1329 Encounter for screening for other suspected endocrine disorder: Secondary | ICD-10-CM | POA: Insufficient documentation

## 2022-09-05 DIAGNOSIS — Z131 Encounter for screening for diabetes mellitus: Secondary | ICD-10-CM | POA: Insufficient documentation

## 2022-09-05 DIAGNOSIS — I10 Essential (primary) hypertension: Secondary | ICD-10-CM

## 2022-09-05 DIAGNOSIS — F32A Depression, unspecified: Secondary | ICD-10-CM

## 2022-09-05 DIAGNOSIS — E785 Hyperlipidemia, unspecified: Secondary | ICD-10-CM

## 2022-09-05 DIAGNOSIS — K760 Fatty (change of) liver, not elsewhere classified: Secondary | ICD-10-CM

## 2022-09-05 MED ORDER — LACTULOSE 10 GM/15ML PO SOLN: 10.0000 g | Freq: Every day | ORAL | 0 refills | Status: DC | PRN

## 2022-09-05 NOTE — Assessment & Plan Note (Signed)
Chronic, hx of R cerebral CVA Goal LDL <70 Repeat LP

## 2022-09-05 NOTE — Assessment & Plan Note (Signed)
Completed; repeat annually

## 2022-09-05 NOTE — Assessment & Plan Note (Signed)
Acute on chronic, associated with near syncope and dizziness Check CBC and TSH

## 2022-09-05 NOTE — Assessment & Plan Note (Signed)
Recommend screening thyroid given report of 75+ lb loss as well as hx of palpitations

## 2022-09-05 NOTE — Assessment & Plan Note (Signed)
Chronic, stable per pt report despite PHQ of 17 Check lithium level Recent fill of klonopin; also notes use of vistaril

## 2022-09-05 NOTE — Assessment & Plan Note (Signed)
Check for DM given hx of weight loss >50#

## 2022-09-05 NOTE — Assessment & Plan Note (Signed)
Hx of lithium use; klonopin; ambien; oxy and norco per PDMP Check lithium level today

## 2022-09-05 NOTE — Assessment & Plan Note (Signed)
Hx of Hep A Nodules seen on previous scans Pt s/p Bx by GI specialist Recommend lactulose to assist with waste removal through BMs; pt agreeable Repeat CMP and GGT today

## 2022-09-06 LAB — CBC WITH DIFFERENTIAL/PLATELET
Basophils Absolute: 0.1 10*3/uL (ref 0.0–0.2)
Basos: 1 %
EOS (ABSOLUTE): 0.3 10*3/uL (ref 0.0–0.4)
Eos: 5 %
Hematocrit: 40.6 % (ref 34.0–46.6)
Hemoglobin: 13.4 g/dL (ref 11.1–15.9)
Immature Grans (Abs): 0 10*3/uL (ref 0.0–0.1)
Immature Granulocytes: 0 %
Lymphocytes Absolute: 2.2 10*3/uL (ref 0.7–3.1)
Lymphs: 33 %
MCH: 30.3 pg (ref 26.6–33.0)
MCHC: 33 g/dL (ref 31.5–35.7)
MCV: 92 fL (ref 79–97)
Monocytes Absolute: 0.3 10*3/uL (ref 0.1–0.9)
Monocytes: 5 %
Neutrophils Absolute: 3.7 10*3/uL (ref 1.4–7.0)
Neutrophils: 56 %
Platelets: 240 10*3/uL (ref 150–450)
RBC: 4.42 x10E6/uL (ref 3.77–5.28)
RDW: 11.9 % (ref 11.7–15.4)
WBC: 6.6 10*3/uL (ref 3.4–10.8)

## 2022-09-06 LAB — COMPREHENSIVE METABOLIC PANEL
ALT: 26 IU/L (ref 0–32)
AST: 23 IU/L (ref 0–40)
Albumin/Globulin Ratio: 1.7 (ref 1.2–2.2)
Albumin: 4.8 g/dL (ref 3.8–4.9)
Alkaline Phosphatase: 127 IU/L — ABNORMAL HIGH (ref 44–121)
BUN/Creatinine Ratio: 8 — ABNORMAL LOW (ref 9–23)
BUN: 7 mg/dL (ref 6–24)
Bilirubin Total: 0.5 mg/dL (ref 0.0–1.2)
CO2: 22 mmol/L (ref 20–29)
Calcium: 10.2 mg/dL (ref 8.7–10.2)
Chloride: 104 mmol/L (ref 96–106)
Creatinine, Ser: 0.85 mg/dL (ref 0.57–1.00)
Globulin, Total: 2.9 g/dL (ref 1.5–4.5)
Glucose: 105 mg/dL — ABNORMAL HIGH (ref 70–99)
Potassium: 3.8 mmol/L (ref 3.5–5.2)
Sodium: 140 mmol/L (ref 134–144)
Total Protein: 7.7 g/dL (ref 6.0–8.5)
eGFR: 80 mL/min/{1.73_m2} (ref >59)

## 2022-09-06 LAB — PROTIME-INR
INR: 1 (ref 0.9–1.2)
Prothrombin Time: 11.1 s (ref 9.1–12.0)

## 2022-09-06 LAB — APTT: aPTT: 28 s (ref 24–33)

## 2022-09-06 LAB — GAMMA GT: GGT: 94 IU/L — ABNORMAL HIGH (ref 0–60)

## 2022-09-06 LAB — TSH+FREE T4
Free T4: 1 ng/dL (ref 0.82–1.77)
TSH: 2.48 u[IU]/mL (ref 0.450–4.500)

## 2022-09-06 LAB — HEMOGLOBIN A1C
Est. average glucose Bld gHb Est-mCnc: 114 mg/dL
Hgb A1c MFr Bld: 5.6 % (ref 4.8–5.6)

## 2022-09-06 LAB — LITHIUM LEVEL: Lithium Lvl: 0.5 mmol/L (ref 0.5–1.2)

## 2022-09-06 NOTE — Progress Notes (Signed)
GGT and alkaline phosphatase remain elevation. Lithium level low normal at 0.5  All other labs are normal and stable.

## 2022-09-27 ENCOUNTER — Ambulatory Visit: Payer: PPO | Admitting: Internal Medicine

## 2022-10-09 NOTE — Progress Notes (Deleted)
      Established patient visit   Patient: Melinda Shaw   DOB: 20-Jul-1966   56 y.o. Female  MRN: [id number] Visit Date: 10/11/2022  Today's healthcare provider: Gwyneth Sprout, FNP   No chief complaint on file.  Subjective    HPI  ***  Medications: Outpatient Medications Prior to Visit  Medication Sig   albuterol (PROVENTIL HFA;VENTOLIN HFA) 108 (90 BASE) MCG/ACT inhaler Inhale 2 puffs into the lungs every 6 (six) hours as needed for wheezing or shortness of breath.   diltiazem (CARDIZEM CD) 240 MG 24 hr capsule TAKE ONE CAPSULE BY MOUTH DAILY   diltiazem (CARDIZEM) 30 MG tablet Take 1 tablet (30 mg total) by mouth daily as needed (palpitations).   EPINEPHrine 0.3 mg/0.3 mL IJ SOAJ injection Inject 0.3 mg into the muscle as needed for anaphylaxis.   gabapentin (NEURONTIN) 400 MG capsule Take 400 mg by mouth 3 (three) times daily.   hydrOXYzine (VISTARIL) 25 MG capsule Take 25 mg by mouth 2 (two) times daily.   lactulose (CHRONULAC) 10 GM/15ML solution Take 15 mLs (10 g total) by mouth daily as needed for mild constipation.   linaclotide (LINZESS) 290 MCG CAPS capsule Take 1 capsule (290 mcg total) by mouth daily before breakfast.   lithium carbonate 300 MG capsule Take 600 mg by mouth at bedtime.   nitroGLYCERIN (NITROSTAT) 0.4 MG SL tablet PLACE ONE TABLET UNDER THE TONGUE EVERY 5 MINUTES AS NEEDED FOR CHEST PAIN. MAXIMUM OF 3 DOSES.   omeprazole (PRILOSEC) 40 MG capsule Take 40 mg by mouth 2 (two) times daily.    ondansetron (ZOFRAN ODT) 4 MG disintegrating tablet Take 1 tablet (4 mg total) by mouth every 8 (eight) hours as needed for nausea or vomiting.   rosuvastatin (CRESTOR) 20 MG tablet Take 1 tablet (20 mg total) by mouth at bedtime.   tiZANidine (ZANAFLEX) 4 MG tablet Take 4 mg by mouth at bedtime.   topiramate (TOPAMAX) 50 MG tablet Taking 100 mg at night   zolpidem (AMBIEN) 10 MG tablet Take 10 mg by mouth at bedtime.   No facility-administered medications prior to  visit.    Review of Systems  {Labs  Heme  Chem  Endocrine  Serology  Results Review (optional):23779}   Objective    LMP 09/24/2016 (Approximate)  {Show previous vital signs (optional):23777}  Physical Exam  ***  No results found for any visits on 10/11/22.  Assessment & Plan     ***  No follow-ups on file.      {provider attestation***:1}   Gwyneth Sprout, [place] [phone] (phone) [phone] (fax)  [place]

## 2022-10-10 ENCOUNTER — Ambulatory Visit: Payer: PPO | Attending: Internal Medicine | Admitting: Internal Medicine

## 2022-10-10 ENCOUNTER — Encounter: Payer: Self-pay | Admitting: Internal Medicine

## 2022-10-10 VITALS — BP 130/68 | HR 52 | Ht 62.0 in | Wt 136.0 lb

## 2022-10-10 DIAGNOSIS — K760 Fatty (change of) liver, not elsewhere classified: Secondary | ICD-10-CM | POA: Diagnosis not present

## 2022-10-10 DIAGNOSIS — R0789 Other chest pain: Secondary | ICD-10-CM | POA: Diagnosis not present

## 2022-10-10 DIAGNOSIS — R002 Palpitations: Secondary | ICD-10-CM | POA: Diagnosis not present

## 2022-10-10 DIAGNOSIS — R072 Precordial pain: Secondary | ICD-10-CM

## 2022-10-10 DIAGNOSIS — I10 Essential (primary) hypertension: Secondary | ICD-10-CM

## 2022-10-10 NOTE — Patient Instructions (Signed)
Medication Instructions:  Your Physician recommend you continue on your current medication as directed.    *If you need a refill on your cardiac medications before your next appointment, please call your pharmacy*   Lab Work: None ordered today   Testing/Procedures: None ordered today   Follow-Up: At [facility], you and your health needs are our priority.  As part of our continuing mission to provide you with exceptional heart care, we have created designated Provider Care Teams.  These Care Teams include your primary Cardiologist (physician) and Advanced Practice Providers (APPs -  Physician Assistants and Nurse Practitioners) who all work together to provide you with the care you need, when you need it.  We recommend signing up for the patient portal called "MyChart".  Sign up information is provided on this After Visit Summary.  MyChart is used to connect with patients for Virtual Visits (Telemedicine).  Patients are able to view lab/test results, encounter notes, upcoming appointments, etc.  Non-urgent messages can be sent to your provider as well.   To learn more about what you can do with MyChart, go to [email or web address].    Your next appointment:   6 month(s)  The format for your next appointment:   In Person  Provider:   You may see Christopher End, MD or one of the following Advanced Practice Providers on your designated Care Team:   Christopher Berge, NP Ryan Dunn, PA-C Cadence Furth, PA-C Sheri Hammock, NP          

## 2022-10-10 NOTE — Progress Notes (Signed)
Follow-up Outpatient Visit Date: 10/10/2022  Primary Care Provider: Gwyneth Sprout, [place] [address]  Chief Complaint: Chest wall pain  HPI:  Melinda Shaw is a 56 y.o. female with history of chest pain with normal coronary arteries by CTA, palpitations, dyspnea on exertion, hypertension, hyperlipidemia, fibromyalgias, bronchitis, and GERD, who presents for follow-up of patient's and chest pain.  She had been doing fairly well until 2 weeks ago, reporting only sporadic "speeding up and slowing down" of her heart at times.  She tripped going up the stairs to her porch and landed on her chest.  She had severe chest and upper abdominal pain, prompting her to go to urgent care.  Radiographs suggested fracture of right eighth rib.  There was also concern for bronchitis; she was given prescriptions for hydrocodone/acetaminophen as well as prednisone and azithromycin.  She only took 1 to half tablets of the hydrocodone/acetaminophen because it made her feel nauseated.  She otherwise has been using sporadic acetaminophen.  She is concerned that her chest and upper abdominal pain has not gotten much better.  She does not have frank dyspnea but notes that it is painful to breathe.  She is scheduled for follow-up with her PCP tomorrow and her pulmonologist next week.  Melinda Shaw otherwise notes that she has only had a few episodes of dizziness since her last visit.  She was diagnosed with liver problems earlier this year that she reports are due to "autoimmune disease" and is following with hepatology at [facility].  --------------------------------------------------------------------------------------------------  Past Medical History:  Diagnosis Date   Anemia    H/O   Anxiety    Bronchitis    Chest pain    a. 09/2018 Cath: Nl cors. EF 65%->Med rx (long-acting nitrate added).   Depression    Diastolic dysfunction    a. 10/2018 Echo: EF 55-60%, no rwma, Gr1DD. Mild MR. Nl RV fxn.    DOE (dyspnea on exertion)    Fibromyalgia    GERD (gastroesophageal reflux disease)    Headache    Hyperlipidemia    Hypertension    no meds   Palpitations    a. 01/2019 [facility]): NSR w/ rare PACs & PVCs; b. 04/2020 [facility]: Predominantly sinus rhythm @ 75 (48-141). Rare PACs/PVCs. No significant arrhythmias or prolonged pauses.  No triggered events.   PONV (postoperative nausea and vomiting)    Past Surgical History:  Procedure Laterality Date   ANTERIOR CRUCIATE LIGAMENT REPAIR     CARDIAC CATHETERIZATION     CHOLECYSTECTOMY     CORONARY ANGIOPLASTY     ESOPHAGOGASTRODUODENOSCOPY (EGD) WITH PROPOFOL N/A 08/28/2015   Procedure: ESOPHAGOGASTRODUODENOSCOPY (EGD) WITH PROPOFOL;  Surgeon: Josefine Class, MD;  Location: [facility] ENDOSCOPY;  Service: Endoscopy;  Laterality: N/A;   ESOPHAGOGASTRODUODENOSCOPY (EGD) WITH PROPOFOL N/A 01/31/2022   Procedure: ESOPHAGOGASTRODUODENOSCOPY (EGD) WITH PROPOFOL;  Surgeon: [name], MD;  Location: [facility] ENDOSCOPY;  Service: Gastroenterology;  Laterality: N/A;   KNEE SURGERY     left knee    LEFT HEART CATH AND CORONARY ANGIOGRAPHY Left 09/22/2018   Procedure: LEFT HEART CATH AND CORONARY ANGIOGRAPHY;  Surgeon: Nelva Bush, MD;  Location: [place] CV LAB;  Service: Cardiovascular;  Laterality: Left;   MASS EXCISION Left 01/26/2016   Procedure: EXCISION OF LEFT WRIST VOLAR MASS;  Surgeon: Charlotte Crumb, MD;  Location: [place];  Service: Orthopedics;  Laterality: Left;   NOSE SURGERY     POLYPECTOMY     TUBAL LIGATION  VIDEO BRONCHOSCOPY N/A 05/29/2015   Procedure: VIDEO BRONCHOSCOPY WITHOUT FLUORO;  Surgeon: Vilinda Boehringer, MD;  Location: [facility] ORS;  Service: Cardiopulmonary;  Laterality: N/A;    Current Meds  Medication Sig   albuterol (PROVENTIL HFA;VENTOLIN HFA) 108 (90 BASE) MCG/ACT inhaler Inhale 2 puffs into the lungs every 6 (six) hours as needed for wheezing or shortness of breath.   aspirin EC 81 MG tablet  Take 81 mg by mouth daily.   clonazePAM (KLONOPIN) 0.5 MG tablet Take 0.5 mg by mouth 3 (three) times daily as needed for anxiety.   diltiazem (CARDIZEM CD) 240 MG 24 hr capsule TAKE ONE CAPSULE BY MOUTH DAILY   diltiazem (CARDIZEM) 30 MG tablet Take 1 tablet (30 mg total) by mouth daily as needed (palpitations).   docusate sodium (COLACE) 100 MG capsule Take 100 mg by mouth 2 (two) times daily.   EPINEPHrine 0.3 mg/0.3 mL IJ SOAJ injection Inject 0.3 mg into the muscle as needed for anaphylaxis.   gabapentin (NEURONTIN) 400 MG capsule Take 400 mg by mouth 3 (three) times daily.   hydrOXYzine (VISTARIL) 25 MG capsule Take 25 mg by mouth 2 (two) times daily.   lactulose (CHRONULAC) 10 GM/15ML solution Take 15 mLs (10 g total) by mouth daily as needed for mild constipation.   linaclotide (LINZESS) 290 MCG CAPS capsule Take 1 capsule (290 mcg total) by mouth daily before breakfast.   lithium carbonate 300 MG capsule Take 600 mg by mouth at bedtime.   nitroGLYCERIN (NITROSTAT) 0.4 MG SL tablet PLACE ONE TABLET UNDER THE TONGUE EVERY 5 MINUTES AS NEEDED FOR CHEST PAIN. MAXIMUM OF 3 DOSES.   omeprazole (PRILOSEC) 40 MG capsule Take 40 mg by mouth 2 (two) times daily.    ondansetron (ZOFRAN ODT) 4 MG disintegrating tablet Take 1 tablet (4 mg total) by mouth every 8 (eight) hours as needed for nausea or vomiting.   rosuvastatin (CRESTOR) 20 MG tablet Take 1 tablet (20 mg total) by mouth at bedtime.   tiZANidine (ZANAFLEX) 4 MG tablet Take 4 mg by mouth at bedtime.   topiramate (TOPAMAX) 50 MG tablet Taking 100 mg at night   zolpidem (AMBIEN) 10 MG tablet Take 10 mg by mouth at bedtime.    Allergies: Cherry, Shellfish allergy, Sulfa antibiotics, Bee venom, and Imdur [isosorbide nitrate]  Social History   Tobacco Use   Smoking status: Never   Smokeless tobacco: Never  Vaping Use   Vaping Use: Never used  Substance Use Topics   Alcohol use: No    Alcohol/week: 0.0 standard drinks of alcohol    Drug use: No    Family History  Problem Relation Age of Onset   COPD Mother    High Cholesterol Mother    Heart failure Mother    Hypertension Mother    Hypertension Father    AAA (abdominal aortic aneurysm) Sister    COPD Sister    High Cholesterol Sister     Review of Systems: A 12-system review of systems was performed and was negative except as noted in the HPI.  --------------------------------------------------------------------------------------------------  Physical Exam: BP 130/68 (BP Location: Left Arm, Patient Position: Sitting, Cuff Size: Normal)   Pulse (!) 52   Ht '5\' 2"'$  (1.575 m)   Wt 136 lb (61.7 kg)   LMP 09/24/2016 (Approximate)   SpO2 98%   BMI 24.87 kg/m   General:  NAD. Neck: No JVD or HJR. Lungs: Clear to auscultation bilaterally without wheezes or crackles. Heart: Regular rate and rhythm without  murmurs, rubs, or gallops. Abdomen: Soft and nondistended.  Mild tenderness noted just above the umbilicus on the right. Extremities: No lower extremity edema. Tenderness along the right lateral chest wall as well as the upper abdomen noted.  Minimal bruising present.  EKG: Sinus bradycardia and nonspecific ST changes.  No significant change from prior tracing on 03/22/2022.  Lab Results  Component Value Date   WBC 6.6 09/05/2022   HGB 13.4 09/05/2022   HCT 40.6 09/05/2022   MCV 92 09/05/2022   PLT 240 09/05/2022    Lab Results  Component Value Date   NA 140 09/05/2022   K 3.8 09/05/2022   CL 104 09/05/2022   CO2 22 09/05/2022   BUN 7 09/05/2022   CREATININE 0.85 09/05/2022   GLUCOSE 105 (H) 09/05/2022   ALT 26 09/05/2022   --------------------------------------------------------------------------------------------------  ASSESSMENT AND PLAN: Chest wall pain: Pain began after falling onto her chest and upper abdomen while going upstairs to her porch about 2 weeks ago.  Evaluation at urgent care notable for suspected right rib fracture.  I am  reluctant to recommend continued acetaminophen use in the setting of her liver disease without further guidance by her PCP or hepatology.  Melinda Shaw also states that she cannot take NSAIDs due to her chronic lithium use.  I have advised her to speak with her PCP at tomorrow's visit about other pain control options.  We discussed obtaining a CT of the chest to better characterize chest wall injuries but have agreed to defer this for now.  Her pain is not consistent with angina.  Coronary CTA in 01/2021 was reassuring with no significant CAD or coronary artery calcification.  Palpitations: Melinda Shaw notes some rises and falls in her heart rate but overall feels like her symptoms are fairly well-controlled.  We will continue with diltiazem.  Hypertension: Blood pressure upper normal today.  Continue diltiazem.  NAFLD: Continue follow-up with GI.  Follow-up: Return to clinic in 6 months.  Nelva Bush, MD 10/10/2022 4:47 PM

## 2022-10-11 ENCOUNTER — Ambulatory Visit: Payer: PPO | Admitting: Family Medicine

## 2022-10-11 ENCOUNTER — Encounter: Payer: Self-pay | Admitting: Internal Medicine

## 2022-10-15 NOTE — Progress Notes (Unsigned)
      Established patient visit   Patient: Melinda Shaw   DOB: May 08, 1966   56 y.o. Female  MRN: [id number] Visit Date: 10/16/2022  Today's healthcare provider: Gwyneth Sprout, FNP   No chief complaint on file.  Subjective    HPI  ***  Medications: Outpatient Medications Prior to Visit  Medication Sig   albuterol (PROVENTIL HFA;VENTOLIN HFA) 108 (90 BASE) MCG/ACT inhaler Inhale 2 puffs into the lungs every 6 (six) hours as needed for wheezing or shortness of breath.   aspirin EC 81 MG tablet Take 81 mg by mouth daily.   clonazePAM (KLONOPIN) 0.5 MG tablet Take 0.5 mg by mouth 3 (three) times daily as needed for anxiety.   diltiazem (CARDIZEM CD) 240 MG 24 hr capsule TAKE ONE CAPSULE BY MOUTH DAILY   diltiazem (CARDIZEM) 30 MG tablet Take 1 tablet (30 mg total) by mouth daily as needed (palpitations).   docusate sodium (COLACE) 100 MG capsule Take 100 mg by mouth 2 (two) times daily.   EPINEPHrine 0.3 mg/0.3 mL IJ SOAJ injection Inject 0.3 mg into the muscle as needed for anaphylaxis.   gabapentin (NEURONTIN) 400 MG capsule Take 400 mg by mouth 3 (three) times daily.   hydrOXYzine (VISTARIL) 25 MG capsule Take 25 mg by mouth 2 (two) times daily.   lactulose (CHRONULAC) 10 GM/15ML solution Take 15 mLs (10 g total) by mouth daily as needed for mild constipation.   linaclotide (LINZESS) 290 MCG CAPS capsule Take 1 capsule (290 mcg total) by mouth daily before breakfast.   lithium carbonate 300 MG capsule Take 600 mg by mouth at bedtime.   nitroGLYCERIN (NITROSTAT) 0.4 MG SL tablet PLACE ONE TABLET UNDER THE TONGUE EVERY 5 MINUTES AS NEEDED FOR CHEST PAIN. MAXIMUM OF 3 DOSES.   omeprazole (PRILOSEC) 40 MG capsule Take 40 mg by mouth 2 (two) times daily.    ondansetron (ZOFRAN ODT) 4 MG disintegrating tablet Take 1 tablet (4 mg total) by mouth every 8 (eight) hours as needed for nausea or vomiting.   rosuvastatin (CRESTOR) 20 MG tablet Take 1 tablet (20 mg total) by mouth at bedtime.    tiZANidine (ZANAFLEX) 4 MG tablet Take 4 mg by mouth at bedtime.   topiramate (TOPAMAX) 50 MG tablet Taking 100 mg at night   zolpidem (AMBIEN) 10 MG tablet Take 10 mg by mouth at bedtime.   No facility-administered medications prior to visit.    Review of Systems  {Labs  Heme  Chem  Endocrine  Serology  Results Review (optional):23779}   Objective    LMP 09/24/2016 (Approximate)  {Show previous vital signs (optional):23777}  Physical Exam  ***  No results found for any visits on 10/16/22.  Assessment & Plan     ***  No follow-ups on file.      {provider attestation***:1}   Gwyneth Sprout, [place] [phone] (phone) [phone] (fax)  [place]

## 2022-10-16 ENCOUNTER — Ambulatory Visit (INDEPENDENT_AMBULATORY_CARE_PROVIDER_SITE_OTHER): Payer: PPO | Admitting: Family Medicine

## 2022-10-16 ENCOUNTER — Encounter: Payer: Self-pay | Admitting: Family Medicine

## 2022-10-16 ENCOUNTER — Other Ambulatory Visit: Payer: Self-pay | Admitting: Family Medicine

## 2022-10-16 VITALS — BP 168/88 | HR 60 | Resp 16 | Ht 62.0 in | Wt 131.0 lb

## 2022-10-16 DIAGNOSIS — R0781 Pleurodynia: Secondary | ICD-10-CM | POA: Insufficient documentation

## 2022-10-16 DIAGNOSIS — I10 Essential (primary) hypertension: Secondary | ICD-10-CM | POA: Diagnosis not present

## 2022-10-16 DIAGNOSIS — R0789 Other chest pain: Secondary | ICD-10-CM | POA: Insufficient documentation

## 2022-10-16 DIAGNOSIS — K760 Fatty (change of) liver, not elsewhere classified: Secondary | ICD-10-CM

## 2022-10-16 MED ORDER — LACTULOSE 10 GM/15ML PO SOLN: 10.0000 g | Freq: Every day | ORAL | 0 refills | Status: DC | PRN

## 2022-10-16 MED ORDER — KETOROLAC TROMETHAMINE 10 MG PO TABS: 10.0000 mg | ORAL_TABLET | Freq: Three times a day (TID) | ORAL | 0 refills | Status: DC | PRN

## 2022-10-16 MED ORDER — KETOROLAC TROMETHAMINE 10 MG PO TABS: 10.0000 mg | ORAL_TABLET | Freq: Four times a day (QID) | ORAL | 0 refills | Status: DC | PRN

## 2022-10-16 NOTE — Assessment & Plan Note (Signed)
Chronic, stable Request for medication refill  

## 2022-10-16 NOTE — Assessment & Plan Note (Addendum)
Chronic, elevated Pt beliefs elevation s/s pain Previously well controlled <140/<90 Currently compliant with medications- ASA 81, Dilt 240 mg XR Continue to monitor See emergent/urgent care if >180/>100 or additional symptoms arise

## 2022-10-16 NOTE — Assessment & Plan Note (Signed)
C/f Costochondritis following syncopal fall up stairs at barn ~3 weeks ago Has completed treatment from UC; continues to use ice, heat, OTC NSAIDs, picked up opioids- however, did not take; defers imaging or PT at this time Trial of oral toradol to assist for next 30 days prn

## 2022-10-17 ENCOUNTER — Ambulatory Visit (INDEPENDENT_AMBULATORY_CARE_PROVIDER_SITE_OTHER): Payer: PPO

## 2022-10-17 ENCOUNTER — Ambulatory Visit (INDEPENDENT_AMBULATORY_CARE_PROVIDER_SITE_OTHER): Payer: PPO | Admitting: Student in an Organized Health Care Education/Training Program

## 2022-10-17 ENCOUNTER — Encounter: Payer: Self-pay | Admitting: Student in an Organized Health Care Education/Training Program

## 2022-10-17 VITALS — BP 130/70 | HR 69 | Temp 97.8°F | Ht 62.0 in | Wt 137.8 lb

## 2022-10-17 VITALS — Ht 62.0 in | Wt 131.0 lb

## 2022-10-17 DIAGNOSIS — R0602 Shortness of breath: Secondary | ICD-10-CM | POA: Diagnosis not present

## 2022-10-17 DIAGNOSIS — Z0000 Encounter for general adult medical examination without abnormal findings: Secondary | ICD-10-CM

## 2022-10-17 MED ORDER — BUDESONIDE-FORMOTEROL FUMARATE 80-4.5 MCG/ACT IN AERO: 2.0000 | INHALATION_SPRAY | Freq: Two times a day (BID) | RESPIRATORY_TRACT | 12 refills | Status: DC

## 2022-10-17 NOTE — Patient Instructions (Signed)
[name] , Thank you for taking time to come for your Medicare Wellness Visit. I appreciate your ongoing commitment to your health goals. Please review the following plan we discussed and let me know if I can assist you in the future.   Screening recommendations/referrals: Colonoscopy: 2022 per pt Mammogram: 03/19/22 Recommended yearly ophthalmology/optometry visit for glaucoma screening and checkup Recommended yearly dental visit for hygiene and checkup  Vaccinations: Influenza vaccine: 12/06/21 Pneumococcal vaccine: n/d Tdap vaccine: n/d Shingles vaccine: n/d  Covid-19: 07/19/20, 08/16/20, 03/02/21  Advanced directives: no  Conditions/risks identified: none  Next appointment: Follow up in one year for your annual wellness visit. 10/21/23 @ 11am by phone  Preventive Care 40-64 Years, Female Preventive care refers to lifestyle choices and visits with your health care provider that can promote health and wellness. What does preventive care include? A yearly physical exam. This is also called an annual well check. Dental exams once or twice a year. Routine eye exams. Ask your health care provider how often you should have your eyes checked. Personal lifestyle choices, including: Daily care of your teeth and gums. Regular physical activity. Eating a healthy diet. Avoiding tobacco and drug use. Limiting alcohol use. Practicing safe sex. Taking low-dose aspirin daily starting at age 80. Taking vitamin and mineral supplements as recommended by your health care provider. What happens during an annual well check? The services and screenings done by your health care provider during your annual well check will depend on your age, overall health, lifestyle risk factors, and family history of disease. Counseling  Your health care provider may ask you questions about your: Alcohol use. Tobacco use. Drug use. Emotional well-being. Home and relationship well-being. Sexual activity. Eating  habits. Work and work Statistician. Method of birth control. Menstrual cycle. Pregnancy history. Screening  You may have the following tests or measurements: Height, weight, and BMI. Blood pressure. Lipid and cholesterol levels. These may be checked every 5 years, or more frequently if you are over 45 years old. Skin check. Lung cancer screening. You may have this screening every year starting at age 41 if you have a 30-pack-year history of smoking and currently smoke or have quit within the past 15 years. Fecal occult blood test (FOBT) of the stool. You may have this test every year starting at age 69. Flexible sigmoidoscopy or colonoscopy. You may have a sigmoidoscopy every 5 years or a colonoscopy every 10 years starting at age 47. Hepatitis C blood test. Hepatitis B blood test. Sexually transmitted disease (STD) testing. Diabetes screening. This is done by checking your blood sugar (glucose) after you have not eaten for a while (fasting). You may have this done every 1-3 years. Mammogram. This may be done every 1-2 years. Talk to your health care provider about when you should start having regular mammograms. This may depend on whether you have a family history of breast cancer. BRCA-related cancer screening. This may be done if you have a family history of breast, ovarian, tubal, or peritoneal cancers. Pelvic exam and Pap test. This may be done every 3 years starting at age 57. Starting at age 51, this may be done every 5 years if you have a Pap test in combination with an HPV test. Bone density scan. This is done to screen for osteoporosis. You may have this scan if you are at high risk for osteoporosis. Discuss your test results, treatment options, and if necessary, the need for more tests with your health care provider. Vaccines  Your  health care provider may recommend certain vaccines, such as: Influenza vaccine. This is recommended every year. Tetanus, diphtheria, and acellular  pertussis (Tdap, Td) vaccine. You may need a Td booster every 10 years. Zoster vaccine. You may need this after age 71. Pneumococcal 13-valent conjugate (PCV13) vaccine. You may need this if you have certain conditions and were not previously vaccinated. Pneumococcal polysaccharide (PPSV23) vaccine. You may need one or two doses if you smoke cigarettes or if you have certain conditions. Talk to your health care provider about which screenings and vaccines you need and how often you need them. This information is not intended to replace advice given to you by your health care provider. Make sure you discuss any questions you have with your health care provider. Document Released: 11/24/2015 Document Revised: 07/17/2016 Document Reviewed: 08/29/2015 Elsevier Interactive Patient Education  2017 [employer] Prevention in the Home Falls can cause injuries. They can happen to people of all ages. There are many things you can do to make your home safe and to help prevent falls. What can I do on the outside of my home? Regularly fix the edges of walkways and driveways and fix any cracks. Remove anything that might make you trip as you walk through a door, such as a raised step or threshold. Trim any bushes or trees on the path to your home. Use bright outdoor lighting. Clear any walking paths of anything that might make someone trip, such as rocks or tools. Regularly check to see if handrails are loose or broken. Make sure that both sides of any steps have handrails. Any raised decks and porches should have guardrails on the edges. Have any leaves, snow, or ice cleared regularly. Use sand or salt on walking paths during winter. Clean up any spills in your garage right away. This includes oil or grease spills. What can I do in the bathroom? Use night lights. Install grab bars by the toilet and in the tub and shower. Do not use towel bars as grab bars. Use non-skid mats or decals in the  tub or shower. If you need to sit down in the shower, use a plastic, non-slip stool. Keep the floor dry. Clean up any water that spills on the floor as soon as it happens. Remove soap buildup in the tub or shower regularly. Attach bath mats securely with double-sided non-slip rug tape. Do not have throw rugs and other things on the floor that can make you trip. What can I do in the bedroom? Use night lights. Make sure that you have a light by your bed that is easy to reach. Do not use any sheets or blankets that are too big for your bed. They should not hang down onto the floor. Have a firm chair that has side arms. You can use this for support while you get dressed. Do not have throw rugs and other things on the floor that can make you trip. What can I do in the kitchen? Clean up any spills right away. Avoid walking on wet floors. Keep items that you use a lot in easy-to-reach places. If you need to reach something above you, use a strong step stool that has a grab bar. Keep electrical cords out of the way. Do not use floor polish or wax that makes floors slippery. If you must use wax, use non-skid floor wax. Do not have throw rugs and other things on the floor that can make you trip. What can  I do with my stairs? Do not leave any items on the stairs. Make sure that there are handrails on both sides of the stairs and use them. Fix handrails that are broken or loose. Make sure that handrails are as long as the stairways. Check any carpeting to make sure that it is firmly attached to the stairs. Fix any carpet that is loose or worn. Avoid having throw rugs at the top or bottom of the stairs. If you do have throw rugs, attach them to the floor with carpet tape. Make sure that you have a light switch at the top of the stairs and the bottom of the stairs. If you do not have them, ask someone to add them for you. What else can I do to help prevent falls? Wear shoes that: Do not have high  heels. Have rubber bottoms. Are comfortable and fit you well. Are closed at the toe. Do not wear sandals. If you use a stepladder: Make sure that it is fully opened. Do not climb a closed stepladder. Make sure that both sides of the stepladder are locked into place. Ask someone to hold it for you, if possible. Clearly mark and make sure that you can see: Any grab bars or handrails. First and last steps. Where the edge of each step is. Use tools that help you move around (mobility aids) if they are needed. These include: Canes. Walkers. Scooters. Crutches. Turn on the lights when you go into a dark area. Replace any light bulbs as soon as they burn out. Set up your furniture so you have a clear path. Avoid moving your furniture around. If any of your floors are uneven, fix them. If there are any pets around you, be aware of where they are. Review your medicines with your doctor. Some medicines can make you feel dizzy. This can increase your chance of falling. Ask your doctor what other things that you can do to help prevent falls. This information is not intended to replace advice given to you by your health care provider. Make sure you discuss any questions you have with your health care provider. Document Released: 08/24/2009 Document Revised: 04/04/2016 Document Reviewed: 12/02/2014 Elsevier Interactive Patient Education  2017 [employer].

## 2022-10-17 NOTE — Progress Notes (Signed)
Virtual Visit via Telephone Note  I connected with  Melinda Shaw on 10/17/22 at 11:15 AM EST by telephone and verified that I am speaking with the correct person using two identifiers.  Location: Patient: home Provider: [facility] Persons participating in the virtual visit: [place]   I discussed the limitations, risks, security and privacy concerns of performing an evaluation and management service by telephone and the availability of in person appointments. The patient expressed understanding and agreed to proceed.  Interactive audio and video telecommunications were attempted between this nurse and patient, however failed, due to patient having technical difficulties OR patient did not have access to video capability.  We continued and completed visit with audio only.  Some vital signs may be absent or patient reported.   Dionisio David, LPN  Subjective:   Melinda Shaw is a 56 y.o. female who presents for Medicare Annual (Subsequent) preventive examination.  Review of Systems     Cardiac Risk Factors include: advanced age (>63mn, >>47women);dyslipidemia     Objective:    Today's Vitals   10/17/22 1112  PainSc: 4    There is no height or weight on file to calculate BMI.     10/17/2022   11:20 AM 01/31/2022    9:50 AM 05/21/2021    3:31 PM 06/06/2020    5:39 PM 09/22/2018    6:41 AM 06/01/2017    7:34 PM 04/17/2017    8:01 PM  Advanced Directives  Does Patient Have a Medical Advance Directive? No No No No  No No  Would patient like information on creating a medical advance directive? No - Patient declined  No - Patient declined No - Patient declined  No - Patient declined No - Patient declined     Information is confidential and restricted. Go to Review Flowsheets to unlock data.    Current Medications (verified) Outpatient Encounter Medications as of 10/17/2022  Medication Sig   albuterol (PROVENTIL HFA;VENTOLIN HFA) 108 (90 BASE) MCG/ACT inhaler  Inhale 2 puffs into the lungs every 6 (six) hours as needed for wheezing or shortness of breath.   aspirin EC 81 MG tablet Take 81 mg by mouth daily.   clonazePAM (KLONOPIN) 0.5 MG tablet Take 0.5 mg by mouth 3 (three) times daily as needed for anxiety.   diltiazem (CARDIZEM CD) 240 MG 24 hr capsule TAKE ONE CAPSULE BY MOUTH DAILY   diltiazem (CARDIZEM) 30 MG tablet Take 1 tablet (30 mg total) by mouth daily as needed (palpitations).   docusate sodium (COLACE) 100 MG capsule Take 100 mg by mouth 2 (two) times daily.   EPINEPHrine 0.3 mg/0.3 mL IJ SOAJ injection Inject 0.3 mg into the muscle as needed for anaphylaxis.   gabapentin (NEURONTIN) 400 MG capsule Take 400 mg by mouth 3 (three) times daily.   hydrOXYzine (VISTARIL) 25 MG capsule Take 25 mg by mouth 2 (two) times daily.   ketorolac (TORADOL) 10 MG tablet Take 1 tablet (10 mg total) by mouth every 8 (eight) hours as needed.   lactulose (CHRONULAC) 10 GM/15ML solution Take 15 mLs (10 g total) by mouth daily as needed for mild constipation.   linaclotide (LINZESS) 290 MCG CAPS capsule Take 1 capsule (290 mcg total) by mouth daily before breakfast.   lithium carbonate 300 MG capsule Take 600 mg by mouth at bedtime.   nitroGLYCERIN (NITROSTAT) 0.4 MG SL tablet PLACE ONE TABLET UNDER THE TONGUE EVERY 5 MINUTES AS NEEDED FOR CHEST PAIN. MAXIMUM OF 3  DOSES.   omeprazole (PRILOSEC) 40 MG capsule Take 40 mg by mouth 2 (two) times daily.    ondansetron (ZOFRAN ODT) 4 MG disintegrating tablet Take 1 tablet (4 mg total) by mouth every 8 (eight) hours as needed for nausea or vomiting.   rosuvastatin (CRESTOR) 20 MG tablet Take 1 tablet (20 mg total) by mouth at bedtime.   tiZANidine (ZANAFLEX) 4 MG tablet Take 4 mg by mouth at bedtime.   topiramate (TOPAMAX) 50 MG tablet Taking 100 mg at night   zolpidem (AMBIEN) 10 MG tablet Take 10 mg by mouth at bedtime.   No facility-administered encounter medications on file as of 10/17/2022.    Allergies  (verified) Cherry, Shellfish allergy, Sulfa antibiotics, Bee venom, and Imdur [isosorbide nitrate]   History: Past Medical History:  Diagnosis Date   Anemia    H/O   Anxiety    Bronchitis    Chest pain    a. 09/2018 Cath: Nl cors. EF 65%->Med rx (long-acting nitrate added).   Depression    Diastolic dysfunction    a. 10/2018 Echo: EF 55-60%, no rwma, Gr1DD. Mild MR. Nl RV fxn.   DOE (dyspnea on exertion)    Fibromyalgia    GERD (gastroesophageal reflux disease)    Headache    Hyperlipidemia    Hypertension    no meds   Palpitations    a. 01/2019 [facility]): NSR w/ rare PACs & PVCs; b. 04/2020 [facility]: Predominantly sinus rhythm @ 75 (48-141). Rare PACs/PVCs. No significant arrhythmias or prolonged pauses.  No triggered events.   PONV (postoperative nausea and vomiting)    Past Surgical History:  Procedure Laterality Date   ANTERIOR CRUCIATE LIGAMENT REPAIR     CARDIAC CATHETERIZATION     CHOLECYSTECTOMY     CORONARY ANGIOPLASTY     ESOPHAGOGASTRODUODENOSCOPY (EGD) WITH PROPOFOL N/A 08/28/2015   Procedure: ESOPHAGOGASTRODUODENOSCOPY (EGD) WITH PROPOFOL;  Surgeon: Josefine Class, MD;  Location: [facility] ENDOSCOPY;  Service: Endoscopy;  Laterality: N/A;   ESOPHAGOGASTRODUODENOSCOPY (EGD) WITH PROPOFOL N/A 01/31/2022   Procedure: ESOPHAGOGASTRODUODENOSCOPY (EGD) WITH PROPOFOL;  Surgeon: [name], MD;  Location: [facility] ENDOSCOPY;  Service: Gastroenterology;  Laterality: N/A;   KNEE SURGERY     left knee    LEFT HEART CATH AND CORONARY ANGIOGRAPHY Left 09/22/2018   Procedure: LEFT HEART CATH AND CORONARY ANGIOGRAPHY;  Surgeon: Nelva Bush, MD;  Location: [place] CV LAB;  Service: Cardiovascular;  Laterality: Left;   MASS EXCISION Left 01/26/2016   Procedure: EXCISION OF LEFT WRIST VOLAR MASS;  Surgeon: Charlotte Crumb, MD;  Location: [place];  Service: Orthopedics;  Laterality: Left;   NOSE SURGERY     POLYPECTOMY     TUBAL LIGATION     VIDEO BRONCHOSCOPY  N/A 05/29/2015   Procedure: VIDEO BRONCHOSCOPY WITHOUT FLUORO;  Surgeon: Vilinda Boehringer, MD;  Location: [facility] ORS;  Service: Cardiopulmonary;  Laterality: N/A;   Family History  Problem Relation Age of Onset   COPD Mother    High Cholesterol Mother    Heart failure Mother    Hypertension Mother    Hypertension Father    AAA (abdominal aortic aneurysm) Sister    COPD Sister    High Cholesterol Sister    Social History   Socioeconomic History   Marital status: Married    Spouse name: Not on file   Number of children: Not on file   Years of education: Not on file   Highest education level: Not on file  Occupational History  Not on file  Tobacco Use   Smoking status: Never   Smokeless tobacco: Never  Vaping Use   Vaping Use: Never used  Substance and Sexual Activity   Alcohol use: No    Alcohol/week: 0.0 standard drinks of alcohol   Drug use: No   Sexual activity: Yes  Other Topics Concern   Not on file  Social History Narrative   Not on file   Social Determinants of Health   Financial Resource Strain: Low Risk  (10/17/2022)   Overall Financial Resource Strain (CARDIA)    Difficulty of Paying Living Expenses: Not very hard  Food Insecurity: No Food Insecurity (10/17/2022)   Hunger Vital Sign    Worried About Running Out of Food in the Last Year: Never true    Ran Out of Food in the Last Year: Never true  Transportation Needs: No Transportation Needs (10/17/2022)   PRAPARE - Hydrologist (Medical): No    Lack of Transportation (Non-Medical): No  Physical Activity: Sufficiently Active (10/17/2022)   Exercise Vital Sign    Days of Exercise per Week: 7 days    Minutes of Exercise per Session: 40 min  Stress: No Stress Concern Present (10/17/2022)   [place]    Feeling of Stress : Only a little  Social Connections: Socially Isolated (10/17/2022)   Social Connection and Isolation  Panel [NHANES]    Frequency of Communication with Friends and Family: Never    Frequency of Social Gatherings with Friends and Family: Never    Attends Religious Services: Never    Printmaker: No    Attends Music therapist: Never    Marital Status: Married    Tobacco Counseling Counseling given: Not Answered   Clinical Intake:  Pre-visit preparation completed: Yes  Pain : 0-10 Pain Score: 4  Pain Type: Acute pain Pain Location: Rib cage Pain Onset: More than a month ago     Nutritional Risks: None Diabetes: No  How often do you need to have someone help you when you read instructions, pamphlets, or other written materials from your doctor or pharmacy?: 1 - Never  Diabetic?no  Interpreter Needed?: No  Information entered by :: Kirke Shaggy, LPN, MBA   Activities of Daily Living    10/17/2022   11:22 AM 10/16/2022    1:09 PM  In your present state of health, do you have any difficulty performing the following activities:  Hearing? 0 0  Vision? 1 1  Difficulty concentrating or making decisions? 1 1  Walking or climbing stairs? 0 0  Dressing or bathing? 0 0  Doing errands, shopping? 0 0  Preparing Food and eating ? N   Using the Toilet? N   In the past six months, have you accidently leaked urine? Y   Do you have problems with loss of bowel control? N   Managing your Medications? N   Managing your Finances? N   Housekeeping or managing your Housekeeping? N     Patient Care Team: Gwyneth Sprout, FNP as PCP - General (Family Medicine) End, Harrell Gave, MD as PCP - Cardiology (Cardiology) Rico Junker, RN as Registered Nurse Theodore Demark, RN (Inactive) as Registered Nurse  Indicate any recent Medical Services you may have received from other than [facility] providers in the past year (date may be approximate).     Assessment:   This is a routine wellness examination  for [employer].  Hearing/Vision screen Hearing  Screening - Comments:: No aids Vision Screening - Comments:: No glasses  Dietary issues and exercise activities discussed: Current Exercise Habits: Home exercise routine, Type of exercise: walking, Time (Minutes): 45, Frequency (Times/Week): 7, Weekly Exercise (Minutes/Week): 315, Intensity: Mild   Goals Addressed             This Visit's Progress    DIET - EAT MORE FRUITS AND VEGETABLES         Depression Screen    10/17/2022   11:18 AM 10/16/2022    1:08 PM 09/05/2022    2:27 PM  PHQ 2/9 Scores  PHQ - 2 Score '2 5 6  '$ PHQ- 9 Score '5 15 17    '$ Fall Risk    10/17/2022   11:20 AM 10/16/2022    1:08 PM 10/13/2022   12:09 PM 09/05/2022    2:25 PM  Fall Risk   Falls in the past year? '1 1 1 1  '$ Number falls in past yr: '1 1 1 '$ 0  Injury with Fall? 1 0 1 0  Risk for fall due to : History of fall(s);Other (Comment) No Fall Risks  No Fall Risks  Risk for fall due to: Comment afib w/ dizziness     Follow up Falls evaluation completed;Falls prevention discussed Falls evaluation completed  Falls evaluation completed    FALL RISK PREVENTION PERTAINING TO THE HOME:  Any stairs in or around the home? Yes  If so, are there any without handrails? No  Home free of loose throw rugs in walkways, pet beds, electrical cords, etc? Yes  Adequate lighting in your home to reduce risk of falls? Yes   ASSISTIVE DEVICES UTILIZED TO PREVENT FALLS:  Life alert? No  Use of a cane, walker or w/c? No  Grab bars in the bathroom? No  Shower chair or bench in shower? No  Elevated toilet seat or a handicapped toilet? No    Cognitive Function:        10/17/2022   11:24 AM  6CIT Screen  What Year? 0 points  What month? 0 points  What time? 0 points  Count back from 20 0 points  Months in reverse 0 points  Repeat phrase 0 points  Total Score 0 points    Immunizations Immunization History  Administered Date(s) Administered   Influenza Whole 06/11/2021   Influenza,inj,Quad PF,6+ Mos  09/05/2022    TDAP status: Due, Education has been provided regarding the importance of this vaccine. Advised may receive this vaccine at local pharmacy or Health Dept. Aware to provide a copy of the vaccination record if obtained from local pharmacy or Health Dept. Verbalized acceptance and understanding.  Flu Vaccine status: Up to date  Pneumococcal vaccine status: Declined,  Education has been provided regarding the importance of this vaccine but patient still declined. Advised may receive this vaccine at local pharmacy or Health Dept. Aware to provide a copy of the vaccination record if obtained from local pharmacy or Health Dept. Verbalized acceptance and understanding.   Covid-19 vaccine status: Completed vaccines  Qualifies for Shingles Vaccine? Yes   Zostavax completed No   Shingrix Completed?: No.    Education has been provided regarding the importance of this vaccine. Patient has been advised to call insurance company to determine out of pocket expense if they have not yet received this vaccine. Advised may also receive vaccine at local pharmacy or Health Dept. Verbalized acceptance and understanding.  Screening Tests Health Maintenance  Topic  Date Due   COVID-19 Vaccine (1) Never done   DTaP/Tdap/Td (1 - Tdap) Never done   Zoster Vaccines- Shingrix (1 of 2) Never done   COLONOSCOPY (Pts 45-103yr Insurance coverage will need to be confirmed)  Never done   PAP SMEAR-Modifier  01/09/2020   MAMMOGRAM  03/20/2023   Medicare Annual Wellness (AWV)  10/18/2023   INFLUENZA VACCINE  Completed   Hepatitis C Screening  Completed   HIV Screening  Completed   HPV VACCINES  Aged Out    Health Maintenance  Health Maintenance Due  Topic Date Due   COVID-19 Vaccine (1) Never done   DTaP/Tdap/Td (1 - Tdap) Never done   Zoster Vaccines- Shingrix (1 of 2) Never done   COLONOSCOPY (Pts 45-430yrInsurance coverage will need to be confirmed)  Never done   PAP SMEAR-Modifier  01/09/2020     Colorectal cancer screening: Type of screening: Colonoscopy. Completed 2022. Repeat every 2 years  Mammogram status: Completed 03/19/22. Repeat every year   Lung Cancer Screening: (Low Dose CT Chest recommended if Age 56-80ears, 30 pack-year currently smoking OR have quit w/in 15years.) does not qualify.    Additional Screening:  Hepatitis C Screening: does qualify; Completed 12/19/21  Vision Screening: Recommended annual ophthalmology exams for early detection of glaucoma and other disorders of the eye. Is the patient up to date with their annual eye exam?  No  Who is the provider or what is the name of the office in which the patient attends annual eye exams? No one If pt is not established with a provider, would they like to be referred to a provider to establish care? No .   Dental Screening: Recommended annual dental exams for proper oral hygiene  Community Resource Referral / Chronic Care Management: CRR required this visit?  No   CCM required this visit?  No      Plan:     I have personally reviewed and noted the following in the patient's chart:   Medical and social history Use of alcohol, tobacco or illicit drugs  Current medications and supplements including opioid prescriptions. Patient is not currently taking opioid prescriptions. Functional ability and status Nutritional status Physical activity Advanced directives List of other physicians Hospitalizations, surgeries, and ER visits in previous 12 months Vitals Screenings to include cognitive, depression, and falls Referrals and appointments  In addition, I have reviewed and discussed with patient certain preventive protocols, quality metrics, and best practice recommendations. A written personalized care plan for preventive services as well as general preventive health recommendations were provided to patient.     LoDionisio DavidLPN   [id number]/[id number] Nurse Notes: none

## 2022-10-17 NOTE — Progress Notes (Signed)
Synopsis: Referred in for shortness of breath by Gwyneth Sprout, FNP  Assessment & Plan:   1. Shortness of breath 2. Broken Rib  Presents to re-establish care with complaint of shortness of breath following a broken rib. She was previously seen in our clinic for possible asthma, and had previously underwent an extensive evaluation with no etiologies identified to shortness of breath. Her lungs are clear on exam and the only notable exam finding is chest wall tenderness over the right anterior chest wall.  While there is report of very minimal bronchiectasis in the past, I did review her most recent chest CT from 2023 and it does not show any parenchymal infiltrates or airway disease (specifically I did not identify bronchiectasis). She has been on PRN albuterol that I will switch to PRN symbicort. I will also obtain a repeat pulmonary function test and prescribe her an incentive spirometry to help with splinting.  - budesonide-formoterol (SYMBICORT) 80-4.5 MCG/ACT inhaler; Inhale 2 puffs into the lungs 2 (two) times daily.  Dispense: 1 each; Refill: 12 - Pulmonary Function Test [facility] Only; Future - AMB REFERRAL FOR DME   Return in about 3 months (around 01/16/2023).  I spent 60 minutes caring for this patient today, including preparing to see the patient, obtaining a medical history , reviewing a separately obtained history, performing a medically appropriate examination and/or evaluation, counseling and educating the patient/family/caregiver, ordering medications, tests, or procedures, documenting clinical information in the electronic health record, and independently interpreting results (not separately reported/billed) and communicating results to the patient/family/caregiver  Armando Reichert, MD [facility] 10/17/2022 4:00 PM    End of visit medications:  Meds ordered this encounter  Medications   budesonide-formoterol (SYMBICORT) 80-4.5 MCG/ACT inhaler    Sig:  Inhale 2 puffs into the lungs 2 (two) times daily.    Dispense:  1 each    Refill:  12     Current Outpatient Medications:    albuterol (PROVENTIL HFA;VENTOLIN HFA) 108 (90 BASE) MCG/ACT inhaler, Inhale 2 puffs into the lungs every 6 (six) hours as needed for wheezing or shortness of breath., Disp: , Rfl:    aspirin EC 81 MG tablet, Take 81 mg by mouth daily., Disp: , Rfl:    budesonide-formoterol (SYMBICORT) 80-4.5 MCG/ACT inhaler, Inhale 2 puffs into the lungs 2 (two) times daily., Disp: 1 each, Rfl: 12   clonazePAM (KLONOPIN) 0.5 MG tablet, Take 0.5 mg by mouth 3 (three) times daily as needed for anxiety., Disp: , Rfl:    diltiazem (CARDIZEM CD) 240 MG 24 hr capsule, TAKE ONE CAPSULE BY MOUTH DAILY, Disp: 90 capsule, Rfl: 0   diltiazem (CARDIZEM) 30 MG tablet, Take 1 tablet (30 mg total) by mouth daily as needed (palpitations)., Disp: 90 tablet, Rfl: 0   docusate sodium (COLACE) 100 MG capsule, Take 100 mg by mouth 2 (two) times daily., Disp: , Rfl:    EPINEPHrine 0.3 mg/0.3 mL IJ SOAJ injection, Inject 0.3 mg into the muscle as needed for anaphylaxis., Disp: , Rfl:    gabapentin (NEURONTIN) 400 MG capsule, Take 400 mg by mouth 3 (three) times daily., Disp: , Rfl:    hydrOXYzine (VISTARIL) 25 MG capsule, Take 25 mg by mouth 2 (two) times daily., Disp: , Rfl:    ketorolac (TORADOL) 10 MG tablet, Take 1 tablet (10 mg total) by mouth every 8 (eight) hours as needed., Disp: 20 tablet, Rfl: 0   lactulose (CHRONULAC) 10 GM/15ML solution, Take 15 mLs (10 g total) by  mouth daily as needed for mild constipation., Disp: 236 mL, Rfl: 0   linaclotide (LINZESS) 290 MCG CAPS capsule, Take 1 capsule (290 mcg total) by mouth daily before breakfast., Disp: 30 capsule, Rfl: 3   lithium carbonate 300 MG capsule, Take 600 mg by mouth at bedtime., Disp: , Rfl:    nitroGLYCERIN (NITROSTAT) 0.4 MG SL tablet, PLACE ONE TABLET UNDER THE TONGUE EVERY 5 MINUTES AS NEEDED FOR CHEST PAIN. MAXIMUM OF 3 DOSES., Disp: 25  tablet, Rfl: 0   omeprazole (PRILOSEC) 40 MG capsule, Take 40 mg by mouth 2 (two) times daily. , Disp: , Rfl:    ondansetron (ZOFRAN ODT) 4 MG disintegrating tablet, Take 1 tablet (4 mg total) by mouth every 8 (eight) hours as needed for nausea or vomiting., Disp: 12 tablet, Rfl: 0   rosuvastatin (CRESTOR) 20 MG tablet, Take 1 tablet (20 mg total) by mouth at bedtime., Disp: 90 tablet, Rfl: 0   tiZANidine (ZANAFLEX) 4 MG tablet, Take 4 mg by mouth at bedtime., Disp: , Rfl:    topiramate (TOPAMAX) 50 MG tablet, Taking 100 mg at night, Disp: , Rfl:    zolpidem (AMBIEN) 10 MG tablet, Take 10 mg by mouth at bedtime., Disp: , Rfl:    Subjective:   PATIENT ID: Melinda Shaw GENDER: female DOB: 1966/07/06, MRN: [id number]  Chief Complaint  Patient presents with   pulmonary consult    Hx of COPD-- SOB with exertion and prod cough with white to yellow mucus. Recent fall and broke ribs--sx have worsened since.     HPI  Melinda Shaw is a pleasant 56 year old female previously seen in clinic by Dr. Alva Garnet in 2019 who presents to re-establish care.  She was previously see by Dr. Alva Garnet, last in 2019, for possible asthma. She had undergone bronchoscopy in 2016 secondary to increased mucus production and BAL grew Proteus. She was on Dulera at that time and her prescription ran out, without her renewing it. She didn't feel a difference after stopping it. She has been using Albuterol twice daily with some improvement.  She recently feel and suffered a broken rib, resulting in increased shortness of breath, prompting referral back to [place] to re-establish care. She was also seen by cardiology for shortness of breath and no cause to her shortness of breath was identified. After an extensive evaluation. She denies any chest tightness, wheezing, cough, sputum production, hemoptysis, fevers, chills, night sweats, or weight loss.  Ancillary information including prior medications, full  medical/surgical/family/social histories, and PFTs (when available) are listed below and have been reviewed.   Review of Systems  Constitutional:  Negative for chills, fever, malaise/fatigue and weight loss.  Respiratory:  Positive for shortness of breath. Negative for cough and wheezing.   Cardiovascular:  Positive for chest pain (site of broken rib).  Skin:  Negative for rash.     Objective:   Vitals:   10/17/22 1526  BP: 130/70  Pulse: 69  Temp: 97.8 F (36.6 C)  TempSrc: Temporal  SpO2: 100%  Weight: 137 lb 12.8 oz (62.5 kg)  Height: '5\' 2"'$  (1.575 m)   100% on RA  BMI Readings from Last 3 Encounters:  10/17/22 25.20 kg/m  10/17/22 23.96 kg/m  10/16/22 23.96 kg/m   Wt Readings from Last 3 Encounters:  10/17/22 137 lb 12.8 oz (62.5 kg)  10/17/22 131 lb (59.4 kg)  10/16/22 131 lb (59.4 kg)    Physical Exam Constitutional:      Appearance: Normal appearance.  HENT:     Head: Normocephalic and atraumatic.     Nose: Nose normal.     Mouth/Throat:     Mouth: Mucous membranes are moist.  Cardiovascular:     Rate and Rhythm: Normal rate and regular rhythm.     Pulses: Normal pulses.     Heart sounds: Normal heart sounds.  Pulmonary:     Effort: Pulmonary effort is normal. No respiratory distress.     Breath sounds: Normal breath sounds. No wheezing or rales.  Abdominal:     Palpations: Abdomen is soft.  Musculoskeletal:     Cervical back: Neck supple.  Neurological:     General: No focal deficit present.     Mental Status: She is alert and oriented to person, place, and time. Mental status is at baseline.     Ancillary Information    Past Medical History:  Diagnosis Date   Anemia    H/O   Anxiety    Bronchitis    Chest pain    a. 09/2018 Cath: Nl cors. EF 65%->Med rx (long-acting nitrate added).   Depression    Diastolic dysfunction    a. 10/2018 Echo: EF 55-60%, no rwma, Gr1DD. Mild MR. Nl RV fxn.   DOE (dyspnea on exertion)    Fibromyalgia     GERD (gastroesophageal reflux disease)    Headache    Hyperlipidemia    Hypertension    no meds   Palpitations    a. 01/2019 [facility]): NSR w/ rare PACs & PVCs; b. 04/2020 [facility]: Predominantly sinus rhythm @ 75 (48-141). Rare PACs/PVCs. No significant arrhythmias or prolonged pauses.  No triggered events.   PONV (postoperative nausea and vomiting)      Family History  Problem Relation Age of Onset   COPD Mother    High Cholesterol Mother    Heart failure Mother    Hypertension Mother    Hypertension Father    AAA (abdominal aortic aneurysm) Sister    COPD Sister    High Cholesterol Sister      Past Surgical History:  Procedure Laterality Date   ANTERIOR CRUCIATE LIGAMENT REPAIR     CARDIAC CATHETERIZATION     CHOLECYSTECTOMY     CORONARY ANGIOPLASTY     ESOPHAGOGASTRODUODENOSCOPY (EGD) WITH PROPOFOL N/A 08/28/2015   Procedure: ESOPHAGOGASTRODUODENOSCOPY (EGD) WITH PROPOFOL;  Surgeon: Josefine Class, MD;  Location: [facility] ENDOSCOPY;  Service: Endoscopy;  Laterality: N/A;   ESOPHAGOGASTRODUODENOSCOPY (EGD) WITH PROPOFOL N/A 01/31/2022   Procedure: ESOPHAGOGASTRODUODENOSCOPY (EGD) WITH PROPOFOL;  Surgeon: [name], MD;  Location: [facility] ENDOSCOPY;  Service: Gastroenterology;  Laterality: N/A;   KNEE SURGERY     left knee    LEFT HEART CATH AND CORONARY ANGIOGRAPHY Left 09/22/2018   Procedure: LEFT HEART CATH AND CORONARY ANGIOGRAPHY;  Surgeon: Nelva Bush, MD;  Location: [place] [facility];  Service: Cardiovascular;  Laterality: Left;   MASS EXCISION Left 01/26/2016   Procedure: EXCISION OF LEFT WRIST VOLAR MASS;  Surgeon: Charlotte Crumb, MD;  Location: [place];  Service: Orthopedics;  Laterality: Left;   NOSE SURGERY     POLYPECTOMY     TUBAL LIGATION     VIDEO BRONCHOSCOPY N/A 05/29/2015   Procedure: VIDEO BRONCHOSCOPY WITHOUT FLUORO;  Surgeon: Vilinda Boehringer, MD;  Location: [facility] ORS;  Service: Cardiopulmonary;  Laterality: N/A;    Social History    Socioeconomic History   Marital status: Married    Spouse name: Not on file   Number of children: Not on  file   Years of education: Not on file   Highest education level: Not on file  Occupational History   Not on file  Tobacco Use   Smoking status: Never   Smokeless tobacco: Never  Vaping Use   Vaping Use: Never used  Substance and Sexual Activity   Alcohol use: No    Alcohol/week: 0.0 standard drinks of alcohol   Drug use: No   Sexual activity: Yes  Other Topics Concern   Not on file  Social History Narrative   Not on file   Social Determinants of Health   Financial Resource Strain: Low Risk  (10/17/2022)   Overall Financial Resource Strain (CARDIA)    Difficulty of Paying Living Expenses: Not very hard  Food Insecurity: No Food Insecurity (10/17/2022)   Hunger Vital Sign    Worried About Running Out of Food in the Last Year: Never true    Ran Out of Food in the Last Year: Never true  Transportation Needs: No Transportation Needs (10/17/2022)   PRAPARE - Hydrologist (Medical): No    Lack of Transportation (Non-Medical): No  Physical Activity: Sufficiently Active (10/17/2022)   Exercise Vital Sign    Days of Exercise per Week: 7 days    Minutes of Exercise per Session: 40 min  Stress: No Stress Concern Present (10/17/2022)   [place]    Feeling of Stress : Only a little  Social Connections: Socially Isolated (10/17/2022)   Social Connection and Isolation Panel [NHANES]    Frequency of Communication with Friends and Family: Never    Frequency of Social Gatherings with Friends and Family: Never    Attends Religious Services: Never    Marine scientist or Organizations: No    Attends Archivist Meetings: Never    Marital Status: Married  Human resources officer Violence: Not At Risk (10/17/2022)   Humiliation, Afraid, Rape, and Kick questionnaire    Fear of Current  or Ex-Partner: No    Emotionally Abused: No    Physically Abused: No    Sexually Abused: No     Allergies  Allergen Reactions   Cherry Swelling    Swelling of lips and throat   Shellfish Allergy Shortness Of Breath   Sulfa Antibiotics Shortness Of Breath    Stopped breathing, Swelling of throat   Bee Venom     Swelling of throat   Imdur [Isosorbide Nitrate]     Worsening headaches      CBC    Component Value Date/Time   WBC 6.6 09/05/2022 1510   WBC 5.6 02/07/2022 0919   RBC 4.42 09/05/2022 1510   RBC 4.39 02/07/2022 0919   HGB 13.4 09/05/2022 1510   HCT 40.6 09/05/2022 1510   PLT 240 09/05/2022 1510   MCV 92 09/05/2022 1510   MCV 91 02/06/2015 1756   MCH 30.3 09/05/2022 1510   MCH 31.4 02/07/2022 0919   MCHC 33.0 09/05/2022 1510   MCHC 32.5 02/07/2022 0919   RDW 11.9 09/05/2022 1510   RDW 14.0 02/06/2015 1756   LYMPHSABS 2.2 09/05/2022 1510   EOSABS 0.3 09/05/2022 1510   BASOSABS 0.1 09/05/2022 1510    Pulmonary Functions Testing Results:    Latest Ref Rng & Units 04/25/2015   12:50 PM  PFT Results  FVC-Pre L 2.76  P  FVC-Predicted Pre % 83  P  FVC-Post L 2.76  P  FVC-Predicted Post % 83  P  Pre FEV1/FVC % % 81  P  Post FEV1/FCV % % 83  P  FEV1-Pre L 2.23  P  FEV1-Predicted Pre % 84  P  FEV1-Post L 2.28  P  DLCO uncorrected ml/min/mmHg 24.06  P  DLCO UNC% % 111  P  DLVA Predicted % 98  P    P Preliminary result    Outpatient Medications Prior to Visit  Medication Sig Dispense Refill   albuterol (PROVENTIL HFA;VENTOLIN HFA) 108 (90 BASE) MCG/ACT inhaler Inhale 2 puffs into the lungs every 6 (six) hours as needed for wheezing or shortness of breath.     aspirin EC 81 MG tablet Take 81 mg by mouth daily.     clonazePAM (KLONOPIN) 0.5 MG tablet Take 0.5 mg by mouth 3 (three) times daily as needed for anxiety.     diltiazem (CARDIZEM CD) 240 MG 24 hr capsule TAKE ONE CAPSULE BY MOUTH DAILY 90 capsule 0   diltiazem (CARDIZEM) 30 MG tablet Take 1 tablet  (30 mg total) by mouth daily as needed (palpitations). 90 tablet 0   docusate sodium (COLACE) 100 MG capsule Take 100 mg by mouth 2 (two) times daily.     EPINEPHrine 0.3 mg/0.3 mL IJ SOAJ injection Inject 0.3 mg into the muscle as needed for anaphylaxis.     gabapentin (NEURONTIN) 400 MG capsule Take 400 mg by mouth 3 (three) times daily.     hydrOXYzine (VISTARIL) 25 MG capsule Take 25 mg by mouth 2 (two) times daily.     ketorolac (TORADOL) 10 MG tablet Take 1 tablet (10 mg total) by mouth every 8 (eight) hours as needed. 20 tablet 0   lactulose (CHRONULAC) 10 GM/15ML solution Take 15 mLs (10 g total) by mouth daily as needed for mild constipation. 236 mL 0   linaclotide (LINZESS) 290 MCG CAPS capsule Take 1 capsule (290 mcg total) by mouth daily before breakfast. 30 capsule 3   lithium carbonate 300 MG capsule Take 600 mg by mouth at bedtime.     nitroGLYCERIN (NITROSTAT) 0.4 MG SL tablet PLACE ONE TABLET UNDER THE TONGUE EVERY 5 MINUTES AS NEEDED FOR CHEST PAIN. MAXIMUM OF 3 DOSES. 25 tablet 0   omeprazole (PRILOSEC) 40 MG capsule Take 40 mg by mouth 2 (two) times daily.      ondansetron (ZOFRAN ODT) 4 MG disintegrating tablet Take 1 tablet (4 mg total) by mouth every 8 (eight) hours as needed for nausea or vomiting. 12 tablet 0   rosuvastatin (CRESTOR) 20 MG tablet Take 1 tablet (20 mg total) by mouth at bedtime. 90 tablet 0   tiZANidine (ZANAFLEX) 4 MG tablet Take 4 mg by mouth at bedtime.     topiramate (TOPAMAX) 50 MG tablet Taking 100 mg at night     zolpidem (AMBIEN) 10 MG tablet Take 10 mg by mouth at bedtime.     No facility-administered medications prior to visit.

## 2022-10-25 ENCOUNTER — Ambulatory Visit (INDEPENDENT_AMBULATORY_CARE_PROVIDER_SITE_OTHER): Payer: PPO | Admitting: Licensed Clinical Social Worker

## 2022-10-25 DIAGNOSIS — F4310 Post-traumatic stress disorder, unspecified: Secondary | ICD-10-CM | POA: Diagnosis not present

## 2022-10-25 DIAGNOSIS — F4323 Adjustment disorder with mixed anxiety and depressed mood: Secondary | ICD-10-CM | POA: Diagnosis not present

## 2022-10-25 DIAGNOSIS — F419 Anxiety disorder, unspecified: Secondary | ICD-10-CM | POA: Diagnosis not present

## 2022-10-25 DIAGNOSIS — F32A Depression, unspecified: Secondary | ICD-10-CM | POA: Diagnosis not present
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
## 2022-10-31 ENCOUNTER — Ambulatory Visit: Payer: Self-pay | Admitting: *Deleted

## 2022-10-31 ENCOUNTER — Ambulatory Visit (INDEPENDENT_AMBULATORY_CARE_PROVIDER_SITE_OTHER): Payer: PPO | Admitting: Family Medicine

## 2022-10-31 ENCOUNTER — Encounter: Payer: Self-pay | Admitting: Family Medicine

## 2022-10-31 VITALS — BP 136/77 | HR 57 | Temp 97.7°F | Wt 140.4 lb

## 2022-10-31 DIAGNOSIS — J441 Chronic obstructive pulmonary disease with (acute) exacerbation: Secondary | ICD-10-CM | POA: Insufficient documentation

## 2022-10-31 MED ORDER — ALBUTEROL SULFATE HFA 108 (90 BASE) MCG/ACT IN AERS: 1.0000 | INHALATION_SPRAY | Freq: Four times a day (QID) | RESPIRATORY_TRACT | 11 refills | Status: DC | PRN

## 2022-10-31 MED ORDER — PREDNISONE 10 MG PO TABS: 10.0000 mg | ORAL_TABLET | Freq: Every day | ORAL | 0 refills | Status: DC

## 2022-10-31 MED ORDER — AMOXICILLIN-POT CLAVULANATE 875-125 MG PO TABS: 1.0000 | ORAL_TABLET | Freq: Two times a day (BID) | ORAL | 0 refills | Status: DC

## 2022-10-31 MED ORDER — TRELEGY ELLIPTA 200-62.5-25 MCG/ACT IN AEPB: 1.0000 | INHALATION_SPRAY | Freq: Every day | RESPIRATORY_TRACT | 0 refills | Status: DC

## 2022-10-31 NOTE — Telephone Encounter (Signed)
  Chief Complaint: chest tightness- COPD Symptoms: patient calling with complaints of changes in breathing- chest tightness- pain with deep breath, congetsion- patient has COPD and she has hx bronchitis and pneumonia  Frequency: patient states her symptoms started getting worse yesterday Pertinent Negatives: Patient denies dizziness, fever Disposition: '[]'$ ED /'[]'$ Urgent Care (no appt availability in office) / '[]'$ Appointment(In office/virtual)/ '[]'$  [place] [facility]/ '[]'$ Home Care/ '[]'$ Refused Recommended Disposition /'[]'$ [place] [facility]/ '[]'$  Follow-up with PCP Additional Notes: Call to office- patient has been scheduled

## 2022-10-31 NOTE — Progress Notes (Signed)
I,Connie R Striblin,acting as a Education administrator for Gwyneth Sprout, FNP.,have documented all relevant documentation on the behalf of Gwyneth Sprout, FNP,as directed by  Gwyneth Sprout, FNP while in the presence of Gwyneth Sprout, FNP.   Established patient visit   Patient: Melinda Shaw   DOB: 01-16-1966   56 y.o. Female  MRN: [id number] Visit Date: 10/31/2022  Today's healthcare provider: Gwyneth Sprout, FNP  Re Introduced to nurse practitioner role and practice setting.  All questions answered.  Discussed provider/patient relationship and expectations.   Chief Complaint  Patient presents with   Cough   Subjective    HPI  Cough: Patient complains of cough. Symptoms began 1 days ago. Cough described as productive of clear sputum. Patient denies fever. Associated symptoms include chills, dyspnea, nasal congestion, and sore throat.   Patient has a history of pneumonia and COPD.   Pt has no known exposure to Covid or Flu  Medications: Outpatient Medications Prior to Visit  Medication Sig   aspirin EC 81 MG tablet Take 81 mg by mouth daily.   budesonide-formoterol (SYMBICORT) 80-4.5 MCG/ACT inhaler Inhale 2 puffs into the lungs 2 (two) times daily.   clonazePAM (KLONOPIN) 0.5 MG tablet Take 0.5 mg by mouth 3 (three) times daily as needed for anxiety.   diltiazem (CARDIZEM CD) 240 MG 24 hr capsule TAKE ONE CAPSULE BY MOUTH DAILY   diltiazem (CARDIZEM) 30 MG tablet Take 1 tablet (30 mg total) by mouth daily as needed (palpitations).   docusate sodium (COLACE) 100 MG capsule Take 100 mg by mouth 2 (two) times daily.   EPINEPHrine 0.3 mg/0.3 mL IJ SOAJ injection Inject 0.3 mg into the muscle as needed for anaphylaxis.   gabapentin (NEURONTIN) 400 MG capsule Take 400 mg by mouth 3 (three) times daily.   hydrOXYzine (VISTARIL) 25 MG capsule Take 25 mg by mouth 2 (two) times daily.   ketorolac (TORADOL) 10 MG tablet Take 1 tablet (10 mg total) by mouth every 8 (eight) hours as needed.   lactulose  (CHRONULAC) 10 GM/15ML solution Take 15 mLs (10 g total) by mouth daily as needed for mild constipation.   linaclotide (LINZESS) 290 MCG CAPS capsule Take 1 capsule (290 mcg total) by mouth daily before breakfast.   lithium carbonate 300 MG capsule Take 600 mg by mouth at bedtime.   nitroGLYCERIN (NITROSTAT) 0.4 MG SL tablet PLACE ONE TABLET UNDER THE TONGUE EVERY 5 MINUTES AS NEEDED FOR CHEST PAIN. MAXIMUM OF 3 DOSES.   omeprazole (PRILOSEC) 40 MG capsule Take 40 mg by mouth 2 (two) times daily.    ondansetron (ZOFRAN ODT) 4 MG disintegrating tablet Take 1 tablet (4 mg total) by mouth every 8 (eight) hours as needed for nausea or vomiting.   rosuvastatin (CRESTOR) 20 MG tablet Take 1 tablet (20 mg total) by mouth at bedtime.   tiZANidine (ZANAFLEX) 4 MG tablet Take 4 mg by mouth at bedtime.   topiramate (TOPAMAX) 50 MG tablet Taking 100 mg at night   zolpidem (AMBIEN) 10 MG tablet Take 10 mg by mouth at bedtime.   [DISCONTINUED] albuterol (PROVENTIL HFA;VENTOLIN HFA) 108 (90 BASE) MCG/ACT inhaler Inhale 2 puffs into the lungs every 6 (six) hours as needed for wheezing or shortness of breath.   No facility-administered medications prior to visit.    Review of Systems    Objective    BP 136/77 (BP Location: Left Arm, Patient Position: Sitting, Cuff Size: Normal)   Pulse (!) 57  Temp 97.7 F (36.5 C) (Oral)   Wt 140 lb 6.4 oz (63.7 kg)   LMP 09/24/2016 (Approximate)   SpO2 100%   BMI 25.68 kg/m   Physical Exam Vitals and nursing note reviewed.  Constitutional:      General: She is not in acute distress.    Appearance: Normal appearance. She is overweight. She is ill-appearing. She is not toxic-appearing or diaphoretic.  HENT:     Head: Normocephalic and atraumatic.     Right Ear: Tympanic membrane, ear canal and external ear normal.     Left Ear: Tympanic membrane, ear canal and external ear normal.     Nose: Congestion present.     Mouth/Throat:     Mouth: Mucous membranes are  moist.     Pharynx: No oropharyngeal exudate or posterior oropharyngeal erythema.  Eyes:     Extraocular Movements: Extraocular movements intact.     Conjunctiva/sclera: Conjunctivae normal.     Pupils: Pupils are equal, round, and reactive to light.  Cardiovascular:     Rate and Rhythm: Normal rate and regular rhythm.     Pulses: Normal pulses.     Heart sounds: Normal heart sounds. No murmur heard.    No friction rub. No gallop.  Pulmonary:     Effort: Pulmonary effort is normal. Prolonged expiration present. No respiratory distress.     Breath sounds: No stridor. Examination of the right-upper field reveals decreased breath sounds. Examination of the left-upper field reveals decreased breath sounds. Examination of the right-middle field reveals decreased breath sounds. Examination of the left-middle field reveals decreased breath sounds. Examination of the right-lower field reveals decreased breath sounds. Examination of the left-lower field reveals decreased breath sounds. Decreased breath sounds, wheezing and rhonchi present. No rales.  Chest:     Chest wall: No tenderness.  Musculoskeletal:        General: No swelling, tenderness, deformity or signs of injury. Normal range of motion.     Right lower leg: No edema.     Left lower leg: No edema.  Skin:    General: Skin is warm and dry.     Capillary Refill: Capillary refill takes less than 2 seconds.     Coloration: Skin is not jaundiced or pale.     Findings: No bruising, erythema, lesion or rash.  Neurological:     General: No focal deficit present.     Mental Status: She is alert and oriented to person, place, and time. Mental status is at baseline.     Cranial Nerves: No cranial nerve deficit.     Sensory: No sensory deficit.     Motor: No weakness.     Coordination: Coordination normal.  Psychiatric:        Mood and Affect: Mood normal.        Behavior: Behavior normal.        Thought Content: Thought content normal.         Judgment: Judgment normal.      No results found for any visits on 10/31/22.  Assessment & Plan     Problem List Items Addressed This Visit       Respiratory   COPD with acute exacerbation ([place]) - Primary    Symptoms x1 day; DOE with productive sputum, chills, congestion and throat pain No known sick contacts Previous hx of copd and pna Inhaled steroids provided prn; abx to assist as well as steroids given acute on chronic symptoms       Relevant  Medications   predniSONE (DELTASONE) 10 MG tablet   albuterol (VENTOLIN HFA) 108 (90 Base) MCG/ACT inhaler   Fluticasone-Umeclidin-Vilant (TRELEGY ELLIPTA) 200-62.5-25 MCG/ACT AEPB   Return if symptoms worsen or fail to improve.     Vonna Kotyk, FNP, have reviewed all documentation for this visit. The documentation on 10/31/22 for the exam, diagnosis, procedures, and orders are all accurate and complete.  Gwyneth Sprout, [place] [phone] (phone) [phone] (fax)  [place]

## 2022-10-31 NOTE — Telephone Encounter (Signed)
Reason for Disposition  [1] Longstanding difficulty breathing (e.g., CHF, COPD, emphysema) AND [2] WORSE than normal  Answer Assessment - Initial Assessment Questions 1. RESPIRATORY STATUS: "Describe your breathing?" (e.g., wheezing, shortness of breath, unable to speak, severe coughing)      Tightness in chest- hurts to breathdeep breath 2. ONSET: "When did this breathing problem begin?"      Worse yesterday 3. PATTERN "Does the difficult breathing come and go, or has it been constant since it started?"      COPD- comes and goes 4. SEVERITY: "How bad is your breathing?" (e.g., mild, moderate, severe)    - MILD: No SOB at rest, mild SOB with walking, speaks normally in sentences, can lie down, no retractions, pulse < 100.    - MODERATE: SOB at rest, SOB with minimal exertion and prefers to sit, cannot lie down flat, speaks in phrases, mild retractions, audible wheezing, pulse 100-120.    - SEVERE: Very SOB at rest, speaks in single words, struggling to breathe, sitting hunched forward, retractions, pulse > 120      Mild/moderate 5. RECURRENT SYMPTOM: "Have you had difficulty breathing before?" If Yes, ask: "When was the last time?" and "What happened that time?"      Yes- hx bronchitis/pneumonia  6. CARDIAC HISTORY: "Do you have any history of heart disease?" (e.g., heart attack, angina, bypass surgery, angioplasty)      Afib 7. LUNG HISTORY: "Do you have any history of lung disease?"  (e.g., pulmonary embolus, asthma, emphysema)     COPD 8. CAUSE: "What do you think is causing the breathing problem?"      Congestion- chest- infection 9. OTHER SYMPTOMS: "Do you have any other symptoms? (e.g., dizziness, runny nose, cough, chest pain, fever)     cough  Protocols used: Breathing Difficulty-A-AH

## 2022-10-31 NOTE — Assessment & Plan Note (Signed)
Symptoms x1 day; DOE with productive sputum, chills, congestion and throat pain No known sick contacts Previous hx of copd and pna Inhaled steroids provided prn; abx to assist as well as steroids given acute on chronic symptoms

## 2022-11-07 ENCOUNTER — Encounter: Payer: Self-pay | Admitting: Family Medicine

## 2022-11-08 ENCOUNTER — Other Ambulatory Visit: Payer: Self-pay | Admitting: Family Medicine

## 2022-11-08 MED ORDER — HYDROCOD POLI-CHLORPHE POLI ER 10-8 MG/5ML PO SUER: 5.0000 mL | Freq: Two times a day (BID) | ORAL | 0 refills | Status: DC | PRN

## 2022-11-14 ENCOUNTER — Ambulatory Visit: Payer: PPO | Admitting: Family Medicine

## 2022-11-15 ENCOUNTER — Ambulatory Visit: Payer: PPO | Admitting: Licensed Clinical Social Worker

## 2022-11-27 ENCOUNTER — Ambulatory Visit: Payer: PPO | Admitting: Internal Medicine

## 2022-12-02 ENCOUNTER — Ambulatory Visit (INDEPENDENT_AMBULATORY_CARE_PROVIDER_SITE_OTHER): Payer: PPO | Admitting: Licensed Clinical Social Worker

## 2022-12-02 DIAGNOSIS — F419 Anxiety disorder, unspecified: Secondary | ICD-10-CM | POA: Diagnosis not present

## 2022-12-02 DIAGNOSIS — F32A Depression, unspecified: Secondary | ICD-10-CM

## 2022-12-02 DIAGNOSIS — F4310 Post-traumatic stress disorder, unspecified: Secondary | ICD-10-CM | POA: Diagnosis not present

## 2022-12-02 DIAGNOSIS — F4323 Adjustment disorder with mixed anxiety and depressed mood: Secondary | ICD-10-CM | POA: Diagnosis not present

## 2022-12-02 NOTE — Progress Notes (Signed)
THERAPIST PROGRESS NOTE  Session Time: 1:19PM -2:14PM   Participation Level: Active  Behavioral Response: Well GroomedAlertAnxious  Type of Therapy: Individual Therapy  Treatment Goals addressed: Anxiety: Reduce overall frequency, intensity and duration of anxiety so that daily functioning is not impaired per pt self report 3 out of 5 sessions.    Intervention: Learn and Implement coping skills that result in the reduction of anxiety and worry and improve daily functioning per pt self report 3 out of 5 documented sessions.     ProgressTowards Goals: Progressing  Interventions: CBT and Supportive  Pt presented in person at [facility] office. Pt and LCSW were present during the visit.    Summary: Melinda Shaw is a 57 y.o. female who presents with continuing symptoms of anxiety and depression. Pt is a married female who lives with her husband in [place].    Pt stated that she has been feeling anxious most days. Pt stated that she enjoys spending time with her pets and stated that her dog helps her when she is feeling anxious in social settings. Pt was able to explore in session how mindful deep breathing could help alleviate anxiety. Pt stated that she does take deep breaths when she is overwhelmed.   Pt was able to explore new coping skills and stated that she enjoys listening to music. Pt was able to explore how [place] and stated that she will use the 5-4-3-2-1 technique to help her stay grounded and will report back on if its helpful for her. Pt reported that she has a psychiatry visit this week with Dr. Shea Evans.    LCSW provided mood monitoring and treatment progress review in the context of this episode of treatment. LCSW reviewed the pt's mood status since last session.    Allowed pt to explore thoughts and feelings associated with life situations and external stressors. Encouraged expression of feelings and used  empathic listening. Pt was oriented to time, place and situation. LCSW validated the pts feelings and thoughts and showed unconditional positive regard.   Discussed with the pt the transition of a new therapist and provided the pt with the choice of continuing services with the new therapist and answered any questions that the pt had about the transition.  Pt agreed to seeing the new therapist and stated that she wanted to do a follow-up visit.   Pt denies SI/HI or A/V hallucinations. Pt was cooperative during visit and was engaged throughout the visit. Pt does not report any other concerns at the time of visit.   Continued Recommendations as followed: Self-care behaviors, positive social engagements, focusing on positive physical and emotional wellness, and focusing on life/work balance.       Suicidal/Homicidal: Nowithout intent/plan  Therapist Response: Educated on the importance of healthy coping skills with the pt. Explored the benefits of healthy coping skills and engaged the pt to discuss ways that they are coping with anxiety and depression. Encouraged the pt to explore new ways to help alleviate anxiety and determined new ways to help with distraction and coping in session. Discussed the benefits of exercise as a way of coping with anxiety and stress. Explored different ways that the pt could cope to include listening to music, being in nature, trying a new hobby and other activities that the pt could try to help with coping.    Educated on grounding technique 5-4-3-2-1 technique to help control difficult emotions and symptoms by turning attention away  from those uncomfortable thoughts, memories or worries and focusing on the present moment. Using the 5-4-3-2-1 technique to take in the details of their surrounding by using the five senses. Explored each step with the pt and engaged the pt to try the technique at home when they are facing difficult emotions. What are five things that you can  see by exploring small details in their environment to include an object, the way that the light reflects off another surface or any other detail of what they see. Next exploring four things that you can feel. Noticing the sensations of how an object feels, how is the chair that they are sitting on feels, how does sun feel on your skin, tuning in to the different sensations of touch. Exploring three things that you can hear to include the sound of the wind, the sound of a clock and other surrounding sounds that are in the environment you are in. Then noticing two things that the pt can smell, to include the air around then do they smell fresh cut grass, a candle or food. Lastly, exploring taste and if there is anything they can taste to include gum, food or another flavors. Engaged with the pt to strive to notice the small details of this technique that usually they would tune out such as distant noises and objects that they see on a daily basis. LCSW encouraged the pt to explore their thoughts and feelings about the technique and how it could be helpful to allow the pt to be present and mindful in the moment.    Educated on mindful deep breathing with the pt. Explored how the technique could help alleviate anxiety. Engaged with the pt to inhale for four seconds, then hold their air in their lungs for four seconds and then slowly exhale for six seconds. Encouraged the pt to practice the mindful deep breathing at home or any place where they feel that they need to take a few minutes to focus on breathing and being in the moment. Discussed with the pt how the technique could be effective in helping with anxiety and is a discreet way to help them stay grounded.    Plan: Return again on 12/16/2022 virtual visit.   Diagnosis:  Encounter Diagnoses  Name Primary?   Anxiety and depression Yes   Adjustment disorder with mixed anxiety and depressed mood    PTSD (post-traumatic stress disorder)     Collaboration  of Care: Chart Review in Epic.   Patient/Guardian was advised Release of Information must be obtained prior to any record release in order to collaborate their care with an outside provider. Patient/Guardian was advised if they have not already done so to contact the registration department to sign all necessary forms in order for [place] to release information regarding their care.   Consent: Patient/Guardian gives verbal consent for treatment and assignment of benefits for services provided during this visit. Patient/Guardian expressed understanding and agreed to proceed.   Lorenda Hatchet 12/02/2022

## 2022-12-06 ENCOUNTER — Encounter: Payer: Self-pay | Admitting: Psychiatry

## 2022-12-06 ENCOUNTER — Ambulatory Visit (INDEPENDENT_AMBULATORY_CARE_PROVIDER_SITE_OTHER): Payer: PPO | Admitting: Psychiatry

## 2022-12-06 VITALS — BP 137/77 | HR 71 | Temp 98.0°F | Ht 62.0 in | Wt 148.6 lb

## 2022-12-06 DIAGNOSIS — Z79899 Other long term (current) drug therapy: Secondary | ICD-10-CM

## 2022-12-06 DIAGNOSIS — F4310 Post-traumatic stress disorder, unspecified: Secondary | ICD-10-CM

## 2022-12-06 DIAGNOSIS — F331 Major depressive disorder, recurrent, moderate: Secondary | ICD-10-CM

## 2022-12-06 DIAGNOSIS — F429 Obsessive-compulsive disorder, unspecified: Secondary | ICD-10-CM | POA: Diagnosis not present

## 2022-12-06 MED ORDER — SERTRALINE HCL 25 MG PO TABS: 25.0000 mg | ORAL_TABLET | Freq: Every day | ORAL | 1 refills | Status: DC
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
## 2022-12-06 NOTE — Patient Instructions (Signed)
Sertraline Tablets What is this medication? SERTRALINE (SER tra leen) treats depression, anxiety, obsessive-compulsive disorder (OCD), post-traumatic stress disorder (PTSD), and premenstrual dysphoric disorder (PMDD). It increases the amount of serotonin in the brain, a hormone that helps regulate mood. It belongs to a group of medications called SSRIs. This medicine may be used for other purposes; ask your health care provider or pharmacist if you have questions. COMMON BRAND NAME(S): Zoloft What should I tell my care team before I take this medication? They need to know if you have any of these conditions: Bleeding disorders Bipolar disorder or a family history of bipolar disorder Frequently drink alcohol Glaucoma Heart disease High blood pressure History of irregular heartbeat History of low levels of calcium, magnesium, or potassium in the blood Liver disease Receiving electroconvulsive therapy Seizures Suicidal thoughts, plans, or attempt by you or a family member Take medications that prevent or treat blood clots Thyroid disease An unusual or allergic reaction to sertraline, other medications, foods, dyes, or preservatives Pregnant or trying to get pregnant Breastfeeding How should I use this medication? Take this medication by mouth with a glass of water. Take it as directed on the prescription label at the same time every day. You can take it with or without food. If it upsets your stomach, take it with food. Do not take your medication more often than directed. Keep taking this medication unless your care team tells you to stop. Stopping it too quickly can cause serious side effects. It can also make your condition worse. A special MedGuide will be given to you by the pharmacist with each prescription and refill. Be sure to read this information carefully each time. Talk to your care team about the use of this medication in children. While it may be prescribed for children as  young as 7 years for selected conditions, precautions do apply. Overdosage: If you think you have taken too much of this medicine contact a poison control center or emergency room at once. NOTE: This medicine is only for you. Do not share this medicine with others. What if I miss a dose? If you miss a dose, take it as soon as you can. If it is almost time for your next dose, take only that dose. Do not take double or extra doses. What may interact with this medication? Do not take this medication with any of the following: Cisapride Dronedarone Linezolid MAOIs, such as Carbex, Eldepryl, Marplan, Nardil, and Parnate Methylene blue (injected into a vein) Pimozide Thioridazine This medication may also interact with the following: Alcohol Amphetamines Aspirin and aspirin-like medications Certain medications for fungal infections, such as ketoconazole, fluconazole, posaconazole, itraconazole Certain medications for irregular heart beat, such as flecainide, quinidine, propafenone Certain medications for mental health conditions Certain medications for migraine headaches, such as almotriptan, eletriptan, frovatriptan, naratriptan, rizatriptan, sumatriptan, zolmitriptan Certain medications for seizures, such as carbamazepine, valproic acid, phenytoin Certain medications for sleep Certain medications that prevent or treat blood clots, such as warfarin, enoxaparin, dalteparin Cimetidine Digoxin Diuretics Fentanyl Isoniazid Lithium NSAIDs, medications for pain and inflammation, such as ibuprofen or naproxen Other medications that cause heart rhythm changes, such as dofetilide Rasagiline Safinamide Supplements, such as St. John's wort, kava kava, valerian Tolbutamide Tramadol Tryptophan This list may not describe all possible interactions. Give your health care provider a list of all the medicines, herbs, non-prescription drugs, or dietary supplements you use. Also tell them if you smoke,  drink alcohol, or use illegal drugs. Some items may interact with your medicine.  What should I watch for while using this medication? Tell your care team if your symptoms do not get better or if they get worse. Visit your care team for regular checks on your progress. Because it may take several weeks to see the full effects of this medication, it is important to continue your treatment as prescribed by your care team. Patients and their families should watch out for new or worsening thoughts of suicide or depression. Also watch out for sudden changes in feelings such as feeling anxious, agitated, panicky, irritable, hostile, aggressive, impulsive, severely restless, overly excited and hyperactive, or not being able to sleep. If this happens, especially at the beginning of treatment or after a change in dose, call your care team. This medication may affect your coordination, reaction time, or judgment. Do not drive or operate machinery until you know how this medication affects you. Sit or stand up slowly to reduce the risk of dizzy or fainting spells. Drinking alcohol with this medication can increase the risk of these side effects. Your mouth may get dry. Chewing sugarless gum or sucking hard candy, and drinking plenty of water may help. Contact your care team if the problem does not go away or is severe. What side effects may I notice from receiving this medication? Side effects that you should report to your care team as soon as possible: Allergic reactions--skin rash, itching, hives, swelling of the face, lips, tongue, or throat Bleeding--bloody or black, tar-like stools, red or dark brown urine, vomiting blood or brown material that looks like coffee grounds, small red or purple spots on skin, unusual bleeding or bruising Heart rhythm changes--fast or irregular heartbeat, dizziness, feeling faint or lightheaded, chest pain, trouble breathing Low sodium level--muscle weakness, fatigue, dizziness,  headache, confusion Serotonin syndrome--irritability, confusion, fast or irregular heartbeat, muscle stiffness, twitching muscles, sweating, high fever, seizure, chills, vomiting, diarrhea Sudden eye pain or change in vision such as blurred vision, seeing halos around lights, vision loss Thoughts of suicide or self-harm, worsening mood Side effects that usually do not require medical attention (report these to your care team if they continue or are bothersome): Change in sex drive or performance Diarrhea Excessive sweating Nausea Tremors or shaking Upset stomach This list may not describe all possible side effects. Call your doctor for medical advice about side effects. You may report side effects to FDA at 1-800-FDA-1088. Where should I keep my medication? Keep out of the reach of children and pets. Store at room temperature between 20 and 25 degrees C (68 and 77 degrees F). Get rid of any unused medication after the expiration date. To get rid of medications that are no longer needed or expired: Take the medication to a medication take-back program. Check with your pharmacy or law enforcement to find a location. If you cannot return the medication, check the label or package insert to see if the medication should be thrown out in the garbage or flushed down the toilet. If you are not sure, ask your care team. If it is safe to put in the trash, empty the medication out of the container. Mix the medication with cat litter, dirt, coffee grounds, or other unwanted substance. Seal the mixture in a bag or container. Put it in the trash. NOTE: This sheet is a summary. It may not cover all possible information. If you have questions about this medicine, talk to your doctor, pharmacist, or health care provider.  2023 Elsevier/Gold Standard (2022-05-28 00:00:00)

## 2022-12-09 ENCOUNTER — Other Ambulatory Visit
Admission: RE | Admit: 2022-12-09 | Discharge: 2022-12-09 | Disposition: A | Discharge status: Home / Self Care | Payer: PPO | Attending: Psychiatry | Admitting: Psychiatry

## 2022-12-09 DIAGNOSIS — F331 Major depressive disorder, recurrent, moderate: Secondary | ICD-10-CM | POA: Diagnosis present

## 2022-12-09 DIAGNOSIS — F4310 Post-traumatic stress disorder, unspecified: Secondary | ICD-10-CM | POA: Insufficient documentation

## 2022-12-09 DIAGNOSIS — Z79899 Other long term (current) drug therapy: Secondary | ICD-10-CM | POA: Diagnosis present

## 2022-12-09 DIAGNOSIS — F429 Obsessive-compulsive disorder, unspecified: Secondary | ICD-10-CM | POA: Diagnosis present

## 2022-12-09 LAB — LITHIUM LEVEL: Lithium Lvl: 0.77 mmol/L (ref 0.60–1.20)

## 2022-12-10 ENCOUNTER — Encounter: Payer: Self-pay | Admitting: Family Medicine

## 2022-12-10 ENCOUNTER — Ambulatory Visit (INDEPENDENT_AMBULATORY_CARE_PROVIDER_SITE_OTHER): Payer: PPO | Admitting: Family Medicine

## 2022-12-10 VITALS — BP 136/86 | HR 90 | Temp 98.7°F | Wt 142.7 lb

## 2022-12-10 DIAGNOSIS — G43E11 Chronic migraine with aura, intractable, with status migrainosus: Secondary | ICD-10-CM | POA: Diagnosis not present

## 2022-12-10 DIAGNOSIS — J020 Streptococcal pharyngitis: Secondary | ICD-10-CM

## 2022-12-10 DIAGNOSIS — K2100 Gastro-esophageal reflux disease with esophagitis, without bleeding: Secondary | ICD-10-CM

## 2022-12-10 DIAGNOSIS — F5104 Psychophysiologic insomnia: Secondary | ICD-10-CM

## 2022-12-10 LAB — URINE DRUGS OF ABUSE SCREEN W ALC, ROUTINE (REF LAB)
Amphetamines, Urine: NEGATIVE ng/mL
Barbiturate, Ur: NEGATIVE ng/mL
Benzodiazepine Quant, Ur: NEGATIVE ng/mL
Cannabinoid Quant, Ur: NEGATIVE ng/mL
Cocaine (Metab.): NEGATIVE ng/mL
Ethanol U, Quan: NEGATIVE %
Methadone Screen, Urine: NEGATIVE ng/mL
Opiate Quant, Ur: NEGATIVE ng/mL
Phencyclidine, Ur: NEGATIVE ng/mL
Propoxyphene, Urine: NEGATIVE ng/mL

## 2022-12-10 LAB — POCT INFLUENZA A/B
Influenza A, POC: NEGATIVE
Influenza B, POC: NEGATIVE

## 2022-12-10 LAB — POCT RAPID STREP A (OFFICE): Rapid Strep A Screen: POSITIVE — AB

## 2022-12-10 LAB — POC COVID19 BINAXNOW: SARS Coronavirus 2 Ag: NEGATIVE

## 2022-12-10 MED ORDER — TIZANIDINE HCL 4 MG PO TABS: 4.0000 mg | ORAL_TABLET | Freq: Every day | ORAL | 1 refills | Status: DC

## 2022-12-10 MED ORDER — OMEPRAZOLE 40 MG PO CPDR: 40.0000 mg | DELAYED_RELEASE_CAPSULE | Freq: Every day | ORAL | 1 refills | Status: DC

## 2022-12-10 MED ORDER — TOPIRAMATE 50 MG PO TABS: 50.0000 mg | ORAL_TABLET | Freq: Two times a day (BID) | ORAL | 1 refills | Status: DC

## 2022-12-10 MED ORDER — CEPHALEXIN 500 MG PO CAPS: 500.0000 mg | ORAL_CAPSULE | Freq: Two times a day (BID) | ORAL | 0 refills | Status: DC

## 2022-12-10 MED ORDER — HYDROXYZINE PAMOATE 25 MG PO CAPS: 25.0000 mg | ORAL_CAPSULE | Freq: Three times a day (TID) | ORAL | 1 refills | Status: DC | PRN

## 2022-12-10 MED ORDER — ZOLPIDEM TARTRATE 10 MG PO TABS: 10.0000 mg | ORAL_TABLET | Freq: Every day | ORAL | 1 refills | Status: DC

## 2022-12-10 NOTE — Progress Notes (Unsigned)
I,Elzia Hott R Cacey Willow,acting as a Education administrator for Gwyneth Sprout, FNP.,have documented all relevant documentation on the behalf of Gwyneth Sprout, FNP,as directed by  Gwyneth Sprout, FNP while in the presence of Gwyneth Sprout, FNP.   Established patient visit   Patient: Melinda Shaw   DOB: 1966/03/09   57 y.o. Female  MRN: [id number] Visit Date: 12/10/2022  Today's healthcare provider: Gwyneth Sprout, FNP  Introduced to nurse practitioner role and practice setting.  All questions answered.  Discussed provider/patient relationship and expectations.   Chief Complaint  Patient presents with   Sore Throat   Subjective    HPI  Sore Throat: Patient complains of sore throat. Symptoms began 2 days ago. Pain is of severe severity. Fever is present, low grade, 100-101. Other associated symptoms have included abdominal pain, chills, cough, decreased appetite, headache, nausea, sleepiness,  and sinus pressure.  Fluid intake is good.  There has not been contact with an individual with known strep.  Current medications include OTC cough medication.    Pt has done not at home testing  Medications: Outpatient Medications Prior to Visit  Medication Sig   albuterol (VENTOLIN HFA) 108 (90 Base) MCG/ACT inhaler Inhale 1-2 puffs into the lungs every 6 (six) hours as needed for wheezing or shortness of breath.   aspirin EC 81 MG tablet Take 81 mg by mouth daily.   budesonide-formoterol (SYMBICORT) 80-4.5 MCG/ACT inhaler Inhale 2 puffs into the lungs 2 (two) times daily.   clonazePAM (KLONOPIN) 0.5 MG tablet Take 0.5 mg by mouth daily as needed for anxiety.   diltiazem (CARDIZEM CD) 240 MG 24 hr capsule TAKE ONE CAPSULE BY MOUTH DAILY   diltiazem (CARDIZEM) 30 MG tablet Take 1 tablet (30 mg total) by mouth daily as needed (palpitations).   docusate sodium (COLACE) 100 MG capsule Take 100 mg by mouth 2 (two) times daily.   EPINEPHrine 0.3 mg/0.3 mL IJ SOAJ injection Inject 0.3 mg into the muscle as needed  for anaphylaxis.   Fluticasone-Umeclidin-Vilant (TRELEGY ELLIPTA) 200-62.5-25 MCG/ACT AEPB Inhale 1 puff into the lungs daily.   gabapentin (NEURONTIN) 400 MG capsule Take 400 mg by mouth 3 (three) times daily.   ketorolac (TORADOL) 10 MG tablet Take 1 tablet (10 mg total) by mouth every 8 (eight) hours as needed.   lactulose (CHRONULAC) 10 GM/15ML solution Take 15 mLs (10 g total) by mouth daily as needed for mild constipation.   linaclotide (LINZESS) 290 MCG CAPS capsule Take 1 capsule (290 mcg total) by mouth daily before breakfast.   lithium carbonate 300 MG capsule Take 600 mg by mouth at bedtime.   mometasone-formoterol (DULERA) 200-5 MCG/ACT AERO Inhale into the lungs.   nitroGLYCERIN (NITROSTAT) 0.4 MG SL tablet PLACE ONE TABLET UNDER THE TONGUE EVERY 5 MINUTES AS NEEDED FOR CHEST PAIN. MAXIMUM OF 3 DOSES.   ondansetron (ZOFRAN ODT) 4 MG disintegrating tablet Take 1 tablet (4 mg total) by mouth every 8 (eight) hours as needed for nausea or vomiting.   predniSONE (DELTASONE) 10 MG tablet Take 1 tablet (10 mg total) by mouth daily with breakfast. Day 1 & 2 take 6 tablets Day 3 &4 take 5 tablets Day 5 &6 take 4 tablets Day 7 & 8 take 3 tablets Day 9 & 10 take 2 tablets Day 11 & 12 take 1 tablet Day 13 & 14 take 1/2 tablet   rosuvastatin (CRESTOR) 20 MG tablet Take 1 tablet (20 mg total) by mouth at bedtime.  sertraline (ZOLOFT) 25 MG tablet Take 1 tablet (25 mg total) by mouth daily with breakfast.   tiotropium (SPIRIVA) 18 MCG inhalation capsule Place into inhaler and inhale.   [DISCONTINUED] hydrOXYzine (VISTARIL) 25 MG capsule Take 25 mg by mouth 3 (three) times daily as needed for anxiety. And sleep   [DISCONTINUED] omeprazole (PRILOSEC) 40 MG capsule Take 40 mg by mouth 2 (two) times daily.    [DISCONTINUED] tiZANidine (ZANAFLEX) 4 MG tablet Take 4 mg by mouth at bedtime.   [DISCONTINUED] topiramate (TOPAMAX) 50 MG tablet Taking 100 mg at night   [DISCONTINUED] zolpidem (AMBIEN) 10 MG  tablet Take 10 mg by mouth at bedtime.   No facility-administered medications prior to visit.    Review of Systems     Objective    BP 136/86 (BP Location: Right Arm, Patient Position: Sitting, Cuff Size: Normal)   Pulse 90   Temp 98.7 F (37.1 C) (Oral)   Wt 142 lb 11.2 oz (64.7 kg)   LMP 09/24/2016 (Approximate)   SpO2 98%   BMI 26.10 kg/m    Physical Exam Vitals and nursing note reviewed.  Constitutional:      General: She is not in acute distress.    Appearance: Normal appearance. She is well-developed and overweight. She is not ill-appearing, toxic-appearing or diaphoretic.  HENT:     Head: Normocephalic and atraumatic.     Right Ear: Tympanic membrane and ear canal normal.     Left Ear: Tympanic membrane and ear canal normal.     Mouth/Throat:     Pharynx: Oropharynx is clear. Posterior oropharyngeal erythema present.     Tonsils: Tonsillar exudate present. No tonsillar abscesses. 1+ on the right. 1+ on the left.  Eyes:     Pupils: Pupils are equal, round, and reactive to light.  Cardiovascular:     Rate and Rhythm: Normal rate and regular rhythm.     Pulses: Normal pulses.     Heart sounds: Normal heart sounds. No murmur heard.    No friction rub. No gallop.  Pulmonary:     Effort: Pulmonary effort is normal. No respiratory distress.     Breath sounds: Normal breath sounds. No stridor. No wheezing, rhonchi or rales.  Chest:     Chest wall: No tenderness.  Abdominal:     General: Bowel sounds are normal.     Palpations: Abdomen is soft.  Musculoskeletal:        General: No swelling, tenderness, deformity or signs of injury. Normal range of motion.     Right lower leg: No edema.     Left lower leg: No edema.  Skin:    General: Skin is warm and dry.     Capillary Refill: Capillary refill takes less than 2 seconds.     Coloration: Skin is not jaundiced or pale.     Findings: No bruising, erythema, lesion or rash.  Neurological:     General: No focal  deficit present.     Mental Status: She is alert and oriented to person, place, and time. Mental status is at baseline.     Cranial Nerves: No cranial nerve deficit.     Sensory: No sensory deficit.     Motor: No weakness.     Coordination: Coordination normal.  Psychiatric:        Mood and Affect: Mood normal.        Behavior: Behavior normal.        Thought Content: Thought content normal.  Judgment: Judgment normal.     Results for orders placed or performed in visit on 12/10/22  POCT rapid strep A  Result Value Ref Range   Rapid Strep A Screen Positive (A) Negative  POC COVID-19  Result Value Ref Range   SARS Coronavirus 2 Ag Negative Negative  POCT Influenza A/B  Result Value Ref Range   Influenza A, POC Negative Negative   Influenza B, POC Negative Negative    Assessment & Plan     Problem List Items Addressed This Visit       Cardiovascular and Mediastinum   Intractable chronic migraine with aura with status migrainosus    Chronic, stable Wishes to continue with previously prescribed medications      Relevant Medications   topiramate (TOPAMAX) 50 MG tablet   tiZANidine (ZANAFLEX) 4 MG tablet     Respiratory   Acute streptococcal pharyngitis - Primary    URI s/s x 2 days with low grade fever. Reports severe throat pain. Has had other symptoms as well. Continues to maintain good fluid intake. Has been out to grocery store; but no known sick contacts. POC testing complete. -flu -covid +strep Will treat as indicated; continue otc supportive medications RTC as needed      Relevant Medications   cephALEXin (KEFLEX) 500 MG capsule   Other Relevant Orders   POCT rapid strep A (Completed)   POC COVID-19 (Completed)   POCT Influenza A/B (Completed)     Digestive   Gastroesophageal reflux disease with esophagitis without hemorrhage    Chronic, stable Wishes to continue with previously prescribed medications      Relevant Medications   omeprazole  (PRILOSEC) 40 MG capsule     Other   Psychophysiological insomnia    Chronic, stable Wishes to continue with previously prescribed medications      Relevant Medications   zolpidem (AMBIEN) 10 MG tablet   tiZANidine (ZANAFLEX) 4 MG tablet   hydrOXYzine (VISTARIL) 25 MG capsule   Return in about 6 months (around 06/10/2023), or if symptoms worsen or fail to improve, for chonic disease management.     Vonna Kotyk, FNP, have reviewed all documentation for this visit. The documentation on 12/11/22 for the exam, diagnosis, procedures, and orders are all accurate and complete.  Gwyneth Sprout, [place] [phone] (phone) [phone] (fax)  [place]

## 2022-12-11 ENCOUNTER — Telehealth: Payer: Self-pay | Admitting: Psychiatry

## 2022-12-11 ENCOUNTER — Encounter: Payer: Self-pay | Admitting: Family Medicine

## 2022-12-11 DIAGNOSIS — G43E11 Chronic migraine with aura, intractable, with status migrainosus: Secondary | ICD-10-CM | POA: Insufficient documentation

## 2022-12-11 DIAGNOSIS — F5104 Psychophysiologic insomnia: Secondary | ICD-10-CM | POA: Insufficient documentation

## 2022-12-11 DIAGNOSIS — J020 Streptococcal pharyngitis: Secondary | ICD-10-CM | POA: Insufficient documentation

## 2022-12-11 DIAGNOSIS — K2100 Gastro-esophageal reflux disease with esophagitis, without bleeding: Secondary | ICD-10-CM | POA: Insufficient documentation

## 2022-12-11 NOTE — Assessment & Plan Note (Signed)
Chronic, stable Wishes to continue with previously prescribed medications

## 2022-12-11 NOTE — Telephone Encounter (Signed)
Reviewed patient's lab results-urine drug screen came back negative.  Per review of controlled substance database-zolpidem was just filled on 12/09/2022.  Hence patient is not due for it at this time.  Clonazepam was picked up on 11/21/2022 for 60 tablets and based on our discussion at her session she only takes it a couple of times a week and hence this prescription will last her until her next visit.  Hence will not refill any of the medications at this time.  Will have CMA contact patient to let us know or requested to her pharmacy when she is due for her zolpidem.

## 2022-12-11 NOTE — Assessment & Plan Note (Signed)
URI s/s x 2 days with low grade fever. Reports severe throat pain. Has had other symptoms as well. Continues to maintain good fluid intake. Has been out to grocery store; but no known sick contacts. POC testing complete. -flu -covid +strep Will treat as indicated; continue otc supportive medications RTC as needed

## 2022-12-12 NOTE — Telephone Encounter (Signed)
Pt notified by phone.

## 2022-12-16 ENCOUNTER — Telehealth: Payer: PPO | Admitting: Licensed Clinical Social Worker

## 2022-12-20 ENCOUNTER — Encounter: Payer: Self-pay | Admitting: Family Medicine

## 2022-12-31 ENCOUNTER — Ambulatory Visit: Payer: PPO | Admitting: Family Medicine

## 2023-01-01 NOTE — Progress Notes (Unsigned)
I,Myrl Lazarus R Youcef Klas,acting as a Education administrator for Gwyneth Sprout, FNP.,have documented all relevant documentation on the behalf of Gwyneth Sprout, FNP,as directed by  Gwyneth Sprout, FNP while in the presence of Gwyneth Sprout, FNP.  Established patient visit  Patient: Melinda Shaw   DOB: [birth year]   57 y.o. Female  MRN: [id number] Visit Date: 01/02/2023  Today's healthcare provider: Gwyneth Sprout, FNP  Introduced to nurse practitioner role and practice setting.  All questions answered.  Discussed provider/patient relationship and expectations.  Chief Complaint  Patient presents with   Follow-up   Subjective    HPI  Follow up for Strep Throat  The patient was last seen for this 3 weeks ago. Changes made at last visit include Keflex 581m.  She reports excellent compliance with treatment. She feels that condition is Unchanged.  Patient states she is still having symptoms of sore throat, fatigue, and facial pain. Fever (101) was present 2 days ago   Patient also complains of urinary smell, no other associated symptoms ----------------------------------------------------------------------------------------- Medications: Outpatient Medications Prior to Visit  Medication Sig   albuterol (VENTOLIN HFA) 108 (90 Base) MCG/ACT inhaler Inhale 1-2 puffs into the lungs every 6 (six) hours as needed for wheezing or shortness of breath.   aspirin EC 81 MG tablet Take 81 mg by mouth daily.   budesonide-formoterol (SYMBICORT) 80-4.5 MCG/ACT inhaler Inhale 2 puffs into the lungs 2 (two) times daily.   cephALEXin (KEFLEX) 500 MG capsule Take 1 capsule (500 mg total) by mouth 2 (two) times daily.   clonazePAM (KLONOPIN) 0.5 MG tablet Take 0.5 mg by mouth daily as needed for anxiety.   diltiazem (CARDIZEM CD) 240 MG 24 hr capsule TAKE ONE CAPSULE BY MOUTH DAILY   diltiazem (CARDIZEM) 30 MG tablet Take 1 tablet (30 mg total) by mouth daily as needed (palpitations).   docusate sodium (COLACE) 100 MG  capsule Take 100 mg by mouth 2 (two) times daily.   EPINEPHrine 0.3 mg/0.3 mL IJ SOAJ injection Inject 0.3 mg into the muscle as needed for anaphylaxis.   Fluticasone-Umeclidin-Vilant (TRELEGY ELLIPTA) 200-62.5-25 MCG/ACT AEPB Inhale 1 puff into the lungs daily.   gabapentin (NEURONTIN) 400 MG capsule Take 400 mg by mouth 3 (three) times daily.   hydrOXYzine (VISTARIL) 25 MG capsule Take 1 capsule (25 mg total) by mouth 3 (three) times daily as needed for anxiety. And sleep   ketorolac (TORADOL) 10 MG tablet Take 1 tablet (10 mg total) by mouth every 8 (eight) hours as needed.   lactulose (CHRONULAC) 10 GM/15ML solution Take 15 mLs (10 g total) by mouth daily as needed for mild constipation.   linaclotide (LINZESS) 290 MCG CAPS capsule Take 1 capsule (290 mcg total) by mouth daily before breakfast.   lithium carbonate 300 MG capsule Take 600 mg by mouth at bedtime.   mometasone-formoterol (DULERA) 200-5 MCG/ACT AERO Inhale into the lungs.   nitroGLYCERIN (NITROSTAT) 0.4 MG SL tablet PLACE ONE TABLET UNDER THE TONGUE EVERY 5 MINUTES AS NEEDED FOR CHEST PAIN. MAXIMUM OF 3 DOSES.   omeprazole (PRILOSEC) 40 MG capsule Take 1 capsule (40 mg total) by mouth daily.   ondansetron (ZOFRAN ODT) 4 MG disintegrating tablet Take 1 tablet (4 mg total) by mouth every 8 (eight) hours as needed for nausea or vomiting.   rosuvastatin (CRESTOR) 20 MG tablet Take 1 tablet (20 mg total) by mouth at bedtime.   sertraline (ZOLOFT) 25 MG tablet Take 1 tablet (25 mg total) by mouth  daily with breakfast.   tiotropium (SPIRIVA) 18 MCG inhalation capsule Place into inhaler and inhale.   tiZANidine (ZANAFLEX) 4 MG tablet Take 1 tablet (4 mg total) by mouth at bedtime.   topiramate (TOPAMAX) 50 MG tablet Take 1 tablet (50 mg total) by mouth 2 (two) times daily.   zolpidem (AMBIEN) 10 MG tablet Take 1 tablet (10 mg total) by mouth at bedtime.   [DISCONTINUED] predniSONE (DELTASONE) 10 MG tablet Take 1 tablet (10 mg total) by  mouth daily with breakfast. Day 1 & 2 take 6 tablets Day 3 &4 take 5 tablets Day 5 &6 take 4 tablets Day 7 & 8 take 3 tablets Day 9 & 10 take 2 tablets Day 11 & 12 take 1 tablet Day 13 & 14 take 1/2 tablet   No facility-administered medications prior to visit.    Review of Systems    Objective    BP 128/78 (BP Location: Right Arm, Patient Position: Sitting, Cuff Size: Normal)   Pulse 73   Temp 97.6 F (36.4 C) (Oral)   Wt 145 lb 14.4 oz (66.2 kg)   LMP 09/24/2016 (Approximate)   SpO2 100%   BMI 26.69 kg/m   Physical Exam Vitals and nursing note reviewed.  Constitutional:      General: She is not in acute distress.    Appearance: Normal appearance. She is overweight. She is not ill-appearing, toxic-appearing or diaphoretic.  HENT:     Head: Normocephalic and atraumatic.     Right Ear: Tympanic membrane, ear canal and external ear normal. There is no impacted cerumen.     Left Ear: Tympanic membrane, ear canal and external ear normal. There is no impacted cerumen.     Nose: Congestion present.     Mouth/Throat:     Mouth: Mucous membranes are moist.     Pharynx: Oropharynx is clear. No oropharyngeal exudate or posterior oropharyngeal erythema.  Eyes:     Extraocular Movements: Extraocular movements intact.     Conjunctiva/sclera: Conjunctivae normal.     Pupils: Pupils are equal, round, and reactive to light.  Cardiovascular:     Rate and Rhythm: Normal rate and regular rhythm.     Pulses: Normal pulses.     Heart sounds: Normal heart sounds. No murmur heard.    No friction rub. No gallop.  Pulmonary:     Effort: Pulmonary effort is normal. No respiratory distress.     Breath sounds: Normal breath sounds. No stridor. No wheezing, rhonchi or rales.  Chest:     Chest wall: No tenderness.  Musculoskeletal:        General: No swelling, tenderness, deformity or signs of injury. Normal range of motion.     Cervical back: Neck supple.     Right lower leg: No edema.     Left  lower leg: No edema.  Skin:    General: Skin is warm and dry.     Capillary Refill: Capillary refill takes less than 2 seconds.     Coloration: Skin is not jaundiced or pale.     Findings: No bruising, erythema, lesion or rash.  Neurological:     General: No focal deficit present.     Mental Status: She is alert and oriented to person, place, and time. Mental status is at baseline.     Cranial Nerves: No cranial nerve deficit.     Sensory: No sensory deficit.     Motor: No weakness.     Coordination: Coordination normal.  Psychiatric:  Mood and Affect: Mood normal.        Behavior: Behavior normal.        Thought Content: Thought content normal.        Judgment: Judgment normal.    Results for orders placed or performed in visit on 01/02/23  POCT Urinalysis Dipstick  Result Value Ref Range   Color, UA light [id number]    Clarity, UA clear    Glucose, UA Negative Negative   Bilirubin, UA negative    Ketones, UA negative    Spec Grav, UA 1.015 1.010 - 1.025   Blood, UA negative    pH, UA 6.5 5.0 - 8.0   Protein, UA Positive (A) Negative   Urobilinogen, UA 0.2 0.2 or 1.0 E.U./dL   Nitrite, UA negative    Leukocytes, UA Negative Negative   Appearance     Odor      Assessment & Plan     Problem List Items Addressed This Visit       Respiratory   COPD, frequent exacerbations ([place]) - Primary    Recurrent complaints since December Has seen pulmonary Continues to feel fatigued       Relevant Medications   levofloxacin (LEVAQUIN) 500 MG tablet   methylPREDNISolone (MEDROL DOSEPAK) 4 MG TBPK tablet   Other Relevant Orders   Ambulatory referral to ENT     Digestive   Dysphagia    Acute on chronic, worsening since steep infection  Referral to GI to assist      Relevant Orders   Ambulatory referral to Gastroenterology     Other   Chronic fatigue and malaise   Relevant Orders   Ambulatory referral to Cardiology   Tenderness over maxillary sinus    Recommend  secondary f/u with ENT      Relevant Orders   Ambulatory referral to ENT   Urinary symptom or sign    UA normal; continue to prioritize water intake with spilling proteins       Relevant Orders   POCT Urinalysis Dipstick (Completed)   Return if symptoms worsen or fail to improve.     Vonna Kotyk, FNP, have reviewed all documentation for this visit. The documentation on 01/02/23 for the exam, diagnosis, procedures, and orders are all accurate and complete.  Gwyneth Sprout, [place] [phone] (phone) [phone] (fax)  [place]

## 2023-01-02 ENCOUNTER — Encounter: Payer: Self-pay | Admitting: Family Medicine

## 2023-01-02 ENCOUNTER — Ambulatory Visit (INDEPENDENT_AMBULATORY_CARE_PROVIDER_SITE_OTHER): Payer: PPO | Admitting: Family Medicine

## 2023-01-02 VITALS — BP 128/78 | HR 73 | Temp 97.6°F | Wt 145.9 lb

## 2023-01-02 DIAGNOSIS — R1310 Dysphagia, unspecified: Secondary | ICD-10-CM | POA: Diagnosis not present

## 2023-01-02 DIAGNOSIS — R5382 Chronic fatigue, unspecified: Secondary | ICD-10-CM

## 2023-01-02 DIAGNOSIS — J3489 Other specified disorders of nose and nasal sinuses: Secondary | ICD-10-CM | POA: Insufficient documentation

## 2023-01-02 DIAGNOSIS — J441 Chronic obstructive pulmonary disease with (acute) exacerbation: Secondary | ICD-10-CM | POA: Diagnosis not present

## 2023-01-02 DIAGNOSIS — R5381 Other malaise: Secondary | ICD-10-CM | POA: Insufficient documentation

## 2023-01-02 DIAGNOSIS — R399 Unspecified symptoms and signs involving the genitourinary system: Secondary | ICD-10-CM | POA: Diagnosis not present

## 2023-01-02 LAB — POCT URINALYSIS DIPSTICK
Bilirubin, UA: NEGATIVE
Blood, UA: NEGATIVE
Glucose, UA: NEGATIVE
Ketones, UA: NEGATIVE
Leukocytes, UA: NEGATIVE
Nitrite, UA: NEGATIVE
Protein, UA: POSITIVE — AB
Spec Grav, UA: 1.015 (ref 1.010–1.025)
Urobilinogen, UA: 0.2 E.U./dL
pH, UA: 6.5 (ref 5.0–8.0)

## 2023-01-02 MED ORDER — LEVOFLOXACIN 500 MG PO TABS: 500.0000 mg | ORAL_TABLET | Freq: Every day | ORAL | 0 refills | Status: DC

## 2023-01-02 MED ORDER — METHYLPREDNISOLONE 4 MG PO TBPK: ORAL_TABLET | ORAL | 0 refills | Status: DC

## 2023-01-02 NOTE — Assessment & Plan Note (Signed)
Recurrent complaints since December Has seen pulmonary Continues to feel fatigued

## 2023-01-02 NOTE — Assessment & Plan Note (Addendum)
UA normal; continue to prioritize water intake with spilling proteins

## 2023-01-02 NOTE — Assessment & Plan Note (Signed)
Acute on chronic, worsening since steep infection  Referral to GI to assist

## 2023-01-02 NOTE — Assessment & Plan Note (Signed)
Recommend secondary f/u with ENT

## 2023-01-19 ENCOUNTER — Encounter: Payer: Self-pay | Admitting: Student in an Organized Health Care Education/Training Program

## 2023-01-22 ENCOUNTER — Encounter: Payer: Self-pay | Admitting: Psychiatry

## 2023-01-22 ENCOUNTER — Ambulatory Visit (INDEPENDENT_AMBULATORY_CARE_PROVIDER_SITE_OTHER): Payer: PPO | Admitting: Psychiatry

## 2023-01-22 VITALS — BP 128/76 | HR 67 | Temp 97.5°F | Ht 62.0 in | Wt 148.2 lb

## 2023-01-22 DIAGNOSIS — F429 Obsessive-compulsive disorder, unspecified: Secondary | ICD-10-CM

## 2023-01-22 DIAGNOSIS — F4310 Post-traumatic stress disorder, unspecified: Secondary | ICD-10-CM | POA: Diagnosis not present

## 2023-01-22 DIAGNOSIS — F331 Major depressive disorder, recurrent, moderate: Secondary | ICD-10-CM

## 2023-01-22 MED ORDER — CLONAZEPAM 0.5 MG PO TABS: 0.5000 mg | ORAL_TABLET | ORAL | 0 refills | Status: DC

## 2023-01-22 MED ORDER — SERTRALINE HCL 50 MG PO TABS: 50.0000 mg | ORAL_TABLET | Freq: Every day | ORAL | 1 refills | Status: DC

## 2023-01-22 MED ORDER — ESZOPICLONE 1 MG PO TABS: 1.0000 mg | ORAL_TABLET | Freq: Every day | ORAL | 0 refills | Status: DC

## 2023-01-22 NOTE — Progress Notes (Signed)
[facility] MD OP Progress Note  01/22/2023 3:03 PM ARIYEL DALSING  MRN:  [id number]  Chief Complaint:  Chief Complaint  Patient presents with   Follow-up   Anxiety   Depression   Medication Refill   HPI: Melinda Shaw is a 57 year old female who lives in [place], married, on disability, has a history of PTSD, MDD, OCD, hypertension, COPD, history of CVA on ASA, fibromyalgia, history of appendiceal neoplasm, history of prior ileocecectomy recent diagnosis of hepatitis was evaluated in office today.  Patient today reports she has noticed an improvement with regards to her mood on the sertraline 25 mg.  She reports she is doing more things than she used to before.  That is an improvement.  She was recently prescribed prednisone for COPD exacerbation.  She reports she does get more energetic when she is on prednisone.  She however has not been on the prednisone since the past 2 weeks or so.  She completed the course.  Patient reports she continues to have episodes of feeling on edge, anxious often.  She has not been going out much and currently stays more at home.  She reports she has been going out for walks.  She reports she takes the clonazepam 2-3 times a week which helps with anxiety.  She is also on hydroxyzine which was prescribed for itching but also helps with anxiety and usually takes it at night.  In spite of that she is not sleeping well at night.  She reports she has difficulty falling asleep and staying asleep.  Has been ongoing since the past several months and does not believe it is due to the prednisone which she recently was on.  Patient denies any suicidality, homicidality or perceptual disturbances.  Patient denies any side effects to medications.  Patient reports she has upcoming appointment with our new therapist Ms. Joellen Jersey, motivated to stay in therapy.  Denies any other concerns today.  Visit Diagnosis:    ICD-10-CM   1. PTSD (post-traumatic stress disorder)  F43.10  eszopiclone (LUNESTA) 1 MG TABS tablet    sertraline (ZOLOFT) 50 MG tablet    clonazePAM (KLONOPIN) 0.5 MG tablet    2. MDD (major depressive disorder), recurrent episode, moderate (HCC)  F33.1 sertraline (ZOLOFT) 50 MG tablet    clonazePAM (KLONOPIN) 0.5 MG tablet    3. Obsessive-compulsive disorder, unspecified type  F42.9 sertraline (ZOLOFT) 50 MG tablet      Past Psychiatric History: I have reviewed past psychiatric history from progress note on 12/06/2022.  Past trials of medications-Seroquel-multiple others.  Past Medical History:  Past Medical History:  Diagnosis Date   Anemia    H/O   Anxiety    Bronchitis    Chest pain    a. 09/2018 Cath: Nl cors. EF 65%->Med rx (long-acting nitrate added).   Depression    Diastolic dysfunction    a. 10/2018 Echo: EF 55-60%, no rwma, Gr1DD. Mild MR. Nl RV fxn.   DOE (dyspnea on exertion)    Fibromyalgia    GERD (gastroesophageal reflux disease)    Headache    Hyperlipidemia    Hypertension    no meds   Palpitations    a. 01/2019 [facility]): NSR w/ rare PACs & PVCs; b. 04/2020 [facility]: Predominantly sinus rhythm @ 75 (48-141). Rare PACs/PVCs. No significant arrhythmias or prolonged pauses.  No triggered events.   PONV (postoperative nausea and vomiting)     Past Surgical History:  Procedure Laterality Date   ANTERIOR CRUCIATE LIGAMENT REPAIR  CARDIAC CATHETERIZATION     CHOLECYSTECTOMY     CORONARY ANGIOPLASTY     ESOPHAGOGASTRODUODENOSCOPY (EGD) WITH PROPOFOL N/A 08/28/2015   Procedure: ESOPHAGOGASTRODUODENOSCOPY (EGD) WITH PROPOFOL;  Surgeon: Josefine Class, MD;  Location[facility] ENDOSCOPY;  Service: Endoscopy;  Laterality: N/A;   ESOPHAGOGASTRODUODENOSCOPY (EGD) WITH PROPOFOL N/A 01/31/2022   Procedure: ESOPHAGOGASTRODUODENOSCOPY (EGD) WITH PROPOFOL;  Surgeon: [name], MD;  Location: [facility] ENDOSCOPY;  Service: Gastroenterology;  Laterality: N/A;   KNEE SURGERY     left knee    LEFT HEART CATH AND CORONARY ANGIOGRAPHY Left  09/22/2018   Procedure: LEFT HEART CATH AND CORONARY ANGIOGRAPHY;  Surgeon: Nelva Bush, MD;  Location: [facility] CV LAB;  Service: Cardiovascular;  Laterality: Left;   MASS EXCISION Left 01/26/2016   Procedure: EXCISION OF LEFT WRIST VOLAR MASS;  Surgeon: Charlotte Crumb, MD;  Location: [facility];  Service: Orthopedics;  Laterality: Left;   NOSE SURGERY     POLYPECTOMY     TUBAL LIGATION     VIDEO BRONCHOSCOPY N/A 05/29/2015   Procedure: VIDEO BRONCHOSCOPY WITHOUT FLUORO;  Surgeon: Vilinda Boehringer, MD;  Location: [facility] ORS;  Service: Cardiopulmonary;  Laterality: N/A;    Family Psychiatric History: Reviewed family psychiatric history from progress note on 12/06/2022.  Family History:  Family History  Problem Relation Age of Onset   Dementia Mother    Alcohol abuse Mother    COPD Mother    High Cholesterol Mother    Heart failure Mother    Hypertension Mother    Dementia Father    Alcohol abuse Father    Hypertension Father    AAA (abdominal aortic aneurysm) Sister    COPD Sister    High Cholesterol Sister     Social History: Reviewed social history from progress note on 12/06/2022. Social History   Socioeconomic History   Marital status: Married    Spouse name: david   Number of children: 3   Years of education: Not on file   Highest education level: 9th grade  Occupational History   Not on file  Tobacco Use   Smoking status: Never   Smokeless tobacco: Never  Vaping Use   Vaping Use: Never used  Substance and Sexual Activity   Alcohol use: No    Alcohol/week: 0.0 standard drinks of alcohol   Drug use: No   Sexual activity: Yes  Other Topics Concern   Not on file  Social History Narrative   Not on file   Social Determinants of Health   Financial Resource Strain: Low Risk  (10/17/2022)   Overall Financial Resource Strain (CARDIA)    Difficulty of Paying Living Expenses: Not very hard  Food Insecurity: No Food Insecurity (10/17/2022)    Hunger Vital Sign    Worried About Running Out of Food in the Last Year: Never true    Ran Out of Food in the Last Year: Never true  Transportation Needs: No Transportation Needs (10/17/2022)   PRAPARE - Hydrologist (Medical): No    Lack of Transportation (Non-Medical): No  Physical Activity: Sufficiently Active (10/17/2022)   Exercise Vital Sign    Days of Exercise per Week: 7 days    Minutes of Exercise per Session: 40 min  Stress: No Stress Concern Present (10/17/2022)   [place]    Feeling of Stress : Only a little  Social Connections: Socially Isolated (10/17/2022)   Social Connection and Isolation Panel [NHANES]  Frequency of Communication with Friends and Family: Never    Frequency of Social Gatherings with Friends and Family: Never    Attends Religious Services: Never    Marine scientist or Organizations: No    Attends Archivist Meetings: Never    Marital Status: Married    Allergies:  Allergies  Allergen Reactions   Cherry Swelling    Swelling of lips and throat   Shellfish Allergy Shortness Of Breath   Sulfa Antibiotics Shortness Of Breath    Stopped breathing, Swelling of throat   Bee Venom     Swelling of throat   Imdur [Isosorbide Nitrate]     Worsening headaches     Metabolic Disorder Labs: Lab Results  Component Value Date   HGBA1C 5.6 09/05/2022   No results found for: "PROLACTIN" No results found for: "CHOL", "TRIG", "HDL", "CHOLHDL", "VLDL", "LDLCALC" Lab Results  Component Value Date   TSH 2.480 09/05/2022   TSH 3.400 12/19/2021    Therapeutic Level Labs: Lab Results  Component Value Date   LITHIUM 0.77 12/09/2022   LITHIUM 0.5 09/05/2022   No results found for: "VALPROATE" No results found for: "CBMZ"  Current Medications: Current Outpatient Medications  Medication Sig Dispense Refill   albuterol (VENTOLIN HFA) 108 (90  Base) MCG/ACT inhaler Inhale 1-2 puffs into the lungs every 6 (six) hours as needed for wheezing or shortness of breath. 18 g 11   aspirin EC 81 MG tablet Take 81 mg by mouth daily.     budesonide-formoterol (SYMBICORT) 80-4.5 MCG/ACT inhaler Inhale 2 puffs into the lungs 2 (two) times daily. 1 each 12   cephALEXin (KEFLEX) 500 MG capsule Take 1 capsule (500 mg total) by mouth 2 (two) times daily. 20 capsule 0   diltiazem (CARDIZEM CD) 240 MG 24 hr capsule TAKE ONE CAPSULE BY MOUTH DAILY 90 capsule 0   diltiazem (CARDIZEM) 30 MG tablet Take 1 tablet (30 mg total) by mouth daily as needed (palpitations). 90 tablet 0   docusate sodium (COLACE) 100 MG capsule Take 100 mg by mouth 2 (two) times daily.     EPINEPHrine 0.3 mg/0.3 mL IJ SOAJ injection Inject 0.3 mg into the muscle as needed for anaphylaxis.     eszopiclone (LUNESTA) 1 MG TABS tablet Take 1 tablet (1 mg total) by mouth at bedtime. Take immediately before bedtime 15 tablet 0   Fluticasone-Umeclidin-Vilant (TRELEGY ELLIPTA) 200-62.5-25 MCG/ACT AEPB Inhale 1 puff into the lungs daily. 4 each 0   hydrOXYzine (VISTARIL) 25 MG capsule Take 1 capsule (25 mg total) by mouth 3 (three) times daily as needed for anxiety. And sleep 270 capsule 1   ketorolac (TORADOL) 10 MG tablet Take 1 tablet (10 mg total) by mouth every 8 (eight) hours as needed. 20 tablet 0   lactulose (CHRONULAC) 10 GM/15ML solution Take 15 mLs (10 g total) by mouth daily as needed for mild constipation. 236 mL 0   levofloxacin (LEVAQUIN) 500 MG tablet Take 1 tablet (500 mg total) by mouth daily. 7 tablet 0   linaclotide (LINZESS) 290 MCG CAPS capsule Take 1 capsule (290 mcg total) by mouth daily before breakfast. 30 capsule 3   lithium carbonate 300 MG capsule Take 600 mg by mouth at bedtime.     methylPREDNISolone (MEDROL DOSEPAK) 4 MG TBPK tablet Take as directed on package. 1 each 0   mometasone-formoterol (DULERA) 200-5 MCG/ACT AERO Inhale into the lungs.     nitroGLYCERIN  (NITROSTAT) 0.4 MG SL tablet PLACE ONE  TABLET UNDER THE TONGUE EVERY 5 MINUTES AS NEEDED FOR CHEST PAIN. MAXIMUM OF 3 DOSES. 25 tablet 0   omeprazole (PRILOSEC) 40 MG capsule Take 1 capsule (40 mg total) by mouth daily. 90 capsule 1   ondansetron (ZOFRAN ODT) 4 MG disintegrating tablet Take 1 tablet (4 mg total) by mouth every 8 (eight) hours as needed for nausea or vomiting. 12 tablet 0   rosuvastatin (CRESTOR) 20 MG tablet Take 1 tablet (20 mg total) by mouth at bedtime. 90 tablet 0   sertraline (ZOLOFT) 50 MG tablet Take 1 tablet (50 mg total) by mouth daily with breakfast. 30 tablet 1   tiotropium (SPIRIVA) 18 MCG inhalation capsule Place into inhaler and inhale.     tiZANidine (ZANAFLEX) 4 MG tablet Take 1 tablet (4 mg total) by mouth at bedtime. 90 tablet 1   topiramate (TOPAMAX) 50 MG tablet Take 1 tablet (50 mg total) by mouth 2 (two) times daily. 180 tablet 1   clonazePAM (KLONOPIN) 0.5 MG tablet Take 1 tablet (0.5 mg total) by mouth as directed. 21 tablet 0   gabapentin (NEURONTIN) 400 MG capsule Take 400 mg by mouth 3 (three) times daily. (Patient not taking: Reported on 01/22/2023)     No current facility-administered medications for this visit.     Musculoskeletal: Strength & Muscle Tone: within normal limits Gait & Station: normal Patient leans: N/A  Psychiatric Specialty Exam: Review of Systems  Psychiatric/Behavioral:  Positive for dysphoric mood and sleep disturbance. The patient is nervous/anxious.   All other systems reviewed and are negative.   Blood pressure 128/76, pulse 67, temperature (!) 97.5 F (36.4 C), temperature source Skin, height '5\' 2"'$  (1.575 m), weight 148 lb 3.2 oz (67.2 kg), last menstrual period 09/24/2016.Body mass index is 27.11 kg/m.  General Appearance: Casual  Eye Contact:  Good  Speech:  Clear and Coherent  Volume:  Normal  Mood:  Anxious and Depressed  Affect:  Congruent  Thought Process:  Goal Directed and Descriptions of Associations:  Intact  Orientation:  Full (Time, Place, and Person)  Thought Content: Logical   Suicidal Thoughts:  No  Homicidal Thoughts:  No  Memory:  Immediate;   Fair Recent;   Fair Remote;   Fair  Judgement:  Fair  Insight:  Fair  Psychomotor Activity:  Normal  Concentration:  Concentration: Fair and Attention Span: Fair  Recall:  [employer] of Knowledge: Fair  Language: Fair  Akathisia:  No  Handed:  Right  AIMS (if indicated): not done  Assets:  Communication Skills Desire for Improvement Housing Intimacy Social Support  ADL's:  Intact  Cognition: WNL  Sleep:  Poor   Screenings: GAD-7    Physiological scientist Office Visit from 01/22/2023 in [facility] Office Visit from 12/06/2022 in [facility] from 12/02/2022 in [facility] from 10/25/2022 in [facility] Office Visit from 09/05/2022 in [facility]  Total GAD-7 Score '12 12 18 20 10      '$ PHQ2-9    [facility] Office Visit from 01/22/2023 in [facility] Office Visit from 12/10/2022 in [facility] Office Visit from 12/06/2022 in [facility] from 12/02/2022 in [facility] Office Visit from 10/31/2022 in [facility]  PHQ-2 Total Score 2 0 '2 4 3  '$ PHQ-9 Total  Score '7 3 13 13 14      '$ Flowsheet Row Office Visit from 01/22/2023 in [facility] Office Visit from 12/06/2022 in [facility] from [facility]02/2022 in [facility]  C-SSRS RISK CATEGORY No Risk No Risk No Risk        Assessment and Plan: DOSHIA DILLEHAY is a 57 year old  female on disability, lives in [place], has a history of PTSD, MDD, OCD, multiple medical problems including hepatitis, was evaluated in office today.  Patient is currently struggling with anxiety, sleep problems although has noticed some improvement with depression, will benefit from the following plan.  Plan PTSD-unstable Continue CBT-has upcoming appointment with Ms. Alvy Beal Increase sertraline to 50 mg p.o. daily with breakfast Will consider adding prazosin in the future for nightmares. Start Lunesta 1 mg p.o. nightly for sleep Discontinue zolpidem for lack of benefit  MDD-improving Continue sertraline as prescribed Lithium 600 mg p.o. daily at bedtime Patient also has gabapentin prescribed by her primary provider likely for pain.  Patient could consider going back on the gabapentin if she continues to have mood symptoms/anxiety since gabapentin is also a mood stabilizer. Continue CBT I have reviewed and discussed lithium level-12/09/2022-0.77-therapeutic   OCD unspecified-unstable Increase sertraline as noted above Continue Klonopin 0.5 mg-takes 1-3 times a week only for severe anxiety.  Patient picked up a prescription for clonazepam 0.5 mg # 60 tablets on 11/21/2022.  Patient advised to limit use.  Patient will have enough clonazepam for the next several weeks and is currently not due. Patient also has hydroxyzine prescribed by primary care, although prescribed as 25 mg 3 times a day has been taking it only once a day as needed at bedtime. Patient will be free from CBT/exposure response prevention therapy as needed Patient to discuss with therapist. Reviewed [place] PMP AWARxE   Reviewed and discussed urine drug screen-12/09/2022-negative   Follow-up in clinic in 4 weeks or sooner if needed.  Consent: Patient/Guardian gives verbal consent for treatment and assignment of benefits for services provided during this visit. Patient/Guardian expressed understanding and agreed to  proceed.   This note was generated in part or whole with voice recognition software. Voice recognition is usually quite accurate but there are transcription errors that can and very often do occur. I apologize for any typographical errors that were not detected and corrected.      Ursula Alert, MD 01/22/2023, 3:03 PM

## 2023-01-22 NOTE — Patient Instructions (Signed)
Eszopiclone Tablets What is this medication? ESZOPICLONE (es ZOE pi clone) treats insomnia. It helps you go to sleep faster and stay asleep through the night. It is often used for a short period of time. This medicine may be used for other purposes; ask your health care provider or pharmacist if you have questions. COMMON BRAND NAME(S): Lunesta What should I tell my care team before I take this medication? They need to know if you have any of these conditions: Depression Liver disease Lung or breathing disease, such as asthma or COPD Substance use disorder Sleep-walking, driving, eating or other activity while not fully awake after taking a sleep medication Suicidal thoughts, plans, or attempt by you or a family member An unusual or allergic reaction to eszopiclone, other medications, foods, dyes, or preservatives Pregnant or trying to get pregnant Breast-feeding How should I use this medication? Take this medication by mouth with a glass of water. Follow the directions on the prescription label. It is better to take this medication on an empty stomach and only when you are ready for bed. Do not take your medication more often than directed. If you have been taking this medication for several weeks and suddenly stop taking it, you may get unpleasant withdrawal symptoms. Your care team may want to gradually reduce the dose. Do not stop taking this medication on your own. Always follow your care team's advice. A special MedGuide will be given to you by the pharmacist with each prescription and refill. Be sure to read this information carefully each time. Talk to your care team about the use of this medication in children. Special care may be needed. Overdosage: If you think you have taken too much of this medicine contact a poison control center or emergency room at once. NOTE: This medicine is only for you. Do not share this medicine with others. What if I miss a dose? This does not apply. This  medication should only be taken immediately before going to sleep. Do not take double or extra doses. What may interact with this medication? Herbal medications like kava kava, melatonin, St. John's Wort and valerian Lorazepam Medications for fungal infections like ketoconazole, fluconazole, or itraconazole Olanzapine This list may not describe all possible interactions. Give your health care provider a list of all the medicines, herbs, non-prescription drugs, or dietary supplements you use. Also tell them if you smoke, drink alcohol, or use illegal drugs. Some items may interact with your medicine. What should I watch for while using this medication? Visit your care team for regular checks on your progress. Keep a regular sleep schedule by going to bed at about the same time nightly. Avoid caffeine-containing drinks in the evening hours, as caffeine can cause trouble with falling asleep. Talk to your care team if you still have trouble sleeping. You may do unusual sleep behaviors or activities you do not remember the day after taking this medication. Activities include driving, making or eating food, talking on the phone, sexual activity, or sleep walking. Stop taking this medication and call your care team right away if you find out you have done activities like this. Plan to go to bed and stay in bed for a full night (7 to 8 hours) after you take this medication. You may still be drowsy the morning after taking this medication. This medication may affect your coordination, reaction time, or judgment. Do not drive or operate machinery until you know how this medication affects you. Sit up or stand slowly to   reduce the risk of dizzy or fainting spells. If you or your family notice any changes in your behavior, such as new or worsening depression, thoughts of harming yourself, anxiety, other unusual or disturbing thoughts, or memory loss, call your care team right away. After you stop taking this  medication, you may have trouble falling asleep. This is called rebound insomnia. This problem usually goes away on its own after 1 or 2 nights. What side effects may I notice from receiving this medication? Side effects that you should report to your care team as soon as possible: Allergic reactions or angioedema--skin rash, itching, hives, swelling of the face, eyes, lips, tongue, arms, or legs, trouble swallowing or breathing CNS depression--slow or shallow breathing, shortness of breath, feeling faint, dizziness, confusion, trouble staying awake Mood and behavior changes--anxiety, nervousness, confusion, hallucinations, irritability, hostility, thoughts of suicide or self-harm, worsening mood, feelings of depression Unusual sleep behaviors or activities you do not remember such as driving, eating, or sexual activity Side effects that usually do not require medical attention (report to your care team if they continue or are bothersome): Dizziness Drowsiness the day after use Dry mouth Headache Metallic taste in mouth This list may not describe all possible side effects. Call your doctor for medical advice about side effects. You may report side effects to FDA at 1-800-FDA-1088. Where should I keep my medication? Keep out of the reach of children. This medication can be abused. Keep your medication in a safe place to protect it from theft. Do not share this medication with anyone. Selling or giving away this medication is dangerous and against the law. This medication may cause accidental overdose and death if taken by other adults, children, or pets. Mix any unused medication with a substance like cat litter or coffee grounds. Then throw the medication away in a sealed container like a sealed bag or a coffee can with a lid. Do not use the medication after the expiration date. Store at room temperature between 15 and 30 degrees C (59 and 86 degrees F). NOTE: This sheet is a summary. It may not  cover all possible information. If you have questions about this medicine, talk to your doctor, pharmacist, or health care provider.  2023 Elsevier/Gold Standard (2021-06-04 00:00:00)  

## 2023-01-27 ENCOUNTER — Ambulatory Visit (INDEPENDENT_AMBULATORY_CARE_PROVIDER_SITE_OTHER): Payer: PPO | Admitting: Licensed Clinical Social Worker

## 2023-01-27 DIAGNOSIS — F4310 Post-traumatic stress disorder, unspecified: Secondary | ICD-10-CM

## 2023-01-27 DIAGNOSIS — F331 Major depressive disorder, recurrent, moderate: Secondary | ICD-10-CM | POA: Diagnosis not present

## 2023-01-27 DIAGNOSIS — F429 Obsessive-compulsive disorder, unspecified: Secondary | ICD-10-CM

## 2023-01-29 NOTE — Progress Notes (Unsigned)
THERAPIST PROGRESS NOTE  Session Time: 2:00PM-2:58PM  Participation Level: Active  Behavioral Response: Casual, Neat, and Well GroomedAlertDepressed  Type of Therapy: Individual Therapy  Treatment Goals addressed:  Reduce overall frequency, intensity and duration of anxiety so that daily functioning is not impaired per pt self report 3 out of 5 sessions.   Reduce frequency, intensity, and duration of depression symptoms so that daily functioning is improved.  Increase coping skills to manage depression and improve ability to perform daily activities.   Recall traumatic events without becoming overwhelmed with negative emotions.  ProgressTowards Goals: Progressing  Interventions: CBT, DBT, Strength-based, and Other: ACT  Summary: Melinda Shaw is a 57 y.o. female who presents with continued sxs of anxiety and depression as a result of a hx of trauma and current life stressors. Sxs endorsed including but not limited to crying spells, lack of interest, lack of motivation, depressed mood, fatigue, irritability, isolation, worry, difficulty controlling worry, and negative self affect. Pt oriented to person, place, and time. Pt denies SI/HI or A/V hallucinations. Pt was cooperative during visit and was engaged throughout the visit. Pt does not report any other concerns at the time of visit.  Pt reviewed tx plan with LCSW and confirmed that current plan still feels applicable. LCSW answered any questions pt has re: care with LCSW.   Pt provided a brief hx of trauma to provide LCSW insight into what is causing the feeling of being overwhelmed. Pt shared that her parents were addicted to alcohol. Pt shared that she felt deprived of having parents so she "became a parent at 89 and helped raise my siblings and got pregnant." Pt shared that she has not seen her son since she was 15yo. Pt reported that she recently has been learning to let go of shame attached to that experience. Pt shared that  she worked hard to be a better parent to her daughter than she could be for her son. Pt provided insight into consequences of actions she took to protect her daughter and grandson and shared that she feels she did the right things and also feels isolated now that her family does not talk to her.   Pt shared that she did not feel wanted or accepted as a kid and shares that she continues to feel that way. Explored with pt times in life when she did feel confident. Pt reported that she has not felt confident in a long time and continues to criticize self. Discussed self affect and self esteem being essential to setting the tone for how we allow others to treat [place]. Invited pt to explore her blue sky qualities-the qualities of self that are consistent no matter what clouds of accomplishment or failure may be in the sky- to assist pt in grounding self in herself and assisting pt in noting where she has stayed true to herself despite life circumstances.   Pt participated in discussion re: life being a mountain range with multiple peaks, a metaphor utilized to assist pt in pursuing self compassion and hope for future peaks in her life. This metaphor is intended to assist pts in decreasing comparison to past selves and to look forward to value and worth of future self.    Invited pt to keep a log for PTSD triggers to assist LCSW in tracking patterns of triggers in pt's life.   Pt is continuing to apply interventions/techniques learned in session into daily life situations. Pt is currently on track to meet goals utilizing interventions  that are discussed in session. Treatment to continue as indicated. Personal growth and progress toward goals noted above.  LCSW provided mood monitoring and treatment progress review in the context of this episode of treatment. LCSW reviewed the pt's mood status since last session.   Continued Recommendations as followed: Self-care behaviors, positive social engagements, focusing on  positive physical and emotional wellness, and focusing on life/work balance.    Suicidal/Homicidal: Nowithout intent/plan  Therapist Response:  Provided pt education re: acceptance. Discussed how to make acceptance accessible at all parts of pt's healing journey.    Approached pt with strengths based perspective to assist pt in exploring strengths in moments of feeling low.   LCSW practiced active listening to validate pt participation, build rapport, and create safe space for pt to feel heard as they are disclosing their thoughts and feelings.   LCSW utilized therapeutic conversation skills informed by CBT, DBT, and ACT to expose pt to multiple ways of thinking about healing and to provide pt to access to multiple interventions.  Explored with pt how their beliefs re: what they believe about themselves, about something specific, and about what's most important to them impacts their thoughts, feelings and emotions and actions and behavior. Discussed with the pt how a situation or experience that they encounter and how they think about the situation will likely influence how they feel and respond and if they have negative-self talk then the emotional response will more than likely be negative. Explored with the pt that the positive aspect of self-talk is them being able to make choice. Discussed with the pt that they have the choice to interpret the situation and think about the situation in a certain manner. Empowered the pt to take responsibility for their reactions and to challenge negative thoughts to replace those thoughts with positive ones. Provided examples of positive self-talk to include: this shall pass and my life will be better, I am doing the best I can, given my history and level of current awareness, Be honest and true to yourself, I am a worthy and good person, Look how my I have accomplished and the progression I have made and there are no failures only different degrees of success.  Explored in session with the pt how to utilize self-talk that is realistic and uplifting and to become aware when negative self-talk is present and to challenge those negative thoughts.    Plan: Return again in 3 weeks.  Diagnosis: PTSD (post-traumatic stress disorder)  MDD (major depressive disorder), recurrent episode, moderate (HCC)  Obsessive-compulsive disorder, unspecified type    01/22/2023    1:41 PM 12/06/2022   11:41 AM 12/02/2022    2:21 PM 10/25/2022   11:43 AM  GAD 7 : Generalized Anxiety Score  Nervous, Anxious, on Edge 2 2 3 2   Control/stop worrying 1 1 3 3   Worry too much - different things 1 1 3 3   Trouble relaxing 3 3 3 3   Restless 3 0 3 3  Easily annoyed or irritable 2 3 3 3   Afraid - awful might happen 0 2 0 3  Total GAD 7 Score 12 12 18 20        01/27/2023    3:36 PM 01/22/2023    1:41 PM 12/10/2022    2:18 PM  Depression screen PHQ 2/9  Decreased Interest 1 1 0  Down, Depressed, Hopeless 1 1 0  PHQ - 2 Score 2 2 0  Altered sleeping 1 1 0  Tired, decreased energy 1  1 1  Change in appetite 1 2 1   Feeling bad or failure about yourself  0 0 0  Trouble concentrating 1 1 1   Moving slowly or fidgety/restless 0 0 0  Suicidal thoughts 0 0 0  PHQ-9 Score 6 7 3   Difficult doing work/chores   Not difficult at all     Collaboration of Care: Psychiatrist AEB Dr. Shea Evans  Patient/Guardian was advised Release of Information must be obtained prior to any record release in order to collaborate their care with an outside provider. Patient/Guardian was advised if they have not already done so to contact the registration department to sign all necessary forms in order for [place] to release information regarding their care.   Consent: Patient/Guardian gives verbal consent for treatment and assignment of benefits for services provided during this visit. Patient/Guardian expressed understanding and agreed to proceed.   Blair Dolphin, LCSW 01/29/2023

## 2023-02-10 ENCOUNTER — Other Ambulatory Visit: Payer: Self-pay | Admitting: Family Medicine

## 2023-02-10 DIAGNOSIS — K760 Fatty (change of) liver, not elsewhere classified: Secondary | ICD-10-CM

## 2023-02-12 ENCOUNTER — Ambulatory Visit (INDEPENDENT_AMBULATORY_CARE_PROVIDER_SITE_OTHER): Payer: PPO | Admitting: Licensed Clinical Social Worker

## 2023-02-12 DIAGNOSIS — F4310 Post-traumatic stress disorder, unspecified: Secondary | ICD-10-CM | POA: Diagnosis not present

## 2023-02-12 DIAGNOSIS — F331 Major depressive disorder, recurrent, moderate: Secondary | ICD-10-CM

## 2023-02-12 DIAGNOSIS — F429 Obsessive-compulsive disorder, unspecified: Secondary | ICD-10-CM

## 2023-02-12 NOTE — Progress Notes (Signed)
THERAPIST PROGRESS NOTE  Session Time: 2:04PM-3:04PM  Participation Level: Active  Behavioral Response: CasualAlertHopeless  Type of Therapy: Individual Therapy  Treatment Goals addressed:  Reduce frequency, intensity, and duration of depression symptoms so that daily functioning is improved  Increase coping skills to manage depression and improve ability to perform daily activities  Reduce overall frequency, intensity and duration of anxiety so that daily functioning is not impaired per pt self report 3 out of 5 sessions.   Recall traumatic events without becoming overwhelmed with negative emotions  ProgressTowards Goals: Progressing  Interventions: CBT, DBT, Motivational Interviewing, Solution Focused, Strength-based, and Other: ACT  Summary: Melinda Shaw is a 57 y.o. female who presents with with mixed sxs of anxiety and depression due to a hx of trauma and current stressors. Sxs endorsed including but not limited to lack of motivation, lack of interest, fatigue, worry, difficulty controlling worry, irritability, irregular appetite, and negative self affect. Pt oriented to person, place, and time. Pt denies SI/HI or A/V hallucinations. Pt was cooperative during visit and was engaged throughout the visit. Pt does not report any other concerns at the time of visit.  Pt reported noticing that she has had difficulty identifying with her "clouds" more than her "sky." Pt reported working to utilize blue sky metaphor from previous session and set goal to continue to practice seeing self for more than successes or failures.   Pt reported noticing increase irritability and sarcasm. Pt identified sarcasm to be her primary outlet for communication. Discussed how sarcasm is fueled by resentment and serves as a humorous way to present difficult information. Pt reported not feeling heard at home. Provided pt processing space. Pt shared that she primarily uses sarcasm with her husband and  feels bad about herself for doing so. Pt engaged in discussion on re-inviting curiosity into relationship and practiced using "we" statements to assist pt in teaming up with husband to face conflicts.   Pt shared that she has a good marriage and reports desire to have a great marriage. Discussed how she and her husband shares values and also have different ways of applying values to relationship. Discussed love being education and importance of both partners being willing to teach and be teachable. Invited pt to go on dates and begin pursuing how she wants to show up in marriage.   Pt reported noticing improvements in anxious sxs overall. Pt named that her root insecurity supporting her compulsions is fear of being insufficient. Provided pt processing space.  Pt reported noticing that anxiety makes assumptions and shared how she plans to work on using curiosity to combat assumptions.   LCSW provided mood monitoring and treatment progress review in the context of this episode of treatment. LCSW reviewed the pt's mood status since last session.   Pt is continuing to apply interventions/techniques learned in session into daily life situations. Pt is currently on track to meet goals utilizing interventions that are discussed in session. Treatment to continue as indicated. Personal growth and progress toward goals noted above.  Continued Recommendations as followed: Self-care behaviors, positive social engagements, focusing on positive physical and emotional wellness, and focusing on life/work balance.    Suicidal/Homicidal: Nowithout intent/plan  Therapist Response:  Provided pt education re: acceptance. Discussed how to make acceptance accessible at all parts of pt's healing journey.   Approached pt with strengths based perspective to assist pt in exploring strengths in moments of feeling low.   LCSW practiced active listening to validate pt participation, build rapport,  and create safe space for pt  to feel heard as they are disclosing their thoughts and feelings.   LCSW utilized therapeutic conversation skills informed by CBT, DBT, and ACT to expose pt to multiple ways of thinking about healing and to provide pt to access to multiple interventions.  Explored the space between stimulus and response to assist pt in finding space to control emotions and choose his response to the stimulus that risks increasing pt emotional reactivity.    Plan: Return again in 2 weeks.  Diagnosis: PTSD (post-traumatic stress disorder)  MDD (major depressive disorder), recurrent episode, moderate  Obsessive-compulsive disorder, unspecified type    02/12/2023    3:17 PM 01/22/2023    1:41 PM 12/06/2022   11:41 AM 12/02/2022    2:21 PM  GAD 7 : Generalized Anxiety Score  Nervous, Anxious, on Edge 1 2 2 3   Control/stop worrying 0 1 1 3   Worry too much - different things 1 1 1 3   Trouble relaxing 1 3 3 3   Restless 1 3 0 3  Easily annoyed or irritable 1 2 3 3   Afraid - awful might happen 1 0 2 0  Total GAD 7 Score 6 12 12 18        02/12/2023    3:15 PM 01/27/2023    3:36 PM 01/22/2023    1:41 PM  Depression screen PHQ 2/9  Decreased Interest 2 1 1   Down, Depressed, Hopeless 2 1 1   PHQ - 2 Score 4 2 2   Altered sleeping 2 1 1   Tired, decreased energy 2 1 1   Change in appetite 2 1 2   Feeling bad or failure about yourself  1 0 0  Trouble concentrating 2 1 1   Moving slowly or fidgety/restless 3 0 0  Suicidal thoughts 0 0 0  PHQ-9 Score 16 6 7     Collaboration of Care: Psychiatrist AEB Dr. Shea Evans  Patient/Guardian was advised Release of Information must be obtained prior to any record release in order to collaborate their care with an outside provider. Patient/Guardian was advised if they have not already done so to contact the registration department to sign all necessary forms in order for [place] to release information regarding their care.   Consent: Patient/Guardian gives verbal consent for  treatment and assignment of benefits for services provided during this visit. Patient/Guardian expressed understanding and agreed to proceed.   Blair Dolphin, LCSW 02/12/2023

## 2023-02-18 ENCOUNTER — Encounter: Payer: Self-pay | Admitting: Psychiatry

## 2023-02-18 ENCOUNTER — Telehealth (INDEPENDENT_AMBULATORY_CARE_PROVIDER_SITE_OTHER): Payer: PPO | Admitting: Psychiatry

## 2023-02-18 DIAGNOSIS — F3342 Major depressive disorder, recurrent, in full remission: Secondary | ICD-10-CM

## 2023-02-18 DIAGNOSIS — F4310 Post-traumatic stress disorder, unspecified: Secondary | ICD-10-CM

## 2023-02-18 DIAGNOSIS — F429 Obsessive-compulsive disorder, unspecified: Secondary | ICD-10-CM

## 2023-02-18 MED ORDER — SERTRALINE HCL 50 MG PO TABS: 50.0000 mg | ORAL_TABLET | Freq: Every day | ORAL | 0 refills | Status: DC

## 2023-02-18 MED ORDER — ESZOPICLONE 1 MG PO TABS: 1.0000 mg | ORAL_TABLET | Freq: Every day | ORAL | 0 refills | Status: DC

## 2023-02-18 MED ORDER — CLONAZEPAM 0.5 MG PO TABS
0.5000 mg | ORAL_TABLET | ORAL | 1 refills | Status: DC
Start: 2023-02-18 — End: 2023-08-25

## 2023-02-18 NOTE — Progress Notes (Unsigned)
Virtual Visit via Video Note  I connected with Bary CastillaDeborah A Pellegrino on 02/18/23 at 10:30 AM EDT by a video enabled telemedicine application and verified that I am speaking with the correct person using two identifiers.  Location Provider Location : [facility] Patient Location : Home  Participants: Patient , Provider   I discussed the limitations of evaluation and management by telemedicine and the availability of in person appointments. The patient expressed understanding and agreed to proceed.    I discussed the assessment and treatment plan with the patient. The patient was provided an opportunity to ask questions and all were answered. The patient agreed with the plan and demonstrated an understanding of the instructions.   The patient was advised to call back or seek an in-person evaluation if the symptoms worsen or if the condition fails to improve as anticipated.    BH MD OP Progress Note  02/18/2023 10:57 AM Demetrio LappingDeborah A Wortley  MRN:  [id number]  Chief Complaint:  Chief Complaint  Patient presents with   Follow-up   Medication Refill   Anxiety   HPI: Demetrio LappingDeborah A Guardino is a 57 year old female, who lives in [place], married, on disability, has a history of PTSD, MDD, OCD, hypertension, COPD, history of CVA on ASA, fibromyalgia, history of appendiceal neoplasm, history of prior ileocecectomy, recent diagnosis of hepatitis was evaluated by telemedicine today.  Patient today reports since being on the Lunesta 1 mg she has been sleeping better.  She denies side effects to it.  Patient reports overall mood symptoms is improving.  She likes the current combination of medications.  She however reports since being on a higher dosage of sertraline, 50 mg she has noticed sleepiness during the day.  She has been trying hard to push through it.  Patient reports she is currently working with her new therapist Ms. Katie bounds.  She reports therapy sessions are helpful.  She is motivated to stay in  therapy.  Patient denies any suicidality, homicidality or perceptual disturbances.  Currently compliant on all her medications.  Continues to limit the use of clonazepam as needed.  Patient denies any other concerns today.  Visit Diagnosis:    ICD-10-CM   1. PTSD (post-traumatic stress disorder)  F43.10 eszopiclone (LUNESTA) 1 MG TABS tablet    sertraline (ZOLOFT) 50 MG tablet    clonazePAM (KLONOPIN) 0.5 MG tablet    2. Recurrent major depressive disorder, in full remission  F33.42 sertraline (ZOLOFT) 50 MG tablet    clonazePAM (KLONOPIN) 0.5 MG tablet    3. Obsessive-compulsive disorder with good or fair insight  F42.9       Past Psychiatric History: Reviewed past psychiatric history from progress note on 12/06/2022.  Past trials of medications-Seroquel-multiple others.  Past Medical History:  Past Medical History:  Diagnosis Date   Anemia    H/O   Anxiety    Bronchitis    Chest pain    a. 09/2018 Cath: Nl cors. EF 65%->Med rx (long-acting nitrate added).   Depression    Diastolic dysfunction    a. 10/2018 Echo: EF 55-60%, no rwma, Gr1DD. Mild MR. Nl RV fxn.   DOE (dyspnea on exertion)    Fibromyalgia    GERD (gastroesophageal reflux disease)    Headache    Hyperlipidemia    Hypertension    no meds   Palpitations    a. 01/2019 [facility]): NSR w/ rare PACs & PVCs; b. 04/2020 [facility]: Predominantly sinus rhythm @ 75 (48-141). Rare PACs/PVCs. No significant arrhythmias or  prolonged pauses.  No triggered events.   PONV (postoperative nausea and vomiting)     Past Surgical History:  Procedure Laterality Date   ANTERIOR CRUCIATE LIGAMENT REPAIR     CARDIAC CATHETERIZATION     CHOLECYSTECTOMY     CORONARY ANGIOPLASTY     ESOPHAGOGASTRODUODENOSCOPY (EGD) WITH PROPOFOL N/A 08/28/2015   Procedure: ESOPHAGOGASTRODUODENOSCOPY (EGD) WITH PROPOFOL;  Surgeon: Elnita Maxwell, MD;  Location: [facility] ENDOSCOPY;  Service: Endoscopy;  Laterality: N/A;   ESOPHAGOGASTRODUODENOSCOPY  (EGD) WITH PROPOFOL N/A 01/31/2022   Procedure: ESOPHAGOGASTRODUODENOSCOPY (EGD) WITH PROPOFOL;  Surgeon: Wyline Mood, MD;  Location: [facility] ENDOSCOPY;  Service: Gastroenterology;  Laterality: N/A;   KNEE SURGERY     left knee    LEFT HEART CATH AND CORONARY ANGIOGRAPHY Left 09/22/2018   Procedure: LEFT HEART CATH AND CORONARY ANGIOGRAPHY;  Surgeon: [name], MD;  Location: [facility] INVASIVE CV LAB;  Service: Cardiovascular;  Laterality: Left;   MASS EXCISION Left 01/26/2016   Procedure: EXCISION OF LEFT WRIST VOLAR MASS;  Surgeon: Dairl Ponder, MD;  Location: [facility];  Service: Orthopedics;  Laterality: Left;   NOSE SURGERY     POLYPECTOMY     TUBAL LIGATION     VIDEO BRONCHOSCOPY N/A 05/29/2015   Procedure: VIDEO BRONCHOSCOPY WITHOUT FLUORO;  Surgeon: Stephanie Acre, MD;  Location: [facility] ORS;  Service: Cardiopulmonary;  Laterality: N/A;    Family Psychiatric History: Reviewed family psychiatric history from progress note on 12/06/2022.  Family History:  Family History  Problem Relation Age of Onset   Dementia Mother    Alcohol abuse Mother    COPD Mother    High Cholesterol Mother    Heart failure Mother    Hypertension Mother    Dementia Father    Alcohol abuse Father    Hypertension Father    AAA (abdominal aortic aneurysm) Sister    COPD Sister    High Cholesterol Sister     Social History: Reviewed social history from progress note on 12/06/2022. Social History   Socioeconomic History   Marital status: Married    Spouse name: david   Number of children: 3   Years of education: Not on file   Highest education level: 9th grade  Occupational History   Not on file  Tobacco Use   Smoking status: Never   Smokeless tobacco: Never  Vaping Use   Vaping Use: Never used  Substance and Sexual Activity   Alcohol use: No    Alcohol/week: 0.0 standard drinks of alcohol   Drug use: No   Sexual activity: Yes  Other Topics Concern   Not on file  Social  History Narrative   Not on file   Social Determinants of Health   Financial Resource Strain: Low Risk  (10/17/2022)   Overall Financial Resource Strain (CARDIA)    Difficulty of Paying Living Expenses: Not very hard  Food Insecurity: No Food Insecurity (10/17/2022)   Hunger Vital Sign    Worried About Running Out of Food in the Last Year: Never true    Ran Out of Food in the Last Year: Never true  Transportation Needs: No Transportation Needs (10/17/2022)   PRAPARE - Administrator, Civil Service (Medical): No    Lack of Transportation (Non-Medical): No  Physical Activity: Sufficiently Active (10/17/2022)   Exercise Vital Sign    Days of Exercise per Week: 7 days    Minutes of Exercise per Session: 40 min  Stress: No Stress Concern Present (10/17/2022)  [employer] of Occupational Health - Occupational Stress Questionnaire    Feeling of Stress : Only a little  Social Connections: Socially Isolated (10/17/2022)   Social Connection and Isolation Panel [NHANES]    Frequency of Communication with Friends and Family: Never    Frequency of Social Gatherings with Friends and Family: Never    Attends Religious Services: Never    Database administrator or Organizations: No    Attends Banker Meetings: Never    Marital Status: Married    Allergies:  Allergies  Allergen Reactions   Cherry Swelling    Swelling of lips and throat   Shellfish Allergy Shortness Of Breath   Sulfa Antibiotics Shortness Of Breath    Stopped breathing, Swelling of throat   Bee Venom     Swelling of throat   Imdur [Isosorbide Nitrate]     Worsening headaches     Metabolic Disorder Labs: Lab Results  Component Value Date   HGBA1C 5.6 09/05/2022   No results found for: "PROLACTIN" No results found for: "CHOL", "TRIG", "HDL", "CHOLHDL", "VLDL", "LDLCALC" Lab Results  Component Value Date   TSH 2.480 09/05/2022   TSH 3.400 12/19/2021    Therapeutic Level Labs: Lab  Results  Component Value Date   LITHIUM 0.77 12/09/2022   LITHIUM 0.5 09/05/2022   No results found for: "VALPROATE" No results found for: "CBMZ"  Current Medications: Current Outpatient Medications  Medication Sig Dispense Refill   albuterol (VENTOLIN HFA) 108 (90 Base) MCG/ACT inhaler Inhale 1-2 puffs into the lungs every 6 (six) hours as needed for wheezing or shortness of breath. 18 g 11   aspirin EC 81 MG tablet Take 81 mg by mouth daily.     budesonide-formoterol (SYMBICORT) 80-4.5 MCG/ACT inhaler Inhale 2 puffs into the lungs 2 (two) times daily. 1 each 12   diltiazem (CARDIZEM CD) 240 MG 24 hr capsule TAKE ONE CAPSULE BY MOUTH DAILY 90 capsule 0   diltiazem (CARDIZEM) 30 MG tablet Take 1 tablet (30 mg total) by mouth daily as needed (palpitations). 90 tablet 0   docusate sodium (COLACE) 100 MG capsule Take 100 mg by mouth 2 (two) times daily.     EPINEPHrine 0.3 mg/0.3 mL IJ SOAJ injection Inject 0.3 mg into the muscle as needed for anaphylaxis.     gabapentin (NEURONTIN) 400 MG capsule Take 400 mg by mouth 3 (three) times daily.     hydrOXYzine (VISTARIL) 25 MG capsule Take 1 capsule (25 mg total) by mouth 3 (three) times daily as needed for anxiety. And sleep 270 capsule 1   lactulose (CHRONULAC) 10 GM/15ML solution TAKE 15 MLS BY MOUTH DAILY AS NEEDED FOR MILD CONSTIPATION 236 mL 0   linaclotide (LINZESS) 290 MCG CAPS capsule Take 1 capsule (290 mcg total) by mouth daily before breakfast. 30 capsule 3   lithium carbonate 300 MG capsule Take 600 mg by mouth at bedtime.     mometasone-formoterol (DULERA) 200-5 MCG/ACT AERO Inhale into the lungs.     nitroGLYCERIN (NITROSTAT) 0.4 MG SL tablet PLACE ONE TABLET UNDER THE TONGUE EVERY 5 MINUTES AS NEEDED FOR CHEST PAIN. MAXIMUM OF 3 DOSES. 25 tablet 0   omeprazole (PRILOSEC) 40 MG capsule Take 1 capsule (40 mg total) by mouth daily. 90 capsule 1   ondansetron (ZOFRAN ODT) 4 MG disintegrating tablet Take 1 tablet (4 mg total) by mouth  every 8 (eight) hours as needed for nausea or vomiting. 12 tablet 0   rosuvastatin (CRESTOR) 20 MG  tablet Take 1 tablet (20 mg total) by mouth at bedtime. 90 tablet 0   tiotropium (SPIRIVA) 18 MCG inhalation capsule Place into inhaler and inhale.     tiZANidine (ZANAFLEX) 4 MG tablet Take 1 tablet (4 mg total) by mouth at bedtime. 90 tablet 1   topiramate (TOPAMAX) 50 MG tablet Take 1 tablet (50 mg total) by mouth 2 (two) times daily. 180 tablet 1   cephALEXin (KEFLEX) 500 MG capsule Take 1 capsule (500 mg total) by mouth 2 (two) times daily. (Patient not taking: Reported on 02/18/2023) 20 capsule 0   clonazePAM (KLONOPIN) 0.5 MG tablet Take 1 tablet (0.5 mg total) by mouth as directed. Take 1 tablet three times a week only for severe anxiety attacks 21 tablet 1   eszopiclone (LUNESTA) 1 MG TABS tablet Take 1 tablet (1 mg total) by mouth at bedtime. Take immediately before bedtime 90 tablet 0   Fluticasone-Umeclidin-Vilant (TRELEGY ELLIPTA) 200-62.5-25 MCG/ACT AEPB Inhale 1 puff into the lungs daily. (Patient not taking: Reported on 02/18/2023) 4 each 0   ketorolac (TORADOL) 10 MG tablet Take 1 tablet (10 mg total) by mouth every 8 (eight) hours as needed. (Patient not taking: Reported on 02/18/2023) 20 tablet 0   sertraline (ZOLOFT) 50 MG tablet Take 1 tablet (50 mg total) by mouth daily. 90 tablet 0   No current facility-administered medications for this visit.     Musculoskeletal: Strength & Muscle Tone:  UTA Gait & Station:  Seated Patient leans: N/A  Psychiatric Specialty Exam: Review of Systems  Psychiatric/Behavioral:  The patient is nervous/anxious.   All other systems reviewed and are negative.   Last menstrual period 09/24/2016.There is no height or weight on file to calculate BMI.  General Appearance: Casual  Eye Contact:  Fair  Speech:  Clear and Coherent  Volume:  Normal  Mood:  Anxious  Affect:  Appropriate  Thought Process:  Goal Directed and Descriptions of Associations:  Intact  Orientation:  Full (Time, Place, and Person)  Thought Content: Logical   Suicidal Thoughts:  No  Homicidal Thoughts:  No  Memory:  Immediate;   Fair Recent;   Fair Remote;   Fair  Judgement:  Fair  Insight:  Fair  Psychomotor Activity:  Normal  Concentration:  Concentration: Fair and Attention Span: Fair  Recall:  [employer] of Knowledge: Fair  Language: Fair  Akathisia:  No  Handed:  Right  AIMS (if indicated): not done  Assets:  Communication Skills Desire for Improvement Housing Social Support  ADL's:  Intact  Cognition: WNL  Sleep:   improving   Screenings: GAD-7    Flowsheet Row Video Visit from 02/18/2023 in [facility] Counselor from 02/12/2023 in [facility] Office Visit from 01/22/2023 in [facility] Office Visit from 12/06/2022 in [facility] Counselor from 12/02/2022 in [facility]  Total GAD-7 Score 9 6 12 12 18       PHQ2-9    Flowsheet Row Video Visit from 02/18/2023 in [facility] Counselor from 02/12/2023 in [facility] Counselor from 01/27/2023 in [facility] Office Visit from 01/22/2023 in [facility] Office Visit from 12/10/2022 in [facility]  PHQ-2 Total Score 0 4 2 2  0  PHQ-9 Total Score -- 16 6 7  3  Flowsheet Row Video Visit from 02/18/2023 in [facility] Counselor from 02/12/2023 in [facility] Counselor from 01/27/2023 in [facility]  C-SSRS RISK CATEGORY No Risk No Risk No Risk        Assessment and Plan: BRAILYN KILLION is a 57 year old  female on disability, lives in [place], has a history of PTSD, MDD, OCD, multiple medical problems including hepatitis was evaluated by telemedicine today.  Patient is currently improving, plan as noted below.  Plan PTSD-improving Continue trauma focused therapy-with Ms. Florentina Addison bounds. Discussed with patient to start taking sertraline 50 mg p.o. daily in the evening with a snack.  She could also divide the sertraline into 25 mg twice daily since she has drowsiness on the sertraline. Continue Lunesta 1 mg p.o. nightly for sleep. Reviewed [place] PMP AWARxE   MDD in remission Lithium 600 mg p.o. daily at bedtime Sertraline 50 mg p.o. daily Lithium level-12/09/2022-0.77-therapeutic Continue CBT Patient is also currently on gabapentin-prescribed by her provider for pain.  Gabapentin is also a mood stabilizer.  OCD-improving Klonopin 0.5 mg-1-3 times a week only for severe anxiety attacks.  Patient advised to limit use Hydroxyzine 25 mg p.o. 3 times daily as needed Continue CBT/will benefit from exposure and response prevention. Continue sertraline 50 mg daily Reviewed [place] PMP AWARxE    Follow-up in clinic in 2 to 3 months or sooner if needed.  Collaboration of Care: Collaboration of Care: Other I have reviewed notes per Ms. Katie bounds-01/27/2023, 02/12/2023-I have coordinated care.  Patient/Guardian was advised Release of Information must be obtained prior to any record release in order to collaborate their care with an outside provider. Patient/Guardian was advised if they have not already done so to contact the registration department to sign all necessary forms in order for [place] to release information regarding their care.   Consent: Patient/Guardian gives verbal consent for treatment and assignment of benefits for services provided during this visit. Patient/Guardian expressed understanding and agreed to proceed.    Jomarie Longs, MD 02/18/2023, 10:57 AM

## 2023-02-25 ENCOUNTER — Other Ambulatory Visit: Payer: Self-pay | Admitting: Family Medicine

## 2023-02-25 DIAGNOSIS — Z1231 Encounter for screening mammogram for malignant neoplasm of breast: Secondary | ICD-10-CM

## 2023-02-26 ENCOUNTER — Ambulatory Visit: Payer: PPO | Admitting: Licensed Clinical Social Worker

## 2023-03-11 ENCOUNTER — Telehealth: Payer: Self-pay

## 2023-03-11 NOTE — Telephone Encounter (Signed)
Called and left a message for call back  

## 2023-03-11 NOTE — Telephone Encounter (Signed)
Yes, Dr Tobi Bastos did her EGD in 2023, she can be seen by Inetta Fermo for luminal stuff She is seeing hepatologist in May  RV

## 2023-03-11 NOTE — Telephone Encounter (Signed)
Made appointment with Melinda Shaw for 03/18/2023

## 2023-03-11 NOTE — Telephone Encounter (Signed)
Patient is calling to schedule a appointment but patient is a establish patient with [facility] and has appointment with them on 03/28/2023. Patient states that Dr. Tobi Bastos referred her to [facility] for her liver but now is having dysphagia and is wanting to know if you will see her for the dysphagia. I told her a in office visit you were book out till August but if He okay the visit we can get her in with our PA next week. Please advise if we can see patient for dysphagia

## 2023-03-12 ENCOUNTER — Ambulatory Visit: Payer: PPO | Admitting: Licensed Clinical Social Worker

## 2023-03-17 ENCOUNTER — Ambulatory Visit: Payer: PPO | Admitting: Licensed Clinical Social Worker

## 2023-03-18 ENCOUNTER — Ambulatory Visit (INDEPENDENT_AMBULATORY_CARE_PROVIDER_SITE_OTHER): Payer: PPO | Admitting: Physician Assistant

## 2023-03-18 ENCOUNTER — Encounter: Payer: Self-pay | Admitting: Physician Assistant

## 2023-03-18 VITALS — BP 135/78 | HR 60 | Temp 97.7°F | Ht 66.0 in | Wt 147.0 lb

## 2023-03-18 DIAGNOSIS — K5900 Constipation, unspecified: Secondary | ICD-10-CM

## 2023-03-18 DIAGNOSIS — R140 Abdominal distension (gaseous): Secondary | ICD-10-CM | POA: Diagnosis not present

## 2023-03-18 DIAGNOSIS — K7460 Unspecified cirrhosis of liver: Secondary | ICD-10-CM | POA: Diagnosis not present

## 2023-03-18 DIAGNOSIS — Z86010 Personal history of colonic polyps: Secondary | ICD-10-CM

## 2023-03-18 MED ORDER — LUBIPROSTONE 24 MCG PO CAPS
24.0000 ug | ORAL_CAPSULE | Freq: Two times a day (BID) | ORAL | 3 refills | Status: DC
Start: 2023-03-18 — End: 2023-04-18

## 2023-03-18 NOTE — Patient Instructions (Signed)

## 2023-03-18 NOTE — Progress Notes (Signed)
Celso Amy, PA-C [address]  [address]  [place], [place] [address]  Main: [phone]  Fax: [phone]   Gastroenterology Consultation  Referring Provider:     Jacky Kindle, FNP Primary Care Physician:  Jacky Kindle, FNP Primary Gastroenterologist:  Dr. Wyline Mood  Reason for Consultation:     Abdominal Pain, Constipation, history of Cirrhosis / NAFLD        HPI:   Melinda Shaw is a 57 y.o. y/o female referred for consultation & management  by Jacky Kindle, FNP to evaluate abdominal pain.  Patient requested appt./ referral.  Has history of chronic abdominal pain, bloating, constipation and IBS for many years.  History of cirrhosis due to NAFLD and has been referred to [facility] hepatologist for follow-up.  Has appointment with [facility] hepatologist Dr. Ruffin Frederick 03/28/2023.  Last EGD 01/2022 by Dr. Tobi Bastos was normal.  No evidence of esophageal varices or stricture.  Repeat EGD recommended in 3 years to screen for varices.  Colonoscopy 03/2021 (done at [facility]) showed  prior end-to-side ileocolonic anastomosis in the ascending colon diverticulosis and 2 subcentimeter polyps were resected.  Normal biopsies of the colon and the polyp was a tubular adenoma. Repeat in 5 years.  Patient states she is having generalized bilateral abdominal cramping, bloating, and increased constipation.  Has a lot of gas.  Abdomen is sore to touch.  Has a bowel movement once per week with hard stools and straining.  History of chronic constipation all of her life.  She was on Linzess 290 which worked well initially, however she discontinued it due to increased gas and cramping.  She has also tried lactulose and MiraLAX in the past with little benefit.  She has not tried Event organiser.  She denies alarm symptoms such as rectal bleeding.  MRI /MRCP abdomen with and without contrast 02/2022 showed no liver masses.  Previous cholecystectomy.  Normal pancreas.    Past Medical History:  Diagnosis Date    Anemia    H/O   Anxiety    Bronchitis    Chest pain    a. 09/2018 Cath: Nl cors. EF 65%->Med rx (long-acting nitrate added).   Depression    Diastolic dysfunction    a. 10/2018 Echo: EF 55-60%, no rwma, Gr1DD. Mild MR. Nl RV fxn.   DOE (dyspnea on exertion)    Fibromyalgia    GERD (gastroesophageal reflux disease)    Headache    Hyperlipidemia    Hypertension    no meds   Palpitations    a. 01/2019 [facility]): NSR w/ rare PACs & PVCs; b. 04/2020 [facility]: Predominantly sinus rhythm @ 75 (48-141). Rare PACs/PVCs. No significant arrhythmias or prolonged pauses.  No triggered events.   PONV (postoperative nausea and vomiting)     Past Surgical History:  Procedure Laterality Date   ANTERIOR CRUCIATE LIGAMENT REPAIR     CARDIAC CATHETERIZATION     CHOLECYSTECTOMY     CORONARY ANGIOPLASTY     ESOPHAGOGASTRODUODENOSCOPY (EGD) WITH PROPOFOL N/A 08/28/2015   Procedure: ESOPHAGOGASTRODUODENOSCOPY (EGD) WITH PROPOFOL;  Surgeon: Elnita Maxwell, MD;  Location: [facility] ENDOSCOPY;  Service: Endoscopy;  Laterality: N/A;   ESOPHAGOGASTRODUODENOSCOPY (EGD) WITH PROPOFOL N/A 01/31/2022   Procedure: ESOPHAGOGASTRODUODENOSCOPY (EGD) WITH PROPOFOL;  Surgeon: Wyline Mood, MD;  Location: [facility] ENDOSCOPY;  Service: Gastroenterology;  Laterality: N/A;   KNEE SURGERY     left knee    LEFT HEART CATH AND CORONARY ANGIOGRAPHY Left 09/22/2018   Procedure: LEFT HEART CATH  AND CORONARY ANGIOGRAPHY;  Surgeon: [name], MD;  Location: [facility] INVASIVE CV LAB;  Service: Cardiovascular;  Laterality: Left;   MASS EXCISION Left 01/26/2016   Procedure: EXCISION OF LEFT WRIST VOLAR MASS;  Surgeon: Dairl Ponder, MD;  Location: [facility];  Service: Orthopedics;  Laterality: Left;   NOSE SURGERY     POLYPECTOMY     TUBAL LIGATION     VIDEO BRONCHOSCOPY N/A 05/29/2015   Procedure: VIDEO BRONCHOSCOPY WITHOUT FLUORO;  Surgeon: Stephanie Acre, MD;  Location: [facility] ORS;  Service: Cardiopulmonary;  Laterality:  N/A;    Prior to Admission medications   Medication Sig Start Date End Date Taking? Authorizing Provider  albuterol (VENTOLIN HFA) 108 (90 Base) MCG/ACT inhaler Inhale 1-2 puffs into the lungs every 6 (six) hours as needed for wheezing or shortness of breath. 10/31/22   Jacky Kindle, FNP  aspirin EC 81 MG tablet Take 81 mg by mouth daily.    [provider]  budesonide-formoterol (SYMBICORT) 80-4.5 MCG/ACT inhaler Inhale 2 puffs into the lungs 2 (two) times daily. 10/17/22   Raechel Chute, MD  cephALEXin (KEFLEX) 500 MG capsule Take 1 capsule (500 mg total) by mouth 2 (two) times daily. Patient not taking: Reported on 02/18/2023 12/10/22   Jacky Kindle, FNP  clonazePAM (KLONOPIN) 0.5 MG tablet Take 1 tablet (0.5 mg total) by mouth as directed. Take 1 tablet three times a week only for severe anxiety attacks 02/18/23 04/19/23  Jomarie Longs, MD  diltiazem (CARDIZEM CD) 240 MG 24 hr capsule TAKE ONE CAPSULE BY MOUTH DAILY 02/22/22   End, Cristal Deer, MD  diltiazem (CARDIZEM) 30 MG tablet Take 1 tablet (30 mg total) by mouth daily as needed (palpitations). 11/22/21   End, Cristal Deer, MD  docusate sodium (COLACE) 100 MG capsule Take 100 mg by mouth 2 (two) times daily.    [provider]  EPINEPHrine 0.3 mg/0.3 mL IJ SOAJ injection Inject 0.3 mg into the muscle as needed for anaphylaxis.    [provider]  eszopiclone (LUNESTA) 1 MG TABS tablet Take 1 tablet (1 mg total) by mouth at bedtime. Take immediately before bedtime 02/18/23   Jomarie Longs, MD  Fluticasone-Umeclidin-Vilant (TRELEGY ELLIPTA) 200-62.5-25 MCG/ACT AEPB Inhale 1 puff into the lungs daily. Patient not taking: Reported on 02/18/2023 10/31/22   Jacky Kindle, FNP  gabapentin (NEURONTIN) 400 MG capsule Take 400 mg by mouth 3 (three) times daily. 02/05/20   [provider]  hydrOXYzine (VISTARIL) 25 MG capsule Take 1 capsule (25 mg total) by mouth 3 (three) times daily as needed for anxiety. And sleep  12/10/22   Jacky Kindle, FNP  ketorolac (TORADOL) 10 MG tablet Take 1 tablet (10 mg total) by mouth every 8 (eight) hours as needed. Patient not taking: Reported on 02/18/2023 10/16/22   Jacky Kindle, FNP  lactulose (CHRONULAC) 10 GM/15ML solution TAKE 15 MLS BY MOUTH DAILY AS NEEDED FOR MILD CONSTIPATION 02/10/23   Jacky Kindle, FNP  linaclotide [facility]) 290 MCG CAPS capsule Take 1 capsule (290 mcg total) by mouth daily before breakfast. 01/10/22   Wyline Mood, MD  lithium carbonate 300 MG capsule Take 600 mg by mouth at bedtime.    [provider]  mometasone-formoterol (DULERA) 200-5 MCG/ACT AERO Inhale into the lungs. 05/29/15   [provider]  nitroGLYCERIN (NITROSTAT) 0.4 MG SL tablet PLACE ONE TABLET UNDER THE TONGUE EVERY 5 MINUTES AS NEEDED FOR CHEST PAIN. MAXIMUM OF 3 DOSES. 11/22/21   End, Cristal Deer, MD  omeprazole (PRILOSEC) 40 MG capsule Take 1 capsule (40 mg total) by mouth daily. 12/10/22   Jacky Kindle, FNP  ondansetron (ZOFRAN ODT) 4 MG disintegrating tablet Take 1 tablet (4 mg total) by mouth every 8 (eight) hours as needed for nausea or vomiting. 05/21/21   Chesley Noon, MD  rosuvastatin (CRESTOR) 20 MG tablet Take 1 tablet (20 mg total) by mouth at bedtime. 11/22/21   End, Cristal Deer, MD  sertraline (ZOLOFT) 50 MG tablet Take 1 tablet (50 mg total) by mouth daily. 02/18/23   Jomarie Longs, MD  tiotropium (SPIRIVA) 18 MCG inhalation capsule Place into inhaler and inhale.    [provider]  tiZANidine (ZANAFLEX) 4 MG tablet Take 1 tablet (4 mg total) by mouth at bedtime. 12/10/22   Jacky Kindle, FNP  topiramate (TOPAMAX) 50 MG tablet Take 1 tablet (50 mg total) by mouth 2 (two) times daily. 12/10/22   Jacky Kindle, FNP    Family History  Problem Relation Age of Onset   Dementia Mother    Alcohol abuse Mother    COPD Mother    High Cholesterol Mother    Heart failure Mother    Hypertension Mother    Dementia Father    Alcohol abuse Father     Hypertension Father    AAA (abdominal aortic aneurysm) Sister    COPD Sister    High Cholesterol Sister      Social History   Tobacco Use   Smoking status: Never   Smokeless tobacco: Never  Vaping Use   Vaping Use: Never used  Substance Use Topics   Alcohol use: No    Alcohol/week: 0.0 standard drinks of alcohol   Drug use: No    Allergies as of 03/18/2023 - Review Complete 03/18/2023  Allergen Reaction Noted   Cherry Swelling 02/22/2015   Other Shortness Of Breath 05/12/2014   Shellfish allergy Shortness Of Breath 05/25/2015   Sulfa antibiotics Shortness Of Breath 02/22/2015   Bee venom  02/22/2015   Imdur [isosorbide nitrate]  12/31/2019    Review of Systems:    All systems reviewed and negative except where noted in HPI.   Physical Exam:  BP 135/78   Pulse 60   Temp 97.7 F (36.5 C)   Ht 5\' 6"  (1.676 m)   Wt 147 lb (66.7 kg)   LMP 09/24/2016 (Approximate)   BMI 23.73 kg/m  Patient's last menstrual period was 09/24/2016 (approximate). Psych:  Alert and cooperative. Normal mood and affect. General:   Alert,  Well-developed, well-nourished, pleasant and cooperative in NAD Head:  Normocephalic and atraumatic. Eyes:  Sclera clear, no icterus.   Conjunctiva pink. Lungs:  Respirations even and unlabored.  Clear throughout to auscultation.   No wheezes, crackles, or rhonchi. No acute distress. Heart:  Regular rate and rhythm; no murmurs, clicks, rubs, or gallops. Abdomen:  Normal bowel sounds.  No bruits.  Soft, non-distended without masses, hepatosplenomegaly or hernias noted.  Mild generalized bilateral abdominal tenderness.  No guarding or rebound tenderness.   No Ascites. Neurologic:  Alert and oriented x3;  grossly normal neurologically. Psych:  Alert and cooperative. Normal mood and affect.  Imaging Studies: No results found.  Assessment and Plan:   ANELYS DURO is a 57 y.o. y/o female has been self-referred for Abdominal Pain and Constipation.  She  has history of chronic constipation and irritable bowel syndrome with constipation.  Linzess 290 worked initially, however she discontinued because it was causing increased abdominal cramping.  I  am switching her to Amitiza to see if this will work better.  Chronic Constipation with Bloating / IBS-C  Start Amitiza 1 capsule twice daily with Food.  She stopped Linzess 290 which was causing increased abdominal cramping.  Discussed constipation treatment at length. Recommend High Fiber diet with fruits, vegetables, and whole grains. Drink 64 ounces of Fluids Daily.  If Amitiza does not work, then consider trial of Trulance, Ibsrella, or re-starting Lactulose.  Cirrhosis / NAFLD  Keep f/u at [facility] with Dr. Ruffin Frederick Hepatologist on 03/28/23 as scheduled.  History of Adenomatous Colon Polyp  5 year repeat colonoscopy will be due 03/2026.  Follow up in 4-6 weeks f/u Constipation and Abd Pain.  Celso Amy, PA-C

## 2023-03-20 ENCOUNTER — Encounter: Payer: Self-pay | Admitting: Physician Assistant

## 2023-03-20 MED ORDER — LACTULOSE 20 GM/30ML PO SOLN: 30.0000 mL | Freq: Two times a day (BID) | ORAL | 2 refills | Status: DC

## 2023-03-21 ENCOUNTER — Ambulatory Visit
Admission: RE | Admit: 2023-03-21 | Discharge: 2023-03-21 | Disposition: A | Discharge status: Home / Self Care | Payer: PPO | Source: Ambulatory Visit | Attending: Family Medicine | Admitting: Family Medicine

## 2023-03-21 DIAGNOSIS — Z1231 Encounter for screening mammogram for malignant neoplasm of breast: Secondary | ICD-10-CM

## 2023-03-24 NOTE — Progress Notes (Signed)
Hi Saydie  Normal mammogram; repeat in 1 year.  Please let us know if you have any questions.  Thank you,  Delando Satter, FNP 

## 2023-03-26 ENCOUNTER — Ambulatory Visit: Payer: PPO | Admitting: Licensed Clinical Social Worker

## 2023-04-04 ENCOUNTER — Ambulatory Visit (INDEPENDENT_AMBULATORY_CARE_PROVIDER_SITE_OTHER): Payer: PPO | Admitting: Physician Assistant

## 2023-04-04 ENCOUNTER — Ambulatory Visit: Payer: Self-pay | Admitting: *Deleted

## 2023-04-04 VITALS — BP 139/72 | HR 70 | Temp 98.2°F | Resp 16 | Ht 60.0 in | Wt 145.6 lb

## 2023-04-04 DIAGNOSIS — L209 Atopic dermatitis, unspecified: Secondary | ICD-10-CM | POA: Diagnosis not present

## 2023-04-04 MED ORDER — TRIAMCINOLONE ACETONIDE 0.1 % EX CREA
1.0000 | TOPICAL_CREAM | Freq: Two times a day (BID) | CUTANEOUS | 0 refills | Status: DC
Start: 2023-04-04 — End: 2023-06-13

## 2023-04-04 MED ORDER — PREDNISONE 10 MG PO TABS
ORAL_TABLET | ORAL | 0 refills | Status: AC
Start: 2023-04-04 — End: 2023-04-10
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
## 2023-04-04 NOTE — Progress Notes (Signed)
I,Joseline E Rosas,acting as a scribe for [employer], PA-C.,have documented all relevant documentation on the behalf of Alfredia Ferguson, PA-C,as directed by  Alfredia Ferguson, PA-C while in the presence of Alfredia Ferguson, PA-C.   Established patient visit   Patient: Melinda Shaw   DOB: 10-22-1966   57 y.o. Female  MRN: [id number] Visit Date: 04/04/2023  Today's healthcare provider: Alfredia Ferguson, PA-C   Chief Complaint  Patient presents with   Rash   Subjective    Rash This is a new problem. Episode onset: Wednesday night. The problem is unchanged. The affected locations include the left arm and right arm (around eye area is ithing). The rash is characterized by redness and itchiness. It is unknown if there was an exposure to a precipitant. Associated symptoms include congestion, joint pain and rhinorrhea. Pertinent negatives include no cough, eye pain, shortness of breath or sore throat. Past treatments include antihistamine (Vysine eye drops,Benadryl, calamine lotion and liquid benadryl topital, ice compress).    Medications: Outpatient Medications Prior to Visit  Medication Sig   albuterol (VENTOLIN HFA) 108 (90 Base) MCG/ACT inhaler Inhale 1-2 puffs into the lungs every 6 (six) hours as needed for wheezing or shortness of breath.   aspirin EC 81 MG tablet Take 81 mg by mouth daily.   clonazePAM (KLONOPIN) 0.5 MG tablet Take 1 tablet (0.5 mg total) by mouth as directed. Take 1 tablet three times a week only for severe anxiety attacks   diltiazem (CARDIZEM CD) 240 MG 24 hr capsule TAKE ONE CAPSULE BY MOUTH DAILY   diltiazem (CARDIZEM) 30 MG tablet Take 1 tablet (30 mg total) by mouth daily as needed (palpitations).   docusate sodium (COLACE) 100 MG capsule Take 100 mg by mouth 2 (two) times daily.   EPINEPHrine 0.3 mg/0.3 mL IJ SOAJ injection Inject 0.3 mg into the muscle as needed for anaphylaxis.   eszopiclone (LUNESTA) 1 MG TABS tablet Take 1 tablet (1 mg total) by mouth  at bedtime. Take immediately before bedtime   gabapentin (NEURONTIN) 400 MG capsule Take 400 mg by mouth 3 (three) times daily.   hydrOXYzine (VISTARIL) 25 MG capsule Take 1 capsule (25 mg total) by mouth 3 (three) times daily as needed for anxiety. And sleep   Lactulose 20 GM/30ML SOLN Take 30 mLs (20 g total) by mouth in the morning and at bedtime.   lithium carbonate 300 MG capsule Take 600 mg by mouth at bedtime.   lubiprostone (AMITIZA) 24 MCG capsule Take 1 capsule (24 mcg total) by mouth 2 (two) times daily with a meal.   mometasone-formoterol (DULERA) 200-5 MCG/ACT AERO Inhale into the lungs.   nitroGLYCERIN (NITROSTAT) 0.4 MG SL tablet PLACE ONE TABLET UNDER THE TONGUE EVERY 5 MINUTES AS NEEDED FOR CHEST PAIN. MAXIMUM OF 3 DOSES.   omeprazole (PRILOSEC) 40 MG capsule Take 1 capsule (40 mg total) by mouth daily.   ondansetron (ZOFRAN ODT) 4 MG disintegrating tablet Take 1 tablet (4 mg total) by mouth every 8 (eight) hours as needed for nausea or vomiting.   rosuvastatin (CRESTOR) 20 MG tablet Take 1 tablet (20 mg total) by mouth at bedtime.   sertraline (ZOLOFT) 50 MG tablet Take 1 tablet (50 mg total) by mouth daily.   tiotropium (SPIRIVA) 18 MCG inhalation capsule Place into inhaler and inhale.   tiZANidine (ZANAFLEX) 4 MG tablet Take 1 tablet (4 mg total) by mouth at bedtime.   No facility-administered medications prior to visit.    Review  of Systems  HENT:  Positive for congestion and rhinorrhea. Negative for sore throat.   Eyes:  Negative for pain.  Respiratory:  Negative for cough and shortness of breath.   Musculoskeletal:  Positive for joint pain.  Skin:  Positive for rash.     Objective    BP 139/72 (BP Location: Left Arm, Patient Position: Sitting, Cuff Size: Normal)   Pulse 70   Temp 98.2 F (36.8 C) (Oral)   Resp 16   Ht 5' (1.524 m)   Wt 145 lb 9.6 oz (66 kg)   LMP 09/24/2016 (Approximate)   SpO2 99%   BMI 28.44 kg/m   Physical Exam Vitals reviewed.   Constitutional:      Appearance: She is not ill-appearing.  HENT:     Head: Normocephalic.  Eyes:     Conjunctiva/sclera: Conjunctivae normal.  Cardiovascular:     Rate and Rhythm: Normal rate.  Pulmonary:     Effort: Pulmonary effort is normal. No respiratory distress.  Skin:    Comments: L arm with two prominent erythematous/papules vs vesicles, otherwise patches of dry erythematous skin b/l arms and above R eye  Neurological:     General: No focal deficit present.     Mental Status: She is alert and oriented to person, place, and time.  Psychiatric:        Mood and Affect: Mood normal.        Behavior: Behavior normal.      No results found for any visits on 04/04/23.  Assessment & Plan     1. Atopic dermatitis, unspecified type Advised triamcinolone ok on body-- use sparingly on face and do not get in eyes Zaditor or pataday okay otc for itchy eyes  Cont hydroxyzine for anxiety/itch  Rx prednisone taper  - triamcinolone cream (KENALOG) 0.1 %; Apply 1 Application topically 2 (two) times daily.  Dispense: 30 g; Refill: 0 - predniSONE (DELTASONE) 10 MG tablet; Take 6 tablets (60 mg total) by mouth daily with breakfast for 1 day, THEN 5 tablets (50 mg total) daily with breakfast for 1 day, THEN 4 tablets (40 mg total) daily with breakfast for 1 day, THEN 3 tablets (30 mg total) daily with breakfast for 1 day, THEN 2 tablets (20 mg total) daily with breakfast for 1 day, THEN 1 tablet (10 mg total) daily with breakfast for 1 day.  Dispense: 21 tablet; Refill: 0   Return if symptoms worsen or fail to improve.      I, Alfredia Ferguson, PA-C have reviewed all documentation for this visit. The documentation on  04/04/23   for the exam, diagnosis, procedures, and orders are all accurate and complete.  Alfredia Ferguson, PA-C [facility] [address] [place], [place], [address] Office: [phone] Fax: [phone]   [facility]

## 2023-04-08 ENCOUNTER — Encounter: Payer: Self-pay | Admitting: Physician Assistant

## 2023-04-09 ENCOUNTER — Telehealth: Payer: Self-pay

## 2023-04-09 NOTE — Telephone Encounter (Signed)
Tried reaching patient via number in chart-no answer-unable to leave message.    Call and notify pt. OK to Stop Amitiza BID if it is causing abdominal Cramping.  Rec. Start Rx Trulance 3mg  1 capsule once daily, #30, 1 RF for Constipation.  She can also re-start Lactulose 30ml once or twice daily for constipation.  Rec. Rx Dicyclomine 10mg  1 tablet TID prn abdominal cramping, #90, 1 RF.  --Celso Amy, PA-C

## 2023-04-11 ENCOUNTER — Encounter: Payer: Self-pay | Admitting: Physician Assistant

## 2023-04-11 ENCOUNTER — Other Ambulatory Visit: Payer: Self-pay | Admitting: Physician Assistant

## 2023-04-11 DIAGNOSIS — K589 Irritable bowel syndrome without diarrhea: Secondary | ICD-10-CM

## 2023-04-11 MED ORDER — DICYCLOMINE HCL 10 MG PO CAPS
10.0000 mg | ORAL_CAPSULE | Freq: Three times a day (TID) | ORAL | 2 refills | Status: DC
Start: 2023-04-11 — End: 2023-04-18

## 2023-04-17 NOTE — Progress Notes (Signed)
Celso Amy, PA-C [address]  [address]  [place], [place] [address]  Main: [phone]  Fax: [phone]   Primary Care Physician: Jacky Kindle, FNP  Primary Gastroenterologist:  Celso Amy, PA-C / Dr. Wyline Mood    HPI: Melinda Shaw is a 57 y.o. female returns for 1 month follow-up of constipation.  History of chronic abdominal pain, bloating, constipation, and IBS for many years.  1 month ago she was switched from Linzess 290 to Amitiza 24 mcg twice daily.  Also added lactulose 30 mill twice daily.  She saw hepatologist, Dr. Raford Pitcher at [facility], for NAFLD 03/28/2023.  It was recommended that she switch from lactulose to MiraLAX.  She does not have cirrhosis.  She experienced increased abdominal cramping and bloating.  Discontinued Amitiza and was switched to Trulance 3 mg 1 capsule daily.  Also started dicyclomine 10 mg 3 times daily as needed.  Last EGD 01/2022 by Dr. Tobi Bastos was normal.  No evidence of esophageal varices or stricture.  Repeat EGD recommended in 3 years to screen for varices.   Colonoscopy 03/2021 (done at [facility]) showed  prior end-to-side ileocolonic anastomosis in the ascending colon diverticulosis and 2 subcentimeter polyps were resected.  Normal biopsies of the colon and the polyp was a tubular adenoma. Repeat in 5 years.  MRI /MRCP abdomen with and without contrast 02/2022 showed no liver masses.  Previous cholecystectomy.  Normal pancreas.    Current Outpatient Medications  Medication Sig Dispense Refill   dicyclomine (BENTYL) 10 MG capsule Take 1 capsule (10 mg total) by mouth 3 (three) times daily before meals. 90 capsule 2   albuterol (VENTOLIN HFA) 108 (90 Base) MCG/ACT inhaler Inhale 1-2 puffs into the lungs every 6 (six) hours as needed for wheezing or shortness of breath. 18 g 11   aspirin EC 81 MG tablet Take 81 mg by mouth daily.     clonazePAM (KLONOPIN) 0.5 MG tablet Take 1 tablet (0.5 mg total) by mouth as directed. Take 1 tablet three times a  week only for severe anxiety attacks 21 tablet 1   diltiazem (CARDIZEM CD) 240 MG 24 hr capsule TAKE ONE CAPSULE BY MOUTH DAILY 90 capsule 0   diltiazem (CARDIZEM) 30 MG tablet Take 1 tablet (30 mg total) by mouth daily as needed (palpitations). 90 tablet 0   docusate sodium (COLACE) 100 MG capsule Take 100 mg by mouth 2 (two) times daily.     EPINEPHrine 0.3 mg/0.3 mL IJ SOAJ injection Inject 0.3 mg into the muscle as needed for anaphylaxis.     eszopiclone (LUNESTA) 1 MG TABS tablet Take 1 tablet (1 mg total) by mouth at bedtime. Take immediately before bedtime 90 tablet 0   gabapentin (NEURONTIN) 400 MG capsule Take 400 mg by mouth 3 (three) times daily.     hydrOXYzine (VISTARIL) 25 MG capsule Take 1 capsule (25 mg total) by mouth 3 (three) times daily as needed for anxiety. And sleep 270 capsule 1   Lactulose 20 GM/30ML SOLN Take 30 mLs (20 g total) by mouth in the morning and at bedtime. 1800 mL 2   lithium carbonate 300 MG capsule Take 600 mg by mouth at bedtime.     lubiprostone (AMITIZA) 24 MCG capsule Take 1 capsule (24 mcg total) by mouth 2 (two) times daily with a meal. 60 capsule 3   mometasone-formoterol (DULERA) 200-5 MCG/ACT AERO Inhale into the lungs.     nitroGLYCERIN (NITROSTAT) 0.4 MG SL tablet PLACE ONE TABLET  UNDER THE TONGUE EVERY 5 MINUTES AS NEEDED FOR CHEST PAIN. MAXIMUM OF 3 DOSES. 25 tablet 0   omeprazole (PRILOSEC) 40 MG capsule Take 1 capsule (40 mg total) by mouth daily. 90 capsule 1   ondansetron (ZOFRAN ODT) 4 MG disintegrating tablet Take 1 tablet (4 mg total) by mouth every 8 (eight) hours as needed for nausea or vomiting. 12 tablet 0   rosuvastatin (CRESTOR) 20 MG tablet Take 1 tablet (20 mg total) by mouth at bedtime. 90 tablet 0   sertraline (ZOLOFT) 50 MG tablet Take 1 tablet (50 mg total) by mouth daily. 90 tablet 0   tiotropium (SPIRIVA) 18 MCG inhalation capsule Place into inhaler and inhale.     tiZANidine (ZANAFLEX) 4 MG tablet Take 1 tablet (4 mg total)  by mouth at bedtime. 90 tablet 1   triamcinolone cream (KENALOG) 0.1 % Apply 1 Application topically 2 (two) times daily. 30 g 0   No current facility-administered medications for this visit.    Allergies as of 04/18/2023 - Review Complete 04/04/2023  Allergen Reaction Noted   Cherry Swelling 02/22/2015   Other Shortness Of Breath 05/12/2014   Shellfish allergy Shortness Of Breath 05/25/2015   Sulfa antibiotics Shortness Of Breath 02/22/2015   Bee venom  02/22/2015   Imdur [isosorbide nitrate]  12/31/2019    Past Medical History:  Diagnosis Date   Anemia    H/O   Anxiety    Bronchitis    Chest pain    a. 09/2018 Cath: Nl cors. EF 65%->Med rx (long-acting nitrate added).   Depression    Diastolic dysfunction    a. 10/2018 Echo: EF 55-60%, no rwma, Gr1DD. Mild MR. Nl RV fxn.   DOE (dyspnea on exertion)    Fibromyalgia    GERD (gastroesophageal reflux disease)    Headache    Hyperlipidemia    Hypertension    no meds   Palpitations    a. 01/2019 [facility]): NSR w/ rare PACs & PVCs; b. 04/2020 [facility]: Predominantly sinus rhythm @ 75 (48-141). Rare PACs/PVCs. No significant arrhythmias or prolonged pauses.  No triggered events.   PONV (postoperative nausea and vomiting)     Past Surgical History:  Procedure Laterality Date   ANTERIOR CRUCIATE LIGAMENT REPAIR     CARDIAC CATHETERIZATION     CHOLECYSTECTOMY     CORONARY ANGIOPLASTY     ESOPHAGOGASTRODUODENOSCOPY (EGD) WITH PROPOFOL N/A 08/28/2015   Procedure: ESOPHAGOGASTRODUODENOSCOPY (EGD) WITH PROPOFOL;  Surgeon: Elnita Maxwell, MD;  Location: [facility] ENDOSCOPY;  Service: Endoscopy;  Laterality: N/A;   ESOPHAGOGASTRODUODENOSCOPY (EGD) WITH PROPOFOL N/A 01/31/2022   Procedure: ESOPHAGOGASTRODUODENOSCOPY (EGD) WITH PROPOFOL;  Surgeon: Wyline Mood, MD;  Location: [facility] ENDOSCOPY;  Service: Gastroenterology;  Laterality: N/A;   KNEE SURGERY     left knee    LEFT HEART CATH AND CORONARY ANGIOGRAPHY Left 09/22/2018   Procedure:  LEFT HEART CATH AND CORONARY ANGIOGRAPHY;  Surgeon: [name], MD;  Location: [facility] INVASIVE CV LAB;  Service: Cardiovascular;  Laterality: Left;   MASS EXCISION Left 01/26/2016   Procedure: EXCISION OF LEFT WRIST VOLAR MASS;  Surgeon: Dairl Ponder, MD;  Location: [facility];  Service: Orthopedics;  Laterality: Left;   NOSE SURGERY     POLYPECTOMY     TUBAL LIGATION     VIDEO BRONCHOSCOPY N/A 05/29/2015   Procedure: VIDEO BRONCHOSCOPY WITHOUT FLUORO;  Surgeon: Stephanie Acre, MD;  Location: [facility] ORS;  Service: Cardiopulmonary;  Laterality: N/A;    Review of Systems:  All systems reviewed and negative except where noted in HPI.   Physical Examination:   LMP 09/24/2016 (Approximate)   General: Well-nourished, well-developed in no acute distress.  Eyes: No icterus. Conjunctivae pink. Mouth: Oropharyngeal mucosa moist and pink , no lesions erythema or exudate. Lungs: Clear to auscultation bilaterally. Non-labored. Heart: Regular rate and rhythm, no murmurs rubs or gallops.  Abdomen: Bowel sounds are normal; Abdomen is Soft; No hepatosplenomegaly, masses or hernias;  No Abdominal Tenderness; No guarding or rebound tenderness. Extremities: No lower extremity edema. No clubbing or deformities. Neuro: Alert and oriented x 3.  Grossly intact. Skin: Warm and dry, no jaundice.   Psych: Alert and cooperative, normal mood and affect.   Imaging Studies: MM 3D SCREENING MAMMOGRAM BILATERAL BREAST  Result Date: 03/21/2023 CLINICAL DATA:  Screening. EXAM: DIGITAL SCREENING BILATERAL MAMMOGRAM WITH TOMOSYNTHESIS AND CAD TECHNIQUE: Bilateral screening digital craniocaudal and mediolateral oblique mammograms were obtained. Bilateral screening digital breast tomosynthesis was performed. The images were evaluated with computer-aided detection. COMPARISON:  Previous exam(s). ACR Breast Density Category b: There are scattered areas of fibroglandular density. FINDINGS: There are no  findings suspicious for malignancy. IMPRESSION: No mammographic evidence of malignancy. A result letter of this screening mammogram will be mailed directly to the patient. RECOMMENDATION: Screening mammogram in one year. (Code:SM-B-01Y) BI-RADS CATEGORY  1: Negative. Electronically Signed   By: Ted Mcalpine M.D.   On: 03/21/2023 17:06    Assessment and Plan:   Melinda Shaw is a 57 y.o. y/o female returns for follow-up of irritable bowel syndrome with chronic constipation.  She is currently doing well.  Greatly improved on Linzess and dicyclomine.  Irritable bowel syndrome with chronic constipation  Continue Linzess daily.  Continue Dicyclomine 10 mg 3 times daily as needed.  Low FODMAP diet.  Patient education handout given and discussed.  NAFLD without cirrhosis  Stable.  She is followed by Dr. Raford Pitcher at [facility] every 6 months.  Continue low-fat diet, exercise, and weight loss.   Celso Amy, PA-C  Follow up As Needed.

## 2023-04-18 ENCOUNTER — Other Ambulatory Visit: Payer: Self-pay

## 2023-04-18 ENCOUNTER — Ambulatory Visit (INDEPENDENT_AMBULATORY_CARE_PROVIDER_SITE_OTHER): Payer: PPO | Admitting: Physician Assistant

## 2023-04-18 ENCOUNTER — Ambulatory Visit: Payer: PPO | Attending: Internal Medicine | Admitting: Internal Medicine

## 2023-04-18 ENCOUNTER — Encounter: Payer: Self-pay | Admitting: Internal Medicine

## 2023-04-18 ENCOUNTER — Encounter: Payer: Self-pay | Admitting: Physician Assistant

## 2023-04-18 ENCOUNTER — Other Ambulatory Visit
Admission: RE | Admit: 2023-04-18 | Discharge: 2023-04-18 | Disposition: A | Discharge status: Home / Self Care | Payer: PPO | Source: Ambulatory Visit | Attending: Internal Medicine | Admitting: Internal Medicine

## 2023-04-18 ENCOUNTER — Ambulatory Visit: Payer: PPO | Admitting: Physician Assistant

## 2023-04-18 VITALS — BP 132/80 | HR 52 | Ht 60.0 in | Wt 145.8 lb

## 2023-04-18 VITALS — BP 123/67 | HR 58 | Temp 98.3°F | Ht 60.0 in | Wt 146.0 lb

## 2023-04-18 DIAGNOSIS — K589 Irritable bowel syndrome without diarrhea: Secondary | ICD-10-CM | POA: Diagnosis not present

## 2023-04-18 DIAGNOSIS — R002 Palpitations: Secondary | ICD-10-CM

## 2023-04-18 DIAGNOSIS — Z79899 Other long term (current) drug therapy: Secondary | ICD-10-CM | POA: Diagnosis present

## 2023-04-18 DIAGNOSIS — K581 Irritable bowel syndrome with constipation: Secondary | ICD-10-CM | POA: Diagnosis not present

## 2023-04-18 DIAGNOSIS — R0789 Other chest pain: Secondary | ICD-10-CM | POA: Diagnosis not present

## 2023-04-18 DIAGNOSIS — I10 Essential (primary) hypertension: Secondary | ICD-10-CM

## 2023-04-18 DIAGNOSIS — E782 Mixed hyperlipidemia: Secondary | ICD-10-CM

## 2023-04-18 LAB — MAGNESIUM: Magnesium: 2.4 mg/dL (ref 1.7–2.4)

## 2023-04-18 LAB — TSH: TSH: 2.212 u[IU]/mL (ref 0.350–4.500)

## 2023-04-18 LAB — LIPID PANEL
Cholesterol: 121 mg/dL (ref 0–200)
HDL: 52 mg/dL (ref >40)
LDL Cholesterol: 53 mg/dL (ref 0–99)
Total CHOL/HDL Ratio: 2.3 RATIO
Triglycerides: 81 mg/dL (ref <150)
VLDL: 16 mg/dL (ref 0–40)

## 2023-04-18 LAB — BASIC METABOLIC PANEL
Anion gap: 6 (ref 5–15)
BUN: 6 mg/dL (ref 6–20)
CO2: 25 mmol/L (ref 22–32)
Calcium: 9.2 mg/dL (ref 8.9–10.3)
Chloride: 109 mmol/L (ref 98–111)
Creatinine, Ser: 0.72 mg/dL (ref 0.44–1.00)
GFR, Estimated: >60 mL/min (ref >60)
Glucose, Bld: 125 mg/dL — ABNORMAL HIGH (ref 70–99)
Potassium: 3 mmol/L — ABNORMAL LOW (ref 3.5–5.1)
Sodium: 140 mmol/L (ref 135–145)

## 2023-04-18 MED ORDER — LINACLOTIDE 290 MCG PO CAPS
290.0000 ug | ORAL_CAPSULE | Freq: Every day | ORAL | 3 refills | Status: DC
Start: 2023-04-18 — End: 2023-06-13

## 2023-04-18 MED ORDER — ROSUVASTATIN CALCIUM 20 MG PO TABS: 20.0000 mg | ORAL_TABLET | Freq: Every day | ORAL | 3 refills | Status: DC

## 2023-04-18 MED ORDER — DILTIAZEM HCL ER COATED BEADS 240 MG PO CP24: 240.0000 mg | ORAL_CAPSULE | Freq: Every day | ORAL | 3 refills | Status: DC

## 2023-04-18 MED ORDER — NITROGLYCERIN 0.4 MG SL SUBL: SUBLINGUAL_TABLET | SUBLINGUAL | 3 refills | Status: DC

## 2023-04-18 MED ORDER — POTASSIUM CHLORIDE CRYS ER 20 MEQ PO TBCR: 20.0000 meq | EXTENDED_RELEASE_TABLET | Freq: Every day | ORAL | 3 refills | Status: DC

## 2023-04-18 MED ORDER — DICYCLOMINE HCL 10 MG PO CAPS
10.0000 mg | ORAL_CAPSULE | Freq: Three times a day (TID) | ORAL | 2 refills | Status: DC
Start: 2023-04-18 — End: 2024-01-21

## 2023-04-18 NOTE — Progress Notes (Signed)
Follow-up Outpatient Visit Date: 04/18/2023  Primary Care Provider: Jacky Kindle, FNP [address] [place] [address]  Chief Complaint: Palpitations  HPI:  Melinda Shaw is a 57 y.o. female with history of chest pain with normal coronary arteries by coronary CTA, dyspnea on exertion, palpitations, hypertension, hyperlipidemia, fibromyalgia, bronchitis, and GERD, who presents for follow-up of palpitations.  I last saw her in November, at which time she complained of sporadic "speeding up and slowing down" of her heart.  This prompted her to trip and land on her chest, leading to severe chest and upper abdominal pain.  She was diagnosed with a fracture of the eighth rib on the right at urgent care.  She otherwise has been feeling well but was undergoing evaluation for "autoimmune disease" involving her liver at [facility].  Most recent hepatology note from last month supports diagnosis of NAFLD.  At our last visit, we did not make any medication changes or pursue additional testing.  Today, [name] reports that she has been more aware of palpitations over the last few weeks.  The symptoms typically only last a second or two and can occur at rest or with activity.  She sometimes feels a little dizzy when they occur but otherwise has not had any associated symptoms.  She has not fallen or passed out.  Her chest wall pain after fall discussed at our last visit has resolved.  She denies dyspnea and edema.  She continues to take her daily diltiazem and has also needed her as needed short acting diltiazem 5-6 times since our last visit.  --------------------------------------------------------------------------------------------------  Past Medical History:  Diagnosis Date   Anemia    H/O   Anxiety    Bronchitis    Chest pain    a. 09/2018 Cath: Nl cors. EF 65%->Med rx (long-acting nitrate added).   Depression    Diastolic dysfunction    a. 10/2018 Echo: EF 55-60%, no rwma, Gr1DD. Mild MR. Nl  RV fxn.   DOE (dyspnea on exertion)    Fibromyalgia    GERD (gastroesophageal reflux disease)    Headache    Hyperlipidemia    Hypertension    no meds   Palpitations    a. 01/2019 [facility]): NSR w/ rare PACs & PVCs; b. 04/2020 [facility]: Predominantly sinus rhythm @ 75 (48-141). Rare PACs/PVCs. No significant arrhythmias or prolonged pauses.  No triggered events.   PONV (postoperative nausea and vomiting)    Past Surgical History:  Procedure Laterality Date   ANTERIOR CRUCIATE LIGAMENT REPAIR     CARDIAC CATHETERIZATION     CHOLECYSTECTOMY     CORONARY ANGIOPLASTY     ESOPHAGOGASTRODUODENOSCOPY (EGD) WITH PROPOFOL N/A 08/28/2015   Procedure: ESOPHAGOGASTRODUODENOSCOPY (EGD) WITH PROPOFOL;  Surgeon: Elnita Maxwell, MD;  Location: [facility] ENDOSCOPY;  Service: Endoscopy;  Laterality: N/A;   ESOPHAGOGASTRODUODENOSCOPY (EGD) WITH PROPOFOL N/A 01/31/2022   Procedure: ESOPHAGOGASTRODUODENOSCOPY (EGD) WITH PROPOFOL;  Surgeon: Wyline Mood, MD;  Location: [facility] ENDOSCOPY;  Service: Gastroenterology;  Laterality: N/A;   KNEE SURGERY     left knee    LEFT HEART CATH AND CORONARY ANGIOGRAPHY Left 09/22/2018   Procedure: LEFT HEART CATH AND CORONARY ANGIOGRAPHY;  Surgeon: [name], MD;  Location: [facility] INVASIVE CV LAB;  Service: Cardiovascular;  Laterality: Left;   MASS EXCISION Left 01/26/2016   Procedure: EXCISION OF LEFT WRIST VOLAR MASS;  Surgeon: Dairl Ponder, MD;  Location: [facility];  Service: Orthopedics;  Laterality: Left;   NOSE SURGERY  POLYPECTOMY     TUBAL LIGATION     VIDEO BRONCHOSCOPY N/A 05/29/2015   Procedure: VIDEO BRONCHOSCOPY WITHOUT FLUORO;  Surgeon: Stephanie Acre, MD;  Location: [facility] ORS;  Service: Cardiopulmonary;  Laterality: N/A;    Current Meds  Medication Sig   albuterol (VENTOLIN HFA) 108 (90 Base) MCG/ACT inhaler Inhale 1-2 puffs into the lungs every 6 (six) hours as needed for wheezing or shortness of breath.   aspirin EC 81 MG tablet Take 81  mg by mouth daily.   clonazePAM (KLONOPIN) 0.5 MG tablet Take 1 tablet (0.5 mg total) by mouth as directed. Take 1 tablet three times a week only for severe anxiety attacks   dicyclomine (BENTYL) 10 MG capsule Take 1 capsule (10 mg total) by mouth 3 (three) times daily before meals.   diltiazem (CARDIZEM) 30 MG tablet Take 1 tablet (30 mg total) by mouth daily as needed (palpitations).   docusate sodium (COLACE) 100 MG capsule Take 100 mg by mouth 2 (two) times daily.   EPINEPHrine 0.3 mg/0.3 mL IJ SOAJ injection Inject 0.3 mg into the muscle as needed for anaphylaxis.   eszopiclone (LUNESTA) 1 MG TABS tablet Take 1 tablet (1 mg total) by mouth at bedtime. Take immediately before bedtime   gabapentin (NEURONTIN) 400 MG capsule Take 400 mg by mouth 3 (three) times daily.   hydrOXYzine (VISTARIL) 25 MG capsule Take 1 capsule (25 mg total) by mouth 3 (three) times daily as needed for anxiety. And sleep   linaclotide (LINZESS) 290 MCG CAPS capsule Take 1 capsule (290 mcg total) by mouth daily before breakfast.   lithium carbonate 300 MG capsule Take 600 mg by mouth at bedtime.   mometasone-formoterol (DULERA) 200-5 MCG/ACT AERO Inhale into the lungs.   nortriptyline (PAMELOR) 10 MG capsule Take 10 mg by mouth at bedtime.   omeprazole (PRILOSEC) 40 MG capsule Take 1 capsule (40 mg total) by mouth daily.   ondansetron (ZOFRAN ODT) 4 MG disintegrating tablet Take 1 tablet (4 mg total) by mouth every 8 (eight) hours as needed for nausea or vomiting.   sertraline (ZOLOFT) 50 MG tablet Take 1 tablet (50 mg total) by mouth daily.   tiotropium (SPIRIVA) 18 MCG inhalation capsule Place into inhaler and inhale.   tiZANidine (ZANAFLEX) 4 MG tablet Take 1 tablet (4 mg total) by mouth at bedtime.   triamcinolone cream (KENALOG) 0.1 % Apply 1 Application topically 2 (two) times daily.   [DISCONTINUED] diltiazem (CARDIZEM CD) 240 MG 24 hr capsule TAKE ONE CAPSULE BY MOUTH DAILY   [DISCONTINUED] nitroGLYCERIN  (NITROSTAT) 0.4 MG SL tablet PLACE ONE TABLET UNDER THE TONGUE EVERY 5 MINUTES AS NEEDED FOR CHEST PAIN. MAXIMUM OF 3 DOSES.   [DISCONTINUED] rosuvastatin (CRESTOR) 20 MG tablet Take 1 tablet (20 mg total) by mouth at bedtime.    Allergies: Cherry, Other, Shellfish allergy, Sulfa antibiotics, Bee venom, and Imdur [isosorbide nitrate]  Social History   Tobacco Use   Smoking status: Never   Smokeless tobacco: Never  Vaping Use   Vaping Use: Never used  Substance Use Topics   Alcohol use: No    Alcohol/week: 0.0 standard drinks of alcohol   Drug use: No    Family History  Problem Relation Age of Onset   Dementia Mother    Alcohol abuse Mother    COPD Mother    High Cholesterol Mother    Heart failure Mother    Hypertension Mother    Dementia Father    Alcohol abuse Father  Hypertension Father    AAA (abdominal aortic aneurysm) Sister    COPD Sister    High Cholesterol Sister     Review of Systems: A 12-system review of systems was performed and was negative except as noted in the HPI.  --------------------------------------------------------------------------------------------------  Physical Exam: BP 132/80 (BP Location: Left Arm, Patient Position: Sitting, Cuff Size: Normal)   Pulse (!) 52   Ht 5' (1.524 m)   Wt 145 lb 12.8 oz (66.1 kg)   LMP 09/24/2016 (Approximate)   SpO2 99%   BMI 28.47 kg/m   General:  NAD. Neck: No JVD or HJR. Lungs: Clear to auscultation bilaterally without wheezes or crackles. Heart: Bradycardic but regular without murmurs. Abdomen: Soft, nontender, nondistended. Extremities: No lower extremity edema.  EKG: Sinus bradycardia with possible left atrial enlargement.  No significant abnormality or change from prior tracing on 10/10/2022.  Lab Results  Component Value Date   WBC 6.6 09/05/2022   HGB 13.4 09/05/2022   HCT 40.6 09/05/2022   MCV 92 09/05/2022   PLT 240 09/05/2022    Lab Results  Component Value Date   NA 140  04/18/2023   K 3.0 (L) 04/18/2023   CL 109 04/18/2023   CO2 25 04/18/2023   BUN 6 04/18/2023   CREATININE 0.72 04/18/2023   GLUCOSE 125 (H) 04/18/2023   ALT 26 09/05/2022   --------------------------------------------------------------------------------------------------  ASSESSMENT AND PLAN: Palpitations: These have been longstanding but seem to be more frequent over the last few weeks despite continued diltiazem use.  We will check a BMP, TSH, and magnesium level today.  Though some baseline bradycardia is noted, Ms. Poteet does not have any symptoms from this; we will therefore continue current doses of long-acting and as needed diltiazem.  If symptoms progress, repeat ambulatory cardiac monitoring will need to be considered.  Chest pain: This has resolved after healing of right eighth rib fracture.  No further workup recommended.  Hypertension: Blood pressure upper normal today.  No medication changes at this time.  Hyperlipidemia: No recent lipid panel noted in our system.  Continue rosuvastatin for now with lipid panel today to assess response.  Given lack of established ASCVD, de-escalation or discontinuation could be considered based on results.  Follow-up: Return to clinic in 6 months.  [name], MD 04/18/2023 2:53 PM

## 2023-04-18 NOTE — Patient Instructions (Signed)
Medication Instructions:  Your Physician recommend you continue on your current medication as directed.     *If you need a refill on your cardiac medications before your next appointment, please call your pharmacy*   Lab Work: Your provider would like for you to have following labs drawn: (BMP, Mg, TSH).   Please go to the [facility] entrance and check in at the front desk.  You do not need an appointment.  They are open from 7am-6 pm.    If you have labs (blood work) drawn today and your tests are completely normal, you will receive your results only by: MyChart Message (if you have MyChart) OR A paper copy in the mail If you have any lab test that is abnormal or we need to change your treatment, we will call you to review the results.   Testing/Procedures: None ordered today   Follow-Up: At [facility], you and your health needs are our priority.  As part of our continuing mission to provide you with exceptional heart care, we have created designated Provider Care Teams.  These Care Teams include your primary Cardiologist (physician) and Advanced Practice Providers (APPs -  Physician Assistants and Nurse Practitioners) who all work together to provide you with the care you need, when you need it.  We recommend signing up for the patient portal called "MyChart".  Sign up information is provided on this After Visit Summary.  MyChart is used to connect with patients for Virtual Visits (Telemedicine).  Patients are able to view lab/test results, encounter notes, upcoming appointments, etc.  Non-urgent messages can be sent to your provider as well.   To learn more about what you can do with MyChart, go to [email or web address].    Your next appointment:   6 month(s)  Provider:   You may see [name], MD or one of the following Advanced Practice Providers on your designated Care Team:   Nicolasa Ducking, NP Eula Listen, PA-C Cadence Fransico Michael, PA-C Charlsie Quest,  NP

## 2023-04-21 ENCOUNTER — Encounter: Payer: Self-pay | Admitting: Family Medicine

## 2023-04-21 ENCOUNTER — Ambulatory Visit (INDEPENDENT_AMBULATORY_CARE_PROVIDER_SITE_OTHER): Payer: PPO | Admitting: Family Medicine

## 2023-04-21 ENCOUNTER — Telehealth (INDEPENDENT_AMBULATORY_CARE_PROVIDER_SITE_OTHER): Payer: PPO | Admitting: Psychiatry

## 2023-04-21 ENCOUNTER — Encounter: Payer: Self-pay | Admitting: Psychiatry

## 2023-04-21 VITALS — BP 132/74 | HR 66 | Ht 60.0 in | Wt 151.0 lb

## 2023-04-21 DIAGNOSIS — Z79899 Other long term (current) drug therapy: Secondary | ICD-10-CM

## 2023-04-21 DIAGNOSIS — F429 Obsessive-compulsive disorder, unspecified: Secondary | ICD-10-CM

## 2023-04-21 DIAGNOSIS — F4310 Post-traumatic stress disorder, unspecified: Secondary | ICD-10-CM

## 2023-04-21 DIAGNOSIS — J339 Nasal polyp, unspecified: Secondary | ICD-10-CM

## 2023-04-21 DIAGNOSIS — L509 Urticaria, unspecified: Secondary | ICD-10-CM | POA: Diagnosis not present

## 2023-04-21 DIAGNOSIS — F3342 Major depressive disorder, recurrent, in full remission: Secondary | ICD-10-CM | POA: Diagnosis not present

## 2023-04-21 DIAGNOSIS — L237 Allergic contact dermatitis due to plants, except food: Secondary | ICD-10-CM

## 2023-04-21 MED ORDER — CETIRIZINE HCL 10 MG PO TABS: 10.0000 mg | ORAL_TABLET | Freq: Every day | ORAL | 11 refills | Status: DC

## 2023-04-21 MED ORDER — PREDNISONE 10 MG PO TABS: ORAL_TABLET | ORAL | 0 refills | Status: DC

## 2023-04-21 MED ORDER — CLOBETASOL PROPIONATE 0.05 % EX OINT: 1.0000 | TOPICAL_OINTMENT | Freq: Two times a day (BID) | CUTANEOUS | 0 refills | Status: DC

## 2023-04-21 MED ORDER — ROSUVASTATIN CALCIUM 10 MG PO TABS: 10.0000 mg | ORAL_TABLET | Freq: Every day | ORAL | 3 refills | Status: DC

## 2023-04-21 MED ORDER — TRETINOIN 0.05 % EX CREA: TOPICAL_CREAM | Freq: Every day | CUTANEOUS | 0 refills | Status: DC

## 2023-04-21 MED ORDER — FAMOTIDINE 20 MG PO TABS
20.00 mg | ORAL_TABLET | Freq: Every day | ORAL | 0 refills | Status: DC
Start: 2023-04-21 — End: 2023-05-19

## 2023-04-21 NOTE — Addendum Note (Signed)
Addended by: Parke Poisson on: 04/21/2023 09:07 AM   Modules accepted: Orders

## 2023-04-21 NOTE — Progress Notes (Signed)
Established patient visit   Patient: Melinda Shaw   DOB: 1965/11/30   57 y.o. Female  MRN: [id number] Visit Date: 04/21/2023  Today's healthcare provider: Jacky Kindle, FNP  Re Introduced to nurse practitioner role and practice setting.  All questions answered.  Discussed provider/patient relationship and expectations.  Chief Complaint  Patient presents with   Follow-up    Pt stated--last OV--still having itching and finish the medication. Nostril--painful w/ pressure--3 days   Subjective    HPI HPI     Follow-up    Additional comments: Pt stated--last OV--still having itching and finish the medication. Nostril--painful w/ pressure--3 days      Last edited by Shelly Bombard, CMA on 04/21/2023  4:03 PM.      Medications: Outpatient Medications Prior to Visit  Medication Sig   potassium chloride SA (KLOR-CON M20) 20 MEQ tablet Take 1 tablet (20 mEq total) by mouth daily.   albuterol (VENTOLIN HFA) 108 (90 Base) MCG/ACT inhaler Inhale 1-2 puffs into the lungs every 6 (six) hours as needed for wheezing or shortness of breath.   aspirin EC 81 MG tablet Take 81 mg by mouth daily.   clonazePAM (KLONOPIN) 0.5 MG tablet Take 1 tablet (0.5 mg total) by mouth as directed. Take 1 tablet three times a week only for severe anxiety attacks   dicyclomine (BENTYL) 10 MG capsule Take 1 capsule (10 mg total) by mouth 3 (three) times daily before meals.   diltiazem (CARDIZEM CD) 240 MG 24 hr capsule Take 1 capsule (240 mg total) by mouth daily.   diltiazem (CARDIZEM) 30 MG tablet Take 1 tablet (30 mg total) by mouth daily as needed (palpitations).   docusate sodium (COLACE) 100 MG capsule Take 100 mg by mouth 2 (two) times daily.   EPINEPHrine 0.3 mg/0.3 mL IJ SOAJ injection Inject 0.3 mg into the muscle as needed for anaphylaxis.   eszopiclone (LUNESTA) 1 MG TABS tablet Take 1 tablet (1 mg total) by mouth at bedtime. Take immediately before bedtime   gabapentin (NEURONTIN) 400 MG  capsule Take 400 mg by mouth 3 (three) times daily.   hydrOXYzine (VISTARIL) 25 MG capsule Take 1 capsule (25 mg total) by mouth 3 (three) times daily as needed for anxiety. And sleep   linaclotide (LINZESS) 290 MCG CAPS capsule Take 1 capsule (290 mcg total) by mouth daily before breakfast.   lithium carbonate 300 MG capsule Take 600 mg by mouth at bedtime.   mometasone-formoterol (DULERA) 200-5 MCG/ACT AERO Inhale into the lungs.   nitroGLYCERIN (NITROSTAT) 0.4 MG SL tablet PLACE ONE TABLET UNDER THE TONGUE EVERY 5 MINUTES AS NEEDED FOR CHEST PAIN. MAXIMUM OF 3 DOSES.   nortriptyline (PAMELOR) 10 MG capsule Take 10 mg by mouth at bedtime.   omeprazole (PRILOSEC) 40 MG capsule Take 1 capsule (40 mg total) by mouth daily.   ondansetron (ZOFRAN ODT) 4 MG disintegrating tablet Take 1 tablet (4 mg total) by mouth every 8 (eight) hours as needed for nausea or vomiting.   rosuvastatin (CRESTOR) 10 MG tablet Take 1 tablet (10 mg total) by mouth at bedtime.   sertraline (ZOLOFT) 50 MG tablet Take 1 tablet (50 mg total) by mouth daily.   tiotropium (SPIRIVA) 18 MCG inhalation capsule Place into inhaler and inhale.   tiZANidine (ZANAFLEX) 4 MG tablet Take 1 tablet (4 mg total) by mouth at bedtime.   triamcinolone cream (KENALOG) 0.1 % Apply 1 Application topically 2 (two) times daily.   No  facility-administered medications prior to visit.    Review of Systems    Objective    BP 132/74 (BP Location: Left Arm, Patient Position: Sitting)   Pulse 66   Ht 5' (1.524 m)   Wt 151 lb (68.5 kg)   LMP 09/24/2016 (Approximate)   SpO2 98%   BMI 29.49 kg/m   Physical Exam Vitals and nursing note reviewed.  Constitutional:      General: She is not in acute distress.    Appearance: Normal appearance. She is overweight. She is not ill-appearing, toxic-appearing or diaphoretic.  HENT:     Head: Normocephalic and atraumatic.  Cardiovascular:     Rate and Rhythm: Normal rate and regular rhythm.      Pulses: Normal pulses.     Heart sounds: Normal heart sounds. No murmur heard.    No friction rub. No gallop.  Pulmonary:     Effort: Pulmonary effort is normal. No respiratory distress.     Breath sounds: Normal breath sounds. No stridor. No wheezing, rhonchi or rales.  Chest:     Chest wall: No tenderness.  Musculoskeletal:        General: No swelling, tenderness, deformity or signs of injury. Normal range of motion.     Right lower leg: No edema.     Left lower leg: No edema.  Skin:    General: Skin is warm and dry.     Capillary Refill: Capillary refill takes less than 2 seconds.     Coloration: Skin is not jaundiced or pale.     Findings: Erythema, lesion and rash present. No bruising.  Neurological:     General: No focal deficit present.     Mental Status: She is alert and oriented to person, place, and time. Mental status is at baseline.     Cranial Nerves: No cranial nerve deficit.     Sensory: No sensory deficit.     Motor: No weakness.     Coordination: Coordination normal.  Psychiatric:        Mood and Affect: Mood normal.        Behavior: Behavior normal.        Thought Content: Thought content normal.        Judgment: Judgment normal.     No results found for any visits on 04/21/23.  Assessment & Plan     Problem List Items Addressed This Visit       Respiratory   Nasal polyp    Acute, self limiting Encourage topical retin to assist as well as warm compresses Continue to monitor        Musculoskeletal and Integument   Poison ivy dermatitis - Primary    Ongoing concern since 5/24; was treated with steroids and topical low dose steroid cream Continues to use home atarax to assist with itching Repeat steroid course at this time Continue to monitor healing Refer to derm if necessary Start on adjunct measures to assist with symptom mgmt       Urticaria    Ongoing concern since 5/24; was treated with steroids and topical low dose steroid  cream Continues to use home atarax to assist with itching Repeat steroid course at this time Continue to monitor healing Refer to derm if necessary Start on adjunct measures to assist with symptom mgmt       Return if symptoms worsen or fail to improve.     Leilani Merl, FNP, have reviewed all documentation for this visit. The documentation on  04/22/23 for the exam, diagnosis, procedures, and orders are all accurate and complete.  Jacky Kindle, FNP  [facility] [phone] (phone) [phone] (fax)  [facility]

## 2023-04-21 NOTE — Progress Notes (Unsigned)
Virtual Visit via Video Note  I connected with Melinda Shaw on 04/21/23 at  3:00 PM EDT by a video enabled telemedicine application and verified that I am speaking with the correct person using two identifiers.  Location Provider Location : [facility] Patient Location : Home  Participants: Patient , Provider    I discussed the limitations of evaluation and management by telemedicine and the availability of in person appointments. The patient expressed understanding and agreed to proceed.   I discussed the assessment and treatment plan with the patient. The patient was provided an opportunity to ask questions and all were answered. The patient agreed with the plan and demonstrated an understanding of the instructions.   The patient was advised to call back or seek an in-person evaluation if the symptoms worsen or if the condition fails to improve as anticipated.   BH MD OP Progress Note  04/22/2023 12:37 PM Melinda Shaw  MRN:  [id number]  Chief Complaint:  Chief Complaint  Patient presents with   Follow-up   Anxiety   Depression   HPI: Melinda Shaw is a 57 year old female who lives in [place], married, on disability, has a history of PTSD, MDD, OCD, hypertension, COPD, history of CVA on ASA, fibromyalgia, history of appendiceal neoplasm, history of prior ileocecectomy, recent diagnosis of hepatitis was evaluated by telemedicine today.  Patient today reports she is currently doing well.  Denies any significant mood swings.  Denies any intrusive memories, nightmares.  Reports sleep is overall good.  Continues to take the Lunesta.  Tolerating it well.  Has not had a full-blown panic attack in a long time.  Does have anxiety attacks although she is managing okay.  Still has clonazepam available which she uses as needed.  Trying to limit use.  Patient denies any suicidality, homicidality or perceptual disturbances.  Continues to follow-up with therapist, has upcoming  appointment tomorrow.  Motivated to stay in therapy.  Patient denies any other concerns today.  Visit Diagnosis:    ICD-10-CM   1. PTSD (post-traumatic stress disorder)  F43.10 Lithium level    2. Recurrent major depressive disorder, in full remission (HCC)  F33.42 Lithium level    3. Obsessive-compulsive disorder with good or fair insight  F42.9 Lithium level    4. High risk medication use  Z79.899 Lithium level      Past Psychiatric History: I have reviewed past psychiatric history from progress note on 12/06/2022.  Past trials of medications-Seroquel-multiple others.  Past Medical History:  Past Medical History:  Diagnosis Date   Anemia    H/O   Anxiety    Bronchitis    Chest pain    a. 09/2018 Cath: Nl cors. EF 65%->Med rx (long-acting nitrate added).   Depression    Diastolic dysfunction    a. 10/2018 Echo: EF 55-60%, no rwma, Gr1DD. Mild MR. Nl RV fxn.   DOE (dyspnea on exertion)    Fibromyalgia    GERD (gastroesophageal reflux disease)    Headache    Hyperlipidemia    Hypertension    no meds   Palpitations    a. 01/2019 [facility]): NSR w/ rare PACs & PVCs; b. 04/2020 Zio: Predominantly sinus rhythm @ 75 (48-141). Rare PACs/PVCs. No significant arrhythmias or prolonged pauses.  No triggered events.   PONV (postoperative nausea and vomiting)     Past Surgical History:  Procedure Laterality Date   ANTERIOR CRUCIATE LIGAMENT REPAIR     CARDIAC CATHETERIZATION     CHOLECYSTECTOMY  CORONARY ANGIOPLASTY     ESOPHAGOGASTRODUODENOSCOPY (EGD) WITH PROPOFOL N/A 08/28/2015   Procedure: ESOPHAGOGASTRODUODENOSCOPY (EGD) WITH PROPOFOL;  Surgeon: Elnita Maxwell, MD;  Location: [facility] ENDOSCOPY;  Service: Endoscopy;  Laterality: N/A;   ESOPHAGOGASTRODUODENOSCOPY (EGD) WITH PROPOFOL N/A 01/31/2022   Procedure: ESOPHAGOGASTRODUODENOSCOPY (EGD) WITH PROPOFOL;  Surgeon: Wyline Mood, MD;  Location: [facility] ENDOSCOPY;  Service: Gastroenterology;  Laterality: N/A;   KNEE SURGERY      left knee    LEFT HEART CATH AND CORONARY ANGIOGRAPHY Left 09/22/2018   Procedure: LEFT HEART CATH AND CORONARY ANGIOGRAPHY;  Surgeon: [name], MD;  Location: [facility] INVASIVE CV LAB;  Service: Cardiovascular;  Laterality: Left;   MASS EXCISION Left 01/26/2016   Procedure: EXCISION OF LEFT WRIST VOLAR MASS;  Surgeon: Dairl Ponder, MD;  Location: [facility];  Service: Orthopedics;  Laterality: Left;   NOSE SURGERY     POLYPECTOMY     TUBAL LIGATION     VIDEO BRONCHOSCOPY N/A 05/29/2015   Procedure: VIDEO BRONCHOSCOPY WITHOUT FLUORO;  Surgeon: Stephanie Acre, MD;  Location: [facility] ORS;  Service: Cardiopulmonary;  Laterality: N/A;    Family Psychiatric History: Reviewed family psychiatric history from progress note on 12/06/2022.  Family History:  Family History  Problem Relation Age of Onset   Dementia Mother    Alcohol abuse Mother    COPD Mother    High Cholesterol Mother    Heart failure Mother    Hypertension Mother    Dementia Father    Alcohol abuse Father    Hypertension Father    AAA (abdominal aortic aneurysm) Sister    COPD Sister    High Cholesterol Sister     Social History: Reviewed social history from progress note on 12/06/2022. Social History   Socioeconomic History   Marital status: Married    Spouse name: david   Number of children: 3   Years of education: Not on file   Highest education level: 10th grade  Occupational History   Not on file  Tobacco Use   Smoking status: Never   Smokeless tobacco: Never  Vaping Use   Vaping Use: Never used  Substance and Sexual Activity   Alcohol use: No    Alcohol/week: 0.0 standard drinks of alcohol   Drug use: No   Sexual activity: Yes  Other Topics Concern   Not on file  Social History Narrative   Not on file   Social Determinants of Health   Financial Resource Strain: Patient Declined (04/04/2023)   Overall Financial Resource Strain (CARDIA)    Difficulty of Paying Living Expenses:  Patient declined  Food Insecurity: Patient Declined (04/04/2023)   Hunger Vital Sign    Worried About Running Out of Food in the Last Year: Patient declined    Ran Out of Food in the Last Year: Patient declined  Transportation Needs: No Transportation Needs (04/04/2023)   PRAPARE - Administrator, Civil Service (Medical): No    Lack of Transportation (Non-Medical): No  Physical Activity: Sufficiently Active (04/04/2023)   Exercise Vital Sign    Days of Exercise per Week: 7 days    Minutes of Exercise per Session: 150+ min  Stress: No Stress Concern Present (04/04/2023)   [employer] of Occupational Health - Occupational Stress Questionnaire    Feeling of Stress : Only a little  Social Connections: Unknown (04/04/2023)   Social Connection and Isolation Panel [NHANES]    Frequency of Communication with Friends and Family: Patient declined    Frequency  of Social Gatherings with Friends and Family: Patient declined    Attends Religious Services: Patient declined    Database administrator or Organizations: No    Attends Banker Meetings: Never    Marital Status: Married    Allergies:  Allergies  Allergen Reactions   Cherry Swelling    Swelling of lips and throat   Other Shortness Of Breath    Stopped breathing, Swelling of throat, Stopped breathing, Swelling of throat, Stopped breathing, Swelling of throat   Shellfish Allergy Shortness Of Breath   Sulfa Antibiotics Shortness Of Breath    Stopped breathing, Swelling of throat   Bee Venom     Swelling of throat   Imdur [Isosorbide Nitrate]     Worsening headaches     Metabolic Disorder Labs: Lab Results  Component Value Date   HGBA1C 5.6 09/05/2022   No results found for: "PROLACTIN" Lab Results  Component Value Date   CHOL 121 04/18/2023   TRIG 81 04/18/2023   HDL 52 04/18/2023   CHOLHDL 2.3 04/18/2023   VLDL 16 04/18/2023   LDLCALC 53 04/18/2023   Lab Results  Component Value Date   TSH  2.212 04/18/2023   TSH 2.480 09/05/2022    Therapeutic Level Labs: Lab Results  Component Value Date   LITHIUM 0.77 12/09/2022   LITHIUM 0.5 09/05/2022   No results found for: "VALPROATE" No results found for: "CBMZ"  Current Medications: Current Outpatient Medications  Medication Sig Dispense Refill   potassium chloride SA (KLOR-CON M20) 20 MEQ tablet Take 1 tablet (20 mEq total) by mouth daily. 90 tablet 3   albuterol (VENTOLIN HFA) 108 (90 Base) MCG/ACT inhaler Inhale 1-2 puffs into the lungs every 6 (six) hours as needed for wheezing or shortness of breath. 18 g 11   aspirin EC 81 MG tablet Take 81 mg by mouth daily.     cetirizine (ZYRTEC) 10 MG tablet Take 1 tablet (10 mg total) by mouth daily. 30 tablet 11   clobetasol ointment (TEMOVATE) 0.05 % Apply 1 Application topically 2 (two) times daily. 30 g 0   clonazePAM (KLONOPIN) 0.5 MG tablet Take 1 tablet (0.5 mg total) by mouth as directed. Take 1 tablet three times a week only for severe anxiety attacks 21 tablet 1   dicyclomine (BENTYL) 10 MG capsule Take 1 capsule (10 mg total) by mouth 3 (three) times daily before meals. 90 capsule 2   diltiazem (CARDIZEM CD) 240 MG 24 hr capsule Take 1 capsule (240 mg total) by mouth daily. 90 capsule 3   diltiazem (CARDIZEM) 30 MG tablet Take 1 tablet (30 mg total) by mouth daily as needed (palpitations). 90 tablet 0   docusate sodium (COLACE) 100 MG capsule Take 100 mg by mouth 2 (two) times daily.     EPINEPHrine 0.3 mg/0.3 mL IJ SOAJ injection Inject 0.3 mg into the muscle as needed for anaphylaxis.     eszopiclone (LUNESTA) 1 MG TABS tablet Take 1 tablet (1 mg total) by mouth at bedtime. Take immediately before bedtime 90 tablet 0   famotidine (PEPCID) 20 MG tablet Take 1 tablet (20 mg total) by mouth at bedtime. 30 tablet 0   gabapentin (NEURONTIN) 400 MG capsule Take 400 mg by mouth 3 (three) times daily.     hydrOXYzine (VISTARIL) 25 MG capsule Take 1 capsule (25 mg total) by mouth 3  (three) times daily as needed for anxiety. And sleep 270 capsule 1   linaclotide (LINZESS) 290 MCG CAPS  capsule Take 1 capsule (290 mcg total) by mouth daily before breakfast. 90 capsule 3   lithium carbonate 300 MG capsule Take 600 mg by mouth at bedtime.     mometasone-formoterol (DULERA) 200-5 MCG/ACT AERO Inhale into the lungs.     nitroGLYCERIN (NITROSTAT) 0.4 MG SL tablet PLACE ONE TABLET UNDER THE TONGUE EVERY 5 MINUTES AS NEEDED FOR CHEST PAIN. MAXIMUM OF 3 DOSES. 25 tablet 3   nortriptyline (PAMELOR) 10 MG capsule Take 10 mg by mouth at bedtime.     omeprazole (PRILOSEC) 40 MG capsule Take 1 capsule (40 mg total) by mouth daily. 90 capsule 1   ondansetron (ZOFRAN ODT) 4 MG disintegrating tablet Take 1 tablet (4 mg total) by mouth every 8 (eight) hours as needed for nausea or vomiting. 12 tablet 0   predniSONE (DELTASONE) 10 MG tablet Day 1 & 2 take 6 tablets Day 3 &4 take 5 tablets Day 5 &6 take 4 tablets Day 7 & 8 take 3 tablets Day 9 & 10 take 2 tablets Day 11 & 12 take 1 tablet Day 13 & 14 take 1/2 tablet 44 tablet 0   rosuvastatin (CRESTOR) 10 MG tablet Take 1 tablet (10 mg total) by mouth at bedtime. 90 tablet 3   sertraline (ZOLOFT) 50 MG tablet Take 1 tablet (50 mg total) by mouth daily. 90 tablet 0   tiotropium (SPIRIVA) 18 MCG inhalation capsule Place into inhaler and inhale.     tiZANidine (ZANAFLEX) 4 MG tablet Take 1 tablet (4 mg total) by mouth at bedtime. 90 tablet 1   tretinoin (RETIN-A) 0.05 % cream Apply topically at bedtime. 45 g 0   triamcinolone cream (KENALOG) 0.1 % Apply 1 Application topically 2 (two) times daily. 30 g 0   No current facility-administered medications for this visit.     Musculoskeletal: Strength & Muscle Tone:  UTA Gait & Station:  Seated Patient leans: N/A  Psychiatric Specialty Exam: Review of Systems  Psychiatric/Behavioral: Negative.      Last menstrual period 09/24/2016.There is no height or weight on file to calculate BMI.  General  Appearance: Fairly Groomed  Eye Contact:  Fair  Speech:  Normal Rate  Volume:  Normal  Mood:  Euthymic  Affect:  Congruent  Thought Process:  Goal Directed and Descriptions of Associations: Intact  Orientation:  Full (Time, Place, and Person)  Thought Content: Logical   Suicidal Thoughts:  No  Homicidal Thoughts:  No  Memory:  Immediate;   Fair Recent;   Fair Remote;   Fair  Judgement:  Fair  Insight:  Fair  Psychomotor Activity:  Normal  Concentration:  Concentration: Fair and Attention Span: Fair  Recall:  [employer] of Knowledge: Fair  Language: Fair  Akathisia:  No  Handed:  Right  AIMS (if indicated): not done  Assets:  Communication Skills Desire for Improvement Housing Intimacy Social Support Talents/Skills  ADL's:  Intact  Cognition: WNL  Sleep:  Fair   Screenings: GAD-7    Flowsheet Row Video Visit from 02/18/2023 in [facility] Counselor from 02/12/2023 in [facility] Office Visit from 01/22/2023 in [facility] Office Visit from 12/06/2022 in [facility] Counselor from 12/02/2022 in [facility]  Total GAD-7 Score 9 6 12 12 18       PHQ2-9    Flowsheet Row Office Visit from 04/21/2023 in [facility]  Visit from 04/04/2023 in [facility] Video Visit from 02/18/2023 in [facility] Counselor from 02/12/2023 in [facility] Counselor from 01/27/2023 in [facility]  PHQ-2 Total Score 0 0 0 4 2  PHQ-9 Total Score 0 -- -- 16 6      Flowsheet Row Video Visit from 04/21/2023 in [facility] Video Visit from 02/18/2023 in [facility] Counselor from 02/12/2023 in [facility]  C-SSRS RISK CATEGORY No Risk No Risk No Risk        Assessment and Plan: Melinda Shaw is a 57 year old female on disability, lives in [place], has a history of PTSD, MDD, OCD, multiple medical problems including hepatitis was evaluated by telemedicine today.  Patient is currently stable.  Plan PTSD-stable Continue psychotherapy with Ms. Primus Bravo. Sertraline 50 mg p.o. daily. Lunesta 1 mg p.o. nightly for sleep Reviewed [place] PMP AWARxE  MDD in remission Lithium 600 mg p.o. daily at bedtime.  Patient could also use it in divided dosage. Lithium level-12/09/2022-0.77.  Therapeutic. Continue CBT Gabapentin as prescribed by her provider for pain. Sertraline 50 mg p.o. daily  OCD-improving Klonopin 0.5 mg few times a week only for severe panic attacks. Reviewed  PMP AWARxE Hydroxyzine 25 mg 3 times a day as needed Continue CBT Sertraline 50 mg p.o. daily  High risk medication use-will order a lithium level.  Patient to go to [facility] lab. Reviewed and discussed TSH-04/18/2023-2.212-within normal limits BUN/creatinine-within normal limits. Patient with hypokalemia-currently under the care of her providers currently on potassium replacement.  Follow-up in clinic in 3 months or sooner if needed.    Consent: Patient/Guardian gives verbal consent for treatment and assignment of benefits for services provided during this visit. Patient/Guardian expressed understanding and agreed to proceed.   This note was generated in part or whole with voice recognition software. Voice recognition is usually quite accurate but there are transcription errors that can and very often do occur. I apologize for any typographical errors that were not detected and corrected.      Jomarie Longs, MD 04/22/2023, 12:37 PM

## 2023-04-22 ENCOUNTER — Ambulatory Visit (INDEPENDENT_AMBULATORY_CARE_PROVIDER_SITE_OTHER): Payer: PPO | Admitting: Licensed Clinical Social Worker

## 2023-04-22 ENCOUNTER — Encounter: Payer: Self-pay | Admitting: Family Medicine

## 2023-04-22 DIAGNOSIS — J339 Nasal polyp, unspecified: Secondary | ICD-10-CM | POA: Insufficient documentation

## 2023-04-22 DIAGNOSIS — Z91199 Patient's noncompliance with other medical treatment and regimen due to unspecified reason: Secondary | ICD-10-CM

## 2023-04-22 DIAGNOSIS — L237 Allergic contact dermatitis due to plants, except food: Secondary | ICD-10-CM | POA: Insufficient documentation

## 2023-04-22 DIAGNOSIS — L509 Urticaria, unspecified: Secondary | ICD-10-CM | POA: Insufficient documentation

## 2023-04-22 NOTE — Assessment & Plan Note (Signed)
Acute, self limiting Encourage topical retin to assist as well as warm compresses Continue to monitor

## 2023-04-22 NOTE — Assessment & Plan Note (Signed)
Ongoing concern since 5/24; was treated with steroids and topical low dose steroid cream Continues to use home atarax to assist with itching Repeat steroid course at this time Continue to monitor healing Refer to derm if necessary Start on adjunct measures to assist with symptom mgmt

## 2023-04-22 NOTE — Progress Notes (Signed)
Pt canceled morning of appt due to poison oak reaction.

## 2023-04-30 ENCOUNTER — Other Ambulatory Visit
Admission: RE | Admit: 2023-04-30 | Discharge: 2023-04-30 | Disposition: A | Discharge status: Home / Self Care | Payer: PPO | Source: Ambulatory Visit | Attending: Psychiatry | Admitting: Psychiatry

## 2023-04-30 ENCOUNTER — Other Ambulatory Visit
Admission: RE | Admit: 2023-04-30 | Discharge: 2023-04-30 | Disposition: A | Discharge status: Home / Self Care | Payer: PPO | Source: Ambulatory Visit | Attending: Internal Medicine | Admitting: Internal Medicine

## 2023-04-30 DIAGNOSIS — F4310 Post-traumatic stress disorder, unspecified: Secondary | ICD-10-CM | POA: Insufficient documentation

## 2023-04-30 DIAGNOSIS — Z79899 Other long term (current) drug therapy: Secondary | ICD-10-CM | POA: Insufficient documentation

## 2023-04-30 DIAGNOSIS — F429 Obsessive-compulsive disorder, unspecified: Secondary | ICD-10-CM | POA: Insufficient documentation

## 2023-04-30 DIAGNOSIS — F3342 Major depressive disorder, recurrent, in full remission: Secondary | ICD-10-CM | POA: Insufficient documentation

## 2023-04-30 LAB — BASIC METABOLIC PANEL
Anion gap: 9 (ref 5–15)
BUN: <5 mg/dL — ABNORMAL LOW (ref 6–20)
CO2: 22 mmol/L (ref 22–32)
Calcium: 9 mg/dL (ref 8.9–10.3)
Chloride: 109 mmol/L (ref 98–111)
Creatinine, Ser: 0.82 mg/dL (ref 0.44–1.00)
GFR, Estimated: >60 mL/min (ref >60)
Glucose, Bld: 84 mg/dL (ref 70–99)
Potassium: 3.1 mmol/L — ABNORMAL LOW (ref 3.5–5.1)
Sodium: 140 mmol/L (ref 135–145)

## 2023-04-30 LAB — LITHIUM LEVEL: Lithium Lvl: 0.44 mmol/L — ABNORMAL LOW (ref 0.60–1.20)

## 2023-04-30 MED ORDER — POTASSIUM CHLORIDE CRYS ER 20 MEQ PO TBCR: 20.0000 meq | EXTENDED_RELEASE_TABLET | Freq: Two times a day (BID) | ORAL | 3 refills | Status: DC

## 2023-04-30 NOTE — Addendum Note (Signed)
Addended by: Parke Poisson on: 04/30/2023 05:04 PM   Modules accepted: Orders

## 2023-05-07 ENCOUNTER — Ambulatory Visit: Payer: PPO | Admitting: Licensed Clinical Social Worker

## 2023-05-07 DIAGNOSIS — F4310 Post-traumatic stress disorder, unspecified: Secondary | ICD-10-CM | POA: Diagnosis not present

## 2023-05-07 DIAGNOSIS — F429 Obsessive-compulsive disorder, unspecified: Secondary | ICD-10-CM

## 2023-05-16 ENCOUNTER — Telehealth: Payer: Self-pay | Admitting: Psychiatry

## 2023-05-16 DIAGNOSIS — Z79899 Other long term (current) drug therapy: Secondary | ICD-10-CM

## 2023-05-16 NOTE — Telephone Encounter (Signed)
Repeat lithium levels after she takes it daily for a few weeks.  Patient to go back to lab in 3 to 4 weeks to repeat lithium levels again..  I have placed lithium level order in the system for [facility] health lab.  Patient also to follow up with primary care provider for low potassium level.

## 2023-05-17 ENCOUNTER — Other Ambulatory Visit: Payer: Self-pay | Admitting: Family Medicine

## 2023-05-19 NOTE — Telephone Encounter (Signed)
Requested Prescriptions  Pending Prescriptions Disp Refills   famotidine (PEPCID) 20 MG tablet [Pharmacy Med Name: FAMOTIDINE 20 MG TABLET] 90 tablet 1    Sig: TAKE 1 TABLET BY MOUTH AT BEDTIME     Gastroenterology:  H2 Antagonists Passed - 05/17/2023  6:51 AM      Passed - Valid encounter within last 12 months    Recent Outpatient Visits           4 weeks ago Poison ivy dermatitis   [place] [facility] Merita Norton T, FNP   1 month ago Atopic dermatitis, unspecified type   [facility] Alfredia Ferguson, PA-C   4 months ago COPD, frequent exacerbations [facility])   [facility] Merita Norton T, FNP   5 months ago Acute streptococcal pharyngitis   [place] [facility] Merita Norton T, FNP   6 months ago COPD with acute exacerbation [facility])    [facility] Jacky Kindle, FNP       Future Appointments             In 3 weeks Jacky Kindle, FNP [facility]   In 5 months End, Cristal Deer, MD [facility]

## 2023-05-21 ENCOUNTER — Other Ambulatory Visit
Admission: RE | Admit: 2023-05-21 | Discharge: 2023-05-21 | Disposition: A | Discharge status: Home / Self Care | Payer: PPO | Source: Home / Self Care | Attending: Psychiatry | Admitting: Psychiatry

## 2023-05-21 ENCOUNTER — Encounter: Payer: Self-pay | Admitting: Internal Medicine

## 2023-05-21 ENCOUNTER — Other Ambulatory Visit
Admission: RE | Admit: 2023-05-21 | Discharge: 2023-05-21 | Disposition: A | Discharge status: Home / Self Care | Payer: PPO | Attending: Internal Medicine | Admitting: Internal Medicine

## 2023-05-21 DIAGNOSIS — Z79899 Other long term (current) drug therapy: Secondary | ICD-10-CM | POA: Insufficient documentation

## 2023-05-21 LAB — BASIC METABOLIC PANEL
Anion gap: 7 (ref 5–15)
BUN: 8 mg/dL (ref 6–20)
CO2: 23 mmol/L (ref 22–32)
Calcium: 9.7 mg/dL (ref 8.9–10.3)
Chloride: 109 mmol/L (ref 98–111)
Creatinine, Ser: 0.83 mg/dL (ref 0.44–1.00)
GFR, Estimated: >60 mL/min (ref >60)
Glucose, Bld: 91 mg/dL (ref 70–99)
Potassium: 4.5 mmol/L (ref 3.5–5.1)
Sodium: 139 mmol/L (ref 135–145)

## 2023-05-21 LAB — LITHIUM LEVEL: Lithium Lvl: 0.76 mmol/L (ref 0.60–1.20)

## 2023-05-22 ENCOUNTER — Other Ambulatory Visit: Payer: Self-pay

## 2023-05-22 ENCOUNTER — Ambulatory Visit: Payer: PPO

## 2023-05-22 DIAGNOSIS — R002 Palpitations: Secondary | ICD-10-CM

## 2023-05-22 NOTE — Telephone Encounter (Signed)
Please let Ms. Werling know that her [facility] readings show slightly high and low heart rates but no significant arrhythmia.  Given her persistent symptoms, I recommend that we obtain a 14 day event monitor to better characterize her heart rate and rhythm (Zio XT).  She should follow-up with me or an APP after completion of the monitor.  [name], MD [facility]

## 2023-05-24 DIAGNOSIS — R002 Palpitations: Secondary | ICD-10-CM | POA: Diagnosis not present

## 2023-06-09 ENCOUNTER — Ambulatory Visit: Payer: PPO | Admitting: Licensed Clinical Social Worker

## 2023-06-11 ENCOUNTER — Ambulatory Visit: Payer: PPO | Admitting: Family Medicine

## 2023-06-13 ENCOUNTER — Encounter: Payer: Self-pay | Admitting: Family Medicine

## 2023-06-13 ENCOUNTER — Ambulatory Visit (INDEPENDENT_AMBULATORY_CARE_PROVIDER_SITE_OTHER): Payer: PPO | Admitting: Family Medicine

## 2023-06-13 VITALS — BP 126/75 | HR 62 | Ht 60.0 in | Wt 150.9 lb

## 2023-06-13 DIAGNOSIS — F331 Major depressive disorder, recurrent, moderate: Secondary | ICD-10-CM | POA: Diagnosis not present

## 2023-06-13 DIAGNOSIS — F4310 Post-traumatic stress disorder, unspecified: Secondary | ICD-10-CM

## 2023-06-13 MED ORDER — BUSPIRONE HCL 10 MG PO TABS: 10.0000 mg | ORAL_TABLET | Freq: Three times a day (TID) | ORAL | 0 refills | Status: DC

## 2023-06-13 NOTE — Progress Notes (Unsigned)
Established patient visit   Patient: Melinda Shaw   DOB: 1966/05/06   57 y.o. Female  MRN: [id number] Visit Date: 06/13/2023  Today's healthcare provider: Jacky Kindle, FNP  Introduced to nurse practitioner role and practice setting.  All questions answered.  Discussed provider/patient relationship and expectations.  Subjective    HPI  ***  Medications: Outpatient Medications Prior to Visit  Medication Sig  . albuterol (VENTOLIN HFA) 108 (90 Base) MCG/ACT inhaler Inhale 1-2 puffs into the lungs every 6 (six) hours as needed for wheezing or shortness of breath.  [name] aspirin EC 81 MG tablet Take 81 mg by mouth daily.  . cetirizine (ZYRTEC) 10 MG tablet Take 1 tablet (10 mg total) by mouth daily.  [name] dicyclomine (BENTYL) 10 MG capsule Take 1 capsule (10 mg total) by mouth 3 (three) times daily before meals.  [name] diltiazem (CARDIZEM CD) 240 MG 24 hr capsule Take 1 capsule (240 mg total) by mouth daily.  [name] diltiazem (CARDIZEM) 30 MG tablet Take 1 tablet (30 mg total) by mouth daily as needed (palpitations).  . docusate sodium (COLACE) 100 MG capsule Take 100 mg by mouth 2 (two) times daily.  [name] EPINEPHrine 0.3 mg/0.3 mL IJ SOAJ injection Inject 0.3 mg into the muscle as needed for anaphylaxis.  [name] eszopiclone (LUNESTA) 1 MG TABS tablet Take 1 tablet (1 mg total) by mouth at bedtime. Take immediately before bedtime  . famotidine (PEPCID) 20 MG tablet TAKE 1 TABLET BY MOUTH AT BEDTIME  . gabapentin (NEURONTIN) 400 MG capsule Take 400 mg by mouth 3 (three) times daily.  . hydrOXYzine (VISTARIL) 25 MG capsule Take 1 capsule (25 mg total) by mouth 3 (three) times daily as needed for anxiety. And sleep  . lithium carbonate 300 MG capsule Take 600 mg by mouth at bedtime.  . mometasone-formoterol (DULERA) 200-5 MCG/ACT AERO Inhale into the lungs.  . nitroGLYCERIN (NITROSTAT) 0.4 MG SL tablet PLACE ONE TABLET UNDER THE TONGUE EVERY 5 MINUTES AS NEEDED FOR CHEST PAIN. MAXIMUM OF 3 DOSES.  [name]  nortriptyline (PAMELOR) 10 MG capsule Take 10 mg by mouth at bedtime.  [name] omeprazole (PRILOSEC) 40 MG capsule Take 1 capsule (40 mg total) by mouth daily.  . ondansetron (ZOFRAN ODT) 4 MG disintegrating tablet Take 1 tablet (4 mg total) by mouth every 8 (eight) hours as needed for nausea or vomiting.  . potassium chloride SA (KLOR-CON M20) 20 MEQ tablet Take 1 tablet (20 mEq total) by mouth 2 (two) times daily.  . predniSONE (DELTASONE) 10 MG tablet Day 1 & 2 take 6 tablets Day 3 &4 take 5 tablets Day 5 &6 take 4 tablets Day 7 & 8 take 3 tablets Day 9 & 10 take 2 tablets Day 11 & 12 take 1 tablet Day 13 & 14 take 1/2 tablet  . rosuvastatin (CRESTOR) 10 MG tablet Take 1 tablet (10 mg total) by mouth at bedtime.  . sertraline (ZOLOFT) 50 MG tablet Take 1 tablet (50 mg total) by mouth daily.  . clonazePAM (KLONOPIN) 0.5 MG tablet Take 1 tablet (0.5 mg total) by mouth as directed. Take 1 tablet three times a week only for severe anxiety attacks  . [DISCONTINUED] clobetasol ointment (TEMOVATE) 0.05 % Apply 1 Application topically 2 (two) times daily. (Patient not taking: Reported on 06/13/2023)  . [DISCONTINUED] linaclotide (LINZESS) 290 MCG CAPS capsule Take 1 capsule (290 mcg total) by mouth daily before breakfast. (Patient not taking: Reported on 06/13/2023)  . [DISCONTINUED] tiotropium (SPIRIVA)  18 MCG inhalation capsule Place into inhaler and inhale. (Patient not taking: Reported on 06/13/2023)  . [DISCONTINUED] tiZANidine (ZANAFLEX) 4 MG tablet Take 1 tablet (4 mg total) by mouth at bedtime. (Patient not taking: Reported on 06/13/2023)  . [DISCONTINUED] tretinoin (RETIN-A) 0.05 % cream Apply topically at bedtime. (Patient not taking: Reported on 06/13/2023)  . [DISCONTINUED] triamcinolone cream (KENALOG) 0.1 % Apply 1 Application topically 2 (two) times daily. (Patient not taking: Reported on 06/13/2023)   No facility-administered medications prior to visit.    Review of Systems  {Insert previous labs  (optional):23779}  {See past labs  Heme  Chem  Endocrine  Serology  Results Review (optional):1}   Objective    BP 126/75 (BP Location: Left Arm, Patient Position: Sitting, Cuff Size: Large)   Pulse 62   Ht 5' (1.524 m)   Wt 150 lb 14.4 oz (68.4 kg)   LMP 09/24/2016 (Approximate)   SpO2 100%   BMI 29.47 kg/m  {Insert last BP/Wt (optional):23777}  {See vitals history (optional):1}  Physical Exam  ***  No results found for any visits on 06/13/23.  Assessment & Plan     Problem List Items Addressed This Visit   None   No follow-ups on file.      Leilani Merl, FNP, have reviewed all documentation for this visit. The documentation on 06/13/23 for the exam, diagnosis, procedures, and orders are all accurate and complete.  Jacky Kindle, FNP  [facility] [phone] (phone) [phone] (fax)  [facility]

## 2023-06-14 ENCOUNTER — Encounter: Payer: Self-pay | Admitting: Family Medicine

## 2023-06-14 NOTE — Assessment & Plan Note (Addendum)
Chronic, variable Lithium stabilized per labs; pt notes previously labs included 2 held doses for labs Followed by Eappen MD Recommend start of buspar 10 mg up to TID PRN to assist with ongoing complaints F/u in 6-8 weeks or with specialist
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
## 2023-06-22 ENCOUNTER — Other Ambulatory Visit: Payer: Self-pay | Admitting: Psychiatry

## 2023-06-22 DIAGNOSIS — F4310 Post-traumatic stress disorder, unspecified: Secondary | ICD-10-CM

## 2023-06-22 DIAGNOSIS — F3342 Major depressive disorder, recurrent, in full remission: Secondary | ICD-10-CM

## 2023-06-23 NOTE — Progress Notes (Signed)
THERAPIST PROGRESS NOTE  Session Time: 11:00AM-11:55AM  Participation Level: Active  Behavioral Response: CasualAlertHopeless  Type of Therapy: Individual Therapy  Treatment Goals addressed:  Reduce frequency, intensity, and duration of depression symptoms so that daily functioning is improved  Increase coping skills to manage depression and improve ability to perform daily activities  Reduce overall frequency, intensity and duration of anxiety so that daily functioning is not impaired per pt self report 3 out of 5 sessions.   Recall traumatic events without becoming overwhelmed with negative emotions  ProgressTowards Goals: Progressing  Interventions: CBT, DBT, Motivational Interviewing, Solution Focused, Strength-based, and Other: ACT  Summary: Melinda Shaw is a 57 y.o. female who presents with with mixed symptoms of anxiety and depression due to a history of trauma and experiences with current stressors. Symptoms endorsed including but not limited to negative self affect,  tearfulness, fatigue, irregular sleep patterns, worry, difficulty controlling worry, mild obsessions, and mild compulsions. Patient oriented to person, place, and time. Pt denies SI/HI or A/V hallucinations. Pt was cooperative during visit and was engaged throughout the visit. Pt does not report any other concerns at the time of visit.  Patient reported that she has been overall feeling OK. Patient reported that her primary source of stress is getting close with her family again and feeling fearful that she'll lose her family again. Patient reported that she is working to get close with her niece first as this feels like a person that is more approachable to her and also she utilized therapeutic space to process her nervousness about pursuing a relationship with her niece.   Patient reported that she got lunch with her Sister over the weekend and felt that it was very awkward. Provide a patient space to  process this experience. Assisted patient and understanding trauma and its role in posing a barrier to reconnecting in relationships that have been previously hurtful or that have been removed. Assisted patient and understanding how her attachment trauma is interfering with her goals. Invited patient to practice self compassion during this time. Patient reported that she is continuing to work on understanding that she is not her best or her worst experiences.   LCSW provided mood monitoring and treatment progress review in the context of this episode of treatment. LCSW reviewed the pt's mood status since last session.   Pt is continuing to apply interventions/techniques learned in session into daily life situations. Pt is currently on track to meet goals utilizing interventions that are discussed in session. Treatment to continue as indicated. Personal growth and progress toward goals noted above.  Continued Recommendations as followed: Self-care behaviors, positive social engagements, focusing on positive physical and emotional wellness, and focusing on life/work balance.   Suicidal/Homicidal: Nowithout intent/plan  Therapist Response:  Provided pt education re: acceptance. Discussed how to make acceptance accessible at all parts of pt's healing journey.   LCSW practiced active listening to validate pt participation, build rapport, and create safe space for pt to feel heard as they are disclosing their thoughts and feelings.   Approached pt with strengths based perspective to assist pt in exploring strengths in moments of feeling low.   LCSW utilized therapeutic conversation skills informed by CBT, DBT, and ACT to expose pt to multiple ways of thinking about healing and to provide pt to access to multiple interventions.  Plan: Return again in 2 weeks.  Diagnosis: PTSD (post-traumatic stress disorder)  Obsessive-compulsive disorder, unspecified type    05/07/2023    1:02 PM 02/18/2023  10:43  AM 02/12/2023    3:17 PM 01/22/2023    1:41 PM  GAD 7 : Generalized Anxiety Score  Nervous, Anxious, on Edge 1 0 1 2  Control/stop worrying 0 0 0 1  Worry too much - different things 1 0 1 1  Trouble relaxing 3 3 1 3   Restless 3 3 1 3   Easily annoyed or irritable 1 3 1 2   Afraid - awful might happen 0 0 1 0  Total GAD 7 Score 9 9 6 12   Anxiety Difficulty Somewhat difficult Not difficult at all         06/13/2023    2:10 PM 05/07/2023    1:01 PM 04/21/2023    4:10 PM  Depression screen PHQ 2/9  Decreased Interest 0 0 0  Down, Depressed, Hopeless 1 0 0  PHQ - 2 Score 1 0 0  Altered sleeping 1 0 0  Tired, decreased energy 2 0 0  Change in appetite 2 1 0  Feeling bad or failure about yourself  0 0 0  Trouble concentrating 1 3 0  Moving slowly or fidgety/restless 0 0 0  Suicidal thoughts 0 0 0  PHQ-9 Score 7 4 0  Difficult doing work/chores Not difficult at all  Not difficult at all    Collaboration of Care: Psychiatrist AEB [name]  Patient/Guardian was advised Release of Information must be obtained prior to any record release in order to collaborate their care with an outside provider. Patient/Guardian was advised if they have not already done so to contact the registration department to sign all necessary forms in order for [place] to release information regarding their care.   Consent: Patient/Guardian gives verbal consent for treatment and assignment of benefits for services provided during this visit. Patient/Guardian expressed understanding and agreed to proceed.   Geoffry Paradise, LCSW 06/23/2023

## 2023-06-24 ENCOUNTER — Ambulatory Visit: Payer: PPO | Admitting: Family Medicine

## 2023-07-09 ENCOUNTER — Ambulatory Visit: Payer: PPO | Admitting: Nurse Practitioner

## 2023-07-09 ENCOUNTER — Other Ambulatory Visit: Payer: Self-pay | Admitting: Family Medicine

## 2023-07-10 NOTE — Telephone Encounter (Signed)
Refilled 06/13/23 # 90. Requested Prescriptions  Refused Prescriptions Disp Refills   busPIRone (BUSPAR) 10 MG tablet [Pharmacy Med Name: busPIRone HCL 10 MG TABLET] 90 tablet 0    Sig: TAKE 1 TABLET BY MOUTH 3 TIMES A DAY     Psychiatry: Anxiolytics/Hypnotics - Non-controlled Passed - 07/09/2023  6:21 AM      Passed - Valid encounter within last 12 months    Recent Outpatient Visits           3 weeks ago PTSD (post-traumatic stress disorder)   [place] [facility] Jacky Kindle, FNP   2 months ago Poison ivy dermatitis   [place] [facility] Merita Norton T, FNP   3 months ago Atopic dermatitis, unspecified type   [facility] Alfredia Ferguson, PA-C   6 months ago COPD, frequent exacerbations [facility])   [place] [facility] Merita Norton T, FNP   7 months ago Acute streptococcal pharyngitis    [facility] Jacky Kindle, FNP       Future Appointments             In 3 months End, Cristal Deer, MD [facility]

## 2023-07-25 ENCOUNTER — Other Ambulatory Visit: Payer: Self-pay | Admitting: Psychiatry

## 2023-07-25 DIAGNOSIS — F4310 Post-traumatic stress disorder, unspecified: Secondary | ICD-10-CM

## 2023-07-28 ENCOUNTER — Encounter: Payer: Self-pay | Admitting: Family Medicine

## 2023-07-28 ENCOUNTER — Ambulatory Visit (INDEPENDENT_AMBULATORY_CARE_PROVIDER_SITE_OTHER): Payer: PPO | Admitting: Family Medicine

## 2023-07-28 VITALS — BP 137/88 | HR 91 | Temp 99.0°F | Wt 148.9 lb

## 2023-07-28 DIAGNOSIS — T63301A Toxic effect of unspecified spider venom, accidental (unintentional), initial encounter: Secondary | ICD-10-CM | POA: Diagnosis not present

## 2023-07-28 DIAGNOSIS — U071 COVID-19: Secondary | ICD-10-CM | POA: Insufficient documentation

## 2023-07-28 DIAGNOSIS — R0989 Other specified symptoms and signs involving the circulatory and respiratory systems: Secondary | ICD-10-CM | POA: Diagnosis not present

## 2023-07-28 DIAGNOSIS — T63301S Toxic effect of unspecified spider venom, accidental (unintentional), sequela: Secondary | ICD-10-CM

## 2023-07-28 LAB — POC COVID19 BINAXNOW: SARS Coronavirus 2 Ag: POSITIVE — AB

## 2023-07-28 LAB — POCT RAPID STREP A (OFFICE): Rapid Strep A Screen: NEGATIVE

## 2023-07-28 MED ORDER — NIRMATRELVIR/RITONAVIR (PAXLOVID)TABLET: 3.0000 | ORAL_TABLET | Freq: Two times a day (BID) | ORAL | 0 refills | Status: AC

## 2023-07-28 NOTE — Patient Instructions (Signed)
Continue to monitor site of spider bite; no concern for infection at this time   Your symptoms and exam findings are most consistent with a viral upper respiratory infection. These usually run their course in 5-7 days. Unfortunately, antibiotics don't work against viruses and just increase your risk of other issues such as diarrhea, yeast infections, and resistant infections.  If you start feeling worse with facial pain, high fever, cough, shortness of breath or start feeling significantly worse, please call us right away to be further evaluated.   Some things that can make you feel better are: - Increased rest - Increasing Fluids - Acetaminophen / ibuprofen as needed for fever/pain.  - Salt water gargling, chloraseptic spray and throat lozenges - OTC pseudoephedrine.  - Mucinex.  - Saline sinus flushes or a neti pot.  - Humidifying the air.

## 2023-07-28 NOTE — Assessment & Plan Note (Signed)
Unknown risk factors; fever started 9/15 + POC test in office Discussed 5 day quarantine and use of antiviral Discussed other symptoms monitoring given chronic conditions Recommend spouse get tested as well

## 2023-07-28 NOTE — Progress Notes (Signed)
Established patient visit   Patient: Melinda Shaw   DOB: 1966/05/10   57 y.o. Female  MRN: [id number] Visit Date: 07/28/2023  Today's healthcare provider: Jacky Kindle, FNP  Introduced to nurse practitioner role and practice setting.  All questions answered.  Discussed provider/patient relationship and expectations.  Subjective    HPI HPI     Migraine    Additional comments: Patient reports she started feeling bad oh Thursday w/ a bad migraine & sore throat. Associated w/ nausea and vomiting Friday and coughing congestion and sneezing on Sunday w/ migraine worsening. She reports no vomiting since sat 12:00 lunch. Diarrhea began last night and has not eaten since Friday. Patient has taking mucinex for symptoms. Fever was as high was 102 Saturday night but has broken since then and was last at 98 using temporal monitor. Patient also reports facial soreness.        Comments   Patient bit by spider Thursday behind right ear and ear was hot. Spider known as halloween spider.       Last edited by Acey Lav, CMA on 07/28/2023  8:42 AM.      Medications: Outpatient Medications Prior to Visit  Medication Sig   albuterol (VENTOLIN HFA) 108 (90 Base) MCG/ACT inhaler Inhale 1-2 puffs into the lungs every 6 (six) hours as needed for wheezing or shortness of breath.   aspirin EC 81 MG tablet Take 81 mg by mouth daily.   busPIRone (BUSPAR) 10 MG tablet Take 1 tablet (10 mg total) by mouth 3 (three) times daily.   cetirizine (ZYRTEC) 10 MG tablet Take 1 tablet (10 mg total) by mouth daily.   clonazePAM (KLONOPIN) 0.5 MG tablet Take 1 tablet (0.5 mg total) by mouth as directed. Take 1 tablet three times a week only for severe anxiety attacks   dicyclomine (BENTYL) 10 MG capsule Take 1 capsule (10 mg total) by mouth 3 (three) times daily before meals.   diltiazem (CARDIZEM CD) 240 MG 24 hr capsule Take 1 capsule (240 mg total) by mouth daily.   diltiazem (CARDIZEM) 30 MG tablet Take  1 tablet (30 mg total) by mouth daily as needed (palpitations).   docusate sodium (COLACE) 100 MG capsule Take 100 mg by mouth 2 (two) times daily.   EPINEPHrine 0.3 mg/0.3 mL IJ SOAJ injection Inject 0.3 mg into the muscle as needed for anaphylaxis.   eszopiclone (LUNESTA) 1 MG TABS tablet TAKE 1 TABLET BY MOUTH IMMEDIATELY BEFORE BEDTIME   famotidine (PEPCID) 20 MG tablet TAKE 1 TABLET BY MOUTH AT BEDTIME   gabapentin (NEURONTIN) 400 MG capsule Take 400 mg by mouth 3 (three) times daily.   hydrOXYzine (VISTARIL) 25 MG capsule Take 1 capsule (25 mg total) by mouth 3 (three) times daily as needed for anxiety. And sleep   lithium carbonate 300 MG capsule Take 600 mg by mouth at bedtime.   mometasone-formoterol (DULERA) 200-5 MCG/ACT AERO Inhale into the lungs.   nitroGLYCERIN (NITROSTAT) 0.4 MG SL tablet PLACE ONE TABLET UNDER THE TONGUE EVERY 5 MINUTES AS NEEDED FOR CHEST PAIN. MAXIMUM OF 3 DOSES.   nortriptyline (PAMELOR) 10 MG capsule Take 10 mg by mouth at bedtime.   omeprazole (PRILOSEC) 40 MG capsule Take 1 capsule (40 mg total) by mouth daily.   ondansetron (ZOFRAN ODT) 4 MG disintegrating tablet Take 1 tablet (4 mg total) by mouth every 8 (eight) hours as needed for nausea or vomiting.   potassium chloride SA (KLOR-CON M20) 20 MEQ tablet Take  1 tablet (20 mEq total) by mouth 2 (two) times daily.   predniSONE (DELTASONE) 10 MG tablet Day 1 & 2 take 6 tablets Day 3 &4 take 5 tablets Day 5 &6 take 4 tablets Day 7 & 8 take 3 tablets Day 9 & 10 take 2 tablets Day 11 & 12 take 1 tablet Day 13 & 14 take 1/2 tablet   rosuvastatin (CRESTOR) 10 MG tablet Take 1 tablet (10 mg total) by mouth at bedtime.   sertraline (ZOLOFT) 50 MG tablet TAKE 1 TABLET BY MOUTH DAILY WITH BREAKFAST   No facility-administered medications prior to visit.     Objective    BP 137/88 (BP Location: Left Arm, Patient Position: Sitting, Cuff Size: Normal)   Pulse 91   Temp 99 F (37.2 C) (Oral)   Wt 148 lb 14.4 oz (67.5  kg)   LMP 09/24/2016 (Approximate)   BMI 29.08 kg/m   Physical Exam Vitals and nursing note reviewed.  Constitutional:      General: She is not in acute distress.    Appearance: Normal appearance. She is overweight. She is ill-appearing. She is not toxic-appearing or diaphoretic.  HENT:     Head: Normocephalic and atraumatic.      Comments: Site of spider bite Cardiovascular:     Rate and Rhythm: Normal rate and regular rhythm.     Pulses: Normal pulses.     Heart sounds: Normal heart sounds. No murmur heard.    No friction rub. No gallop.  Pulmonary:     Effort: Pulmonary effort is normal. No respiratory distress.     Breath sounds: Normal breath sounds. No stridor. No wheezing, rhonchi or rales.  Chest:     Chest wall: No tenderness.  Musculoskeletal:        General: No swelling, tenderness, deformity or signs of injury. Normal range of motion.     Right lower leg: No edema.     Left lower leg: No edema.  Skin:    General: Skin is warm and dry.     Capillary Refill: Capillary refill takes less than 2 seconds.     Coloration: Skin is not jaundiced or pale.     Findings: No bruising, erythema, lesion or rash.  Neurological:     General: No focal deficit present.     Mental Status: She is alert and oriented to person, place, and time. Mental status is at baseline.     Cranial Nerves: No cranial nerve deficit.     Sensory: No sensory deficit.     Motor: No weakness.     Coordination: Coordination normal.  Psychiatric:        Mood and Affect: Mood normal.        Behavior: Behavior normal.        Thought Content: Thought content normal.        Judgment: Judgment normal.     Results for orders placed or performed in visit on 07/28/23  POC COVID-19  Result Value Ref Range   SARS Coronavirus 2 Ag Positive (A) Negative  POCT rapid strep A  Result Value Ref Range   Rapid Strep A Screen Negative Negative    Assessment & Plan     Problem List Items Addressed This Visit        Other   COVID - Primary    Unknown risk factors; fever started 9/15 + POC test in office Discussed 5 day quarantine and use of antiviral Discussed other symptoms monitoring given chronic  conditions Recommend spouse get tested as well       Relevant Medications   nirmatrelvir/ritonavir (PAXLOVID) 20 x 150 MG & 10 x 100MG  TABS   Spider bite    R posterior ear; no current s/s of infection Continue to monitor site  Non venomous spider       Upper respiratory symptom    POC strep negative; both URI and GI symptoms. Poor appetite, febrile.  COVID +      Relevant Orders   POC COVID-19 (Completed)   POCT rapid strep A (Completed)   Return if symptoms worsen or fail to improve.     Leilani Merl, FNP, have reviewed all documentation for this visit. The documentation on 07/28/23 for the exam, diagnosis, procedures, and orders are all accurate and complete.  Jacky Kindle, FNP  [facility] [phone] (phone) [phone] (fax)  [facility]

## 2023-07-28 NOTE — Assessment & Plan Note (Signed)
R posterior ear; no current s/s of infection Continue to monitor site  Non venomous spider

## 2023-07-28 NOTE — Assessment & Plan Note (Signed)
POC strep negative; both URI and GI symptoms. Poor appetite, febrile.  COVID +

## 2023-07-30 ENCOUNTER — Telehealth: Payer: Self-pay

## 2023-07-30 NOTE — Telephone Encounter (Signed)
Called pt and discussed SOB and diarrhea. Pt SOB with exertion only. Shortness of breath is the same:   continue to monitor at home  If symptoms become severe, i.e. shortness of breath at rest, gasping for air, wheezing, call 911 and seek treatment in the ED    If diarrhea remains the same:  encourage patient to drink oral fluids and bland foods.  Avoid alcohol, spicy foods, caffeine or fatty foods that could make diarrhea worse.  Continue to monitor for signs of dehydration (increased thirst decreased urine output, yellow urine, dry skin, headache or dizziness).  Advise patient to try OTC medication (Imodium, kaopectate, Pepto-Bismol) as per manufacturer's instructions.   If worsening diarrhea occurs and becomes severe (6-7 bowel movements a day): notify PCP  If diarrhea last greater than 7 days: notify PCP If signs of dehydration occur (increased thirst, decreased urine output, yellow urine, dry skin, headache or dizziness) advise patient to call 911 and seek treatment in the ED

## 2023-07-31 ENCOUNTER — Telehealth: Payer: Self-pay

## 2023-07-31 ENCOUNTER — Encounter (INDEPENDENT_AMBULATORY_CARE_PROVIDER_SITE_OTHER): Payer: Self-pay

## 2023-07-31 NOTE — Telephone Encounter (Signed)
Pt gagging from phlegm: develops severe vomiting (more than 6 times a day and or >8 hours) and/or severe abdominal pain advise patient If appetite becomes worse:   encourage patient to drink fluids as tolerated, work their way up to bland solid food such as crackers, pretzels, soup, bread or applesauce and boiled starches.  If patient is unable to tolerate any foods or liquids, notify PCP.  If patient to call 911 and seek treatment in ED

## 2023-08-04 ENCOUNTER — Encounter: Payer: Self-pay | Admitting: Family Medicine

## 2023-08-05 ENCOUNTER — Other Ambulatory Visit: Payer: Self-pay | Admitting: Family Medicine

## 2023-08-08 ENCOUNTER — Ambulatory Visit (INDEPENDENT_AMBULATORY_CARE_PROVIDER_SITE_OTHER): Payer: PPO | Admitting: Family Medicine

## 2023-08-08 ENCOUNTER — Encounter: Payer: Self-pay | Admitting: Family Medicine

## 2023-08-08 ENCOUNTER — Telehealth: Payer: Self-pay | Admitting: Family Medicine

## 2023-08-08 VITALS — BP 139/80 | HR 60 | Temp 98.6°F | Wt 152.3 lb

## 2023-08-08 DIAGNOSIS — L403 Pustulosis palmaris et plantaris: Secondary | ICD-10-CM | POA: Diagnosis not present

## 2023-08-08 DIAGNOSIS — J441 Chronic obstructive pulmonary disease with (acute) exacerbation: Secondary | ICD-10-CM | POA: Diagnosis not present

## 2023-08-08 MED ORDER — AMOXICILLIN-POT CLAVULANATE 875-125 MG PO TABS
1.0000 | ORAL_TABLET | Freq: Two times a day (BID) | ORAL | 0 refills | Status: DC
Start: 2023-08-08 — End: 2023-10-06

## 2023-08-08 MED ORDER — METHYLPREDNISOLONE 4 MG PO TBPK
ORAL_TABLET | ORAL | 0 refills | Status: DC
Start: 2023-08-08 — End: 2023-10-06

## 2023-08-08 NOTE — Progress Notes (Signed)
Established patient visit   Patient: Melinda Shaw   DOB: 04-Feb-1966   57 y.o. Female  MRN: [id number] Visit Date: 08/08/2023  Today's healthcare provider: Jacky Kindle, FNP  Introduced to nurse practitioner role and practice setting.  All questions answered.  Discussed provider/patient relationship and expectations.  Subjective    URI    HPI     URI   Associated symptoms inlclude congestion and shortness of breath (Facial pressure, weakness and migraine).  Recent episode started 1 to 4 weeks ago.  The problem has been gradually improving since onset.  The temperature has been with in normal range.  Patient  is drinking plenty of fluids.  PMH includes: COVID-19.  Patient is not a smoker. Additional comments: Paxlovid completed 08/02/23 and patient has been taking mucinex for symptoms and stopped robitussin 4 days ago when her cough stopped      Last edited by Acey Lav, CMA on 08/08/2023 11:08 AM.      Medications: Outpatient Medications Prior to Visit  Medication Sig   albuterol (VENTOLIN HFA) 108 (90 Base) MCG/ACT inhaler Inhale 1-2 puffs into the lungs every 6 (six) hours as needed for wheezing or shortness of breath.   aspirin EC 81 MG tablet Take 81 mg by mouth daily.   busPIRone (BUSPAR) 10 MG tablet Take 1 tablet (10 mg total) by mouth 3 (three) times daily.   cetirizine (ZYRTEC) 10 MG tablet Take 1 tablet (10 mg total) by mouth daily.   clonazePAM (KLONOPIN) 0.5 MG tablet Take 1 tablet (0.5 mg total) by mouth as directed. Take 1 tablet three times a week only for severe anxiety attacks   dicyclomine (BENTYL) 10 MG capsule Take 1 capsule (10 mg total) by mouth 3 (three) times daily before meals.   diltiazem (CARDIZEM CD) 240 MG 24 hr capsule Take 1 capsule (240 mg total) by mouth daily.   diltiazem (CARDIZEM) 30 MG tablet Take 1 tablet (30 mg total) by mouth daily as needed (palpitations).   docusate sodium (COLACE) 100 MG capsule Take 100 mg by mouth 2 (two)  times daily.   EPINEPHrine 0.3 mg/0.3 mL IJ SOAJ injection Inject 0.3 mg into the muscle as needed for anaphylaxis.   eszopiclone (LUNESTA) 1 MG TABS tablet TAKE 1 TABLET BY MOUTH IMMEDIATELY BEFORE BEDTIME   famotidine (PEPCID) 20 MG tablet TAKE 1 TABLET BY MOUTH AT BEDTIME   gabapentin (NEURONTIN) 400 MG capsule Take 400 mg by mouth 3 (three) times daily.   hydrOXYzine (VISTARIL) 25 MG capsule Take 1 capsule (25 mg total) by mouth 3 (three) times daily as needed for anxiety. And sleep   lithium carbonate 300 MG capsule Take 600 mg by mouth at bedtime.   mometasone-formoterol (DULERA) 200-5 MCG/ACT AERO Inhale into the lungs.   nitroGLYCERIN (NITROSTAT) 0.4 MG SL tablet PLACE ONE TABLET UNDER THE TONGUE EVERY 5 MINUTES AS NEEDED FOR CHEST PAIN. MAXIMUM OF 3 DOSES.   nortriptyline (PAMELOR) 10 MG capsule Take 10 mg by mouth at bedtime.   omeprazole (PRILOSEC) 40 MG capsule Take 1 capsule (40 mg total) by mouth daily.   ondansetron (ZOFRAN ODT) 4 MG disintegrating tablet Take 1 tablet (4 mg total) by mouth every 8 (eight) hours as needed for nausea or vomiting.   potassium chloride SA (KLOR-CON M20) 20 MEQ tablet Take 1 tablet (20 mEq total) by mouth 2 (two) times daily.   rosuvastatin (CRESTOR) 10 MG tablet Take 1 tablet (10 mg total) by mouth at bedtime.  sertraline (ZOLOFT) 50 MG tablet TAKE 1 TABLET BY MOUTH DAILY WITH BREAKFAST   [DISCONTINUED] predniSONE (DELTASONE) 10 MG tablet Day 1 & 2 take 6 tablets Day 3 &4 take 5 tablets Day 5 &6 take 4 tablets Day 7 & 8 take 3 tablets Day 9 & 10 take 2 tablets Day 11 & 12 take 1 tablet Day 13 & 14 take 1/2 tablet   No facility-administered medications prior to visit.     Objective    BP 139/80   Pulse 60   Temp 98.6 F (37 C) (Oral)   Wt 152 lb 4.8 oz (69.1 kg)   LMP 09/24/2016 (Approximate)   BMI 29.74 kg/m   Physical Exam Vitals and nursing note reviewed.  Constitutional:      General: She is not in acute distress.    Appearance:  Normal appearance. She is overweight. She is ill-appearing. She is not toxic-appearing or diaphoretic.  HENT:     Head: Normocephalic and atraumatic.     Right Ear: Tympanic membrane, ear canal and external ear normal.     Left Ear: Tympanic membrane, ear canal and external ear normal.     Nose:     Right Sinus: Maxillary sinus tenderness and frontal sinus tenderness present.     Left Sinus: Maxillary sinus tenderness and frontal sinus tenderness present.     Mouth/Throat:     Mouth: Mucous membranes are moist.     Pharynx: Oropharynx is clear.  Eyes:     Conjunctiva/sclera: Conjunctivae normal.  Cardiovascular:     Rate and Rhythm: Normal rate and regular rhythm.     Pulses: Normal pulses.     Heart sounds: Normal heart sounds. No murmur heard.    No friction rub. No gallop.  Pulmonary:     Effort: Pulmonary effort is normal. No respiratory distress.     Breath sounds: Decreased air movement present. No stridor. Examination of the right-upper field reveals wheezing. Examination of the left-upper field reveals wheezing. Wheezing present. No rhonchi or rales.  Chest:     Chest wall: No tenderness.  Musculoskeletal:        General: No swelling, tenderness, deformity or signs of injury. Normal range of motion.     Right lower leg: No edema.     Left lower leg: No edema.  Skin:    General: Skin is warm and dry.     Capillary Refill: Capillary refill takes less than 2 seconds.     Coloration: Skin is not jaundiced or pale.     Findings: No bruising, erythema, lesion or rash.  Neurological:     General: No focal deficit present.     Mental Status: She is alert and oriented to person, place, and time. Mental status is at baseline.     Cranial Nerves: No cranial nerve deficit.     Sensory: No sensory deficit.     Motor: No weakness.     Coordination: Coordination normal.  Psychiatric:        Mood and Affect: Mood normal.        Behavior: Behavior normal.        Thought Content:  Thought content normal.        Judgment: Judgment normal.     No results found for any visits on 08/08/23.  Assessment & Plan     Problem List Items Addressed This Visit       Respiratory   COPD with acute exacerbation (HCC) - Primary  Post covid exacerbation; not improved with paxlovid and supportive meds Requesting for abx and steroids to assist Able to move air; however, upper wheezing near trachea and overall fatigue      Relevant Medications   amoxicillin-clavulanate (AUGMENTIN) 875-125 MG tablet   methylPREDNISolone (MEDROL DOSEPAK) 4 MG TBPK tablet     Musculoskeletal and Integument   Pustular psoriasis of sole of foot    Acute on chronic, L foot; request to see specialist Reports itching; however, Rx cream provides some relief but does not resolve      Relevant Orders   Ambulatory referral to Podiatry    Return if symptoms worsen or fail to improve.      Leilani Merl, FNP, have reviewed all documentation for this visit. The documentation on 08/08/23 for the exam, diagnosis, procedures, and orders are all accurate and complete.  Jacky Kindle, FNP  [facility] [phone] (phone) [phone] (fax)  [facility]

## 2023-08-08 NOTE — Assessment & Plan Note (Signed)
Acute on chronic, L foot; request to see specialist Reports itching; however, Rx cream provides some relief but does not resolve

## 2023-08-08 NOTE — Telephone Encounter (Signed)
Called patient in regard to COVID questionnaire to offer care advice. No answer. Sent care advice to mychart. Patient has an OV today to discuss symptoms.

## 2023-08-08 NOTE — Assessment & Plan Note (Signed)
Post covid exacerbation; not improved with paxlovid and supportive meds Requesting for abx and steroids to assist Able to move air; however, upper wheezing near trachea and overall fatigue

## 2023-08-14 ENCOUNTER — Encounter: Payer: Self-pay | Admitting: Psychiatry

## 2023-08-14 ENCOUNTER — Telehealth: Payer: PPO | Admitting: Psychiatry

## 2023-08-14 DIAGNOSIS — F3342 Major depressive disorder, recurrent, in full remission: Secondary | ICD-10-CM | POA: Diagnosis not present

## 2023-08-14 DIAGNOSIS — Z79899 Other long term (current) drug therapy: Secondary | ICD-10-CM

## 2023-08-14 DIAGNOSIS — F429 Obsessive-compulsive disorder, unspecified: Secondary | ICD-10-CM | POA: Diagnosis not present

## 2023-08-14 DIAGNOSIS — F4310 Post-traumatic stress disorder, unspecified: Secondary | ICD-10-CM

## 2023-08-14 NOTE — Progress Notes (Signed)
Virtual Visit via Video Note  I connected with Pranavi Aure Alkins on 08/14/23 at  1:00 PM EDT by a video enabled telemedicine application and verified that I am speaking with the correct person using two identifiers.  Location Provider Location : [facility] Patient Location : Home  Participants: Patient , Provider   I discussed the limitations of evaluation and management by telemedicine and the availability of in person appointments. The patient expressed understanding and agreed to proceed.   I discussed the assessment and treatment plan with the patient. The patient was provided an opportunity to ask questions and all were answered. The patient agreed with the plan and demonstrated an understanding of the instructions.   The patient was advised to call back or seek an in-person evaluation if the symptoms worsen or if the condition fails to improve as anticipated.    BH MD OP Progress Note  08/15/2023 2:17 PM ALESSANDRIA HENKEN  MRN:  [id number]  Chief Complaint:  Chief Complaint  Patient presents with   Follow-up   Anxiety   Depression   Medication Refill   HPI: GEORGI TUEL is a 57 year old female who lives in [place], married, on disability, has a history of PTSD, MDD, OCD, hypertension, COPD, history of CVA on ASA, fibromyalgia, history of appendiceal neoplasm, history of prior ileocecectomy, history of hepatitis, was evaluated by telemedicine today.  Patient today reports she had COVID-19 infection mid-September.  Soon after that she developed ear infection.  She was treated with Paxlovid initially and thereafter methylprednisone dosepak as well as Augmentin was added.  She just completed the steroid Dosepak yesterday.  She reports she continues to have antibiotics left which she continues to take.  She reports she continues to struggle with fatigue, mild dizziness likely from medications as well as her recent ear infection.  She hence is taking her time to rest and get through  this.  Patient reports overall mood symptoms as stable except for her anxiety about her current health problems.  Patient reports sleep is restless due to being sick otherwise Lunesta does help.  She is compliant on her medications like lithium, sertraline, Lunesta, Klonopin as needed and hydroxyzine as needed.  Denies side effects.  She however wonders if she could be tapered off of the lithium since she is worried about long-term side effects.  Patient continues to have clonazepam as needed only for anxiety attacks and has been limiting use.  Denies any suicidality, homicidality or perceptual disturbances.  Patient denies any other concerns today.  Visit Diagnosis:    ICD-10-CM   1. PTSD (post-traumatic stress disorder)  F43.10     2. Recurrent major depressive disorder, in full remission (HCC)  F33.42     3. Obsessive-compulsive disorder with good or fair insight  F42.9     4. High risk medication use  Z79.899 Lithium level    BUN    Creatinine, serum      Past Psychiatric History: I have reviewed past psychiatric history from progress note on 12/06/2022.  Past trials of medications-Seroquel-multiple others.  Past Medical History:  Past Medical History:  Diagnosis Date   Anemia    H/O   Anxiety    Bronchitis    Chest pain    a. 09/2018 Cath: Nl cors. EF 65%->Med rx (long-acting nitrate added).   Depression    Diastolic dysfunction    a. 10/2018 Echo: EF 55-60%, no rwma, Gr1DD. Mild MR. Nl RV fxn.   DOE (dyspnea on exertion)  Fibromyalgia    GERD (gastroesophageal reflux disease)    Headache    Hyperlipidemia    Hypertension    no meds   Palpitations    a. 01/2019 [facility]): NSR w/ rare PACs & PVCs; b. 04/2020 [facility]: Predominantly sinus rhythm @ 75 (48-141). Rare PACs/PVCs. No significant arrhythmias or prolonged pauses.  No triggered events.   PONV (postoperative nausea and vomiting)     Past Surgical History:  Procedure Laterality Date   ANTERIOR CRUCIATE  LIGAMENT REPAIR     CARDIAC CATHETERIZATION     CHOLECYSTECTOMY     CORONARY ANGIOPLASTY     ESOPHAGOGASTRODUODENOSCOPY (EGD) WITH PROPOFOL N/A 08/28/2015   Procedure: ESOPHAGOGASTRODUODENOSCOPY (EGD) WITH PROPOFOL;  Surgeon: Elnita Maxwell, MD;  Location: [facility] ENDOSCOPY;  Service: Endoscopy;  Laterality: N/A;   ESOPHAGOGASTRODUODENOSCOPY (EGD) WITH PROPOFOL N/A 01/31/2022   Procedure: ESOPHAGOGASTRODUODENOSCOPY (EGD) WITH PROPOFOL;  Surgeon: Wyline Mood, MD;  Location: [facility] ENDOSCOPY;  Service: Gastroenterology;  Laterality: N/A;   KNEE SURGERY     left knee    LEFT HEART CATH AND CORONARY ANGIOGRAPHY Left 09/22/2018   Procedure: LEFT HEART CATH AND CORONARY ANGIOGRAPHY;  Surgeon: [name], MD;  Location: [facility] INVASIVE CV LAB;  Service: Cardiovascular;  Laterality: Left;   MASS EXCISION Left 01/26/2016   Procedure: EXCISION OF LEFT WRIST VOLAR MASS;  Surgeon: Dairl Ponder, MD;  Location: [facility];  Service: Orthopedics;  Laterality: Left;   NOSE SURGERY     POLYPECTOMY     TUBAL LIGATION     VIDEO BRONCHOSCOPY N/A 05/29/2015   Procedure: VIDEO BRONCHOSCOPY WITHOUT FLUORO;  Surgeon: Stephanie Acre, MD;  Location: [facility] ORS;  Service: Cardiopulmonary;  Laterality: N/A;    Family Psychiatric History: I have reviewed family psychiatric history from progress note on 12/06/2022.  Family History:  Family History  Problem Relation Age of Onset   Dementia Mother    Alcohol abuse Mother    COPD Mother    High Cholesterol Mother    Heart failure Mother    Hypertension Mother    Dementia Father    Alcohol abuse Father    Hypertension Father    AAA (abdominal aortic aneurysm) Sister    COPD Sister    High Cholesterol Sister     Social History: I have reviewed social history from progress note on 12/06/2022. Social History   Socioeconomic History   Marital status: Married    Spouse name: david   Number of children: 3   Years of education: Not on file    Highest education level: 10th grade  Occupational History   Not on file  Tobacco Use   Smoking status: Never   Smokeless tobacco: Never  Vaping Use   Vaping status: Never Used  Substance and Sexual Activity   Alcohol use: No    Alcohol/week: 0.0 standard drinks of alcohol   Drug use: No   Sexual activity: Yes  Other Topics Concern   Not on file  Social History Narrative   Not on file   Social Determinants of Health   Financial Resource Strain: Patient Declined (04/04/2023)   Overall Financial Resource Strain (CARDIA)    Difficulty of Paying Living Expenses: Patient declined  Food Insecurity: Patient Declined (04/04/2023)   Hunger Vital Sign    Worried About Running Out of Food in the Last Year: Patient declined    Ran Out of Food in the Last Year: Patient declined  Transportation Needs: No Transportation Needs (04/04/2023)   PRAPARE -  Administrator, Civil Service (Medical): No    Lack of Transportation (Non-Medical): No  Physical Activity: Sufficiently Active (04/04/2023)   Exercise Vital Sign    Days of Exercise per Week: 7 days    Minutes of Exercise per Session: 150+ min  Stress: No Stress Concern Present (04/04/2023)   [employer] of Occupational Health - Occupational Stress Questionnaire    Feeling of Stress : Only a little  Social Connections: Unknown (04/04/2023)   Social Connection and Isolation Panel [NHANES]    Frequency of Communication with Friends and Family: Patient declined    Frequency of Social Gatherings with Friends and Family: Patient declined    Attends Religious Services: Patient declined    Database administrator or Organizations: No    Attends Banker Meetings: Never    Marital Status: Married    Allergies:  Allergies  Allergen Reactions   Cherry Swelling    Swelling of lips and throat   Other Shortness Of Breath    Stopped breathing, Swelling of throat, Stopped breathing, Swelling of throat, Stopped breathing,  Swelling of throat   Shellfish Allergy Shortness Of Breath   Sulfa Antibiotics Shortness Of Breath    Stopped breathing, Swelling of throat   Bee Venom     Swelling of throat   Imdur [Isosorbide Nitrate]     Worsening headaches     Metabolic Disorder Labs: Lab Results  Component Value Date   HGBA1C 5.6 09/05/2022   No results found for: "PROLACTIN" Lab Results  Component Value Date   CHOL 121 04/18/2023   TRIG 81 04/18/2023   HDL 52 04/18/2023   CHOLHDL 2.3 04/18/2023   VLDL 16 04/18/2023   LDLCALC 53 04/18/2023   Lab Results  Component Value Date   TSH 2.212 04/18/2023   TSH 2.480 09/05/2022    Therapeutic Level Labs: Lab Results  Component Value Date   LITHIUM 0.76 05/21/2023   LITHIUM 0.44 (L) 04/30/2023   No results found for: "VALPROATE" No results found for: "CBMZ"  Current Medications: Current Outpatient Medications  Medication Sig Dispense Refill   albuterol (VENTOLIN HFA) 108 (90 Base) MCG/ACT inhaler Inhale 1-2 puffs into the lungs every 6 (six) hours as needed for wheezing or shortness of breath. 18 g 11   amoxicillin-clavulanate (AUGMENTIN) 875-125 MG tablet Take 1 tablet by mouth 2 (two) times daily. 20 tablet 0   aspirin EC 81 MG tablet Take 81 mg by mouth daily.     busPIRone (BUSPAR) 10 MG tablet Take 1 tablet (10 mg total) by mouth 3 (three) times daily. 90 tablet 0   cetirizine (ZYRTEC) 10 MG tablet Take 1 tablet (10 mg total) by mouth daily. 30 tablet 11   clonazePAM (KLONOPIN) 0.5 MG tablet Take 1 tablet (0.5 mg total) by mouth as directed. Take 1 tablet three times a week only for severe anxiety attacks 21 tablet 1   dicyclomine (BENTYL) 10 MG capsule Take 1 capsule (10 mg total) by mouth 3 (three) times daily before meals. 90 capsule 2   diltiazem (CARDIZEM CD) 240 MG 24 hr capsule Take 1 capsule (240 mg total) by mouth daily. 90 capsule 3   diltiazem (CARDIZEM) 30 MG tablet Take 1 tablet (30 mg total) by mouth daily as needed (palpitations).  90 tablet 0   docusate sodium (COLACE) 100 MG capsule Take 100 mg by mouth 2 (two) times daily.     EPINEPHrine 0.3 mg/0.3 mL IJ SOAJ injection Inject 0.3 mg  into the muscle as needed for anaphylaxis.     eszopiclone (LUNESTA) 1 MG TABS tablet TAKE 1 TABLET BY MOUTH IMMEDIATELY BEFORE BEDTIME 90 tablet 1   famotidine (PEPCID) 20 MG tablet TAKE 1 TABLET BY MOUTH AT BEDTIME 90 tablet 0   gabapentin (NEURONTIN) 400 MG capsule Take 400 mg by mouth 3 (three) times daily.     hydrOXYzine (VISTARIL) 25 MG capsule Take 1 capsule (25 mg total) by mouth 3 (three) times daily as needed for anxiety. And sleep 270 capsule 1   lithium carbonate 300 MG capsule Take 600 mg by mouth at bedtime.     methylPREDNISolone (MEDROL DOSEPAK) 4 MG TBPK tablet Take as directed on package with food/milk in mornings 1 each 0   mometasone-formoterol (DULERA) 200-5 MCG/ACT AERO Inhale into the lungs.     nitroGLYCERIN (NITROSTAT) 0.4 MG SL tablet PLACE ONE TABLET UNDER THE TONGUE EVERY 5 MINUTES AS NEEDED FOR CHEST PAIN. MAXIMUM OF 3 DOSES. 25 tablet 3   nortriptyline (PAMELOR) 10 MG capsule Take 10 mg by mouth at bedtime.     omeprazole (PRILOSEC) 40 MG capsule Take 1 capsule (40 mg total) by mouth daily. 90 capsule 1   ondansetron (ZOFRAN ODT) 4 MG disintegrating tablet Take 1 tablet (4 mg total) by mouth every 8 (eight) hours as needed for nausea or vomiting. 12 tablet 0   potassium chloride SA (KLOR-CON M20) 20 MEQ tablet Take 1 tablet (20 mEq total) by mouth 2 (two) times daily. 180 tablet 3   rosuvastatin (CRESTOR) 10 MG tablet Take 1 tablet (10 mg total) by mouth at bedtime. 90 tablet 3   sertraline (ZOLOFT) 50 MG tablet TAKE 1 TABLET BY MOUTH DAILY WITH BREAKFAST 90 tablet 0   No current facility-administered medications for this visit.     Musculoskeletal: Strength & Muscle Tone:  UTA Gait & Station: normal Patient leans: N/A  Psychiatric Specialty Exam: Review of Systems  Constitutional:  Positive for  fatigue.  Neurological:  Positive for dizziness (due being recently sick with covid, ear infection).  Psychiatric/Behavioral:  Positive for sleep disturbance. The patient is nervous/anxious.     Last menstrual period 09/24/2016.There is no height or weight on file to calculate BMI.  General Appearance: Casual  Eye Contact:  Fair  Speech:  Clear and Coherent  Volume:  Normal  Mood:  Anxious  Affect:  Congruent  Thought Process:  Goal Directed and Descriptions of Associations: Intact  Orientation:  Full (Time, Place, and Person)  Thought Content: Logical   Suicidal Thoughts:  No  Homicidal Thoughts:  No  Memory:  Immediate;   Fair Recent;   Fair Remote;   Fair  Judgement:  Fair  Insight:  Fair  Psychomotor Activity:  Normal  Concentration:  Concentration: Fair and Attention Span: Fair  Recall:  [employer] of Knowledge: Fair  Language: Fair  Akathisia:  No  Handed:  Right  AIMS (if indicated): not done  Assets:  Communication Skills Desire for Improvement Housing Social Support  ADL's:  Intact  Cognition: WNL  Sleep:   Restless due to being sick   Screenings: GAD-7    Advertising copywriter from 05/07/2023 in [facility] Video Visit from 02/18/2023 in [facility] Counselor from 02/12/2023 in [facility] Office Visit from 01/22/2023 in [facility] Office Visit from 12/06/2022 in [facility]  Total GAD-7 Score 9  9 6 12 12       PHQ2-9    Flowsheet Row Office Visit from 06/13/2023 in [facility] Counselor from 05/07/2023 in [facility] Office Visit from 04/21/2023 in [facility] Office Visit from 04/04/2023 in [facility] Video Visit from 02/18/2023 in [facility]  PHQ-2 Total Score 1 0 0 0 0  PHQ-9 Total Score 7 4 0 -- --      Flowsheet Row Video Visit from 08/14/2023 in [facility] Counselor from 05/07/2023 in [facility] Video Visit from 04/21/2023 in [facility]  C-SSRS RISK CATEGORY No Risk No Risk No Risk        Assessment and Plan: EZRI FANGUY is a 57 year old female, on disability, lives in [place], has a history of PTSD, MDD, OCD, multiple medical problems recent COVID-19 infection, ear infection was evaluated by telemedicine today.  Patient is currently stable with regards to her mood although currently struggling with residual symptoms of COVID-19 infection, currently on Augmentin.  Plan as noted below.  Plan PTSD-stable Continue psychotherapy with Ms. Florentina Addison bounds Sertraline 50 mg p.o. daily Lunesta 1 mg p.o. nightly for sleep Reviewed [place] PMP AWARxE  MDD in remission Lithium 600 mg p.o. daily at bedtime Lithium level-0.76-therapeutic.  05/21/2023. BUN/creatinine-within normal limits-05/21/2023. Continue sertraline 50 mg p.o. daily Gabapentin as prescribed for pain although it is also a mood stabilizer.  OCD-improving Klonopin 0.5 mg few times a week only for severe panic attacks Hydroxyzine 25 mg p.o. 3 times daily as needed Sertraline 50 mg p.o. daily Continue CBT Reviewed [place] PMP AWARxE  High risk medication use-will repeat lithium level, BUN/creatinine.  Patient agrees to get it done when she goes for her cardiology labs soon.     Collaboration of Care: Collaboration of Care: Referral or follow-up with counselor/therapist AEB encouraged to continue CBT as well as continue to follow up with primary care provider for current health issues.  Patient/Guardian was advised Release of Information must be obtained prior to any record release in order to  collaborate their care with an outside provider. Patient/Guardian was advised if they have not already done so to contact the registration department to sign all necessary forms in order for [place] to release information regarding their care.   Consent: Patient/Guardian gives verbal consent for treatment and assignment of benefits for services provided during this visit. Patient/Guardian expressed understanding and agreed to proceed.  Follow-up in clinic in 8 to 10 weeks or sooner in person.  This note was generated in part or whole with voice recognition software. Voice recognition is usually quite accurate but there are transcription errors that can and very often do occur. I apologize for any typographical errors that were not detected and corrected.     Jomarie Longs, MD 08/15/2023, 2:17 PM

## 2023-08-17 ENCOUNTER — Other Ambulatory Visit: Payer: Self-pay | Admitting: Family Medicine

## 2023-08-18 NOTE — Telephone Encounter (Signed)
Requested Prescriptions  Pending Prescriptions Disp Refills   famotidine (PEPCID) 20 MG tablet [Pharmacy Med Name: FAMOTIDINE 20 MG TABLET] 90 tablet 1    Sig: TAKE 1 TABLET BY MOUTH AT BEDTIME     Gastroenterology:  H2 Antagonists Passed - 08/17/2023  6:51 AM      Passed - Valid encounter within last 12 months    Recent Outpatient Visits           1 week ago COPD with acute exacerbation [facility])   [facility] Jacky Kindle, FNP   3 weeks ago COVID   [facility] Merita Norton T, FNP   2 months ago PTSD (post-traumatic stress disorder)   [facility] Jacky Kindle, FNP   3 months ago Poison ivy dermatitis   [facility] Merita Norton T, FNP   4 months ago Atopic dermatitis, unspecified type   [facility] Alfredia Ferguson, PA-C       Future Appointments             In 2 months End, Cristal Deer, MD [facility]

## 2023-08-24 ENCOUNTER — Other Ambulatory Visit: Payer: Self-pay | Admitting: Family Medicine

## 2023-08-24 ENCOUNTER — Other Ambulatory Visit: Payer: Self-pay | Admitting: Psychiatry

## 2023-08-24 DIAGNOSIS — F3342 Major depressive disorder, recurrent, in full remission: Secondary | ICD-10-CM

## 2023-08-24 DIAGNOSIS — K2100 Gastro-esophageal reflux disease with esophagitis, without bleeding: Secondary | ICD-10-CM

## 2023-08-24 DIAGNOSIS — F4310 Post-traumatic stress disorder, unspecified: Secondary | ICD-10-CM

## 2023-08-25 NOTE — Telephone Encounter (Signed)
Requested by interface surescripts.  Requested Prescriptions  Pending Prescriptions Disp Refills   omeprazole (PRILOSEC) 40 MG capsule [Pharmacy Med Name: OMEPRAZOLE DR 40 MG CAPSULE] 90 capsule 1    Sig: TAKE 1 CAPSULE BY MOUTH DAILY     Gastroenterology: Proton Pump Inhibitors Passed - 08/24/2023 12:51 PM      Passed - Valid encounter within last 12 months    Recent Outpatient Visits           2 weeks ago COPD with acute exacerbation [facility])   [facility] Jacky Kindle, FNP   4 weeks ago COVID   [facility] Merita Norton T, FNP   2 months ago PTSD (post-traumatic stress disorder)   [facility] Jacky Kindle, FNP   4 months ago Poison ivy dermatitis   [facility] Merita Norton T, FNP   4 months ago Atopic dermatitis, unspecified type   [facility] Alfredia Ferguson, PA-C       Future Appointments             In 1 month End, Cristal Deer, MD [facility]

## 2023-09-08 ENCOUNTER — Ambulatory Visit: Payer: PPO | Admitting: Podiatry

## 2023-09-17 ENCOUNTER — Ambulatory Visit: Payer: PPO | Admitting: Podiatry

## 2023-09-22 ENCOUNTER — Encounter: Payer: Self-pay | Admitting: Internal Medicine

## 2023-09-23 NOTE — Telephone Encounter (Signed)
Blood pressure control actually looks quite good.  If she is still having symptoms, I recommend that Melinda Shaw be scheduled with an APP at her earliest convenience for further evaluation.  [name], MD [facility]

## 2023-09-30 ENCOUNTER — Other Ambulatory Visit: Payer: Self-pay | Admitting: Psychiatry

## 2023-09-30 DIAGNOSIS — F3342 Major depressive disorder, recurrent, in full remission: Secondary | ICD-10-CM

## 2023-09-30 DIAGNOSIS — F4310 Post-traumatic stress disorder, unspecified: Secondary | ICD-10-CM

## 2023-10-01 ENCOUNTER — Other Ambulatory Visit: Payer: Self-pay

## 2023-10-01 ENCOUNTER — Emergency Department
Admission: EM | Admit: 2023-10-01 | Discharge: 2023-10-01 | Disposition: A | Discharge status: Home / Self Care | Payer: PPO | Attending: Emergency Medicine | Admitting: Emergency Medicine

## 2023-10-01 DIAGNOSIS — R6883 Chills (without fever): Secondary | ICD-10-CM | POA: Diagnosis present

## 2023-10-01 DIAGNOSIS — L03116 Cellulitis of left lower limb: Secondary | ICD-10-CM | POA: Diagnosis not present

## 2023-10-01 DIAGNOSIS — L0390 Cellulitis, unspecified: Secondary | ICD-10-CM

## 2023-10-01 LAB — CBC
HCT: 40.1 % (ref 36.0–46.0)
Hemoglobin: 13.5 g/dL (ref 12.0–15.0)
MCH: 31 pg (ref 26.0–34.0)
MCHC: 33.7 g/dL (ref 30.0–36.0)
MCV: 92.2 fL (ref 80.0–100.0)
Platelets: 218 10*3/uL (ref 150–400)
RBC: 4.35 MIL/uL (ref 3.87–5.11)
RDW: 12.8 % (ref 11.5–15.5)
WBC: 6.3 10*3/uL (ref 4.0–10.5)
nRBC: 0 % (ref 0.0–0.2)

## 2023-10-01 LAB — BASIC METABOLIC PANEL
Anion gap: 6 (ref 5–15)
BUN: 10 mg/dL (ref 6–20)
CO2: 21 mmol/L — ABNORMAL LOW (ref 22–32)
Calcium: 9.6 mg/dL (ref 8.9–10.3)
Chloride: 113 mmol/L — ABNORMAL HIGH (ref 98–111)
Creatinine, Ser: 0.91 mg/dL (ref 0.44–1.00)
GFR, Estimated: >60 mL/min (ref >60)
Glucose, Bld: 96 mg/dL (ref 70–99)
Potassium: 4.2 mmol/L (ref 3.5–5.1)
Sodium: 140 mmol/L (ref 135–145)

## 2023-10-01 MED ORDER — LIDOCAINE HCL (PF) 1 % IJ SOLN
5.0000 mL | Freq: Once | INTRAMUSCULAR | Status: AC
  Administered 2023-10-01: 5 mL
  Filled 2023-10-01: qty 5

## 2023-10-01 MED ORDER — CEPHALEXIN 500 MG PO CAPS: 500.0000 mg | ORAL_CAPSULE | Freq: Four times a day (QID) | ORAL | 0 refills | Status: AC

## 2023-10-01 MED ORDER — HYDROCODONE-ACETAMINOPHEN 5-325 MG PO TABS: 1.0000 | ORAL_TABLET | Freq: Four times a day (QID) | ORAL | 0 refills | Status: DC | PRN
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
## 2023-10-01 NOTE — Discharge Instructions (Addendum)
Please seek medical attention for any high fevers, chest pain, shortness of breath, change in behavior, persistent vomiting, bloody stool or any other new or concerning symptoms.  

## 2023-10-02 ENCOUNTER — Telehealth: Payer: Self-pay

## 2023-10-02 ENCOUNTER — Telehealth: Payer: Self-pay | Admitting: Family Medicine

## 2023-10-02 NOTE — Transitions of Care (Post Inpatient/ED Visit) (Signed)
10/02/2023  Name: Melinda Shaw MRN: [id number] DOB: [birth year]  Today's TOC FU Call Status: Today's TOC FU Call Status:: Successful TOC FU Call Completed TOC FU Call Complete Date: 10/02/23 Patient's Name and Date of Birth confirmed.  Transition Care Management Follow-up Telephone Call Date of Discharge: 10/01/23 Discharge Facility: [facility]) Type of Discharge: Emergency Department Reason for ED Visit: Other: (cellulitis) How have you been since you were released from the hospital?: Better Any questions or concerns?: No  Items Reviewed: Did you receive and understand the discharge instructions provided?: Yes Medications obtained,verified, and reconciled?: Yes (Medications Reviewed) Any new allergies since your discharge?: No Dietary orders reviewed?: Yes Do you have support at home?: Yes People in Home: spouse  Medications Reviewed Today: Medications Reviewed Today     Reviewed by Karena Addison, LPN (Licensed Practical Nurse) on 10/02/23 at 1116  Med List Status: <None>   Medication Order Taking? Sig Documenting Provider Last Dose Status Informant  albuterol (VENTOLIN HFA) 108 (90 Base) MCG/ACT inhaler [id number] No Inhale 1-2 puffs into the lungs every 6 (six) hours as needed for wheezing or shortness of breath. Jacky Kindle, FNP Taking Active   amoxicillin-clavulanate (AUGMENTIN) 875-125 MG tablet [id number]  Take 1 tablet by mouth 2 (two) times daily. Jacky Kindle, FNP  Active   aspirin EC 81 MG tablet [id number] No Take 81 mg by mouth daily. [provider] Taking Active   busPIRone (BUSPAR) 10 MG tablet [id number]  Take 1 tablet (10 mg total) by mouth 3 (three) times daily. Jacky Kindle, FNP  Active   cephALEXin (KEFLEX) 500 MG capsule [id number]  Take 1 capsule (500 mg total) by mouth 4 (four) times daily for 10 days. Phineas Semen, MD  Active   cetirizine (ZYRTEC) 10 MG tablet [id number] No Take 1 tablet (10 mg total) by  mouth daily. Jacky Kindle, FNP Taking Active   clonazePAM Scarlette Calico) 0.5 MG tablet [id number]  TAKE 1 TABLET THREE TIMES A WEEK ONLY FOR SEVERE ANXIETY ATTACKS Eappen, Saramma, MD  Active   dicyclomine (BENTYL) 10 MG capsule [id number] No Take 1 capsule (10 mg total) by mouth 3 (three) times daily before meals. Celso Amy, PA-C Taking Active   diltiazem (CARDIZEM CD) 240 MG 24 hr capsule [id number] No Take 1 capsule (240 mg total) by mouth daily. End, Cristal Deer, MD Taking Active   diltiazem (CARDIZEM) 30 MG tablet [id number] No Take 1 tablet (30 mg total) by mouth daily as needed (palpitations). End, Cristal Deer, MD Taking Active   docusate sodium (COLACE) 100 MG capsule [id number] No Take 100 mg by mouth 2 (two) times daily. [provider] Taking Active   EPINEPHrine 0.3 mg/0.3 mL IJ SOAJ injection [id number] No Inject 0.3 mg into the muscle as needed for anaphylaxis. [provider] Taking Active   eszopiclone (LUNESTA) 1 MG TABS tablet [id number]  TAKE 1 TABLET BY MOUTH IMMEDIATELY BEFORE BEDTIME Jomarie Longs, MD  Active   famotidine (PEPCID) 20 MG tablet [id number]  TAKE 1 TABLET BY MOUTH AT BEDTIME Jacky Kindle, FNP  Active   gabapentin (NEURONTIN) 400 MG capsule [id number] No Take 400 mg by mouth 3 (three) times daily. [provider] Taking Active   HYDROcodone-acetaminophen (NORCO/VICODIN) 5-325 MG tablet [id number]  Take 1 tablet by mouth every 6 (six) hours as needed for moderate pain (pain score 4-6). Phineas Semen, MD  Active   hydrOXYzine (VISTARIL) 25 MG capsule [id number] No Take 1 capsule (25 mg total) by mouth  3 (three) times daily as needed for anxiety. And sleep Jacky Kindle, FNP Taking Active   lithium carbonate 300 MG capsule [id number] No Take 600 mg by mouth at bedtime. [provider] Taking Active Self  methylPREDNISolone (MEDROL DOSEPAK) 4 MG TBPK tablet [id number]  Take as directed on package with food/milk in mornings Merita Norton T,  FNP  Active   mometasone-formoterol [facility]) 200-5 MCG/ACT AERO [id number] No Inhale into the lungs. [provider] Taking Active   nitroGLYCERIN (NITROSTAT) 0.4 MG SL tablet [id number] No PLACE ONE TABLET UNDER THE TONGUE EVERY 5 MINUTES AS NEEDED FOR CHEST PAIN. MAXIMUM OF 3 DOSES. End, Cristal Deer, MD Taking Active   nortriptyline (PAMELOR) 10 MG capsule [id number] No Take 10 mg by mouth at bedtime. [provider] Taking Active   omeprazole (PRILOSEC) 40 MG capsule [id number]  TAKE 1 CAPSULE BY MOUTH DAILY Jacky Kindle, FNP  Active   ondansetron (ZOFRAN ODT) 4 MG disintegrating tablet [id number] No Take 1 tablet (4 mg total) by mouth every 8 (eight) hours as needed for nausea or vomiting. Chesley Noon, MD Taking Active   potassium chloride SA (KLOR-CON M20) 20 MEQ tablet [id number] No Take 1 tablet (20 mEq total) by mouth 2 (two) times daily. [name], MD Taking Expired 07/29/23 2359   rosuvastatin (CRESTOR) 10 MG tablet [id number] No Take 1 tablet (10 mg total) by mouth at bedtime. End, Cristal Deer, MD Taking Active   sertraline (ZOLOFT) 50 MG tablet [id number]  TAKE 1 TABLET BY MOUTH DAILY WITH BREAKFAST Jomarie Longs, MD  Active             Home Care and Equipment/Supplies: Were Home Health Services Ordered?: NA Any new equipment or medical supplies ordered?: NA  Functional Questionnaire: Do you need assistance with bathing/showering or dressing?: No Do you need assistance with meal preparation?: No Do you need assistance with eating?: No Do you have difficulty maintaining continence: No Do you need assistance with getting out of bed/getting out of a chair/moving?: No Do you have difficulty managing or taking your medications?: No  Follow up appointments reviewed: PCP Follow-up appointment confirmed?: No (no avail appts. sent message to staff to schedule) Specialist Hospital Follow-up appointment confirmed?: NA Do you need transportation to your  follow-up appointment?: No Do you understand care options if your condition(s) worsen?: Yes-patient verbalized understanding    SIGNATURE Karena Addison, LPN [facility] Nurse Health Advisor Direct Dial [phone]

## 2023-10-02 NOTE — Telephone Encounter (Signed)
Followed up with patient to schedule a hospital follow-up with PCP and patient stated that she wanted to wait to see how the antibiotics did before scheduling an appointment. I told the patient that I hoped she feels better and" we're right here with you" if and when you need [place].

## 2023-10-06 ENCOUNTER — Encounter: Payer: Self-pay | Admitting: Family Medicine

## 2023-10-06 ENCOUNTER — Ambulatory Visit (INDEPENDENT_AMBULATORY_CARE_PROVIDER_SITE_OTHER): Payer: PPO | Admitting: Family Medicine

## 2023-10-06 VITALS — BP 155/77 | HR 60 | Ht 62.0 in | Wt 155.0 lb

## 2023-10-06 DIAGNOSIS — I10 Essential (primary) hypertension: Secondary | ICD-10-CM | POA: Diagnosis not present

## 2023-10-06 DIAGNOSIS — M25562 Pain in left knee: Secondary | ICD-10-CM | POA: Insufficient documentation

## 2023-10-06 MED ORDER — HYDROCODONE-ACETAMINOPHEN 5-325 MG PO TABS: 1.0000 | ORAL_TABLET | Freq: Four times a day (QID) | ORAL | 0 refills | Status: AC | PRN

## 2023-10-06 MED ORDER — DOCUSATE SODIUM 100 MG PO CAPS: 100.0000 mg | ORAL_CAPSULE | Freq: Two times a day (BID) | ORAL | 3 refills | Status: DC | PRN

## 2023-10-06 MED ORDER — LISINOPRIL 10 MG PO TABS: ORAL_TABLET | ORAL | 1 refills | Status: DC

## 2023-10-06 NOTE — Assessment & Plan Note (Signed)
Seen at [facility] and transferred to ED/[facility] on 11/20; reports some improvement since start of ABX. However, L knee continues to provide significant pain and impede ADLs/quality of life.  Request for additional norco refills.  Request for ortho referral.  Site remains benign; no further ecchymosis, very minor edema at site of entry. Pt reports deep pain below site of entry and a 'firmness'  No concern for effusion or accessible site for I/D

## 2023-10-06 NOTE — Assessment & Plan Note (Signed)
Chronic, remains elevated despite previous use of dilt from recs of cards Dr End Recommend slow titration of lisinopril to assist 1 month f/u recommended Goal remains 119/79

## 2023-10-06 NOTE — Progress Notes (Signed)
Established patient visit   Patient: Melinda Shaw   DOB: 1966/07/05   57 y.o. Female  MRN: [id number] Visit Date: 10/06/2023  Today's healthcare provider: Jacky Kindle, FNP  Re Introduced to nurse practitioner role and practice setting.  All questions answered.  Discussed provider/patient relationship and expectations.  Chief Complaint  Patient presents with   Follow-up    Injured left knee -1 month- swollen, stiff,painful especially when bending.   Subjective    HPI HPI     Follow-up    Additional comments: Injured left knee -1 month- swollen, stiff,painful especially when bending.      Last edited by Shelly Bombard, CMA on 10/06/2023  8:12 AM.     Follow up Hospitalization  Patient was admitted to [facility] on 10/01/23. She was treated for L knee cellulitis. Treatment for this included keflex x 10 days. Telephone follow up was done on 10/02/23 She reports fair compliance with treatment. She reports this condition is  continuing to interfere with ADLs, reports redness and initial swelling has improved; however, shooting, sore, tight pains remain with deep knee bends. She has tried an OTC brace and reports it felt like it was causing additional pinching pain. Request for additional treatment and ortho referral. .  ----------------------------------------------------------------------------------------- -  Medications: Outpatient Medications Prior to Visit  Medication Sig   albuterol (VENTOLIN HFA) 108 (90 Base) MCG/ACT inhaler Inhale 1-2 puffs into the lungs every 6 (six) hours as needed for wheezing or shortness of breath.   aspirin EC 81 MG tablet Take 81 mg by mouth daily.   busPIRone (BUSPAR) 10 MG tablet Take 1 tablet (10 mg total) by mouth 3 (three) times daily.   cephALEXin (KEFLEX) 500 MG capsule Take 1 capsule (500 mg total) by mouth 4 (four) times daily for 10 days.   cetirizine (ZYRTEC) 10 MG tablet Take 1 tablet (10 mg total) by mouth daily.   clonazePAM  (KLONOPIN) 0.5 MG tablet TAKE 1 TABLET THREE TIMES A WEEK ONLY FOR SEVERE ANXIETY ATTACKS   dicyclomine (BENTYL) 10 MG capsule Take 1 capsule (10 mg total) by mouth 3 (three) times daily before meals.   diltiazem (CARDIZEM CD) 240 MG 24 hr capsule Take 1 capsule (240 mg total) by mouth daily.   diltiazem (CARDIZEM) 30 MG tablet Take 1 tablet (30 mg total) by mouth daily as needed (palpitations).   EPINEPHrine 0.3 mg/0.3 mL IJ SOAJ injection Inject 0.3 mg into the muscle as needed for anaphylaxis.   eszopiclone (LUNESTA) 1 MG TABS tablet TAKE 1 TABLET BY MOUTH IMMEDIATELY BEFORE BEDTIME   famotidine (PEPCID) 20 MG tablet TAKE 1 TABLET BY MOUTH AT BEDTIME   gabapentin (NEURONTIN) 400 MG capsule Take 400 mg by mouth 3 (three) times daily.   hydrOXYzine (VISTARIL) 25 MG capsule Take 1 capsule (25 mg total) by mouth 3 (three) times daily as needed for anxiety. And sleep   lithium carbonate 300 MG capsule Take 600 mg by mouth at bedtime.   mometasone-formoterol (DULERA) 200-5 MCG/ACT AERO Inhale into the lungs.   nitroGLYCERIN (NITROSTAT) 0.4 MG SL tablet PLACE ONE TABLET UNDER THE TONGUE EVERY 5 MINUTES AS NEEDED FOR CHEST PAIN. MAXIMUM OF 3 DOSES.   nortriptyline (PAMELOR) 10 MG capsule Take 10 mg by mouth at bedtime.   omeprazole (PRILOSEC) 40 MG capsule TAKE 1 CAPSULE BY MOUTH DAILY   ondansetron (ZOFRAN ODT) 4 MG disintegrating tablet Take 1 tablet (4 mg total) by mouth every 8 (eight) hours as  needed for nausea or vomiting.   rosuvastatin (CRESTOR) 10 MG tablet Take 1 tablet (10 mg total) by mouth at bedtime.   sertraline (ZOLOFT) 50 MG tablet TAKE 1 TABLET BY MOUTH DAILY WITH BREAKFAST   [DISCONTINUED] docusate sodium (COLACE) 100 MG capsule Take 100 mg by mouth 2 (two) times daily.   [DISCONTINUED] HYDROcodone-acetaminophen (NORCO/VICODIN) 5-325 MG tablet Take 1 tablet by mouth every 6 (six) hours as needed for moderate pain (pain score 4-6).   potassium chloride SA (KLOR-CON M20) 20 MEQ tablet  Take 1 tablet (20 mEq total) by mouth 2 (two) times daily.   [DISCONTINUED] amoxicillin-clavulanate (AUGMENTIN) 875-125 MG tablet Take 1 tablet by mouth 2 (two) times daily.   [DISCONTINUED] methylPREDNISolone (MEDROL DOSEPAK) 4 MG TBPK tablet Take as directed on package with food/milk in mornings   No facility-administered medications prior to visit.   Last CBC Lab Results  Component Value Date   WBC 6.3 10/01/2023   HGB 13.5 10/01/2023   HCT 40.1 10/01/2023   MCV 92.2 10/01/2023   MCH 31.0 10/01/2023   RDW 12.8 10/01/2023   PLT 218 10/01/2023   Last metabolic panel Lab Results  Component Value Date   GLUCOSE 96 10/01/2023   NA 140 10/01/2023   K 4.2 10/01/2023   CL 113 (H) 10/01/2023   CO2 21 (L) 10/01/2023   BUN 10 10/01/2023   CREATININE 0.91 10/01/2023   GFRNONAA >60 10/01/2023   CALCIUM 9.6 10/01/2023   PROT 7.7 09/05/2022   ALBUMIN 4.8 09/05/2022   LABGLOB 2.9 09/05/2022   AGRATIO 1.7 09/05/2022   BILITOT 0.5 09/05/2022   ALKPHOS 127 (H) 09/05/2022   AST 23 09/05/2022   ALT 26 09/05/2022   ANIONGAP 6 10/01/2023   Last lipids Lab Results  Component Value Date   CHOL 121 04/18/2023   HDL 52 04/18/2023   LDLCALC 53 04/18/2023   TRIG 81 04/18/2023   CHOLHDL 2.3 04/18/2023   Last hemoglobin A1c Lab Results  Component Value Date   HGBA1C 5.6 09/05/2022   Last thyroid functions Lab Results  Component Value Date   TSH 2.212 04/18/2023     Objective    BP (!) 155/77   Pulse 60   Ht 5\' 2"  (1.575 m)   Wt 155 lb (70.3 kg)   LMP 09/24/2016 (Approximate)   SpO2 97%   BMI 28.35 kg/m   BP Readings from Last 3 Encounters:  10/06/23 (!) 155/77  10/01/23 (!) 148/85  08/08/23 139/80   Wt Readings from Last 3 Encounters:  10/06/23 155 lb (70.3 kg)  10/01/23 155 lb (70.3 kg)  08/08/23 152 lb 4.8 oz (69.1 kg)   Physical Exam Vitals and nursing note reviewed.  Constitutional:      General: She is not in acute distress.    Appearance: Normal  appearance. She is overweight. She is not ill-appearing, toxic-appearing or diaphoretic.  HENT:     Head: Normocephalic and atraumatic.  Cardiovascular:     Rate and Rhythm: Normal rate and regular rhythm.     Pulses: Normal pulses.     Heart sounds: Normal heart sounds. No murmur heard.    No friction rub. No gallop.  Pulmonary:     Effort: Pulmonary effort is normal. No respiratory distress.     Breath sounds: Normal breath sounds. No stridor. No wheezing, rhonchi or rales.  Chest:     Chest wall: No tenderness.  Abdominal:     General: Bowel sounds are normal.     Palpations:  Abdomen is soft.  Musculoskeletal:        General: Tenderness present. No swelling, deformity or signs of injury. Normal range of motion.     Right lower leg: No edema.     Left lower leg: No edema.  Skin:    General: Skin is warm and dry.     Capillary Refill: Capillary refill takes less than 2 seconds.     Coloration: Skin is not jaundiced or pale.     Findings: Lesion present. No bruising, erythema or rash.       Neurological:     General: No focal deficit present.     Mental Status: She is alert and oriented to person, place, and time. Mental status is at baseline.     Cranial Nerves: No cranial nerve deficit.     Sensory: No sensory deficit.     Motor: No weakness.     Coordination: Coordination normal.  Psychiatric:        Mood and Affect: Mood normal.        Behavior: Behavior normal.        Thought Content: Thought content normal.        Judgment: Judgment normal.     No results found for any visits on 10/06/23.  Assessment & Plan     Problem List Items Addressed This Visit       Cardiovascular and Mediastinum   Essential hypertension    Chronic, remains elevated despite previous use of dilt from recs of cards Dr End Recommend slow titration of lisinopril to assist 1 month f/u recommended Goal remains 119/79      Relevant Medications   lisinopril (ZESTRIL) 10 MG tablet      Other   Left lateral knee pain - Primary    Seen at [facility] and transferred to ED/[facility] on 11/20; reports some improvement since start of ABX. However, L knee continues to provide significant pain and impede ADLs/quality of life.  Request for additional norco refills.  Request for ortho referral.  Site remains benign; no further ecchymosis, very minor edema at site of entry. Pt reports deep pain below site of entry and a 'firmness'  No concern for effusion or accessible site for I/D      Relevant Medications   HYDROcodone-acetaminophen (NORCO) 5-325 MG tablet   Other Relevant Orders   Ambulatory referral to Orthopedic Surgery   Return in about 4 weeks (around 11/03/2023) for HTN management.     Leilani Merl, FNP, have reviewed all documentation for this visit. The documentation on 10/06/23 for the exam, diagnosis, procedures, and orders are all accurate and complete.  Jacky Kindle, FNP  [facility] [phone] (phone) [phone] (fax)  [facility]

## 2023-10-13 ENCOUNTER — Other Ambulatory Visit
Admission: RE | Admit: 2023-10-13 | Discharge: 2023-10-13 | Disposition: A | Discharge status: Home / Self Care | Payer: PPO | Attending: Psychiatry | Admitting: Psychiatry

## 2023-10-13 ENCOUNTER — Telehealth: Payer: Self-pay | Admitting: Psychiatry

## 2023-10-13 DIAGNOSIS — Z79899 Other long term (current) drug therapy: Secondary | ICD-10-CM | POA: Diagnosis present

## 2023-10-13 LAB — BUN: BUN: 13 mg/dL (ref 6–20)

## 2023-10-13 LAB — CREATININE, SERUM
Creatinine, Ser: 1.03 mg/dL — ABNORMAL HIGH (ref 0.44–1.00)
GFR, Estimated: >60 mL/min (ref >60)

## 2023-10-13 LAB — LITHIUM LEVEL: Lithium Lvl: 0.58 mmol/L — ABNORMAL LOW (ref 0.60–1.20)

## 2023-10-13 NOTE — Telephone Encounter (Signed)
Contacted patient to discuss creatinine level elevated-Labs dated 10/13/2023-1.03. Lithium level subtherapeutic at 0.58.  Patient reports she recently was treated for cellulitis and was on multiple antibiotics which may have something to do with her elevated creatinine level.  She however will discuss with primary care provider for further management.  Patient reports she was recently initiated on lisinopril which is currently managing her blood pressure.  Discussed with patient drug-drug interaction between lisinopril and lithium.  If she were to stay on the lisinopril we will have to start tapering off the lithium since lisinopril can elevate steady state level of lithium and could cause lithium toxicity.  Patient agrees to discuss with primary care provider.

## 2023-10-14 ENCOUNTER — Other Ambulatory Visit: Payer: Self-pay | Admitting: Family Medicine

## 2023-10-14 ENCOUNTER — Ambulatory Visit: Payer: Self-pay

## 2023-10-14 DIAGNOSIS — Z79899 Other long term (current) drug therapy: Secondary | ICD-10-CM

## 2023-10-14 NOTE — Telephone Encounter (Signed)
Chief Complaint: Medication Question   Disposition: [] ED /[] Urgent Care (no appt availability in office) / [] Appointment(In office/virtual)/ []  [place] [facility]/ [] Home Care/ [] Refused Recommended Disposition /[] [place] [facility]/ []  Follow-up with PCP Additional Notes: Patient states she spoke with [name] last night about her lab result and [name] advised patient to stop taking the Lisinopril medication due to the drug interaction it can have with Lithium and the it can cause toxicity with kidney damage. Patient states she was advised to call PCP to discuss alternative medications choices. Patient states the Lisinopril has been helpful with her blood pressure and she does not want to stop taking it if she does not have to, she would prefer to be off of the Lithium. Patient is requesting PCP review notes from [name] and give a recommendation for the medication. Care advice was given and patient was advised not to take the Lisinopril until she hears back from PCP with recommendations. Patient verbalized understanding. Patient prefers a phone call instead of a MyChart message.   Summary: Medication Clairty/clinical advice   Patient has called and needs some medication clarity/guidance  Patient states she had a lab draw and got her results back from her Physiatric doctor yesterday and she said they told her that her creatinine ser level was high and her kidney function was 103.  Patient states she is on lithium and Merita Norton just put her on Lisinopril last week.  Patient states they told her not to take (the Physiatric doctor) Lisinopril because it could cause toxicitiy in blood stream when taking lithium.  Patient states the Lisinopril is helping but she wants to know should she take that medication this morning or skip the dose or what to do???  Patient states she is concerned. Patient states in recent blood draws this did not come up. Patient states she has been on high  dose antibiotics. Patient is not having any symptoms.  Patients callback # [phone]     Reason for Disposition  [1] Caller has NON-URGENT medicine question about med that PCP prescribed AND [2] triager unable to answer question  Answer Assessment - Initial Assessment Questions 1. NAME of MEDICINE: "What medicine(s) are you calling about?"     Lisinopril  2. QUESTION: "What is your question?" (e.g., double dose of medicine, side effect)     I was advised to stop taking Lisinopril due to the toxicity that could occur while taking Lithium  3. PRESCRIBER: "Who prescribed the medicine?" Reason: if prescribed by specialist, call should be referred to that group.     Robynn Pane, FNP 4. SYMPTOMS: "Do you have any symptoms?" If Yes, ask: "What symptoms are you having?"  "How bad are the symptoms (e.g., mild, moderate, severe)     None  Protocols used: Medication Question Call-A-AH

## 2023-10-15 ENCOUNTER — Encounter: Payer: Self-pay | Admitting: Family Medicine

## 2023-10-17 ENCOUNTER — Encounter: Payer: Self-pay | Admitting: Family Medicine

## 2023-10-17 ENCOUNTER — Other Ambulatory Visit: Payer: Self-pay | Admitting: Family Medicine

## 2023-10-17 ENCOUNTER — Telehealth: Payer: Self-pay | Admitting: Family Medicine

## 2023-10-17 NOTE — Telephone Encounter (Signed)
Patient called stated she is in between physicians that is giving her medication that's interring with each other and she does not know what to do. Lisinopril is working but Dr Cheral Marker told her that it is dangerous to her. Please f/u with patient as she is needing answers.

## 2023-10-21 ENCOUNTER — Ambulatory Visit: Payer: PPO

## 2023-10-21 DIAGNOSIS — Z0000 Encounter for general adult medical examination without abnormal findings: Secondary | ICD-10-CM | POA: Diagnosis not present

## 2023-10-21 NOTE — Patient Instructions (Addendum)
Ms. Nakao , Thank you for taking time to come for your Medicare Wellness Visit. I appreciate your ongoing commitment to your health goals. Please review the following plan we discussed and let me know if I can assist you in the future.   Referrals/Orders/Follow-Ups/Clinician Recommendations: NONE  This is a list of the screening recommended for you and due dates:  Health Maintenance  Topic Date Due   Pneumococcal Vaccination (1 of 2 - PCV) Never done   DTaP/Tdap/Td vaccine (1 - Tdap) Never done   Zoster (Shingles) Vaccine (1 of 2) Never done   Pap with HPV screening  01/09/2020   COVID-19 Vaccine (4 - 2023-24 season) 07/13/2023   Flu Shot  02/09/2024*   Mammogram  03/20/2024   Medicare Annual Wellness Visit  10/20/2024   Colon Cancer Screening  03/17/2031   Hepatitis C Screening  Completed   HIV Screening  Completed   HPV Vaccine  Aged Out  *Topic was postponed. The date shown is not the original due date.    Advanced directives: (ACP Link)Information on Advanced Care Planning can be found at [facility] Advance Health Care Directives Advance Health Care Directives ([email or web address]   Next Medicare Annual Wellness Visit scheduled for next year: Yes   10/26/24 @ 10:50 AM BY VIDEO

## 2023-10-21 NOTE — Progress Notes (Signed)
Subjective:   Melinda Shaw is a 57 y.o. female who presents for Medicare Annual (Subsequent) preventive examination.  Visit Complete: Virtual I connected with  Melissie Keatley Siravo on 10/21/23 by a audio enabled telemedicine application and verified that I am speaking with the correct person using two identifiers.  Patient Location: Home  Provider Location: Office/Clinic  I discussed the limitations of evaluation and management by telemedicine. The patient expressed understanding and agreed to proceed.  Vital Signs: Because this visit was a virtual/telehealth visit, some criteria may be missing or patient reported. Any vitals not documented were not able to be obtained and vitals that have been documented are patient reported.  Cardiac Risk Factors include: advanced age (>22men, >8 women);hypertension     Objective:    Today's Vitals   10/21/23 1115  PainSc: 4    There is no height or weight on file to calculate BMI.     10/21/2023   11:23 AM 10/01/2023   12:07 PM 10/17/2022   11:20 AM 01/31/2022    9:50 AM 05/21/2021    3:31 PM 06/06/2020    5:39 PM 09/22/2018    6:41 AM  Advanced Directives  Does Patient Have a Medical Advance Directive? No No No No No No   Would patient like information on creating a medical advance directive? No - Patient declined  No - Patient declined  No - Patient declined No - Patient declined      Information is confidential and restricted. Go to Review Flowsheets to unlock data.    Current Medications (verified) Outpatient Encounter Medications as of 10/21/2023  Medication Sig   albuterol (VENTOLIN HFA) 108 (90 Base) MCG/ACT inhaler Inhale 1-2 puffs into the lungs every 6 (six) hours as needed for wheezing or shortness of breath.   aspirin EC 81 MG tablet Take 81 mg by mouth daily.   cetirizine (ZYRTEC) 10 MG tablet Take 1 tablet (10 mg total) by mouth daily.   clonazePAM (KLONOPIN) 0.5 MG tablet TAKE 1 TABLET THREE TIMES A WEEK ONLY FOR  SEVERE ANXIETY ATTACKS   diltiazem (CARDIZEM CD) 240 MG 24 hr capsule Take 1 capsule (240 mg total) by mouth daily.   diltiazem (CARDIZEM) 30 MG tablet Take 1 tablet (30 mg total) by mouth daily as needed (palpitations).   docusate sodium (COLACE) 100 MG capsule Take 1 capsule (100 mg total) by mouth 2 (two) times daily as needed for mild constipation.   EPINEPHrine 0.3 mg/0.3 mL IJ SOAJ injection Inject 0.3 mg into the muscle as needed for anaphylaxis.   eszopiclone (LUNESTA) 1 MG TABS tablet TAKE 1 TABLET BY MOUTH IMMEDIATELY BEFORE BEDTIME   famotidine (PEPCID) 20 MG tablet TAKE 1 TABLET BY MOUTH AT BEDTIME   gabapentin (NEURONTIN) 400 MG capsule Take 400 mg by mouth 3 (three) times daily.   hydrOXYzine (VISTARIL) 25 MG capsule Take 1 capsule (25 mg total) by mouth 3 (three) times daily as needed for anxiety. And sleep   lithium carbonate 300 MG capsule Take 600 mg by mouth at bedtime.   mometasone-formoterol (DULERA) 200-5 MCG/ACT AERO Inhale into the lungs.   nitroGLYCERIN (NITROSTAT) 0.4 MG SL tablet PLACE ONE TABLET UNDER THE TONGUE EVERY 5 MINUTES AS NEEDED FOR CHEST PAIN. MAXIMUM OF 3 DOSES.   nortriptyline (PAMELOR) 10 MG capsule Take 10 mg by mouth at bedtime.   omeprazole (PRILOSEC) 40 MG capsule TAKE 1 CAPSULE BY MOUTH DAILY   ondansetron (ZOFRAN ODT) 4 MG disintegrating tablet Take 1 tablet (4  mg total) by mouth every 8 (eight) hours as needed for nausea or vomiting.   rosuvastatin (CRESTOR) 10 MG tablet Take 1 tablet (10 mg total) by mouth at bedtime.   sertraline (ZOLOFT) 50 MG tablet TAKE 1 TABLET BY MOUTH DAILY WITH BREAKFAST   busPIRone (BUSPAR) 10 MG tablet Take 1 tablet (10 mg total) by mouth 3 (three) times daily. (Patient not taking: Reported on 10/21/2023)   dicyclomine (BENTYL) 10 MG capsule Take 1 capsule (10 mg total) by mouth 3 (three) times daily before meals.   lisinopril (ZESTRIL) 10 MG tablet Use of 1/2 tablet (5 mg) x 2 weeks; if BP remains >135/or >85 increase to  10 mg (1 tablet) daily. (Patient not taking: Reported on 10/21/2023)   potassium chloride SA (KLOR-CON M20) 20 MEQ tablet Take 1 tablet (20 mEq total) by mouth 2 (two) times daily.   No facility-administered encounter medications on file as of 10/21/2023.    Allergies (verified) Cherry, Other, Shellfish allergy, Sulfa antibiotics, Bee venom, and Imdur [isosorbide nitrate]   History: Past Medical History:  Diagnosis Date   Anemia    H/O   Anxiety    Bronchitis    Chest pain    a. 09/2018 Cath: Nl cors. EF 65%->Med rx (long-acting nitrate added).   Depression    Diastolic dysfunction    a. 10/2018 Echo: EF 55-60%, no rwma, Gr1DD. Mild MR. Nl RV fxn.   DOE (dyspnea on exertion)    Fibromyalgia    GERD (gastroesophageal reflux disease)    Headache    Hyperlipidemia    Hypertension    no meds   Palpitations    a. 01/2019 [facility]): NSR w/ rare PACs & PVCs; b. 04/2020 [facility]: Predominantly sinus rhythm @ 75 (48-141). Rare PACs/PVCs. No significant arrhythmias or prolonged pauses.  No triggered events.   PONV (postoperative nausea and vomiting)    Past Surgical History:  Procedure Laterality Date   ANTERIOR CRUCIATE LIGAMENT REPAIR     CARDIAC CATHETERIZATION     CHOLECYSTECTOMY     CORONARY ANGIOPLASTY     ESOPHAGOGASTRODUODENOSCOPY (EGD) WITH PROPOFOL N/A 08/28/2015   Procedure: ESOPHAGOGASTRODUODENOSCOPY (EGD) WITH PROPOFOL;  Surgeon: Elnita Maxwell, MD;  Location: [facility] ENDOSCOPY;  Service: Endoscopy;  Laterality: N/A;   ESOPHAGOGASTRODUODENOSCOPY (EGD) WITH PROPOFOL N/A 01/31/2022   Procedure: ESOPHAGOGASTRODUODENOSCOPY (EGD) WITH PROPOFOL;  Surgeon: Wyline Mood, MD;  Location: [facility] ENDOSCOPY;  Service: Gastroenterology;  Laterality: N/A;   KNEE SURGERY     left knee    LEFT HEART CATH AND CORONARY ANGIOGRAPHY Left 09/22/2018   Procedure: LEFT HEART CATH AND CORONARY ANGIOGRAPHY;  Surgeon: [name], MD;  Location: [facility] INVASIVE CV LAB;  Service: Cardiovascular;   Laterality: Left;   MASS EXCISION Left 01/26/2016   Procedure: EXCISION OF LEFT WRIST VOLAR MASS;  Surgeon: Dairl Ponder, MD;  Location: [facility];  Service: Orthopedics;  Laterality: Left;   NOSE SURGERY     POLYPECTOMY     TUBAL LIGATION     VIDEO BRONCHOSCOPY N/A 05/29/2015   Procedure: VIDEO BRONCHOSCOPY WITHOUT FLUORO;  Surgeon: Stephanie Acre, MD;  Location: [facility] ORS;  Service: Cardiopulmonary;  Laterality: N/A;   Family History  Problem Relation Age of Onset   Dementia Mother    Alcohol abuse Mother    COPD Mother    High Cholesterol Mother    Heart failure Mother    Hypertension Mother    Dementia Father    Alcohol abuse Father    Hypertension Father  AAA (abdominal aortic aneurysm) Sister    COPD Sister    High Cholesterol Sister    Social History   Socioeconomic History   Marital status: Married    Spouse name: david   Number of children: 3   Years of education: Not on file   Highest education level: 10th grade  Occupational History   Not on file  Tobacco Use   Smoking status: Never   Smokeless tobacco: Never  Vaping Use   Vaping status: Never Used  Substance and Sexual Activity   Alcohol use: No    Alcohol/week: 0.0 standard drinks of alcohol   Drug use: No   Sexual activity: Yes  Other Topics Concern   Not on file  Social History Narrative   Not on file   Social Determinants of Health   Financial Resource Strain: Low Risk  (10/21/2023)   Overall Financial Resource Strain (CARDIA)    Difficulty of Paying Living Expenses: Not very hard  Recent Concern: Financial Resource Strain - Medium Risk (10/13/2023)   Received from [facility]   Overall Financial Resource Strain (CARDIA)    Difficulty of Paying Living Expenses: Somewhat hard  Food Insecurity: No Food Insecurity (10/21/2023)   Hunger Vital Sign    Worried About Running Out of Food in the Last Year: Never true    Ran Out of Food in the Last Year: Never true   Recent Concern: Food Insecurity - Food Insecurity Present (10/13/2023)   Received from [facility]   Hunger Vital Sign    Worried About Running Out of Food in the Last Year: Sometimes true    Ran Out of Food in the Last Year: Never true  Transportation Needs: No Transportation Needs (10/21/2023)   PRAPARE - Administrator, Civil Service (Medical): No    Lack of Transportation (Non-Medical): No  Recent Concern: Transportation Needs - Unmet Transportation Needs (10/13/2023)   Received from [facility] - Transportation    In the past 12 months, has lack of transportation kept you from medical appointments or from getting medications?: Yes    Lack of Transportation (Non-Medical): Yes  Physical Activity: Sufficiently Active (10/21/2023)   Exercise Vital Sign    Days of Exercise per Week: 7 days    Minutes of Exercise per Session: 60 min  Stress: No Stress Concern Present (10/21/2023)   [employer] of Occupational Health - Occupational Stress Questionnaire    Feeling of Stress : Only a little  Recent Concern: Stress - Stress Concern Present (10/05/2023)   [employer] of Occupational Health - Occupational Stress Questionnaire    Feeling of Stress : To some extent  Social Connections: Socially Isolated (10/21/2023)   Social Connection and Isolation Panel [NHANES]    Frequency of Communication with Friends and Family: Once a week    Frequency of Social Gatherings with Friends and Family: Never    Attends Religious Services: Never    Diplomatic Services operational officer: No    Attends Engineer, structural: Never    Marital Status: Married    Tobacco Counseling Counseling given: Not Answered   Clinical Intake:  Pre-visit preparation completed: Yes  Pain : 0-10 Pain Score: 4  Pain Type: Chronic pain Pain Location: Leg Pain Orientation: Left Pain Descriptors / Indicators: Aching, Pounding Pain Onset:  More than a month ago Pain Frequency: Constant     BMI - recorded: 28.3 Nutritional Status: BMI 25 -  29 Overweight Nutritional Risks: None Diabetes: No  How often do you need to have someone help you when you read instructions, pamphlets, or other written materials from your doctor or pharmacy?: 1 - Never  Interpreter Needed?: No  Information entered by :: Kennedy Bucker, LPN   Activities of Daily Living    10/21/2023   11:24 AM 10/17/2023    9:01 AM  In your present state of health, do you have any difficulty performing the following activities:  Hearing? 0 0  Vision? 0 0  Difficulty concentrating or making decisions? 0 0  Walking or climbing stairs? 0 0  Dressing or bathing? 0 0  Doing errands, shopping? 0 0  Preparing Food and eating ? N N  Using the Toilet? N N  In the past six months, have you accidently leaked urine? N N  Do you have problems with loss of bowel control? N N  Managing your Medications? N N  Managing your Finances? N   Housekeeping or managing your Housekeeping? N N    Patient Care Team: Jacky Kindle, FNP as PCP - General (Family Medicine) End, Cristal Deer, MD as PCP - Cardiology (Cardiology) Jim Like, RN as Registered Nurse Scarlett Presto, RN (Inactive) as Registered Nurse Pa, [facility] (Optometry)  Indicate any recent Medical Services you may have received from other than [facility] providers in the past year (date may be approximate).     Assessment:   This is a routine wellness examination for Nelli.  Hearing/Vision screen Hearing Screening - Comments:: NO AIDS Vision Screening - Comments:: WEARS GLASSES- [facility]   Goals Addressed             This Visit's Progress    DIET - INCREASE WATER INTAKE         Depression Screen    10/21/2023   11:20 AM 06/13/2023    2:10 PM 05/07/2023    1:01 PM 04/21/2023    4:10 PM 04/04/2023    2:23 PM 02/18/2023   10:42 AM 02/12/2023    3:15 PM  PHQ 2/9 Scores  PHQ - 2 Score 1  1  0 0    PHQ- 9 Score 3 7  0        Information is confidential and restricted. Go to Review Flowsheets to unlock data.    Fall Risk    10/21/2023   11:23 AM 10/17/2023    9:01 AM 06/13/2023    2:10 PM 04/21/2023    4:10 PM 12/10/2022    2:18 PM  Fall Risk   Falls in the past year? 0 0 0 0 1  Number falls in past yr: 0 0  0 0  Injury with Fall? 0 0 0 0 0  Risk for fall due to : No Fall Risks  No Fall Risks    Follow up Falls prevention discussed;Falls evaluation completed  Falls evaluation completed      MEDICARE RISK AT HOME: Medicare Risk at Home Any stairs in or around the home?: Yes If so, are there any without handrails?: No Home free of loose throw rugs in walkways, pet beds, electrical cords, etc?: Yes Adequate lighting in your home to reduce risk of falls?: Yes Life alert?: No Use of a cane, walker or w/c?: No Grab bars in the bathroom?: No Shower chair or bench in shower?: No Elevated toilet seat or a handicapped toilet?: No  TIMED UP AND GO:  Was the test performed?  No  Cognitive Function:        10/21/2023   11:25 AM 10/17/2022   11:24 AM  6CIT Screen  What Year? 0 points 0 points  What month? 0 points 0 points  What time? 0 points 0 points  Count back from 20 0 points 0 points  Months in reverse 0 points 0 points  Repeat phrase 2 points 0 points  Total Score 2 points 0 points    Immunizations Immunization History  Administered Date(s) Administered   Hepatitis A, Ped/Adol-2 Dose 03/02/2021   Hepatitis B, PED/ADOLESCENT 03/02/2021   Influenza Whole 06/11/2021   Influenza,inj,Quad PF,6+ Mos 09/05/2022   Moderna Sars-Covid-2 Vaccination 07/19/2020, 08/16/2020   Pfizer Covid-19 Vaccine Bivalent Booster 65yrs & up 03/02/2021    TDAP status: Due, Education has been provided regarding the importance of this vaccine. Advised may receive this vaccine at local pharmacy or Health Dept. Aware to provide a copy of the vaccination record if obtained from  local pharmacy or Health Dept. Verbalized acceptance and understanding.  Flu Vaccine status: Up to date, CAN'T REMEMBER DATE FOR THIS YEAR'S  Pneumococcal vaccine status: Declined,  Education has been provided regarding the importance of this vaccine but patient still declined. Advised may receive this vaccine at local pharmacy or Health Dept. Aware to provide a copy of the vaccination record if obtained from local pharmacy or Health Dept. Verbalized acceptance and understanding.   Covid-19 vaccine status: Completed vaccines  Qualifies for Shingles Vaccine? Yes   Zostavax completed No   Shingrix Completed?: No.    Education has been provided regarding the importance of this vaccine. Patient has been advised to call insurance company to determine out of pocket expense if they have not yet received this vaccine. Advised may also receive vaccine at local pharmacy or Health Dept. Verbalized acceptance and understanding.  Screening Tests Health Maintenance  Topic Date Due   Pneumococcal Vaccine 49-58 Years old (1 of 2 - PCV) Never done   DTaP/Tdap/Td (1 - Tdap) Never done   Zoster Vaccines- Shingrix (1 of 2) Never done   Cervical Cancer Screening (HPV/Pap Cotest)  01/09/2020   COVID-19 Vaccine (4 - 2023-24 season) 07/13/2023   INFLUENZA VACCINE  02/09/2024 (Originally 06/12/2023)   MAMMOGRAM  03/20/2024   Medicare Annual Wellness (AWV)  10/20/2024   Colonoscopy  03/17/2031   Hepatitis C Screening  Completed   HIV Screening  Completed   HPV VACCINES  Aged Out    Health Maintenance  Health Maintenance Due  Topic Date Due   Pneumococcal Vaccine 67-23 Years old (1 of 2 - PCV) Never done   DTaP/Tdap/Td (1 - Tdap) Never done   Zoster Vaccines- Shingrix (1 of 2) Never done   Cervical Cancer Screening (HPV/Pap Cotest)  01/09/2020   COVID-19 Vaccine (4 - 2023-24 season) 07/13/2023    Colorectal cancer screening: Type of screening: Colonoscopy. Completed 03/16/21. Repeat every 2 years D/T  CANCER- APPT COMING UP IN FEBRUARY 2025  Mammogram status: Completed 03/21/23. Repeat every year   Lung Cancer Screening: (Low Dose CT Chest recommended if Age 36-80 years, 20 pack-year currently smoking OR have quit w/in 15years.) does not qualify.    Additional Screening:  Hepatitis C Screening: does qualify; Completed 12/19/21  Vision Screening: Recommended annual ophthalmology exams for early detection of glaucoma and other disorders of the eye. Is the patient up to date with their annual eye exam?  Yes  Who is the provider or what is the name of the office in which the  patient attends annual eye exams? [facility] If pt is not established with a provider, would they like to be referred to a provider to establish care? No .   Dental Screening: Recommended annual dental exams for proper oral hygiene  Community Resource Referral / Chronic Care Management: CRR required this visit?  No   CCM required this visit?  No     Plan:     I have personally reviewed and noted the following in the patient's chart:   Medical and social history Use of alcohol, tobacco or illicit drugs  Current medications and supplements including opioid prescriptions. Patient is not currently taking opioid prescriptions. Functional ability and status Nutritional status Physical activity Advanced directives List of other physicians Hospitalizations, surgeries, and ER visits in previous 12 months Vitals Screenings to include cognitive, depression, and falls Referrals and appointments  In addition, I have reviewed and discussed with patient certain preventive protocols, quality metrics, and best practice recommendations. A written personalized care plan for preventive services as well as general preventive health recommendations were provided to patient.     Hal Hope, LPN   16/08/9603   After Visit Summary: (MyChart) Due to this being a telephonic visit, the after visit summary with patients  personalized plan was offered to patient via MyChart   Nurse Notes: NONE

## 2023-10-23 ENCOUNTER — Ambulatory Visit: Payer: PPO | Attending: Internal Medicine | Admitting: Internal Medicine

## 2023-10-23 ENCOUNTER — Encounter: Payer: Self-pay | Admitting: Internal Medicine

## 2023-10-23 VITALS — BP 156/86 | HR 55 | Ht 62.0 in | Wt 155.5 lb

## 2023-10-23 DIAGNOSIS — I10 Essential (primary) hypertension: Secondary | ICD-10-CM

## 2023-10-23 DIAGNOSIS — R002 Palpitations: Secondary | ICD-10-CM | POA: Diagnosis not present

## 2023-10-23 DIAGNOSIS — R0789 Other chest pain: Secondary | ICD-10-CM

## 2023-10-23 MED ORDER — LISINOPRIL 10 MG PO TABS: 10.0000 mg | ORAL_TABLET | Freq: Every day | ORAL | 3 refills | Status: DC

## 2023-10-23 NOTE — Progress Notes (Signed)
Cardiology Office Note:  .   Date:  10/23/2023  ID:  Melinda Shaw, DOB 31-Oct-1966, MRN [id number] PCP: Melinda Kindle, FNP  [facility] Providers Cardiologist:  Melinda Kendall, MD     History of Present Illness: .   Melinda Shaw is a 57 y.o. female with history of chest pain with normal coronary arteries by coronary CTA, dyspnea on exertion, palpitations, hypertension, hyperlipidemia, fibromyalgia, bronchitis, and GERD, who presents for follow-up of palpitations.  I last saw her in June, which time she reported increased awareness of palpitations over the last few weeks.  We did not make any medication changes, though potassium was noted to be slightly low.  Potassium supplementation was initiated.  Due to continued palpitations, we agreed to perform an event monitor this summer, which showed rare PACs and PVCs as well as 2 brief supraventricular runs; no significant arrhythmia was identified to explain the patient's symptoms).  She reached out to [place] last month with concerns about blood pressure fluctuations and concerns that she was having atrial fibrillation/flutter (monitor in 05/2023 showed no evidence of a-fib/flutter).  Today, Melinda Shaw is upset about recent elevated blood pressures and medication changes.  She notes that this began after she received puncture wounds from a [place] that she keeps.  She was bordering on sepsis and was on "high-powered antibiotics."  In the setting of this, her blood pressures were elevated, prompting Melinda Shaw to add lisinopril for blood pressure control.  Melinda Shaw subsequently saw her mental health provider, Melinda Shaw, who noted negligible increase in creatinine (0.91->1.03) and recommended cessation of lisinopril in the setting of long-term lithium use.  He otherwise suggested weaning off of lithium.  Melinda Shaw feels like instructions regarding her blood pressure management were confusing.  She has also wished to come off lithium for quite some  time and plans to address this again with Melinda Shaw at their upcoming visit.  She notes that her blood pressures have still been elevated at home.  She occasionally has mild tightness in the chest that she attributes to stress.  It is similar to prior visits.  She denies shortness of breath, palpitations, and lightheadedness.  She has some mild edema near the puncture sites from her recent [place] injury, though her legs have otherwise not been swollen.  She notes that while she was on lisinopril for about a week, her blood pressure was very well-controlled and that she tolerated the medication without adverse effects.  ROS: See HPI  Studies Reviewed: [name]   EKG Interpretation Date/Time:  Thursday October 23 2023 10:15:59 EST Ventricular Rate:  55 PR Interval:  152 QRS Duration:  82 QT Interval:  416 QTC Calculation: 397 R Axis:   70  Text Interpretation: Sinus bradycardia Normal ECG When compared with ECG of 18-Apr-2023 No significant change was found Confirmed by Saretta Dahlem [phone]) on 10/23/2023 7:31:55 PM    14-day event monitor (05/22/2023): Predominantly sinus rhythm with rare PACs and PVCs as well as 2 brief supraventricular runs lasting up to 5 beats.  No significant arrhythmia identified to explain the patient's symptoms.  Risk Assessment/Calculations:     HYPERTENSION CONTROL Vitals:   10/23/23 1005 10/23/23 1045  BP: (!) 140/82 (!) 156/86    The patient's blood pressure is elevated above target today.  In order to address the patient's elevated BP: A new medication was prescribed today.          Physical Exam:   VS:  BP (!) 156/86 (  BP Location: Left Arm, Patient Position: Sitting, Cuff Size: Normal)   Pulse (!) 55   Ht 5\' 2"  (1.575 m)   Wt 155 lb 8 oz (70.5 kg)   LMP 09/24/2016 (Approximate)   SpO2 98%   BMI 28.44 kg/m    Wt Readings from Last 3 Encounters:  10/23/23 155 lb 8 oz (70.5 kg)  10/06/23 155 lb (70.3 kg)  10/01/23 155 lb (70.3 kg)    General:   NAD. Neck: No JVD or HJR. Lungs: Clear to auscultation bilaterally without wheezes or crackles. Heart: Cardiac but regular without murmurs, rubs, or gallops. Abdomen: Soft, nontender, nondistended. Extremities: No lower extremity edema.  ASSESSMENT AND PLAN: .    Hypertension: Blood pressure has been elevated for at least several weeks despite continuation of diltiazem.  Lisinopril was recently started by Melinda Shaw with significant improvement in blood pressure, though this was subsequently stopped due to concerns regarding kidney effects and potential development of lithium toxicity.  We discussed alternative agents to help improve Melinda Shaw's blood pressure, including amlodipine (not ideal since the patient is already on a calcium channel blocker - diltiazem) and hydralazine (would necessitate 3 times daily dosing).  Melinda Shaw feels strongly about going back on lisinopril, as she tolerated this well and prefers once daily dosing.  Additionally, she has wanted to come off lithium for some time and plans to stop this medication in the future.  She has already cut down on her own from 600 mg daily to 300 mg daily.  We will therefore resume lisinopril 10 mg daily with plans to repeat a BMP in 2 weeks.  I will reach out to Melinda Shaw to let her know what our plan is so that she can help facilitate discontinuation of lithium and initiation of an alternative therapy.  Chest pain and palpitations: Symptoms remain atypical, with prior workup being unrevealing.  No further workup at this time.  Continue diltiazem for palpitations.    Dispo: Return to clinic in 1 month for blood pressure check with APP.  Signed, Melinda Kendall, MD

## 2023-10-23 NOTE — Telephone Encounter (Signed)
Pt had appt with Dr. Okey Dupre Cardiology today. Lisinopril was also sent by Dr. Okey Dupre. No further assistance needed from NT.   Summary: Medication   lisinopril (ZESTRIL) 10 MG tablet has been taken over by her cardiologist now. Dr End.  Pt cancelled her appt because her cardiologist is taking over the handling of this medicine for future reference.       This encounter was created in error - please disregard.

## 2023-10-23 NOTE — Patient Instructions (Signed)
Medication Instructions:  Your physician recommends the following medication changes.    START TAKING: Lisinopril 10 mg by mouth daily  Dr. Okey Dupre recommends speaking with your physiatrics regarding weaning off your lithium    *If you need a refill on your cardiac medications before your next appointment, please call your pharmacy*   Lab Work: Your provider would like for you to return in 2 weeks to have the following labs drawn: (BMP).   Please go to [facility] [address] ([facility] [address], [place] [address] You do not need an appointment.  They are open from 7:30 am-4 pm.  Lunch from 1:00 pm- 2:00 pm You will not need to be fasting.   Testing/Procedures: No test ordered today    Follow-Up: At [facility], you and your health needs are our priority.  As part of our continuing mission to provide you with exceptional heart care, we have created designated Provider Care Teams.  These Care Teams include your primary Cardiologist (physician) and Advanced Practice Providers (APPs -  Physician Assistants and Nurse Practitioners) who all work together to provide you with the care you need, when you need it.  We recommend signing up for the patient portal called "MyChart".  Sign up information is provided on this After Visit Summary.  MyChart is used to connect with patients for Virtual Visits (Telemedicine).  Patients are able to view lab/test results, encounter notes, upcoming appointments, etc.  Non-urgent messages can be sent to your provider as well.   To learn more about what you can do with MyChart, go to [email or web address].    Your next appointment:   1 month(s)  Provider:   You will see one of the following Advanced Practice Providers on your designated Care Team:   Nicolasa Ducking, NP Eula Listen, PA-C Cadence Fransico Michael, PA-C Charlsie Quest, NP Carlos Levering, NP

## 2023-10-24 ENCOUNTER — Encounter: Payer: Self-pay | Admitting: Psychiatry

## 2023-10-24 ENCOUNTER — Telehealth (INDEPENDENT_AMBULATORY_CARE_PROVIDER_SITE_OTHER): Payer: PPO | Admitting: Psychiatry

## 2023-10-24 ENCOUNTER — Telehealth: Payer: Self-pay

## 2023-10-24 DIAGNOSIS — Z5181 Encounter for therapeutic drug level monitoring: Secondary | ICD-10-CM | POA: Diagnosis not present

## 2023-10-24 DIAGNOSIS — F4310 Post-traumatic stress disorder, unspecified: Secondary | ICD-10-CM

## 2023-10-24 DIAGNOSIS — Z79899 Other long term (current) drug therapy: Secondary | ICD-10-CM | POA: Diagnosis not present

## 2023-10-24 DIAGNOSIS — F3342 Major depressive disorder, recurrent, in full remission: Secondary | ICD-10-CM

## 2023-10-24 DIAGNOSIS — F429 Obsessive-compulsive disorder, unspecified: Secondary | ICD-10-CM

## 2023-10-24 MED ORDER — TOPIRAMATE 50 MG PO TABS: 75.0000 mg | ORAL_TABLET | Freq: Every day | ORAL | 1 refills | Status: DC

## 2023-10-24 MED ORDER — LITHIUM CARBONATE 150 MG PO CAPS: 150.0000 mg | ORAL_CAPSULE | Freq: Every day | ORAL | 0 refills | Status: DC

## 2023-10-24 MED ORDER — LITHIUM CARBONATE 300 MG PO CAPS: 300.0000 mg | ORAL_CAPSULE | Freq: Every day | ORAL | 0 refills | Status: DC

## 2023-10-24 NOTE — Telephone Encounter (Signed)
Copied from CRM [id number]. Topic: General - Other >> Oct 23, 2023  4:33 PM Priscille Loveless wrote: Reason for CRM:  Pt called to let her PCP know that the lisinopril (ZESTRIL) 10 MG tablet has been taken over by her cardiologist now. Dr End.  Pt cancelled her appt because he it was a follow up for that medication.

## 2023-10-24 NOTE — Progress Notes (Signed)
Virtual Visit via Video Note  I connected with Melinda Shaw on 10/24/23 at 11:30 AM EST by a video enabled telemedicine application and verified that I am speaking with the correct person using two identifiers.  Location Provider Location : [facility] Patient Location : Vehicle  Participants: Patient ,Spouse, Provider   I discussed the limitations of evaluation and management by telemedicine and the availability of in person appointments. The patient expressed understanding and agreed to proceed.    I discussed the assessment and treatment plan with the patient. The patient was provided an opportunity to ask questions and all were answered. The patient agreed with the plan and demonstrated an understanding of the instructions.   The patient was advised to call back or seek an in-person evaluation if the symptoms worsen or if the condition fails to improve as anticipated.   BH MD OP Progress Note  10/24/2023 12:34 PM Melinda Shaw  MRN:  [id number]  Chief Complaint:  Chief Complaint  Patient presents with   Follow-up   Depression   Anxiety   Addiction Problem   HPI: Melinda Shaw is a 57 year old female, lives in [place] married, on disability, has a history of PTSD, MDD, OCD, hypertension, COPD, history of CVA on ASA, fibromyalgia, history of appendiceal neoplasm, history of prior ileocecectomy, history of hepatitis was evaluated by telemedicine today.   The patient, a 57 year old individual with a history of hypertension and depression, presented with concerns about a potential interaction between her current medications, lithium and lisinopril. The patient has been on lithium for approximately 4-5 years for treatment-resistant depression and has been on antihypertensive medication for about two years. Recently, the patient's blood pressure began to rise again, prompting her primary care physician to add lisinopril to her regimen. However, the patient expressed concerns  about potential interactions between lithium and lisinopril.  The patient reported a desire to discontinue lithium due to its potential side effects and interactions with lisinopril, and a preference to try other treatments that may be less harsh on her body. She reported that her mood has been generally stable, with occasional mood swings and depressive episodes, but not as severe as in the past. The patient also reported sleep disturbances, with fragmented sleep and a total of approximately six hours of sleep per night.  She also reported feeling sluggish about half the time in the past two weeks.  The patient has been on multiple other medications, including gabapentin and topiramate, both of which also have mood-stabilizing properties. The patient reported no side effects from these medications. She also reported a recent leg injury, which has been causing some difficulty in doing things and has been affecting her sleep due to pain.She reported a recent leg injury due to a [place] attack on her farm, which resulted in an infection and the need for prednisone and pain medication.  In summary, the patient presented with concerns about potential drug interactions between her current medications, lithium and lisinopril, and expressed a desire to discontinue lithium. She reported stable mood with occasional mood swings and depressive episodes, sleep disturbances, and feelings of sluggishness. She also reported a recent leg injury that has been causing some difficulty in doing things and affecting her sleep.  Patient currently denies any suicidality, homicidality or perceptual disturbances.   Visit Diagnosis:    ICD-10-CM   1. PTSD (post-traumatic stress disorder)  F43.10 lithium carbonate 150 MG capsule    topiramate (TOPAMAX) 50 MG tablet    2. Recurrent major depressive  disorder, in full remission (HCC)  F33.42 lithium carbonate 300 MG capsule    lithium carbonate 150 MG capsule    topiramate  (TOPAMAX) 50 MG tablet    3. Obsessive-compulsive disorder with good or fair insight  F42.9 topiramate (TOPAMAX) 50 MG tablet    4. High risk medication use  Z79.899 Lithium level    5. Encounter for lithium monitoring  Z51.81    Z79.899       Past Psychiatric History: I have reviewed past psychiatric history from progress note on 12/06/2022.  Past trials of medications/Seroquel-weight gain, multiple others, does not remember all the names.  Past Medical History:  Past Medical History:  Diagnosis Date   Anemia    H/O   Anxiety    Bronchitis    Chest pain    a. 09/2018 Cath: Nl cors. EF 65%->Med rx (long-acting nitrate added).   Depression    Diastolic dysfunction    a. 10/2018 Echo: EF 55-60%, no rwma, Gr1DD. Mild MR. Nl RV fxn.   DOE (dyspnea on exertion)    Fibromyalgia    GERD (gastroesophageal reflux disease)    Headache    Hyperlipidemia    Hypertension    no meds   Palpitations    a. 01/2019 [facility]): NSR w/ rare PACs & PVCs; b. 04/2020 [facility]: Predominantly sinus rhythm @ 75 (48-141). Rare PACs/PVCs. No significant arrhythmias or prolonged pauses.  No triggered events.   PONV (postoperative nausea and vomiting)     Past Surgical History:  Procedure Laterality Date   ANTERIOR CRUCIATE LIGAMENT REPAIR     CARDIAC CATHETERIZATION     CHOLECYSTECTOMY     CORONARY ANGIOPLASTY     ESOPHAGOGASTRODUODENOSCOPY (EGD) WITH PROPOFOL N/A 08/28/2015   Procedure: ESOPHAGOGASTRODUODENOSCOPY (EGD) WITH PROPOFOL;  Surgeon: Elnita Maxwell, MD;  Location: [facility] ENDOSCOPY;  Service: Endoscopy;  Laterality: N/A;   ESOPHAGOGASTRODUODENOSCOPY (EGD) WITH PROPOFOL N/A 01/31/2022   Procedure: ESOPHAGOGASTRODUODENOSCOPY (EGD) WITH PROPOFOL;  Surgeon: Wyline Mood, MD;  Location: [facility] ENDOSCOPY;  Service: Gastroenterology;  Laterality: N/A;   KNEE SURGERY     left knee    LEFT HEART CATH AND CORONARY ANGIOGRAPHY Left 09/22/2018   Procedure: LEFT HEART CATH AND CORONARY ANGIOGRAPHY;  Surgeon:  [name], MD;  Location: [facility] INVASIVE CV LAB;  Service: Cardiovascular;  Laterality: Left;   MASS EXCISION Left 01/26/2016   Procedure: EXCISION OF LEFT WRIST VOLAR MASS;  Surgeon: Dairl Ponder, MD;  Location: [facility];  Service: Orthopedics;  Laterality: Left;   NOSE SURGERY     POLYPECTOMY     TUBAL LIGATION     VIDEO BRONCHOSCOPY N/A 05/29/2015   Procedure: VIDEO BRONCHOSCOPY WITHOUT FLUORO;  Surgeon: Stephanie Acre, MD;  Location: [facility] ORS;  Service: Cardiopulmonary;  Laterality: N/A;    Family Psychiatric History: I have reviewed family psychiatric history from progress note on 12/06/2022.  Family History:  Family History  Problem Relation Age of Onset   Dementia Mother    Alcohol abuse Mother    COPD Mother    High Cholesterol Mother    Heart failure Mother    Hypertension Mother    Dementia Father    Alcohol abuse Father    Hypertension Father    AAA (abdominal aortic aneurysm) Sister    COPD Sister    High Cholesterol Sister     Social History: I have reviewed social history from progress note on 12/06/2022. Social History   Socioeconomic History   Marital status: Married  Spouse name: david   Number of children: 3   Years of education: Not on file   Highest education level: 10th grade  Occupational History   Not on file  Tobacco Use   Smoking status: Never   Smokeless tobacco: Never  Vaping Use   Vaping status: Never Used  Substance and Sexual Activity   Alcohol use: No    Alcohol/week: 0.0 standard drinks of alcohol   Drug use: No   Sexual activity: Yes  Other Topics Concern   Not on file  Social History Narrative   Not on file   Social Drivers of Health   Financial Resource Strain: Low Risk  (10/21/2023)   Overall Financial Resource Strain (CARDIA)    Difficulty of Paying Living Expenses: Not very hard  Recent Concern: Financial Resource Strain - Medium Risk (10/13/2023)   Received from [facility]    Overall Financial Resource Strain (CARDIA)    Difficulty of Paying Living Expenses: Somewhat hard  Food Insecurity: No Food Insecurity (10/21/2023)   Hunger Vital Sign    Worried About Running Out of Food in the Last Year: Never true    Ran Out of Food in the Last Year: Never true  Recent Concern: Food Insecurity - Food Insecurity Present (10/13/2023)   Received from [facility]   Hunger Vital Sign    Worried About Running Out of Food in the Last Year: Sometimes true    Ran Out of Food in the Last Year: Never true  Transportation Needs: No Transportation Needs (10/21/2023)   PRAPARE - Administrator, Civil Service (Medical): No    Lack of Transportation (Non-Medical): No  Recent Concern: Transportation Needs - Unmet Transportation Needs (10/13/2023)   Received from [facility] - Transportation    In the past 12 months, has lack of transportation kept you from medical appointments or from getting medications?: Yes    Lack of Transportation (Non-Medical): Yes  Physical Activity: Sufficiently Active (10/21/2023)   Exercise Vital Sign    Days of Exercise per Week: 7 days    Minutes of Exercise per Session: 60 min  Stress: No Stress Concern Present (10/21/2023)   [employer] of Occupational Health - Occupational Stress Questionnaire    Feeling of Stress : Only a little  Recent Concern: Stress - Stress Concern Present (10/05/2023)   [employer] of Occupational Health - Occupational Stress Questionnaire    Feeling of Stress : To some extent  Social Connections: Socially Isolated (10/21/2023)   Social Connection and Isolation Panel [NHANES]    Frequency of Communication with Friends and Family: Once a week    Frequency of Social Gatherings with Friends and Family: Never    Attends Religious Services: Never    Database administrator or Organizations: No    Attends Banker Meetings: Never    Marital Status:  Married    Allergies:  Allergies  Allergen Reactions   Cherry Swelling    Swelling of lips and throat   Other Shortness Of Breath    Stopped breathing, Swelling of throat, Stopped breathing, Swelling of throat, Stopped breathing, Swelling of throat   Shellfish Allergy Shortness Of Breath   Sulfa Antibiotics Shortness Of Breath    Stopped breathing, Swelling of throat   Bee Venom     Swelling of throat   Imdur [Isosorbide Nitrate]     Worsening headaches     Metabolic Disorder Labs: Lab  Results  Component Value Date   HGBA1C 5.6 09/05/2022   No results found for: "PROLACTIN" Lab Results  Component Value Date   CHOL 121 04/18/2023   TRIG 81 04/18/2023   HDL 52 04/18/2023   CHOLHDL 2.3 04/18/2023   VLDL 16 04/18/2023   LDLCALC 53 04/18/2023   Lab Results  Component Value Date   TSH 2.212 04/18/2023   TSH 2.480 09/05/2022    Therapeutic Level Labs: Lab Results  Component Value Date   LITHIUM 0.58 (L) 10/13/2023   LITHIUM 0.76 05/21/2023   No results found for: "VALPROATE" No results found for: "CBMZ"  Current Medications: Current Outpatient Medications  Medication Sig Dispense Refill   albuterol (VENTOLIN HFA) 108 (90 Base) MCG/ACT inhaler Inhale 1-2 puffs into the lungs every 6 (six) hours as needed for wheezing or shortness of breath. 18 g 11   aspirin EC 81 MG tablet Take 81 mg by mouth daily.     cetirizine (ZYRTEC) 10 MG tablet Take 1 tablet (10 mg total) by mouth daily. 30 tablet 11   clonazePAM (KLONOPIN) 0.5 MG tablet TAKE 1 TABLET THREE TIMES A WEEK ONLY FOR SEVERE ANXIETY ATTACKS 21 tablet 2   cyanocobalamin (VITAMIN B12) 1000 MCG/ML injection INJECT 1 ML INTO THE MUSCLE EVERY THIRTY DAYS     dicyclomine (BENTYL) 10 MG capsule Take 1 capsule (10 mg total) by mouth 3 (three) times daily before meals. 90 capsule 2   diltiazem (CARDIZEM CD) 240 MG 24 hr capsule Take 1 capsule (240 mg total) by mouth daily. 90 capsule 3   diltiazem (CARDIZEM) 30 MG tablet  Take 1 tablet (30 mg total) by mouth daily as needed (palpitations). 90 tablet 0   docusate sodium (COLACE) 100 MG capsule Take 1 capsule (100 mg total) by mouth 2 (two) times daily as needed for mild constipation. 180 capsule 3   EPINEPHrine 0.3 mg/0.3 mL IJ SOAJ injection Inject 0.3 mg into the muscle as needed for anaphylaxis.     eszopiclone (LUNESTA) 1 MG TABS tablet TAKE 1 TABLET BY MOUTH IMMEDIATELY BEFORE BEDTIME 90 tablet 1   famotidine (PEPCID) 20 MG tablet TAKE 1 TABLET BY MOUTH AT BEDTIME 90 tablet 1   fluconazole (DIFLUCAN) 150 MG tablet Take 1 tablet by mouth daily.     gabapentin (NEURONTIN) 400 MG capsule Take 400 mg by mouth 3 (three) times daily.     hydrOXYzine (VISTARIL) 25 MG capsule Take 1 capsule (25 mg total) by mouth 3 (three) times daily as needed for anxiety. And sleep 270 capsule 1   ibuprofen (ADVIL) 600 MG tablet Take by mouth.     lisinopril (ZESTRIL) 10 MG tablet Take 1 tablet (10 mg total) by mouth daily. 90 tablet 3   [START ON 10/30/2023] lithium carbonate 150 MG capsule Take 1 capsule (150 mg total) by mouth daily for 14 days. please start after stopping 300 mg daily in a week. 14 capsule 0   mometasone-formoterol (DULERA) 200-5 MCG/ACT AERO Inhale into the lungs.     naproxen (NAPROSYN) 500 MG tablet Take by mouth.     nitroGLYCERIN (NITROSTAT) 0.4 MG SL tablet PLACE ONE TABLET UNDER THE TONGUE EVERY 5 MINUTES AS NEEDED FOR CHEST PAIN. MAXIMUM OF 3 DOSES. 25 tablet 3   nortriptyline (PAMELOR) 10 MG capsule Take 10 mg by mouth at bedtime.     nystatin cream (MYCOSTATIN) Apply topically.     omeprazole (PRILOSEC) 40 MG capsule TAKE 1 CAPSULE BY MOUTH DAILY 90 capsule 1  ondansetron (ZOFRAN ODT) 4 MG disintegrating tablet Take 1 tablet (4 mg total) by mouth every 8 (eight) hours as needed for nausea or vomiting. 12 tablet 0   predniSONE (DELTASONE) 10 MG tablet Take 5 tabs on days 1,2. Take 4 tabs on days 3,4. Take 3 tabs days 5,6. Take 2 tabs days 7,8. Take 1  tabs days 9,10.     rosuvastatin (CRESTOR) 10 MG tablet Take 1 tablet (10 mg total) by mouth at bedtime. 90 tablet 3   sertraline (ZOLOFT) 50 MG tablet TAKE 1 TABLET BY MOUTH DAILY WITH BREAKFAST 90 tablet 3   tiZANidine (ZANAFLEX) 4 MG tablet Take 4 mg by mouth once.     HYDROcodone-acetaminophen (NORCO/VICODIN) 5-325 MG tablet Take one tablet at night for pain; may take up to every 8 hours as needed for pain if not working or driving (Patient not taking: Reported on 10/24/2023)     lithium carbonate 300 MG capsule Take 1 capsule (300 mg total) by mouth at bedtime. 7 capsule 0   oxyCODONE-acetaminophen (PERCOCET/ROXICET) 5-325 MG tablet Take 1 tablet by mouth 4 (four) times daily as needed. (Patient not taking: Reported on 10/24/2023)     potassium chloride SA (KLOR-CON M20) 20 MEQ tablet Take 1 tablet (20 mEq total) by mouth 2 (two) times daily. 180 tablet 3   topiramate (TOPAMAX) 50 MG tablet Take 1.5 tablets (75 mg total) by mouth daily. 45 tablet 1   No current facility-administered medications for this visit.     Musculoskeletal: Strength & Muscle Tone:  UTA Gait & Station:  Seated Patient leans: N/A  Psychiatric Specialty Exam: Review of Systems  Psychiatric/Behavioral:  Positive for sleep disturbance.     Last menstrual period 09/24/2016.There is no height or weight on file to calculate BMI.  General Appearance: Casual  Eye Contact:  Fair  Speech:  Normal Rate  Volume:  Normal  Mood:   Mild sadness on and off but does not affect her functioning  Affect:  Appropriate  Thought Process:  Goal Directed and Descriptions of Associations: Intact  Orientation:  Full (Time, Place, and Person)  Thought Content: Logical   Suicidal Thoughts:  No  Homicidal Thoughts:  No  Memory:  Immediate;   Fair Recent;   Fair Remote;   Fair  Judgement:  Fair  Insight:  Fair  Psychomotor Activity:  Normal  Concentration:  Concentration: Fair and Attention Span: Fair  Recall:  [employer] of  Knowledge: Fair  Language: Fair  Akathisia:  No  Handed:  Right  AIMS (if indicated): not done  Assets:  Desire for Improvement Housing Intimacy Social Support Transportation  ADL's:  Intact  Cognition: WNL  Sleep:   Restless likely due to pain   Screenings: GAD-7    Advertising copywriter from 05/07/2023 in [facility] Video Visit from 02/18/2023 in [facility] Counselor from 02/12/2023 in [facility] Office Visit from 01/22/2023 in [facility] Office Visit from 12/06/2022 in [facility]  Total GAD-7 Score 9 9 6 12 12       PHQ2-9    Flowsheet Row Video Visit from 10/24/2023 in [facility] Clinical Support from 10/21/2023 in [facility] Office Visit from 06/13/2023 in [facility] Counselor from 05/07/2023 in [facility] Office Visit from 04/21/2023 in [facility]  PHQ-2 Total Score 1 1 1  0 0  PHQ-9 Total Score 11 3 7 4  0      Flowsheet Row Video Visit from 10/24/2023 in [facility] ED from 10/01/2023 in [facility] Video Visit from 08/14/2023 in [facility]  C-SSRS RISK CATEGORY No Risk No Risk No Risk        Assessment and Plan: Melinda Shaw is a 57 year old female on disability, lives in [place], has a history of PTSD, MDD, OCD, multiple medical problems, recent concerns about lithium interacting with lisinopril, discussed assessment and plan as noted below.  Encounter for lithium monitoring-lithium Discontinuation   Patient has been on lithium for 4-5 years for treatment-resistant depression.  Prefers discontinuation due to side effects and potential interaction with lisinopril for hypertension. Current lithium level is 0.58, BUN 13, creatinine 1.3. Discussed risks of discontinuation, including potential worsening of mood symptoms. Tapering off lithium over 3-4 weeks is necessary. Alternative mood stabilizers considered include gabapentin and topiramate, with a plan to increase topiramate dosage.   - Reduce lithium to 300 mg daily for 7 days   - Then reduce lithium to 150 mg daily for 14 days   - Order lithium level in 2 weeks   - Educate on potential need for immediate help if mood worsens   -I have coordinated care with cardiologist-Dr. End and as per cardiology patient is currently being continued on lisinopril.  Lisinopril does have interaction with lithium.  Depression in remission Patient reports improved mood but still experiences mood swings, sleep disturbances, low energy, and concentration issues. No suicidal thoughts. Considering alternative mood stabilizers due to lithium discontinuation. Discussed potential need for antipsychotic medication if depression worsens, including Seroquel, Rexulti, and Vraylar, and their side effects.   - Increase Topamax to 75 mg at night   - Monitor mood symptoms   - Consider adding antipsychotic if depression worsens (e.g., Rexulti, Vraylar)    PTSD-stable Patient's PTSD is currently well-managed on the current medication. - Sertraline 50 mg p.o. daily - Lunesta 1 mg p.o. nightly for sleep - Continue psychotherapy with Ms. Florentina Addison bounds.  OCD-improving Currently managed on medications and psychotherapy. -Klonopin 0.5 mg few times a week only for severe panic attacks -Hydroxyzine 25 mg 3 times a day as needed -Sertraline 50 mg p.o. daily -Continue CBT  Hypertension   Patient on lisinopril 10 mg for rising blood pressure. Monitoring by cardiologist. Discussed potential interaction with lithium and need for close monitoring of kidney  function.   - Continue lisinopril 10 mg   - Follow up with cardiologist for kidney function tests in 2 weeks    Leg Injury   Leg injury causing pain and sleep disturbances. Currently on hydrocodone, naproxen, and prednisone for pain and inflammation.   - Continue current pain management regimen   - Monitor for sleep disturbances related to pain    Follow-up   - Schedule follow-up visit on February 5th at 1:30 PM   - Contact staff if any changes or concerns arise before the scheduled visit.   Collaboration of Care: Collaboration of Care: Other patient encouraged to continue follow-up with cardiology visit, get labs completed as well as follow up with therapist.  Crisis plan discussed with patient.  Patient/Guardian was advised Release of Information must be obtained prior to any record release in order to collaborate their care with an outside provider. Patient/Guardian was advised if they have not  already done so to contact the registration department to sign all necessary forms in order for [place] to release information regarding their care.   Consent: Patient/Guardian gives verbal consent for treatment and assignment of benefits for services provided during this visit. Patient/Guardian expressed understanding and agreed to proceed.    I have spent atleast 40 minutes face to face with patient today which includes the time spent for preparing to see the patient ( e.g., review of test, records ), obtaining and to review and separately obtained history , ordering medications and test ,psychoeducation and supportive psychotherapy and care coordination with cardiologist- Dr.End,as well as documenting clinical information in electronic health record,interpreting and communication of test results.   This note was generated in part or whole with voice recognition software. Voice recognition is usually quite accurate but there are transcription errors that can and very often do occur. I apologize for any  typographical errors that were not detected and corrected.    Jomarie Longs, MD 10/24/2023, 12:34 PM

## 2023-10-27 ENCOUNTER — Telehealth: Payer: Self-pay | Admitting: Psychiatry

## 2023-10-27 DIAGNOSIS — F3342 Major depressive disorder, recurrent, in full remission: Secondary | ICD-10-CM

## 2023-10-27 DIAGNOSIS — F4310 Post-traumatic stress disorder, unspecified: Secondary | ICD-10-CM

## 2023-10-27 DIAGNOSIS — F429 Obsessive-compulsive disorder, unspecified: Secondary | ICD-10-CM

## 2023-10-27 MED ORDER — TOPIRAMATE 50 MG PO TABS: 125.0000 mg | ORAL_TABLET | Freq: Every day | ORAL | 1 refills | Status: DC

## 2023-10-27 NOTE — Telephone Encounter (Signed)
Patient to increase Topamax by 25 mg increments, 125 mg total dosage at this time.  Since she reports previous dose as  100 mg and not a 50 mg.  I have sent a new prescription to pharmacy.

## 2023-10-30 ENCOUNTER — Telehealth: Payer: PPO | Admitting: Psychiatry

## 2023-11-01 ENCOUNTER — Other Ambulatory Visit: Payer: Self-pay | Admitting: Psychiatry

## 2023-11-01 DIAGNOSIS — F4310 Post-traumatic stress disorder, unspecified: Secondary | ICD-10-CM

## 2023-11-01 DIAGNOSIS — F3342 Major depressive disorder, recurrent, in full remission: Secondary | ICD-10-CM

## 2023-11-03 ENCOUNTER — Ambulatory Visit: Payer: PPO | Admitting: Family Medicine

## 2023-11-10 ENCOUNTER — Other Ambulatory Visit
Admission: RE | Admit: 2023-11-10 | Discharge: 2023-11-10 | Disposition: A | Discharge status: Home / Self Care | Payer: PPO | Attending: Psychiatry | Admitting: Psychiatry

## 2023-11-10 DIAGNOSIS — Z79899 Other long term (current) drug therapy: Secondary | ICD-10-CM | POA: Insufficient documentation

## 2023-11-10 LAB — LITHIUM LEVEL: Lithium Lvl: 0.12 mmol/L — ABNORMAL LOW (ref 0.60–1.20)

## 2023-11-11 ENCOUNTER — Encounter: Payer: Self-pay | Admitting: Internal Medicine

## 2023-11-11 LAB — BASIC METABOLIC PANEL
BUN/Creatinine Ratio: 13 (ref 9–23)
BUN: 12 mg/dL (ref 6–24)
CO2: 19 mmol/L — ABNORMAL LOW (ref 20–29)
Calcium: 9.9 mg/dL (ref 8.7–10.2)
Chloride: 110 mmol/L — ABNORMAL HIGH (ref 96–106)
Creatinine, Ser: 0.94 mg/dL (ref 0.57–1.00)
Glucose: 92 mg/dL (ref 70–99)
Potassium: 4.6 mmol/L (ref 3.5–5.2)
Sodium: 143 mmol/L (ref 134–144)
eGFR: 71 mL/min/{1.73_m2} (ref >59)

## 2023-11-17 ENCOUNTER — Telehealth: Payer: Self-pay | Admitting: Internal Medicine

## 2023-11-17 NOTE — Telephone Encounter (Signed)
Pt returning nurses phone call regarding results. Please advise.

## 2023-11-17 NOTE — Telephone Encounter (Signed)
 Attempted to contact pt Per DPR, left detailed message with results along with Dr.End's recommendations Pt advised to contact office with any questions

## 2023-11-24 NOTE — Progress Notes (Signed)
 Cardiology Office Note    Date:  11/25/2023   ID:  Melinda Shaw, DOB 03-31-1966, MRN [id number]  PCP:  Patient, No Pcp Per  Cardiologist:  Lonni Hanson, MD  Electrophysiologist:  None   Chief Complaint: Follow up  History of Present Illness:   Melinda Shaw is a 58 y.o. female with history of chest pain with normal coronary arteries by coronary CTA, dyspnea on exertion, palpitations, hypertension, hyperlipidemia, fibromyalgia, bronchitis, and GERD who presents for follow-up of HTN.  Echo in 2016 showed an EF of 60 to 65%, no regional wall motion abnormalities and normal diastolic function.  LHC in 09/2018 showed no angiographically significant CAD with normal LV systolic function and filling pressure.  Echo in 10/2018 showed an EF of 55 to 60%, no regional wall motion abnormalities, grade 1 diastolic dysfunction, mild mitral regurgitation normal RV systolic function and RVSP.  Zio patch in 04/2020 showed a predominant rhythm of sinus with an average rate of 75 bpm with rare PACs and PVCs.  No significant arrhythmia or prolonged pause was observed.  Coronary CTA in 01/2021 showed a calcium  score of 0 with no evidence of CAD.  Zio patch monitoring in 05/2023 showed a predominant rhythm of sinus with 2 episodes of SVT lasting up to 5 beats, and rare PACs and PVCs.  There were 33 patient triggered events, most of which corresponded to sinus rhythm.  A few triggered events also contained PACs.  She was most recently seen in 10/2019 for noting concerns about elevated BP readings and medication changes.  She notes the symptoms began after she received a puncture wound from a turkey that she keeps.  She was bordering on sepsis and was on high-powered antibiotics.  In the setting of this, her blood pressures were elevated, prompting [name] to add lisinopril  for blood pressure control.  [name] subsequently saw her mental health provider, Dr. Refugia, who noted negligible increase in creatinine  (0.91->1.03) and recommended cessation of lisinopril  in the setting of long-term lithium  use.  He otherwise suggested weaning off of lithium .  She continued to note elevated readings at her PCP occasional mild chest tightness that she attributed to stress, similar to prior visits.  Blood pressure in the office was mildly elevated at 156/86.  After discussion with the patient, she was resumed on lisinopril  10 mg daily with follow-up labs noted to have stable renal function and potassium.  She comes in accompanied by her husband today and is without symptoms of exertional angina or cardiac decompensation.  She does continue to note occasional palpitations that are stable.  Blood pressure at home has ranged from 115 to the 160s systolic.  Adherent and tolerating diltiazem  to 40 mg and lisinopril  10 mg.  Minimize his sodium intake, does not eat out at restaurants often.  She wonders if she needs to increase her lisinopril .  Overall, feels better off lithium .   Labs independently reviewed: 10/2023 - BUN 12, serum creatinine 0.94, potassium 4.6 09/2023 - Hgb 13.9, PLT 231, albumin 4.2, AST/ALT normal 04/2023 - TC 121, TG 81, HDL 52, LDL 53, magnesium 2.4, TSH normal 08/2022 - A1c 5.6  Past Medical History:  Diagnosis Date   Anemia    H/O   Anxiety    Bronchitis    Chest pain    a. 09/2018 Cath: Nl cors. EF 65%->Med rx (long-acting nitrate added).   Depression    Diastolic dysfunction    a. 10/2018 Echo: EF 55-60%, no rwma, Gr1DD.  Mild MR. Nl RV fxn.   DOE (dyspnea on exertion)    Fibromyalgia    GERD (gastroesophageal reflux disease)    Headache    Hyperlipidemia    Hypertension    no meds   Palpitations    a. 01/2019 Zio [facility]): NSR w/ rare PACs & PVCs; b. 04/2020 Zio: Predominantly sinus rhythm @ 75 (48-141). Rare PACs/PVCs. No significant arrhythmias or prolonged pauses.  No triggered events.   PONV (postoperative nausea and vomiting)     Past Surgical History:  Procedure Laterality Date    ANTERIOR CRUCIATE LIGAMENT REPAIR     CARDIAC CATHETERIZATION     CHOLECYSTECTOMY     CORONARY ANGIOPLASTY     ESOPHAGOGASTRODUODENOSCOPY (EGD) WITH PROPOFOL  N/A 08/28/2015   Procedure: ESOPHAGOGASTRODUODENOSCOPY (EGD) WITH PROPOFOL ;  Surgeon: Donnice Vaughn Manes, MD;  Location: [facility] ENDOSCOPY;  Service: Endoscopy;  Laterality: N/A;   ESOPHAGOGASTRODUODENOSCOPY (EGD) WITH PROPOFOL  N/A 01/31/2022   Procedure: ESOPHAGOGASTRODUODENOSCOPY (EGD) WITH PROPOFOL ;  Surgeon: Therisa Bi, MD;  Location: [facility] ENDOSCOPY;  Service: Gastroenterology;  Laterality: N/A;   KNEE SURGERY     left knee    LEFT HEART CATH AND CORONARY ANGIOGRAPHY Left 09/22/2018   Procedure: LEFT HEART CATH AND CORONARY ANGIOGRAPHY;  Surgeon: Mady Bruckner, MD;  Location: [facility] INVASIVE CV LAB;  Service: Cardiovascular;  Laterality: Left;   MASS EXCISION Left 01/26/2016   Procedure: EXCISION OF LEFT WRIST VOLAR MASS;  Surgeon: Donnice Robinsons, MD;  Location: [facility];  Service: Orthopedics;  Laterality: Left;   NOSE SURGERY     POLYPECTOMY     TUBAL LIGATION     VIDEO BRONCHOSCOPY N/A 05/29/2015   Procedure: VIDEO BRONCHOSCOPY WITHOUT FLUORO;  Surgeon: Jorie Cha, MD;  Location: [facility] ORS;  Service: Cardiopulmonary;  Laterality: N/A;    Current Medications: Current Meds  Medication Sig   albuterol  (VENTOLIN  HFA) 108 (90 Base) MCG/ACT inhaler Inhale 1-2 puffs into the lungs every 6 (six) hours as needed for wheezing or shortness of breath.   aspirin  EC 81 MG tablet Take 81 mg by mouth daily.   cetirizine  (ZYRTEC ) 10 MG tablet Take 1 tablet (10 mg total) by mouth daily.   clonazePAM  (KLONOPIN ) 0.5 MG tablet TAKE 1 TABLET THREE TIMES A WEEK ONLY FOR SEVERE ANXIETY ATTACKS   cyanocobalamin (VITAMIN B12) 1000 MCG/ML injection INJECT 1 ML INTO THE MUSCLE EVERY THIRTY DAYS   dicyclomine  (BENTYL ) 10 MG capsule Take 1 capsule (10 mg total) by mouth 3 (three) times daily before meals.   diltiazem  (CARDIZEM  CD)  240 MG 24 hr capsule Take 1 capsule (240 mg total) by mouth daily.   diltiazem  (CARDIZEM ) 30 MG tablet Take 1 tablet (30 mg total) by mouth daily as needed (palpitations).   docusate sodium  (COLACE) 100 MG capsule Take 1 capsule (100 mg total) by mouth 2 (two) times daily as needed for mild constipation.   EPINEPHrine  0.3 mg/0.3 mL IJ SOAJ injection Inject 0.3 mg into the muscle as needed for anaphylaxis.   famotidine  (PEPCID ) 20 MG tablet TAKE 1 TABLET BY MOUTH AT BEDTIME   gabapentin (NEURONTIN) 400 MG capsule Take 400 mg by mouth daily. prn   hydrOXYzine  (VISTARIL ) 25 MG capsule Take 1 capsule (25 mg total) by mouth 3 (three) times daily as needed for anxiety. And sleep   lisinopril  (ZESTRIL ) 20 MG tablet Take 1 tablet (20 mg total) by mouth daily.   mometasone -formoterol  (DULERA) 200-5 MCG/ACT AERO Inhale into the lungs.   nitroGLYCERIN  (NITROSTAT ) 0.4 MG SL tablet  PLACE ONE TABLET UNDER THE TONGUE EVERY 5 MINUTES AS NEEDED FOR CHEST PAIN. MAXIMUM OF 3 DOSES.   nortriptyline (PAMELOR) 10 MG capsule Take 10 mg by mouth at bedtime.   nystatin cream (MYCOSTATIN) Apply topically.   omeprazole  (PRILOSEC) 40 MG capsule TAKE 1 CAPSULE BY MOUTH DAILY   ondansetron  (ZOFRAN  ODT) 4 MG disintegrating tablet Take 1 tablet (4 mg total) by mouth every 8 (eight) hours as needed for nausea or vomiting.   potassium chloride  SA (KLOR-CON  M20) 20 MEQ tablet Take 1 tablet (20 mEq total) by mouth 2 (two) times daily.   rosuvastatin  (CRESTOR ) 10 MG tablet Take 1 tablet (10 mg total) by mouth at bedtime.   sertraline  (ZOLOFT ) 50 MG tablet TAKE 1 TABLET BY MOUTH DAILY WITH BREAKFAST   tiZANidine  (ZANAFLEX ) 4 MG tablet Take 4 mg by mouth once.   topiramate  (TOPAMAX ) 50 MG tablet Take 2.5 tablets (125 mg total) by mouth daily.   [DISCONTINUED] lisinopril  (ZESTRIL ) 10 MG tablet Take 1 tablet (10 mg total) by mouth daily.    Allergies:   Cherry, Other, Shellfish allergy, Sulfa antibiotics, Bee venom, and Imdur   [isosorbide  nitrate]   Social History   Socioeconomic History   Marital status: Married    Spouse name: david   Number of children: 3   Years of education: Not on file   Highest education level: 10th grade  Occupational History   Not on file  Tobacco Use   Smoking status: Never   Smokeless tobacco: Never  Vaping Use   Vaping status: Never Used  Substance and Sexual Activity   Alcohol use: No    Alcohol/week: 0.0 standard drinks of alcohol   Drug use: No   Sexual activity: Yes  Other Topics Concern   Not on file  Social History Narrative   Not on file   Social Drivers of Health   Financial Resource Strain: Low Risk  (10/21/2023)   Overall Financial Resource Strain (CARDIA)    Difficulty of Paying Living Expenses: Not very hard  Recent Concern: Financial Resource Strain - Medium Risk (10/13/2023)   Received from [facility]   Overall Financial Resource Strain (CARDIA)    Difficulty of Paying Living Expenses: Somewhat hard  Food Insecurity: No Food Insecurity (10/21/2023)   Hunger Vital Sign    Worried About Running Out of Food in the Last Year: Never true    Ran Out of Food in the Last Year: Never true  Recent Concern: Food Insecurity - Food Insecurity Present (10/13/2023)   Received from [facility]   Hunger Vital Sign    Worried About Running Out of Food in the Last Year: Sometimes true    Ran Out of Food in the Last Year: Never true  Transportation Needs: No Transportation Needs (10/21/2023)   PRAPARE - Administrator, Civil Service (Medical): No    Lack of Transportation (Non-Medical): No  Recent Concern: Transportation Needs - Unmet Transportation Needs (10/13/2023)   Received from [facility] - Transportation    In the past 12 months, has lack of transportation kept you from medical appointments or from getting medications?: Yes    Lack of Transportation (Non-Medical): Yes  Physical  Activity: Sufficiently Active (10/21/2023)   Exercise Vital Sign    Days of Exercise per Week: 7 days    Minutes of Exercise per Session: 60 min  Stress: No Stress Concern Present (10/21/2023)   [employer]  [employer] - Occupational Stress Questionnaire    Feeling of Stress : Only a little  Recent Concern: Stress - Stress Concern Present (10/05/2023)   [employer] of Occupational [employer] - Occupational Stress Questionnaire    Feeling of Stress : To some extent  Social Connections: Socially Isolated (10/21/2023)   Social Connection and Isolation Panel [NHANES]    Frequency of Communication with Friends and Family: Once a week    Frequency of Social Gatherings with Friends and Family: Never    Attends Religious Services: Never    Database Administrator or Organizations: No    Attends Engineer, Structural: Never    Marital Status: Married     Family History:  The patient's family history includes AAA (abdominal aortic aneurysm) in her sister; Alcohol abuse in her father and mother; COPD in her mother and sister; Dementia in her father and mother; Heart failure in her mother; High Cholesterol in her mother and sister; Hypertension in her father and mother.  ROS:   12-point review of systems is negative unless otherwise noted in the HPI.   EKGs/Labs/Other Studies Reviewed:    Studies reviewed were summarized above. The additional studies were reviewed today:  Zio patch 05/2023:   The patient was monitored for 13 days, 20 hours.   The predominant rhythm was sinus with an average rate of 70 bpm (range 43-154 bpm).   There were rare PACs and PVCs.   two supraventricular runs were observed, lasting up to 5 beats with a maximum rate of 154 bpm.   No sustained arrhythmia or prolonged pauses occurred.   There were 33 patient triggered events, most of which corresponded to sinus rhythm.  A few triggered events also contained PACs.   Predominantly sinus rhythm  with rare PACs and PVCs as well as two brief supraventricular runs.  No significant arrhythmia identified to explain the patient's symptoms. __________  Coronary CTA 01/11/2021: Aorta:  Normal size.  No calcifications.  No dissection.   Aortic Valve:  Trileaflet.  No calcifications.   Coronary Arteries:  Normal coronary origin.  Right dominance.   RCA is a dominant artery that gives rise to PDA and PLA. There is no plaque.   Left main is a large artery that gives rise to LAD and LCX arteries.   LAD has no plaque or evidence for disease.   LCX is a non-dominant artery that gives rise to two obtuse marginal branches. There is no plaque.   Other findings:   Normal pulmonary vein drainage into the left atrium.   Normal left atrial appendage without a thrombus.   Normal size of the pulmonary artery.   IMPRESSION: 1. Normal coronary calcium  score of 0. Patient is low risk for coronary events. 2. Normal coronary origin with right dominance. 3. No evidence of CAD. __________  Zio patch 04/2020: The patient was monitored for 12 days, 23 hours. The predominant rhythm was sinus with an average rate of 75 bpm (range 48-141 bpm). There were rare PAC's and PVC's. No significant arrhythmia or prolonged pause was observed. There were no patient triggered events.   Predominantly sinus rhythm with rare PAC's and PVC's.  No significant arrhythmia noted. __________  2D echo 10/29/2018: - Left ventricle: The cavity size was normal. Systolic function was    normal. The estimated ejection fraction was in the range of 55%    to 60%. Wall motion was normal; there were no regional wall    motion abnormalities. Doppler parameters are  consistent with    abnormal left ventricular relaxation (grade 1 diastolic    dysfunction).  - Mitral valve: There was mild regurgitation.  - Left atrium: The atrium was normal in size.  - Right ventricle: Systolic function was normal.  - Pulmonary arteries:  Systolic pressure was within the normal    range.  __________  [facility] 09/22/2018: Conclusions: No angiographically significant coronary artery disease. Normal left ventricular systolic function and filling pressure.   Recommendations:  Continue medical therapy, including propranolol and isosorbide  mononitrate in case there is an element of microvascular dysfunction and/or coronary vasospasm. Primary prevention of coronary artery disease.   No indication for antiplatelet therapy at this time.  __________  2D echo 04/17/2015: - Left ventricle: The cavity size was normal. Wall thickness was    normal. Systolic function was normal. The estimated ejection    fraction was in the range of 60% to 65%. Wall motion was normal;    there were no regional wall motion abnormalities. Left    ventricular diastolic function parameters were normal.   Impressions:   - Normal study.     EKG:  EKG is ordered today.  The EKG ordered today demonstrates NSR, 63 bpm, no acute ST-T changes  Recent Labs: 04/18/2023: Magnesium 2.4; TSH 2.212 10/01/2023: Hemoglobin 13.5; Platelets 218 11/10/2023: BUN 12; Creatinine, Ser 0.94; Potassium 4.6; Sodium 143  Recent Lipid Panel    Component Value Date/Time   CHOL 121 04/18/2023 1150   TRIG 81 04/18/2023 1150   HDL 52 04/18/2023 1150   CHOLHDL 2.3 04/18/2023 1150   VLDL 16 04/18/2023 1150   LDLCALC 53 04/18/2023 1150    PHYSICAL EXAM:    VS:  BP 131/80 (BP Location: Left Arm, Patient Position: Sitting, Cuff Size: Normal)   Pulse 63   Ht 5' 2 (1.575 m)   Wt 153 lb (69.4 kg)   LMP 09/24/2016 (Approximate)   SpO2 99%   BMI 27.98 kg/m   BMI: Body mass index is 27.98 kg/m.  Physical Exam Vitals reviewed.  Constitutional:      Appearance: She is well-developed.  HENT:     Head: Normocephalic and atraumatic.  Eyes:     General:        Right eye: No discharge.        Left eye: No discharge.  Neck:     Vascular: No JVD.  Cardiovascular:     Rate  and Rhythm: Normal rate and regular rhythm.     Heart sounds: Normal heart sounds, S1 normal and S2 normal. Heart sounds not distant. No midsystolic click and no opening snap. No murmur heard.    No friction rub.  Pulmonary:     Effort: Pulmonary effort is normal. No respiratory distress.     Breath sounds: Normal breath sounds. No decreased breath sounds, wheezing, rhonchi or rales.  Chest:     Chest wall: No tenderness.  Abdominal:     General: There is no distension.  Musculoskeletal:     Cervical back: Normal range of motion.  Skin:    General: Skin is warm and dry.     Nails: There is no clubbing.  Neurological:     Mental Status: She is alert and oriented to person, place, and time.  Psychiatric:        Speech: Speech normal.        Behavior: Behavior normal.        Thought Content: Thought content normal.  Judgment: Judgment normal.     Wt Readings from Last 3 Encounters:  11/25/23 153 lb (69.4 kg)  10/23/23 155 lb 8 oz (70.5 kg)  10/06/23 155 lb (70.3 kg)     ASSESSMENT & PLAN:   HTN: Blood pressure is improved in the office today at 131/80.  She does continue to note some elevations in blood pressure at home..  She does watch her sodium intake.  Increase lisinopril  to 20 mg daily with a follow-up BMP 1 to 2 weeks later.  Continue Cardizem  CD 240 mg daily.  Chest pain and palpitations: Symptoms overall atypical and stable.  Reassuring cardiac workup as outlined above.  She remains on diltiazem  for palpitations.     Disposition: F/u with Dr. Mady or an APP in 1 month.   Medication Adjustments/Labs and Tests Ordered: Current medicines are reviewed at length with the patient today.  Concerns regarding medicines are outlined above. Medication changes, Labs and Tests ordered today are summarized above and listed in the Patient Instructions accessible in Encounters.   Signed, Bernardino Bring, PA-C 11/25/2023 5:05 PM     [facility] [place] [address] [place], [place] [address] [phone]

## 2023-11-25 ENCOUNTER — Ambulatory Visit: Payer: PPO | Attending: Physician Assistant | Admitting: Physician Assistant

## 2023-11-25 ENCOUNTER — Encounter: Payer: Self-pay | Admitting: Physician Assistant

## 2023-11-25 VITALS — BP 131/80 | HR 63 | Ht 62.0 in | Wt 153.0 lb

## 2023-11-25 DIAGNOSIS — R0789 Other chest pain: Secondary | ICD-10-CM | POA: Diagnosis not present

## 2023-11-25 DIAGNOSIS — Z79899 Other long term (current) drug therapy: Secondary | ICD-10-CM | POA: Diagnosis not present

## 2023-11-25 DIAGNOSIS — I10 Essential (primary) hypertension: Secondary | ICD-10-CM

## 2023-11-25 DIAGNOSIS — R002 Palpitations: Secondary | ICD-10-CM

## 2023-11-25 MED ORDER — LISINOPRIL 20 MG PO TABS: 20.0000 mg | ORAL_TABLET | Freq: Every day | ORAL | 3 refills | Status: DC

## 2023-11-25 NOTE — Patient Instructions (Signed)
 Medication Instructions:  Your physician recommends the following medication changes.  INCREASE: Lisinopril  20 mg daily   *If you need a refill on your cardiac medications before your next appointment, please call your pharmacy*   Lab Work: Your provider would like for you to return in 1-2 weeks to have the following labs drawn: BMeT.   Please go to [facility] [address] ([facility] [address], [place] [address] You do not need an appointment.  They are open from 8 am- 5 pm.  Lunch from 1:00 pm- 2:00 pm You DO NOT need to be fasting.  If you have labs (blood work) drawn today and your tests are completely normal, you will receive your results only by: MyChart Message (if you have MyChart) OR A paper copy in the mail If you have any lab test that is abnormal or we need to change your treatment, we will call you to review the results.   Testing/Procedures: 24 hour blood pressure monitor has been ordered for you.  You may pick it up at: [address], [place], [place] [address]  They will call you to setup an appointment and show you how to use it. If you have any questions please call our office at [phone].   Follow-Up: At [facility], you and your health needs are our priority.  As part of our continuing mission to provide you with exceptional heart care, we have created designated Provider Care Teams.  These Care Teams include your primary Cardiologist (physician) and Advanced Practice Providers (APPs -  Physician Assistants and Nurse Practitioners) who all work together to provide you with the care you need, when you need it.  We recommend signing up for the patient portal called MyChart.  Sign up information is provided on this After Visit Summary.  MyChart is used to connect with patients for Virtual Visits (Telemedicine).  Patients are able to view lab/test results, encounter notes, upcoming appointments, etc.  Non-urgent messages  can be sent to your provider as well.   To learn more about what you can do with MyChart, go to [email or web address].    Your next appointment:   1 month(s)  Provider:   You may see Lonni Hanson, MD or one of the following Advanced Practice Providers on your designated Care Team:   Bernardino Bring, PA-C

## 2023-12-09 ENCOUNTER — Ambulatory Visit (INDEPENDENT_AMBULATORY_CARE_PROVIDER_SITE_OTHER): Payer: PPO | Admitting: Family Medicine

## 2023-12-09 ENCOUNTER — Encounter: Payer: Self-pay | Admitting: Family Medicine

## 2023-12-09 VITALS — BP 116/72 | HR 64 | Ht 62.0 in | Wt 158.0 lb

## 2023-12-09 DIAGNOSIS — J0140 Acute pansinusitis, unspecified: Secondary | ICD-10-CM | POA: Diagnosis not present

## 2023-12-09 DIAGNOSIS — F331 Major depressive disorder, recurrent, moderate: Secondary | ICD-10-CM

## 2023-12-09 DIAGNOSIS — I10 Essential (primary) hypertension: Secondary | ICD-10-CM | POA: Diagnosis not present

## 2023-12-09 DIAGNOSIS — R002 Palpitations: Secondary | ICD-10-CM

## 2023-12-09 DIAGNOSIS — K5900 Constipation, unspecified: Secondary | ICD-10-CM | POA: Insufficient documentation

## 2023-12-09 DIAGNOSIS — Z91013 Allergy to seafood: Secondary | ICD-10-CM | POA: Diagnosis not present

## 2023-12-09 DIAGNOSIS — K2100 Gastro-esophageal reflux disease with esophagitis, without bleeding: Secondary | ICD-10-CM

## 2023-12-09 MED ORDER — EPINEPHRINE 0.3 MG/0.3ML IJ SOAJ: 0.3000 mg | INTRAMUSCULAR | 1 refills | Status: AC | PRN

## 2023-12-09 MED ORDER — DOCUSATE SODIUM 100 MG PO CAPS: 100.0000 mg | ORAL_CAPSULE | Freq: Two times a day (BID) | ORAL | 3 refills | Status: AC | PRN

## 2023-12-09 MED ORDER — AMOXICILLIN-POT CLAVULANATE 875-125 MG PO TABS: 1.0000 | ORAL_TABLET | Freq: Two times a day (BID) | ORAL | 0 refills | Status: DC

## 2023-12-09 NOTE — Assessment & Plan Note (Signed)
Hx of shell fish allergy Unsure if current Epipen is expired Reordered for pt to have access to up to date prescription.

## 2023-12-09 NOTE — Progress Notes (Signed)
Established Patient Office Visit  Introduced to nurse practitioner role and practice setting.  All questions answered.  Discussed provider/patient relationship and expectations.   Subjective   Patient ID: Melinda Shaw, female    DOB: [birth year]  Age: 58 y.o. MRN: [id number]  Chief Complaint  Patient presents with   Follow-up    Pt stated--need something stronger for constipation-colace    Pt presents for chronic disease mgmt. Was seeing Merita Norton, FNP, today is first visit with new PCP. Accompanied by husband.  Constipation - ran out her stool softener medication, which she finds effective, prevents chronic constipation. She is plans for colonoscopy in March.  She has a history of MDD and PTSD, was managed with lithium, tapered off, and has recently stopped taking lithium and a sleeping medication since end of December. Seen by [name]. The sleeping medication was ineffective, causing her to feel groggy during the day. Her psychiatrist has increased her dose of Topamax  to compensate for discontinuing lithium. She plans to follow up with her psychiatrist in February.  She is currently on lisinopril, which was increased to 20 mg, and Cardizem 240mg   for blood pressure management. Her blood pressure has been fluctuating. She also takes cardizem 30mg  for arrhythmia/palpitations as needed, prescribed by Dr. Okey Dupre, cardiology.   She mentions feeling sinus pressure and headache for the past few days, suggesting a possible sinus infection. She has been using nasal sprays and over-the-counter pain relief to manage the symptoms.       10/24/2023   11:39 AM 10/21/2023   11:20 AM 06/13/2023    2:10 PM  Depression screen PHQ 2/9  Decreased Interest 0 0 0  Down, Depressed, Hopeless 1 1 1   PHQ - 2 Score 1 1 1   Altered sleeping 3 1 1   Tired, decreased energy 2 1 2   Change in appetite 1 0 2  Feeling bad or failure about yourself  0 0 0  Trouble concentrating 3 0 1  Moving slowly or  fidgety/restless 1 0 0  Suicidal thoughts 0 0 0  PHQ-9 Score 11 3 7   Difficult doing work/chores Not difficult at all Not difficult at all Not difficult at all       05/07/2023    1:02 PM 02/18/2023   10:43 AM 02/12/2023    3:17 PM 01/22/2023    1:41 PM  GAD 7 : Generalized Anxiety Score  Nervous, Anxious, on Edge 1 0 1 2  Control/stop worrying 0 0 0 1  Worry too much - different things 1 0 1 1  Trouble relaxing 3 3 1 3   Restless 3 3 1 3   Easily annoyed or irritable 1 3 1 2   Afraid - awful might happen 0 0 1 0  Total GAD 7 Score 9 9 6 12   Anxiety Difficulty Somewhat difficult Not difficult at all       Review of Systems  All other systems reviewed and are negative.   Negative unless indicated in HPI   Objective:     BP 116/72 (BP Location: Left Arm, Patient Position: Sitting, Cuff Size: Normal)   Pulse 64   Ht 5\' 2"  (1.575 m)   Wt 158 lb (71.7 kg)   LMP 09/24/2016 (Approximate)   SpO2 100%   BMI 28.90 kg/m    Physical Exam Constitutional:      General: She is not in acute distress.    Appearance: Normal appearance. She is normal weight. She is ill-appearing. She is not toxic-appearing  or diaphoretic.  HENT:     Head: Normocephalic.     Right Ear: Ear canal and external ear normal. No decreased hearing noted. No drainage or tenderness. A middle ear effusion is present. There is no impacted cerumen. Tympanic membrane is not injected, perforated or erythematous.     Left Ear: Ear canal and external ear normal. No decreased hearing noted. No drainage or tenderness. A middle ear effusion is present. There is no impacted cerumen. Tympanic membrane is not injected, perforated or erythematous.     Ears:     Comments: Mild bulge to bilateral TMs    Nose: Congestion present.     Right Turbinates: Swollen.     Left Turbinates: Swollen.     Right Sinus: Maxillary sinus tenderness and frontal sinus tenderness present.     Left Sinus: Maxillary sinus tenderness and frontal sinus  tenderness present.     Mouth/Throat:     Mouth: Mucous membranes are moist.     Pharynx: Oropharynx is clear. No pharyngeal swelling or oropharyngeal exudate.     Tonsils: No tonsillar exudate or tonsillar abscesses.  Eyes:     General:        Right eye: No discharge.        Left eye: No discharge.     Extraocular Movements: Extraocular movements intact.     Conjunctiva/sclera: Conjunctivae normal.     Pupils: Pupils are equal, round, and reactive to light.  Neck:     Thyroid: No thyroid mass, thyromegaly or thyroid tenderness.  Cardiovascular:     Rate and Rhythm: Normal rate and regular rhythm.     Pulses: Normal pulses.     Heart sounds: Normal heart sounds. No murmur heard.    No friction rub. No gallop.  Pulmonary:     Effort: No respiratory distress.     Breath sounds: No stridor. No wheezing, rhonchi or rales.  Chest:     Chest wall: No tenderness.  Musculoskeletal:     Right lower leg: No edema.     Left lower leg: No edema.  Lymphadenopathy:     Cervical: No cervical adenopathy.  Skin:    General: Skin is warm and dry.     Capillary Refill: Capillary refill takes less than 2 seconds.  Neurological:     General: No focal deficit present.     Mental Status: She is alert and oriented to person, place, and time. Mental status is at baseline.     Cranial Nerves: No cranial nerve deficit.     Sensory: No sensory deficit.     Motor: No weakness.     Coordination: Coordination normal.     Gait: Gait normal.     Deep Tendon Reflexes: Reflexes normal.  Psychiatric:        Mood and Affect: Mood normal.        Behavior: Behavior normal.        Thought Content: Thought content normal.        Judgment: Judgment normal.    No results found for any visits on 12/09/23.    The ASCVD Risk score (Arnett DK, et al., 2019) failed to calculate for the following reasons:   Risk score cannot be calculated because patient has a medical history suggesting prior/existing ASCVD     Assessment & Plan:  Constipation, unspecified constipation type Assessment & Plan: Hx of constipation Needs refill on colace, prefers non stimulating medication Has miralax at home if needed No acute episode today, having  bowel movements normally  Orders: -     Docusate Sodium; Take 1 capsule (100 mg total) by mouth 2 (two) times daily as needed for mild constipation.  Dispense: 180 capsule; Refill: 3  Acute non-recurrent pansinusitis Assessment & Plan: Concerns for frontal and maxillary tenderness for 4 days. Congestion and facial headache Has been taking Flonase and OTC cold/flu meds Bilateral tenderness to sinuses on palpation Bilater TM effusion, mild bulging with yellow fluid behind TM Concerns for developing otitis media secondary to sinus infection  Will prescribe Augmentin BID for 7 days Continue to use Flonase BID for congestion and nasal saline spray May use tylenol as needed for pain mgmt  Hot tea with honey Increase fluids and encourage humidification of face.    Orders: -     Amoxicillin-Pot Clavulanate; Take 1 tablet by mouth 2 (two) times daily.  Dispense: 14 tablet; Refill: 0  Shellfish allergy Assessment & Plan: Hx of shell fish allergy Unsure if current Epipen is expired Reordered for pt to have access to up to date prescription.  Orders: -     EPINEPHrine; Inject 0.3 mg into the muscle as needed for anaphylaxis.  Dispense: 1 each; Refill: 1  Essential hypertension Assessment & Plan: Chronic, stable, controlled Today BP = 116/72 Goal SBP<130; DBP<80 Continue Diltiazem 240mg  daily prescribed by Dr. Okey Dupre Continue Lisinopril 20mg  daily Labs up to date Continue to follow with Cardiology      Gastroesophageal reflux disease with esophagitis without hemorrhage Assessment & Plan: Chronic, stable Wishes to Continue Omeprazole 40mg  daily   Palpitation Assessment & Plan: Continue to follow with Cardiology, Dr. Okey Dupre Prescribed by Dr. Okey Dupre - PRN  Cardizem 30 mg for palpitations   MDD (major depressive disorder), recurrent episode, moderate (HCC) Assessment & Plan: Continues to be managed by [name] for MDD and PTSD On Topamax 125 mg daily Sertraline 50 mg Daily Klonopin 0.5 prn three times weekly Hydroxyzine 25 mg TID as needed Pt wishes to have different provider, asked for PCP to managed medications. It is preferred to continue with psych for med management given complex hx - pt agreeable and willing to ask administration to seek different provider at [facility] clinic     Return in about 4 months (around 04/07/2024).   I, Sallee Provencal, FNP, have reviewed all documentation for this visit. The documentation on 12/09/23 for the exam, diagnosis, procedures, and orders are all accurate and complete.   Sallee Provencal, FNP

## 2023-12-09 NOTE — Assessment & Plan Note (Signed)
Chronic, stable, controlled Today BP = 116/72 Goal SBP<130; DBP<80 Continue Diltiazem 240mg  daily prescribed by Dr. Okey Dupre Continue Lisinopril 20mg  daily Labs up to date Continue to follow with Cardiology

## 2023-12-09 NOTE — Assessment & Plan Note (Signed)
Hx of constipation Needs refill on colace, prefers non stimulating medication Has miralax at home if needed No acute episode today, having bowel movements normally

## 2023-12-09 NOTE — Assessment & Plan Note (Signed)
Continue to follow with Cardiology, Dr. Okey Dupre Prescribed by Dr. Okey Dupre - PRN Cardizem 30 mg for palpitations

## 2023-12-09 NOTE — Assessment & Plan Note (Signed)
Chronic, stable Wishes to Continue Omeprazole 40mg  daily

## 2023-12-09 NOTE — Assessment & Plan Note (Addendum)
Concerns for frontal and maxillary tenderness for 4 days. Congestion and facial headache Has been taking Flonase and OTC cold/flu meds Bilateral tenderness to sinuses on palpation Bilater TM effusion, mild bulging with yellow fluid behind TM Concerns for developing otitis media secondary to sinus infection  Will prescribe Augmentin BID for 7 days Continue to use Flonase BID for congestion and nasal saline spray May use tylenol as needed for pain mgmt  Hot tea with honey Increase fluids and encourage humidification of face.

## 2023-12-09 NOTE — Assessment & Plan Note (Signed)
Continues to be managed by [name] for MDD and PTSD On Topamax 125 mg daily Sertraline 50 mg Daily Klonopin 0.5 prn three times weekly Hydroxyzine 25 mg TID as needed Pt wishes to have different provider, asked for PCP to managed medications. It is preferred to continue with psych for med management given complex hx - pt agreeable and willing to ask administration to seek different provider at [facility] clinic

## 2023-12-10 LAB — BASIC METABOLIC PANEL
BUN/Creatinine Ratio: 17 (ref 9–23)
BUN: 16 mg/dL (ref 6–24)
CO2: 19 mmol/L — ABNORMAL LOW (ref 20–29)
Calcium: 9.8 mg/dL (ref 8.7–10.2)
Chloride: 105 mmol/L (ref 96–106)
Creatinine, Ser: 0.93 mg/dL (ref 0.57–1.00)
Glucose: 103 mg/dL — ABNORMAL HIGH (ref 70–99)
Potassium: 4.9 mmol/L (ref 3.5–5.2)
Sodium: 141 mmol/L (ref 134–144)
eGFR: 72 mL/min/{1.73_m2} (ref >59)

## 2023-12-17 ENCOUNTER — Encounter: Payer: Self-pay | Admitting: Psychiatry

## 2023-12-17 ENCOUNTER — Ambulatory Visit (INDEPENDENT_AMBULATORY_CARE_PROVIDER_SITE_OTHER): Payer: PPO | Admitting: Psychiatry

## 2023-12-17 VITALS — BP 118/78 | HR 86 | Temp 98.2°F | Ht 62.0 in | Wt 157.4 lb

## 2023-12-17 DIAGNOSIS — F4310 Post-traumatic stress disorder, unspecified: Secondary | ICD-10-CM

## 2023-12-17 DIAGNOSIS — F330 Major depressive disorder, recurrent, mild: Secondary | ICD-10-CM | POA: Diagnosis not present

## 2023-12-17 DIAGNOSIS — F429 Obsessive-compulsive disorder, unspecified: Secondary | ICD-10-CM

## 2023-12-17 MED ORDER — TOPIRAMATE 50 MG PO TABS: 125.0000 mg | ORAL_TABLET | Freq: Every day | ORAL | 1 refills | Status: DC

## 2023-12-17 MED ORDER — HYDROXYZINE PAMOATE 25 MG PO CAPS: 25.0000 mg | ORAL_CAPSULE | Freq: Three times a day (TID) | ORAL | 1 refills | Status: AC | PRN

## 2023-12-17 NOTE — Progress Notes (Signed)
 [facility] MD OP Progress Note  12/17/2023 2:03 PM Melinda Shaw  MRN:  [id number]  Chief Complaint:  Chief Complaint  Patient presents with   Follow-up   Depression   Anxiety   Medication Refill   HPI: Melinda Shaw is a 58 year old old female, lives in [place], married, on disability, has a history of PTSD, MDD, OCD, hypertension, COPD, history of CVA on ASA, fibromyalgia, history of appendiceal neoplasm, history of prior ileocecectomy, history of hepatitis was evaluated in office today.  She has been experiencing mood swings since discontinuing lithium  approximately one month ago. The mood swings are noticeable, with fluctuations between feeling okay and not okay, and can occur at any time. Her husband notes she becomes upset over small things more easily than before. She is currently taking sertraline  at a half dose and Topamax  75 mg for mood stabilization.  She has a history of PTSD with ongoing symptoms triggered by noises, sounds, and smells. These symptoms have persisted since the onset of PTSD and have not resolved, although they have lessened over time. She is not currently engaged in therapy, as she felt it was exacerbating her symptoms.  Regarding her sleep, she has been off sleep medication Lunesta  for a while, as it was causing her sleep cycle to reverse, making her feel hungover in the mornings. She is attempting to sleep without medication, experiencing broken sleep but noting some improvement. She typically goes to bed around 10 PM, wakes up at 1 AM, and then again at 3 AM, with a final wake-up at 6 AM.  She is currently on an antibiotic, Augmentin  for a sinus infection that began last week. She experiences sinus infections annually around this time of year.  She reports improvement in her leg pain, which had been severe but is now better with ongoing cortisone injections. She anticipates needing ACL reconstruction surgery in the future but is delaying it until next  winter.  She currently denies any suicidality, homicidality or perceptual disturbances.   Visit Diagnosis:    ICD-10-CM   1. PTSD (post-traumatic stress disorder)  F43.10 topiramate  (TOPAMAX ) 50 MG tablet    2. MDD (major depressive disorder), recurrent episode, mild (HCC)  F33.0 topiramate  (TOPAMAX ) 50 MG tablet    3. Obsessive-compulsive disorder with good or fair insight  F42.9 hydrOXYzine  (VISTARIL ) 25 MG capsule      Past Psychiatric History: I have reviewed past psychiatric history from progress note on 12/06/2022.  Past trials of medications like Seroquel-weight gain, multiple others including lithium , Lunesta , Ambien   Past Medical History:  Past Medical History:  Diagnosis Date   Anemia    H/O   Anxiety    Bronchitis    Chest pain    a. 09/2018 Cath: Nl cors. EF 65%->Med rx (long-acting nitrate added).   Depression    Diastolic dysfunction    a. 10/2018 Echo: EF 55-60%, no rwma, Gr1DD. Mild MR. Nl RV fxn.   DOE (dyspnea on exertion)    Fibromyalgia    GERD (gastroesophageal reflux disease)    Headache    Hyperlipidemia    Hypertension    no meds   Palpitations    a. 01/2019 [facility]): NSR w/ rare PACs & PVCs; b. 04/2020 [facility]: Predominantly sinus rhythm @ 75 (48-141). Rare PACs/PVCs. No significant arrhythmias or prolonged pauses.  No triggered events.   PONV (postoperative nausea and vomiting)     Past Surgical History:  Procedure Laterality Date   ANTERIOR CRUCIATE LIGAMENT REPAIR  CARDIAC CATHETERIZATION     CHOLECYSTECTOMY     CORONARY ANGIOPLASTY     ESOPHAGOGASTRODUODENOSCOPY (EGD) WITH PROPOFOL  N/A 08/28/2015   Procedure: ESOPHAGOGASTRODUODENOSCOPY (EGD) WITH PROPOFOL ;  Surgeon: Donnice Vaughn Manes, MD;  Location: [facility] ENDOSCOPY;  Service: Endoscopy;  Laterality: N/A;   ESOPHAGOGASTRODUODENOSCOPY (EGD) WITH PROPOFOL  N/A 01/31/2022   Procedure: ESOPHAGOGASTRODUODENOSCOPY (EGD) WITH PROPOFOL ;  Surgeon: Therisa Bi, MD;  Location: [facility] ENDOSCOPY;  Service:  Gastroenterology;  Laterality: N/A;   KNEE SURGERY     left knee    LEFT HEART CATH AND CORONARY ANGIOGRAPHY Left 09/22/2018   Procedure: LEFT HEART CATH AND CORONARY ANGIOGRAPHY;  Surgeon: Mady Bruckner, MD;  Location: [facility] INVASIVE CV LAB;  Service: Cardiovascular;  Laterality: Left;   MASS EXCISION Left 01/26/2016   Procedure: EXCISION OF LEFT WRIST VOLAR MASS;  Surgeon: Donnice Robinsons, MD;  Location: [facility];  Service: Orthopedics;  Laterality: Left;   NOSE SURGERY     POLYPECTOMY     TUBAL LIGATION     VIDEO BRONCHOSCOPY N/A 05/29/2015   Procedure: VIDEO BRONCHOSCOPY WITHOUT FLUORO;  Surgeon: Jorie Cha, MD;  Location: [facility] ORS;  Service: Cardiopulmonary;  Laterality: N/A;    Family Psychiatric History: I have reviewed family psychiatric history from progress note on 12/06/2022.  Family History:  Family History  Problem Relation Age of Onset   Dementia Mother    Alcohol abuse Mother    COPD Mother    High Cholesterol Mother    Heart failure Mother    Hypertension Mother    Dementia Father    Alcohol abuse Father    Hypertension Father    AAA (abdominal aortic aneurysm) Sister    COPD Sister    High Cholesterol Sister     Social History: I have reviewed social history from progress note on 12/06/2022. Social History   Socioeconomic History   Marital status: Married    Spouse name: david   Number of children: 3   Years of education: Not on file   Highest education level: 10th grade  Occupational History   Not on file  Tobacco Use   Smoking status: Never   Smokeless tobacco: Never  Vaping Use   Vaping status: Never Used  Substance and Sexual Activity   Alcohol use: No    Alcohol/week: 0.0 standard drinks of alcohol   Drug use: No   Sexual activity: Yes  Other Topics Concern   Not on file  Social History Narrative   Not on file   Social Drivers of Health   Financial Resource Strain: Low Risk  (12/05/2023)   Overall Financial Resource  Strain (CARDIA)    Difficulty of Paying Living Expenses: Not hard at all  Recent Concern: Financial Resource Strain - Medium Risk (10/13/2023)   Received from [facility]   Overall Financial Resource Strain (CARDIA)    Difficulty of Paying Living Expenses: Somewhat hard  Food Insecurity: Food Insecurity Present (12/05/2023)   Hunger Vital Sign    Worried About Running Out of Food in the Last Year: Sometimes true    Ran Out of Food in the Last Year: Sometimes true  Transportation Needs: Unmet Transportation Needs (12/05/2023)   PRAPARE - Transportation    Lack of Transportation (Medical): Yes    Lack of Transportation (Non-Medical): Yes  Physical Activity: Sufficiently Active (12/05/2023)   Exercise Vital Sign    Days of Exercise per Week: 3 days    Minutes of Exercise per Session: 90 min  Stress: Stress  Concern Present (12/05/2023)   [employer] of Occupational Health - Occupational Stress Questionnaire    Feeling of Stress : Rather much  Social Connections: Socially Isolated (12/05/2023)   Social Connection and Isolation Panel [NHANES]    Frequency of Communication with Friends and Family: Never    Frequency of Social Gatherings with Friends and Family: Never    Attends Religious Services: Never    Database Administrator or Organizations: No    Attends Banker Meetings: Never    Marital Status: Married    Allergies:  Allergies  Allergen Reactions   Cherry Swelling    Swelling of lips and throat   Other Shortness Of Breath    Stopped breathing, Swelling of throat, Stopped breathing, Swelling of throat, Stopped breathing, Swelling of throat   Shellfish Allergy Shortness Of Breath   Sulfa Antibiotics Shortness Of Breath    Stopped breathing, Swelling of throat   Bee Venom     Swelling of throat   Imdur  [Isosorbide  Nitrate]     Worsening headaches     Metabolic Disorder Labs: Lab Results  Component Value Date   HGBA1C 5.6 09/05/2022    No results found for: PROLACTIN Lab Results  Component Value Date   CHOL 121 04/18/2023   TRIG 81 04/18/2023   HDL 52 04/18/2023   CHOLHDL 2.3 04/18/2023   VLDL 16 04/18/2023   LDLCALC 53 04/18/2023   Lab Results  Component Value Date   TSH 2.212 04/18/2023   TSH 2.480 09/05/2022    Therapeutic Level Labs: Lab Results  Component Value Date   LITHIUM  0.12 (L) 11/10/2023   LITHIUM  0.58 (L) 10/13/2023   No results found for: VALPROATE No results found for: CBMZ  Current Medications: Current Outpatient Medications  Medication Sig Dispense Refill   albuterol  (VENTOLIN  HFA) 108 (90 Base) MCG/ACT inhaler Inhale 1-2 puffs into the lungs every 6 (six) hours as needed for wheezing or shortness of breath. 18 g 11   amoxicillin -clavulanate (AUGMENTIN ) 875-125 MG tablet Take 1 tablet by mouth 2 (two) times daily. 14 tablet 0   aspirin  EC 81 MG tablet Take 81 mg by mouth daily.     cetirizine  (ZYRTEC ) 10 MG tablet Take 1 tablet (10 mg total) by mouth daily. 30 tablet 11   cyanocobalamin (VITAMIN B12) 1000 MCG/ML injection INJECT 1 ML INTO THE MUSCLE EVERY THIRTY DAYS     diltiazem  (CARDIZEM  CD) 240 MG 24 hr capsule Take 1 capsule (240 mg total) by mouth daily. 90 capsule 3   diltiazem  (CARDIZEM ) 30 MG tablet Take 1 tablet (30 mg total) by mouth daily as needed (palpitations). 90 tablet 0   docusate sodium  (COLACE) 100 MG capsule Take 1 capsule (100 mg total) by mouth 2 (two) times daily as needed for mild constipation. 180 capsule 3   EPINEPHrine  0.3 mg/0.3 mL IJ SOAJ injection Inject 0.3 mg into the muscle as needed for anaphylaxis. 1 each 1   famotidine  (PEPCID ) 20 MG tablet TAKE 1 TABLET BY MOUTH AT BEDTIME 90 tablet 1   gabapentin (NEURONTIN) 400 MG capsule Take 400 mg by mouth daily. prn     lisinopril  (ZESTRIL ) 20 MG tablet Take 1 tablet (20 mg total) by mouth daily. 90 tablet 3   mometasone -formoterol  (DULERA) 200-5 MCG/ACT AERO Inhale into the lungs.     nitroGLYCERIN   (NITROSTAT ) 0.4 MG SL tablet PLACE ONE TABLET UNDER THE TONGUE EVERY 5 MINUTES AS NEEDED FOR CHEST PAIN. MAXIMUM OF 3 DOSES. 25 tablet 3  nortriptyline (PAMELOR) 10 MG capsule Take 10 mg by mouth at bedtime.     omeprazole  (PRILOSEC) 40 MG capsule TAKE 1 CAPSULE BY MOUTH DAILY 90 capsule 1   ondansetron  (ZOFRAN  ODT) 4 MG disintegrating tablet Take 1 tablet (4 mg total) by mouth every 8 (eight) hours as needed for nausea or vomiting. 12 tablet 0   rosuvastatin  (CRESTOR ) 10 MG tablet Take 1 tablet (10 mg total) by mouth at bedtime. 90 tablet 3   sertraline  (ZOLOFT ) 50 MG tablet TAKE 1 TABLET BY MOUTH DAILY WITH BREAKFAST 90 tablet 3   tiZANidine  (ZANAFLEX ) 4 MG tablet Take 4 mg by mouth once.     clonazePAM  (KLONOPIN ) 0.5 MG tablet TAKE 1 TABLET THREE TIMES A WEEK ONLY FOR SEVERE ANXIETY ATTACKS 21 tablet 2   dicyclomine  (BENTYL ) 10 MG capsule Take 1 capsule (10 mg total) by mouth 3 (three) times daily before meals. 90 capsule 2   hydrOXYzine  (VISTARIL ) 25 MG capsule Take 1 capsule (25 mg total) by mouth 3 (three) times daily as needed for anxiety. And sleep 270 capsule 1   potassium chloride  SA (KLOR-CON  M20) 20 MEQ tablet Take 1 tablet (20 mEq total) by mouth 2 (two) times daily. 180 tablet 3   topiramate  (TOPAMAX ) 50 MG tablet Take 2.5 tablets (125 mg total) by mouth daily. 75 tablet 1   No current facility-administered medications for this visit.     Musculoskeletal: Strength & Muscle Tone: within normal limits Gait & Station: normal Patient leans: N/A  Psychiatric Specialty Exam: Review of Systems  Psychiatric/Behavioral:  Positive for sleep disturbance.        Mood swings    Blood pressure 118/78, pulse 86, temperature 98.2 F (36.8 C), temperature source Temporal, height 5' 2 (1.575 m), weight 157 lb 6.4 oz (71.4 kg), last menstrual period 09/24/2016, SpO2 98%.Body mass index is 28.79 kg/m.  General Appearance: Casual  Eye Contact:  Fair  Speech:  Clear and Coherent  Volume:   Normal  Mood:   Mood swings  Affect:  Appropriate  Thought Process:  Goal Directed and Descriptions of Associations: Intact  Orientation:  Full (Time, Place, and Person)  Thought Content: Logical   Suicidal Thoughts:  No  Homicidal Thoughts:  No  Memory:  Immediate;   Fair Recent;   Fair Remote;   Fair  Judgement:  Fair  Insight:  Fair  Psychomotor Activity:  Normal  Concentration:  Concentration: Fair and Attention Span: Fair  Recall:  [employer] of Knowledge: Fair  Language: Fair  Akathisia:  No  Handed:  Right  AIMS (if indicated): not done  Assets:  Desire for Improvement Housing Social Support  ADL's:  Intact  Cognition: WNL  Sleep:   Restless   Screenings: GAD-7    Flowsheet Row Office Visit from 12/17/2023 in [facility] Counselor from 05/07/2023 in [facility] Video Visit from 02/18/2023 in [facility] Counselor from 02/12/2023 in [facility] Office Visit from 01/22/2023 in [facility]  Total GAD-7 Score 13 9 9 6 12       PHQ2-9    Flowsheet Row Office Visit from 12/17/2023 in [facility] Video Visit from 10/24/2023 in [facility] Clinical Support from 10/21/2023 in [facility] Office Visit from 06/13/2023 in [facility] Counselor from 05/07/2023 in [facility]  PHQ-2 Total Score 1 1 1 1  0  PHQ-9 Total Score 10 11 3 7 4       Flowsheet Row Office Visit from 12/17/2023 in [facility] Video Visit from 10/24/2023 in [facility] ED from 10/01/2023 in [facility] Emergency Department at [facility]  C-SSRS RISK CATEGORY No  Risk No Risk No Risk        Assessment and Plan: Melinda Shaw is a 58 year old female on disability, lives in [place], has a history of PTSD, MDD, OCD, currently tapered off of lithium  due to concerns about interaction with lisinopril , presents with recurrence of mood swings, discussed assessment and plan as noted below.  MDD-mild-unstable Mood swings with increased irritability and emotional sensitivity, noted after discontinuing lithium . Plan to increase sertraline  to 50 mg daily after completing antibiotics. - Increase Sertraline  to 50 mg daily, taken as half tablet in the morning and half tablet in the afternoon, starting after completing the antibiotic course and waiting a few days. - Continue Topamax  125 mg daily. - Reassess mood symptoms in 6-7 weeks.  PTSD-chronic with residual symptoms Patient with history of significant trauma with ongoing chronic symptoms.  Patient is not interested in engaging in therapy at this time. - Continue Sertraline  as prescribed - Discuss sleep hygiene techniques, addition of melatonin - Discontinue Lunesta  due to side effects.  OCD-improving Currently denies any significant OCD symptoms. - Continue Hydroxyzine  25 mg 3 times a day as needed. - Continue Clonazepam  0.5 mg a few times a week only for severe panic attacks as needed   Follow-up in clinic in 6 to 7 weeks or sooner if needed.  Collaboration of Care: Collaboration of Care: Patient refused AEB patient declined referral for CBT  Patient/Guardian was advised Release of Information must be obtained prior to any record release in order to collaborate their care with an outside provider. Patient/Guardian was advised if they have not already done so to contact the registration department to sign all necessary forms in order for us  to release information regarding their care.   Consent: Patient/Guardian gives verbal consent for treatment and assignment of benefits for services provided  during this visit. Patient/Guardian expressed understanding and agreed to proceed.   This note was generated in part or whole with voice recognition software. Voice recognition is usually quite accurate but there are transcription errors that can and very often do occur. I apologize for any typographical errors that were not detected and corrected.    Brittanya Winburn, MD 12/19/2023, 7:58 AM

## 2023-12-22 ENCOUNTER — Encounter: Payer: Self-pay | Admitting: Internal Medicine

## 2023-12-24 ENCOUNTER — Encounter: Payer: Self-pay | Admitting: Internal Medicine

## 2023-12-25 NOTE — Progress Notes (Deleted)
 Cardiology Office Note    Date:  12/25/2023   ID:  Melinda [facility], DOB 1966-07-22, MRN [id number]  PCP:  Sallee Provencal, FNP  Cardiologist:  [name], MD  Electrophysiologist:  None   Chief Complaint: Follow-up  History of Present Illness:   Melinda Shaw is a 58 y.o. female with history of ***  She contacted our office noting  ***   Labs independently reviewed: 11/2023 - BUN 16, serum creatinine 0.93, potassium 4.9 09/2023 - Hgb 13.9, PLT 231, albumin 4.2, AST/ALT normal 04/2023 - TC 121, TG 81, HDL 52, LDL 53, magnesium 2.4, TSH normal 08/2022 - A1c 5.6  Past Medical History:  Diagnosis Date   Anemia    H/O   Anxiety    Bronchitis    Chest pain    a. 09/2018 Cath: Nl cors. EF 65%->Med rx (long-acting nitrate added).   Depression    Diastolic dysfunction    a. 10/2018 Echo: EF 55-60%, no rwma, Gr1DD. Mild MR. Nl RV fxn.   DOE (dyspnea on exertion)    Fibromyalgia    GERD (gastroesophageal reflux disease)    Headache    Hyperlipidemia    Hypertension    no meds   Palpitations    a. 01/2019 Zio [facility]): NSR w/ rare PACs & PVCs; b. 04/2020 Zio: Predominantly sinus rhythm @ 75 (48-141). Rare PACs/PVCs. No significant arrhythmias or prolonged pauses.  No triggered events.   PONV (postoperative nausea and vomiting)     Past Surgical History:  Procedure Laterality Date   ANTERIOR CRUCIATE LIGAMENT REPAIR     CARDIAC CATHETERIZATION     CHOLECYSTECTOMY     CORONARY ANGIOPLASTY     ESOPHAGOGASTRODUODENOSCOPY (EGD) WITH PROPOFOL N/A 08/28/2015   Procedure: ESOPHAGOGASTRODUODENOSCOPY (EGD) WITH PROPOFOL;  Surgeon: Elnita Maxwell, MD;  Location: [facility] ENDOSCOPY;  Service: Endoscopy;  Laterality: N/A;   ESOPHAGOGASTRODUODENOSCOPY (EGD) WITH PROPOFOL N/A 01/31/2022   Procedure: ESOPHAGOGASTRODUODENOSCOPY (EGD) WITH PROPOFOL;  Surgeon: Wyline Mood, MD;  Location: [facility] ENDOSCOPY;  Service: Gastroenterology;  Laterality: N/A;   KNEE SURGERY     left knee     LEFT HEART CATH AND CORONARY ANGIOGRAPHY Left 09/22/2018   Procedure: LEFT HEART CATH AND CORONARY ANGIOGRAPHY;  Surgeon: [name], MD;  Location: [facility] INVASIVE CV LAB;  Service: Cardiovascular;  Laterality: Left;   MASS EXCISION Left 01/26/2016   Procedure: EXCISION OF LEFT WRIST VOLAR MASS;  Surgeon: Dairl Ponder, MD;  Location: [facility];  Service: Orthopedics;  Laterality: Left;   NOSE SURGERY     POLYPECTOMY     TUBAL LIGATION     VIDEO BRONCHOSCOPY N/A 05/29/2015   Procedure: VIDEO BRONCHOSCOPY WITHOUT FLUORO;  Surgeon: Stephanie Acre, MD;  Location: [facility] ORS;  Service: Cardiopulmonary;  Laterality: N/A;    Current Medications: No outpatient medications have been marked as taking for the 12/26/23 encounter (Appointment) with Sondra Barges, PA-C.    Allergies:   Cherry, Other, Shellfish allergy, Sulfa antibiotics, Bee venom, and Imdur [isosorbide nitrate]   Social History   Socioeconomic History   Marital status: Married    Spouse name: david   Number of children: 3   Years of education: Not on file   Highest education level: 10th grade  Occupational History   Not on file  Tobacco Use   Smoking status: Never   Smokeless tobacco: Never  Vaping Use   Vaping status: Never Used  Substance and Sexual Activity   Alcohol use: No  Alcohol/week: 0.0 standard drinks of alcohol   Drug use: No   Sexual activity: Yes  Other Topics Concern   Not on file  Social History Narrative   Not on file   Social Drivers of Health   Financial Resource Strain: Low Risk  (12/05/2023)   Overall Financial Resource Strain (CARDIA)    Difficulty of Paying Living Expenses: Not hard at all  Recent Concern: Financial Resource Strain - Medium Risk (10/13/2023)   Received from [facility]   Overall Financial Resource Strain (CARDIA)    Difficulty of Paying Living Expenses: Somewhat hard  Food Insecurity: Food Insecurity Present (12/05/2023)   Hunger Vital  Sign    Worried About Running Out of Food in the Last Year: Sometimes true    Ran Out of Food in the Last Year: Sometimes true  Transportation Needs: Unmet Transportation Needs (12/05/2023)   PRAPARE - Transportation    Lack of Transportation (Medical): Yes    Lack of Transportation (Non-Medical): Yes  Physical Activity: Sufficiently Active (12/05/2023)   Exercise Vital Sign    Days of Exercise per Week: 3 days    Minutes of Exercise per Session: 90 min  Stress: Stress Concern Present (12/05/2023)   [employer] of Occupational Health - Occupational Stress Questionnaire    Feeling of Stress : Rather much  Social Connections: Socially Isolated (12/05/2023)   Social Connection and Isolation Panel [NHANES]    Frequency of Communication with Friends and Family: Never    Frequency of Social Gatherings with Friends and Family: Never    Attends Religious Services: Never    Database administrator or Organizations: No    Attends Engineer, structural: Never    Marital Status: Married     Family History:  The patient's family history includes AAA (abdominal aortic aneurysm) in her sister; Alcohol abuse in her father and mother; COPD in her mother and sister; Dementia in her father and mother; Heart failure in her mother; High Cholesterol in her mother and sister; Hypertension in her father and mother.  ROS:   12-point review of systems is negative unless otherwise noted in the HPI.   EKGs/Labs/Other Studies Reviewed:    Studies reviewed were summarized above. The additional studies were reviewed today:  Zio patch 05/2023:   The patient was monitored for 13 days, 20 hours.   The predominant rhythm was sinus with an average rate of 70 bpm (range 43-154 bpm).   There were rare PACs and PVCs.   two supraventricular runs were observed, lasting up to 5 beats with a maximum rate of 154 bpm.   No sustained arrhythmia or prolonged pauses occurred.   There were 33 patient triggered  events, most of which corresponded to sinus rhythm.  A few triggered events also contained PACs.   Predominantly sinus rhythm with rare PACs and PVCs as well as two brief supraventricular runs.  No significant arrhythmia identified to explain the patient's symptoms. __________   Coronary CTA 01/11/2021: Aorta:  Normal size.  No calcifications.  No dissection.   Aortic Valve:  Trileaflet.  No calcifications.   Coronary Arteries:  Normal coronary origin.  Right dominance.   RCA is a dominant artery that gives rise to PDA and PLA. There is no plaque.   Left main is a large artery that gives rise to LAD and LCX arteries.   LAD has no plaque or evidence for disease.   LCX is a non-dominant artery that gives rise to two  obtuse marginal branches. There is no plaque.   Other findings:   Normal pulmonary vein drainage into the left atrium.   Normal left atrial appendage without a thrombus.   Normal size of the pulmonary artery.   IMPRESSION: 1. Normal coronary calcium score of 0. Patient is low risk for coronary events. 2. Normal coronary origin with right dominance. 3. No evidence of CAD. __________   [place] patch 04/2020: The patient was monitored for 12 days, 23 hours. The predominant rhythm was sinus with an average rate of 75 bpm (range 48-141 bpm). There were rare PAC's and PVC's. No significant arrhythmia or prolonged pause was observed. There were no patient triggered events.   Predominantly sinus rhythm with rare PAC's and PVC's.  No significant arrhythmia noted. __________   2D echo 10/29/2018: - Left ventricle: The cavity size was normal. Systolic function was    normal. The estimated ejection fraction was in the range of 55%    to 60%. Wall motion was normal; there were no regional wall    motion abnormalities. Doppler parameters are consistent with    abnormal left ventricular relaxation (grade 1 diastolic    dysfunction).  - Mitral valve: There was mild  regurgitation.  - Left atrium: The atrium was normal in size.  - Right ventricle: Systolic function was normal.  - Pulmonary arteries: Systolic pressure was within the normal    range.  __________   [facility] 09/22/2018: Conclusions: No angiographically significant coronary artery disease. Normal left ventricular systolic function and filling pressure.   Recommendations:  Continue medical therapy, including propranolol and isosorbide mononitrate in case there is an element of microvascular dysfunction and/or coronary vasospasm. Primary prevention of coronary artery disease.   No indication for antiplatelet therapy at this time.  __________   2D echo 04/17/2015: - Left ventricle: The cavity size was normal. Wall thickness was    normal. Systolic function was normal. The estimated ejection    fraction was in the range of 60% to 65%. Wall motion was normal;    there were no regional wall motion abnormalities. Left    ventricular diastolic function parameters were normal.   Impressions:   - Normal study.    EKG:  EKG is ordered today.  The EKG ordered today demonstrates ***  Recent Labs: 04/18/2023: Magnesium 2.4; TSH 2.212 10/01/2023: Hemoglobin 13.5; Platelets 218 12/09/2023: BUN 16; Creatinine, Ser 0.93; Potassium 4.9; Sodium 141  Recent Lipid Panel    Component Value Date/Time   CHOL 121 04/18/2023 1150   TRIG 81 04/18/2023 1150   HDL 52 04/18/2023 1150   CHOLHDL 2.3 04/18/2023 1150   VLDL 16 04/18/2023 1150   LDLCALC 53 04/18/2023 1150    PHYSICAL EXAM:    VS:  LMP 09/24/2016 (Approximate)   BMI: There is no height or weight on file to calculate BMI.  Physical Exam  Wt Readings from Last 3 Encounters:  12/09/23 158 lb (71.7 kg)  11/25/23 153 lb (69.4 kg)  10/23/23 155 lb 8 oz (70.5 kg)     ASSESSMENT & PLAN:   HTN: Blood pressure  Chest pain and palpitations:   {Are you ordering a CV Procedure (e.g. stress test, cath, DCCV, TEE, etc)?   Press F2         :[id number]}     Disposition: F/u with Dr. Okey Dupre or an APP in ***.   Medication Adjustments/Labs and Tests Ordered: Current medicines are reviewed at length with the patient today.  Concerns regarding medicines are outlined  above. Medication changes, Labs and Tests ordered today are summarized above and listed in the Patient Instructions accessible in Encounters.   Signed, Eula Listen, PA-C 12/25/2023 12:48 PM     [facility] [place] [address] [place], [place] [address] [phone]

## 2023-12-26 ENCOUNTER — Ambulatory Visit: Payer: PPO | Admitting: Physician Assistant

## 2024-01-05 ENCOUNTER — Ambulatory Visit: Payer: PPO | Admitting: Physician Assistant

## 2024-01-12 NOTE — Progress Notes (Unsigned)
 Cardiology Clinic Note   Date: 01/13/2024 ID: [name], DOB [birth year], MRN [id number]  Primary Cardiologist:  Melinda Kendall, MD  Chief Complaint   Melinda Shaw is a 58 y.o. female who presents to the clinic today for evaluation of dizziness.   Patient Profile   Melinda Shaw is followed by Dr. Okey Shaw for the history outlined below.      Past medical history significant for: Chest pain. Coronary CTA 01/11/2021: Coronary calcium score of 0 with no evidence of CAD. Dyspnea. Echo 10/29/2018: EF 55 to 60%.  No RWMA.  Grade I DD.  Mild MR.  Normal RV size/function.  Normal PA pressure. Palpitations.  14-day ZIO 06/12/2023: HR 43-154 bpm, average 70 bpm. Predominantly sinus rhythm. Rare PACs and PVCs. 2 runs of SVT, lasting up to 5 beats with max rate 154 bpm. No sustained arrhythmia or prolonged pauses. 33 triggered events  most corresponding with sinus rhythm and few with PACs.  Hypertension.  Hyperlipidemia. Lipid panel 04/18/2023: LDL 53, HDL 52, TG 81, total 121. Fibromyalgia. GERD.  In summary, patient was seen by Dr. Mariah Shaw in 2016 with complaints of dyspnea.  Echo showed EF 60 to 65%, no RWMA, normal diastolic function.  LHC November 2019 showed no angiographically significant CAD with normal LV systolic function and filling pressure.  Echo December 2019 showed EF 55 to 60% as detailed above.  ZIO monitor in June 2021 showed predominantly sinus rhythm with average rate 75 bpm, rare PACs/PVCs.  Coronary CT March 2022 showed calcium score 0 and no evidence of CAD.  Repeat ZIO monitoring August 2024 Showed Predominantly Sinus Rhythm As Detailed above.  She has had intermittent concerns of elevated BP.  Patient was last seen in the office by Melinda Listen, PA-C on 11/25/2023 for routine follow-up.  She reported home SBP 150s to 160s.  She reported adherence to diltiazem and lisinopril and restricting sodium.  Lisinopril was increased.  Patient contacted the office through MyChart on  12/22/2023 with complaints of episodes of hypotension with BP as low as 87/59 and increased dizzy spells.  She was instructed to increase her hydration and await instructions from provider.  Patient was instructed to hold lisinopril and not take Cardizem if SBP <110.     History of Present Illness    Today, patient is accompanied by her husband.  She reports increased episodes of dizziness despite stopping lisinopril.  She describes dizziness as "I am spinning 1 way and everything else is spinning the other."  She reports an episode of syncope on 12/25/2023.  She states that she was busy doing her household chores that day and walked to the sink to wash dishes and found herself on the floor.  When asked about symptoms prior to episode she states she felt dizzy and knew she was "going down."  She has had no further syncopal episodes since then.  She does not experience dizziness every day.  She does not have positional dizziness.  She denies any further hypotensive episodes since stopping lisinopril.  She did not bring her home BP cuff (wrist cuff) with her today. She also describes palpitations that occur with dizzy episodes. She describes palpitations as a fluttering "butterfly" sensation in her chest. Sometimes episodes are brief and other times somewhat continuous for longer period of time. Patient reports she was told when she was seen at [facility] that she has afib. I cannot find any documentation supporting this. She denies chest pain.    ROS: All other  systems reviewed and are otherwise negative except as noted in History of Present Illness.  EKGs/Labs Reviewed       No EKG ordered today.  12/09/2023: BUN 16; Creatinine, Ser 0.93; Potassium 4.9; Sodium 141   10/01/2023: Hemoglobin 13.5; WBC 6.3   04/18/2023: TSH 2.212    Physical Exam    VS:  BP 136/82 (BP Location: Left Arm, Patient Position: Sitting, Cuff Size: Normal)   Pulse 72   Ht 5' (1.524 m)   Wt 158 lb (71.7 kg)   LMP 09/24/2016  (Approximate)   SpO2 99%   BMI 30.86 kg/m  , BMI Body mass index is 30.86 kg/m.  GEN: Well nourished, well developed, in no acute distress. Neck: No JVD or carotid bruits. Cardiac:  RRR. No murmurs. No rubs or gallops.   Respiratory:  Respirations regular and unlabored. Clear to auscultation without rales, wheezing or rhonchi. GI: Soft, nontender, nondistended. Extremities: Radials/DP/PT 2+ and equal bilaterally. No clubbing or cyanosis. No edema.  Skin: Warm and dry, no rash. Neuro: Strength intact.  Assessment & Plan   Dizziness/hypotension/syncope Patient reports continued episodes of dizziness described as "I am spinning when laying everything else spinning the other."  She has not had hypotension since she stopped lisinopril but her dizziness episodes have not improved.  She reports it does not happen every day.  She does not have positional dizziness or hypotension.  She reports 1 episode of syncope on 12/25/2023 (occurring after she stopped lisinopril) where she got hot and felt like she was going to pass out and then woke up on the ground.  She believes she was out for just a few seconds.  No syncopal episodes since.  She has not seen her PCP regarding these dizzy episodes. -CBC, BMP, Mg, TSH today. -Carotid ultrasound. -Follow-up with PCP for possible vertigo.  Palpitations 14-day ZIO August 2024 showed heart rate 43 to 154 bpm, average 70 bpm, predominantly sinus rhythm, 2 brief runs of SVT, rare ectopy.  Patient reports having palpitations with dizzy episodes.  She reports at one point when she was being seen at [facility] she was told she has A-fib.  I cannot find any reports of this. RRR on exam today.  -Will hold off on repeat outpatient monitoring at this point.  Hypertension BP today 136/82.  She will continue monitoring BP at home.  With her episodes of dizziness I hesitate making any changes to medications at this point.  She is in agreement with this plan. -Continue  diltiazem.  Hyperlipidemia LDL June 2024 53, at goal. -Continue rosuvastatin.  Disposition: CBC, BMP, Mg, TSH today.  Carotid ultrasound.  Follow-up with PCP for possible vertigo.  Return in 6 to 8 weeks or sooner as needed.         Signed, Etta Grandchild. Edin Kon, DNP, NP-C

## 2024-01-13 ENCOUNTER — Ambulatory Visit: Payer: PPO | Attending: Student | Admitting: Student

## 2024-01-13 ENCOUNTER — Encounter: Payer: Self-pay | Admitting: Student

## 2024-01-13 VITALS — BP 136/82 | HR 72 | Ht 60.0 in | Wt 158.0 lb

## 2024-01-13 DIAGNOSIS — E7800 Pure hypercholesterolemia, unspecified: Secondary | ICD-10-CM | POA: Diagnosis not present

## 2024-01-13 DIAGNOSIS — R42 Dizziness and giddiness: Secondary | ICD-10-CM | POA: Diagnosis not present

## 2024-01-13 DIAGNOSIS — R002 Palpitations: Secondary | ICD-10-CM

## 2024-01-13 DIAGNOSIS — I10 Essential (primary) hypertension: Secondary | ICD-10-CM

## 2024-01-13 NOTE — Patient Instructions (Signed)
 Medication Instructions:  Your Physician recommend you continue on your current medication as directed.    *If you need a refill on your cardiac medications before your next appointment, please call your pharmacy*   Lab Work: Your provider would like for you to have following labs drawn today CBC, BMeT, Magnesium and TSH.   If you have labs (blood work) drawn today and your tests are completely normal, you will receive your results only by: MyChart Message (if you have MyChart) OR A paper copy in the mail If you have any lab test that is abnormal or we need to change your treatment, we will call you to review the results.   Testing/Procedures: Your physician has requested that you have a carotid duplex. This test is an ultrasound of the carotid arteries in your neck. It looks at blood flow through these arteries that supply the brain with blood.   Allow one hour for this exam.  There are no restrictions or special instructions.  This will take place at [address] [facility] [address] [facility]) [address], [place] [address]  Please note: We ask at that you not bring children with you during ultrasound (echo/ vascular) testing. Due to room size and safety concerns, children are not allowed in the ultrasound rooms during exams. Our front office staff cannot provide observation of children in our lobby area while testing is being conducted. An adult accompanying a patient to their appointment will only be allowed in the ultrasound room at the discretion of the ultrasound technician under special circumstances. We apologize for any inconvenience.    Follow-Up: At [facility], you and your health needs are our priority.  As part of our continuing mission to provide you with exceptional heart care, we have created designated Provider Care Teams.  These Care Teams include your primary Cardiologist (physician) and Advanced Practice Providers (APPs -  Physician Assistants and Nurse  Practitioners) who all work together to provide you with the care you need, when you need it.  We recommend signing up for the patient portal called "MyChart".  Sign up information is provided on this After Visit Summary.  MyChart is used to connect with patients for Virtual Visits (Telemedicine).  Patients are able to view lab/test results, encounter notes, upcoming appointments, etc.  Non-urgent messages can be sent to your provider as well.   To learn more about what you can do with MyChart, go to [email or web address].    Your next appointment:   6-8 week(s)  Provider:   Eula Listen, PA-C or Carlos Levering, NP    Alycia Rossetti first then DW)

## 2024-01-14 LAB — BASIC METABOLIC PANEL
BUN/Creatinine Ratio: 15 (ref 9–23)
BUN: 13 mg/dL (ref 6–24)
CO2: 24 mmol/L (ref 20–29)
Calcium: 9.9 mg/dL (ref 8.7–10.2)
Chloride: 105 mmol/L (ref 96–106)
Creatinine, Ser: 0.84 mg/dL (ref 0.57–1.00)
Glucose: 96 mg/dL (ref 70–99)
Potassium: 4.4 mmol/L (ref 3.5–5.2)
Sodium: 140 mmol/L (ref 134–144)
eGFR: 80 mL/min/{1.73_m2} (ref >59)

## 2024-01-14 LAB — MAGNESIUM: Magnesium: 2.3 mg/dL (ref 1.6–2.3)

## 2024-01-14 LAB — TSH: TSH: 1.81 u[IU]/mL (ref 0.450–4.500)

## 2024-01-14 LAB — CBC
Hematocrit: 41.3 % (ref 34.0–46.6)
Hemoglobin: 14 g/dL (ref 11.1–15.9)
MCH: 31.5 pg (ref 26.6–33.0)
MCHC: 33.9 g/dL (ref 31.5–35.7)
MCV: 93 fL (ref 79–97)
Platelets: 238 10*3/uL (ref 150–450)
RBC: 4.45 x10E6/uL (ref 3.77–5.28)
RDW: 12.1 % (ref 11.7–15.4)
WBC: 5.6 10*3/uL (ref 3.4–10.8)

## 2024-01-20 ENCOUNTER — Encounter: Payer: Self-pay | Admitting: Physician Assistant

## 2024-01-20 NOTE — Progress Notes (Deleted)
 Celso Amy, PA-C [address]  [address]  [place], [place] [address]  Main: [phone]  Fax: [phone]   Primary Care Physician: Sallee Provencal, FNP  Primary Gastroenterologist:  Celso Amy, PA-C / Dr. Wyline Mood    CC: F/U IBS-C  HPI: Melinda Shaw is a 58 y.o. female  History of chronic abdominal pain, bloating, constipation, and IBS for many years.  1 month ago she was switched from Linzess 290 to Amitiza 24 mcg twice daily.  Also added lactulose 30 mill twice daily.  She saw hepatologist, Dr. Raford Pitcher at [facility], for NAFLD 03/28/2023.  It was recommended that she switch from lactulose to MiraLAX.  She does not have cirrhosis.  She experienced increased abdominal cramping and bloating.  Discontinued Amitiza and was switched to Trulance 3 mg 1 capsule daily.  Also started dicyclomine 10 mg 3 times daily as needed.   Last EGD 01/2022 by Dr. Tobi Bastos was normal.  No evidence of esophageal varices or stricture.  Repeat EGD recommended in 3 years to screen for varices.   Colonoscopy 03/2021 (done at [facility]) showed  prior end-to-side ileocolonic anastomosis in the ascending colon diverticulosis and 2 subcentimeter polyps were resected.  Normal biopsies of the colon and the polyp was a tubular adenoma. Repeat in 5 years.   MRI /MRCP abdomen with and without contrast 02/2022 showed no liver masses.  Previous cholecystectomy.  Normal pancreas.  Current Outpatient Medications  Medication Sig Dispense Refill   albuterol (VENTOLIN HFA) 108 (90 Base) MCG/ACT inhaler Inhale 1-2 puffs into the lungs every 6 (six) hours as needed for wheezing or shortness of breath. 18 g 11   amoxicillin-clavulanate (AUGMENTIN) 875-125 MG tablet Take 1 tablet by mouth 2 (two) times daily. 14 tablet 0   aspirin EC 81 MG tablet Take 81 mg by mouth daily.     cetirizine (ZYRTEC) 10 MG tablet Take 1 tablet (10 mg total) by mouth daily. 30 tablet 11   clonazePAM (KLONOPIN) 0.5 MG tablet TAKE 1 TABLET THREE TIMES  A WEEK ONLY FOR SEVERE ANXIETY ATTACKS (Patient not taking: Reported on 01/13/2024) 21 tablet 2   cyanocobalamin (VITAMIN B12) 1000 MCG/ML injection INJECT 1 ML INTO THE MUSCLE EVERY THIRTY DAYS     dicyclomine (BENTYL) 10 MG capsule Take 1 capsule (10 mg total) by mouth 3 (three) times daily before meals. 90 capsule 2   diltiazem (CARDIZEM CD) 240 MG 24 hr capsule Take 1 capsule (240 mg total) by mouth daily. 90 capsule 3   diltiazem (CARDIZEM) 30 MG tablet Take 1 tablet (30 mg total) by mouth daily as needed (palpitations). 90 tablet 0   docusate sodium (COLACE) 100 MG capsule Take 1 capsule (100 mg total) by mouth 2 (two) times daily as needed for mild constipation. 180 capsule 3   EPINEPHrine 0.3 mg/0.3 mL IJ SOAJ injection Inject 0.3 mg into the muscle as needed for anaphylaxis. 1 each 1   famotidine (PEPCID) 20 MG tablet TAKE 1 TABLET BY MOUTH AT BEDTIME 90 tablet 1   gabapentin (NEURONTIN) 400 MG capsule Take 400 mg by mouth daily. prn     hydrOXYzine (VISTARIL) 25 MG capsule Take 1 capsule (25 mg total) by mouth 3 (three) times daily as needed for anxiety. And sleep 270 capsule 1   LINZESS 290 MCG CAPS capsule Take 290 mcg by mouth daily.     lisinopril (ZESTRIL) 20 MG tablet Take 1 tablet (20 mg total) by mouth daily. 90 tablet 3  mometasone-formoterol (DULERA) 200-5 MCG/ACT AERO Inhale into the lungs.     nitroGLYCERIN (NITROSTAT) 0.4 MG SL tablet PLACE ONE TABLET UNDER THE TONGUE EVERY 5 MINUTES AS NEEDED FOR CHEST PAIN. MAXIMUM OF 3 DOSES. 25 tablet 3   omeprazole (PRILOSEC) 40 MG capsule TAKE 1 CAPSULE BY MOUTH DAILY 90 capsule 1   ondansetron (ZOFRAN ODT) 4 MG disintegrating tablet Take 1 tablet (4 mg total) by mouth every 8 (eight) hours as needed for nausea or vomiting. 12 tablet 0   ondansetron (ZOFRAN) 4 MG tablet Take 1 tablet by mouth every 8 (eight) hours as needed.     potassium chloride SA (KLOR-CON M20) 20 MEQ tablet Take 1 tablet (20 mEq total) by mouth 2 (two) times daily.  180 tablet 3   rosuvastatin (CRESTOR) 10 MG tablet Take 1 tablet (10 mg total) by mouth at bedtime. 90 tablet 3   sertraline (ZOLOFT) 50 MG tablet TAKE 1 TABLET BY MOUTH DAILY WITH BREAKFAST 90 tablet 3   tiZANidine (ZANAFLEX) 4 MG tablet Take 4 mg by mouth once.     topiramate (TOPAMAX) 50 MG tablet Take 2.5 tablets (125 mg total) by mouth daily. 75 tablet 1   No current facility-administered medications for this visit.    Allergies as of 01/21/2024 - Review Complete 01/13/2024  Allergen Reaction Noted   Cherry Swelling 02/22/2015   Other Shortness Of Breath 05/12/2014   Shellfish allergy Shortness Of Breath 05/25/2015   Sulfa antibiotics Shortness Of Breath 02/22/2015   Bee venom  02/22/2015   Imdur [isosorbide nitrate]  12/31/2019    Past Medical History:  Diagnosis Date   Anemia    H/O   Anxiety    Bronchitis    Chest pain    a. 09/2018 Cath: Nl cors. EF 65%->Med rx (long-acting nitrate added).   Depression    Diastolic dysfunction    a. 10/2018 Echo: EF 55-60%, no rwma, Gr1DD. Mild MR. Nl RV fxn.   DOE (dyspnea on exertion)    Fibromyalgia    GERD (gastroesophageal reflux disease)    Headache    Hyperlipidemia    Hypertension    no meds   Palpitations    a. 01/2019 [facility]): NSR w/ rare PACs & PVCs; b. 04/2020 [facility]: Predominantly sinus rhythm @ 75 (48-141). Rare PACs/PVCs. No significant arrhythmias or prolonged pauses.  No triggered events.   PONV (postoperative nausea and vomiting)     Past Surgical History:  Procedure Laterality Date   ANTERIOR CRUCIATE LIGAMENT REPAIR     CARDIAC CATHETERIZATION     CHOLECYSTECTOMY     CORONARY ANGIOPLASTY     ESOPHAGOGASTRODUODENOSCOPY (EGD) WITH PROPOFOL N/A 08/28/2015   Procedure: ESOPHAGOGASTRODUODENOSCOPY (EGD) WITH PROPOFOL;  Surgeon: Elnita Maxwell, MD;  Location: [facility] ENDOSCOPY;  Service: Endoscopy;  Laterality: N/A;   ESOPHAGOGASTRODUODENOSCOPY (EGD) WITH PROPOFOL N/A 01/31/2022   Procedure:  ESOPHAGOGASTRODUODENOSCOPY (EGD) WITH PROPOFOL;  Surgeon: Wyline Mood, MD;  Location: [facility] ENDOSCOPY;  Service: Gastroenterology;  Laterality: N/A;   KNEE SURGERY     left knee    LEFT HEART CATH AND CORONARY ANGIOGRAPHY Left 09/22/2018   Procedure: LEFT HEART CATH AND CORONARY ANGIOGRAPHY;  Surgeon: [name], MD;  Location: [facility] INVASIVE CV LAB;  Service: Cardiovascular;  Laterality: Left;   MASS EXCISION Left 01/26/2016   Procedure: EXCISION OF LEFT WRIST VOLAR MASS;  Surgeon: Dairl Ponder, MD;  Location: [facility];  Service: Orthopedics;  Laterality: Left;   NOSE SURGERY     POLYPECTOMY  TUBAL LIGATION     VIDEO BRONCHOSCOPY N/A 05/29/2015   Procedure: VIDEO BRONCHOSCOPY WITHOUT FLUORO;  Surgeon: Stephanie Acre, MD;  Location: [facility] ORS;  Service: Cardiopulmonary;  Laterality: N/A;    Review of Systems:    All systems reviewed and negative except where noted in HPI.   Physical Examination:   LMP 09/24/2016 (Approximate)   General: Well-nourished, well-developed in no acute distress.  Lungs: Clear to auscultation bilaterally. Non-labored. Heart: Regular rate and rhythm, no murmurs rubs or gallops.  Abdomen: Bowel sounds are normal; Abdomen is Soft; No hepatosplenomegaly, masses or hernias;  No Abdominal Tenderness; No guarding or rebound tenderness. Neuro: Alert and oriented x 3.  Grossly intact.  Psych: Alert and cooperative, normal mood and affect.   Imaging Studies: No results found.  Assessment and Plan:   Melinda Shaw is a 58 y.o. y/o female who returns for follow-up of irritable bowel syndrome with chronic constipation.  She is currently doing well.  Greatly improved on Linzess and dicyclomine.   Irritable bowel syndrome with chronic constipation             Continue Linzess daily.             Continue Dicyclomine 10 mg 3 times daily as needed.             Low FODMAP diet.             Patient education handout given and  discussed.   NAFLD without cirrhosis             Stable.  She is followed by Dr. Raford Pitcher at [facility] every 6 months.             Continue low-fat diet, exercise, and weight loss.    Celso Amy, PA-C  Follow up ***  BP check ***

## 2024-01-21 ENCOUNTER — Encounter: Payer: Self-pay | Admitting: Physician Assistant

## 2024-01-21 ENCOUNTER — Ambulatory Visit: Admitting: Physician Assistant

## 2024-01-21 ENCOUNTER — Other Ambulatory Visit: Payer: Self-pay

## 2024-01-21 DIAGNOSIS — K589 Irritable bowel syndrome without diarrhea: Secondary | ICD-10-CM

## 2024-01-21 MED ORDER — LINZESS 290 MCG PO CAPS: 290.0000 ug | ORAL_CAPSULE | Freq: Every day | ORAL | 5 refills | Status: AC

## 2024-01-21 MED ORDER — DICYCLOMINE HCL 10 MG PO CAPS: 10.0000 mg | ORAL_CAPSULE | Freq: Three times a day (TID) | ORAL | 5 refills | Status: AC

## 2024-01-26 ENCOUNTER — Telehealth: Payer: Self-pay | Admitting: Family Medicine

## 2024-01-26 NOTE — Telephone Encounter (Signed)
 [facility] faxed refill request for the following medications:   busPIRone (BUSPAR) 10 MG tablet    Please advise.

## 2024-01-26 NOTE — Progress Notes (Unsigned)
 Celso Amy, PA-C [address]  [address]  [place], [place] [address]  Main: [phone]  Fax: [phone]   Primary Care Physician: Sallee Provencal, FNP  Primary Gastroenterologist:  Celso Amy, PA-C / Dr. Wyline Mood    CC: Follow-up IBS-C, chronic constipation, GERD  HPI: Melinda Shaw is a 58 y.o. female returns for 76-month follow-up of irritable bowel syndrome, constipation predominant.  She has had chronic abdominal pain, bloating, constipation for many years.  Currently taking dicyclomine 10 Mg 3 times daily, and Linzess 290 mcg once daily.  Also takes Colace stool softener and Pepcid as needed.  Omeprazole 40 Mg once daily for GERD.  Zofran as needed nausea.  GI symptoms are stable and improved on current treatment.  She has occasional breakthrough abdominal cramping, but no other GI symptoms or concerns.  Needs medication refills.  09/2023 she had follow-up office visit with Dr. 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    01/15/2024 Colonoscopy by Dr. Ruffin Frederick (history of colon polyps): Showed no polyps.  5-year repeat.  01/2022 EGD by Dr. Tobi Bastos was normal.  No evidence of esophageal varices or stricture.  Repeat EGD recommended in 3 years to screen for varices.   Colonoscopy 03/2021 (done at [facility]) showed  prior end-to-side ileocolonic anastomosis in the ascending colon diverticulosis and 2 subcentimeter polyps were resected.  Normal biopsies of the colon and the polyp was a tubular adenoma. Repeat in 5 years.   02/2022 MRI /MRCP abdomen with and without contrast showed no liver masses.  Previous cholecystectomy.  Normal pancreas.  PMHx of obesity, HTN, COPD, hx of CVA on ASA, fibromyalgia, prior ileocecectomy for removal of appendiceal neoplasm, anxiety and depression.  Current Outpatient Medications  Medication Sig Dispense Refill   aspirin EC 81 MG tablet Take 81 mg by mouth daily.     cyanocobalamin (VITAMIN B12) 1000 MCG/ML injection INJECT 1  ML INTO THE MUSCLE EVERY THIRTY DAYS     dicyclomine (BENTYL) 10 MG capsule Take 1 capsule (10 mg total) by mouth 3 (three) times daily before meals. 90 capsule 5   diltiazem (CARDIZEM CD) 240 MG 24 hr capsule Take 1 capsule (240 mg total) by mouth daily. 90 capsule 3   diltiazem (CARDIZEM) 30 MG tablet Take 1 tablet (30 mg total) by mouth daily as needed (palpitations). 90 tablet 0   docusate sodium (COLACE) 100 MG capsule Take 1 capsule (100 mg total) by mouth 2 (two) times daily as needed for mild constipation. 180 capsule 3   EPINEPHrine 0.3 mg/0.3 mL IJ SOAJ injection Inject 0.3 mg into the muscle as needed for anaphylaxis. 1 each 1   famotidine (PEPCID) 20 MG tablet TAKE 1 TABLET BY MOUTH AT BEDTIME 90 tablet 1   hydrOXYzine (VISTARIL) 25 MG capsule Take 1 capsule (25 mg total) by mouth 3 (three) times daily as needed for anxiety. And sleep 270 capsule 1   LINZESS 290 MCG CAPS capsule Take 1 capsule (290 mcg total) by mouth daily. 30 capsule 5   nitroGLYCERIN (NITROSTAT) 0.4 MG SL tablet PLACE ONE TABLET UNDER THE TONGUE EVERY 5 MINUTES AS NEEDED FOR CHEST PAIN. MAXIMUM OF 3 DOSES. 25 tablet 3   omeprazole (PRILOSEC) 40 MG capsule TAKE 1 CAPSULE BY MOUTH DAILY 90 capsule 1   ondansetron (ZOFRAN ODT) 4 MG disintegrating tablet Take 1 tablet (4 mg total) by mouth every 8 (eight) hours as needed for nausea or vomiting. 12 tablet 0  ondansetron (ZOFRAN) 4 MG tablet Take 1 tablet by mouth every 8 (eight) hours as needed.     rosuvastatin (CRESTOR) 10 MG tablet Take 1 tablet (10 mg total) by mouth at bedtime. 90 tablet 3   tiZANidine (ZANAFLEX) 4 MG tablet Take 4 mg by mouth once.     topiramate (TOPAMAX) 50 MG tablet Take 2.5 tablets (125 mg total) by mouth daily. 75 tablet 1   potassium chloride SA (KLOR-CON M20) 20 MEQ tablet Take 1 tablet (20 mEq total) by mouth 2 (two) times daily. 180 tablet 3   No current facility-administered medications for this visit.    Allergies as of 01/27/2024 -  Review Complete 01/27/2024  Allergen Reaction Noted   Cherry Swelling 02/22/2015   Other Shortness Of Breath 05/12/2014   Shellfish allergy Shortness Of Breath 05/25/2015   Sulfa antibiotics Shortness Of Breath 02/22/2015   Bee venom  02/22/2015   Imdur [isosorbide nitrate]  12/31/2019    Past Medical History:  Diagnosis Date   Anemia    H/O   Anxiety    Bronchitis    Chest pain    a. 09/2018 Cath: Nl cors. EF 65%->Med rx (long-acting nitrate added).   Depression    Diastolic dysfunction    a. 10/2018 Echo: EF 55-60%, no rwma, Gr1DD. Mild MR. Nl RV fxn.   DOE (dyspnea on exertion)    Fibromyalgia    GERD (gastroesophageal reflux disease)    Headache    Hyperlipidemia    Hypertension    no meds   Palpitations    a. 01/2019 [facility]): NSR w/ rare PACs & PVCs; b. 04/2020 [facility]: Predominantly sinus rhythm @ 75 (48-141). Rare PACs/PVCs. No significant arrhythmias or prolonged pauses.  No triggered events.   PONV (postoperative nausea and vomiting)     Past Surgical History:  Procedure Laterality Date   ANTERIOR CRUCIATE LIGAMENT REPAIR     CARDIAC CATHETERIZATION     CHOLECYSTECTOMY     CORONARY ANGIOPLASTY     ESOPHAGOGASTRODUODENOSCOPY (EGD) WITH PROPOFOL N/A 08/28/2015   Procedure: ESOPHAGOGASTRODUODENOSCOPY (EGD) WITH PROPOFOL;  Surgeon: Elnita Maxwell, MD;  Location: [facility] ENDOSCOPY;  Service: Endoscopy;  Laterality: N/A;   ESOPHAGOGASTRODUODENOSCOPY (EGD) WITH PROPOFOL N/A 01/31/2022   Procedure: ESOPHAGOGASTRODUODENOSCOPY (EGD) WITH PROPOFOL;  Surgeon: Wyline Mood, MD;  Location: [facility] ENDOSCOPY;  Service: Gastroenterology;  Laterality: N/A;   KNEE SURGERY     left knee    LEFT HEART CATH AND CORONARY ANGIOGRAPHY Left 09/22/2018   Procedure: LEFT HEART CATH AND CORONARY ANGIOGRAPHY;  Surgeon: [name], MD;  Location: [facility] INVASIVE CV LAB;  Service: Cardiovascular;  Laterality: Left;   MASS EXCISION Left 01/26/2016   Procedure: EXCISION OF LEFT WRIST VOLAR MASS;   Surgeon: Dairl Ponder, MD;  Location: [facility];  Service: Orthopedics;  Laterality: Left;   NOSE SURGERY     POLYPECTOMY     TUBAL LIGATION     VIDEO BRONCHOSCOPY N/A 05/29/2015   Procedure: VIDEO BRONCHOSCOPY WITHOUT FLUORO;  Surgeon: Stephanie Acre, MD;  Location: [facility] ORS;  Service: Cardiopulmonary;  Laterality: N/A;    Review of Systems:    All systems reviewed and negative except where noted in HPI.   Physical Examination:   BP 123/74   Pulse 73   Temp 98.3 F (36.8 C)   Ht 5\' 2"  (1.575 m)   Wt 161 lb (73 kg)   LMP 09/24/2016 (Approximate)   BMI 29.45 kg/m   General: Well-nourished, well-developed in no acute distress.  Neuro: Alert and oriented x 3.  Grossly intact.  Psych: Alert and cooperative, normal mood and affect.   Imaging Studies: No results found.  Assessment and Plan:   GIONNA POLAK is a 58 y.o. y/o female returns for 3-month follow-up of:  1.  Irritable bowel syndrome, constipation predominant  Continue Linzess 290 mcg daily  Continue dicyclomine 10 Mg 3 times daily as needed  Try OTC IB Gard 2 capsules twice daily - Samples given.  Low FODMAP diet given.  2.  GERD  Continue omeprazole 40 Mg daily  Continue Pepcid 20 Mg twice daily as needed  3.  MASLD  Followed by Dr. Ruffin Frederick at [facility] liver clinic.  Continue low-fat diet and regular exercise.  4.  History of adenomatous colon polyps  5-year repeat colonoscopy will be due 01/2029.  Celso Amy, PA-C  Follow up in 1 year for IBS-C or As Needed.

## 2024-01-26 NOTE — Telephone Encounter (Signed)
 Not on current medication list. Not prescribed by this provider. Pt see's [name] for behavioral health.

## 2024-01-27 ENCOUNTER — Ambulatory Visit: Admitting: Physician Assistant

## 2024-01-27 ENCOUNTER — Encounter: Payer: Self-pay | Admitting: Physician Assistant

## 2024-01-27 VITALS — BP 123/74 | HR 73 | Temp 98.3°F | Ht 62.0 in | Wt 161.0 lb

## 2024-01-27 DIAGNOSIS — K760 Fatty (change of) liver, not elsewhere classified: Secondary | ICD-10-CM | POA: Diagnosis not present

## 2024-01-27 DIAGNOSIS — Z860101 Personal history of adenomatous and serrated colon polyps: Secondary | ICD-10-CM | POA: Diagnosis not present

## 2024-01-27 DIAGNOSIS — K581 Irritable bowel syndrome with constipation: Secondary | ICD-10-CM | POA: Diagnosis not present

## 2024-01-27 DIAGNOSIS — K219 Gastro-esophageal reflux disease without esophagitis: Secondary | ICD-10-CM | POA: Diagnosis not present

## 2024-01-27 DIAGNOSIS — K5900 Constipation, unspecified: Secondary | ICD-10-CM

## 2024-01-27 NOTE — Patient Instructions (Signed)
 For Irritable Bowel Syndrome / Colon Spasm / Abdominal Cramps:  IB Gard (Peppermint Oil) - Over the Counter Take 2 capsules Twice daily

## 2024-01-29 ENCOUNTER — Telehealth: Payer: Self-pay

## 2024-01-29 NOTE — Telephone Encounter (Signed)
 received fax requesting a refill on the buspirone hcl 10mg  (i don't see that on her medication list)   Pt last seen on 2-5 next appt 3-25

## 2024-01-30 NOTE — Telephone Encounter (Signed)
 Not currently on BuSpar.

## 2024-02-03 ENCOUNTER — Ambulatory Visit (INDEPENDENT_AMBULATORY_CARE_PROVIDER_SITE_OTHER): Payer: Self-pay | Admitting: Psychiatry

## 2024-02-03 ENCOUNTER — Encounter: Payer: Self-pay | Admitting: Psychiatry

## 2024-02-03 VITALS — BP 138/88 | HR 88 | Temp 98.7°F | Ht 62.0 in | Wt 161.8 lb

## 2024-02-03 DIAGNOSIS — F4310 Post-traumatic stress disorder, unspecified: Secondary | ICD-10-CM | POA: Diagnosis not present

## 2024-02-03 DIAGNOSIS — F429 Obsessive-compulsive disorder, unspecified: Secondary | ICD-10-CM | POA: Diagnosis not present

## 2024-02-03 DIAGNOSIS — Z634 Disappearance and death of family member: Secondary | ICD-10-CM

## 2024-02-03 DIAGNOSIS — F330 Major depressive disorder, recurrent, mild: Secondary | ICD-10-CM | POA: Diagnosis not present

## 2024-02-03 MED ORDER — TOPIRAMATE 50 MG PO TABS: 150.0000 mg | ORAL_TABLET | ORAL | 1 refills | Status: DC

## 2024-02-03 NOTE — Patient Instructions (Signed)
 Managing Loss, Adult  People experience loss in many different ways throughout their lives. Events such as moving, changing jobs, and losing friends can create a sense of loss. The loss may be as serious as a major health change, divorce, death of a pet, or death of a loved one. All of these types of loss are likely to create a physical and emotional reaction known as grief. Grief is the result of a major change or an absence of something or someone that you count on. Grief is a normal reaction to loss.  A variety of factors can affect your grieving experience, including:  The nature of your loss.  Your relationship to what or whom you lost.  Your understanding of grief and how to manage it.  Your support system.  Be aware that when grief becomes extreme, it can lead to more severe issues like isolation, depression, anxiety, or suicidal thoughts. Talk with your health care provider if you have any of these issues.  How to manage lifestyle changes    Keep to your normal routine as much as possible.  If you have trouble focusing or doing normal activities, it is acceptable to take some time away from your normal routine.  Spend time with friends and loved ones.  Eat a healthy diet, get plenty of sleep, and rest when you feel tired.  How to recognize changes   The way that you deal with your grief will affect your ability to function as you normally do. When grieving, you may experience these changes:  Numbness, shock, sadness, anxiety, anger, denial, and guilt.  Thoughts about death.  Unexpected crying.  A physical sensation of emptiness in your stomach.  Problems sleeping and eating.  Tiredness (fatigue).  Loss of interest in normal activities.  Dreaming about or imagining seeing the person who died.  A need to remember what or whom you lost.  Difficulty thinking about anything other than your loss for a period of time.  Relief. If you have been expecting the loss for a while, you may feel a sense of relief when it  happens.  Follow these instructions at home:  Activity    Express your feelings in healthy ways, such as:  Talking with others about your loss. It may be helpful to find others who have had a similar loss, such as a support group.  Writing down your feelings in a journal.  Doing physical activities to release stress and emotional energy.  Doing creative activities like painting, sculpting, or playing or listening to music.  Practicing resilience. This is the ability to recover and adjust after facing challenges. Reading some resources that encourage resilience may help you to learn ways to practice those behaviors.     General instructions  Be patient with yourself and others. Allow the grieving process to happen, and remember that grieving takes time.  It is likely that you may never feel completely done with some grief. You may find a way to move on while still cherishing memories and feelings about your loss.  Accepting your loss is a process. It can take months or longer to adjust.  Keep all follow-up visits. This is important.  Where to find support  To get support for managing loss:  Ask your health care provider for help and recommendations, such as grief counseling or therapy.  Think about joining a support group for people who are managing a loss.  Where to find more information  You can find more information  about managing loss from:  American Society of Clinical Oncology: www.cancer.net  American Psychological Association: [employer]  Contact a health care provider if:  Your grief is extreme and keeps getting worse.  You have ongoing grief that does not improve.  Your body shows symptoms of grief, such as illness.  You feel depressed, anxious, or hopeless.  Get help right away if:  You have thoughts about hurting yourself or others.  Get help right away if you feel like you may hurt yourself or others, or have thoughts about taking your own life. Go to your nearest emergency room or:  Call 911.  Call the  National Suicide Prevention Lifeline at 985-787-5965 or 988. This is open 24 hours a day.  Text the Crisis Text Line at (308)798-1395.  Summary  Grief is the result of a major change or an absence of someone or something that you count on. Grief is a normal reaction to loss.  The depth of grief and the period of recovery depend on the type of loss and your ability to adjust to the change and process your feelings.  Processing grief requires patience and a willingness to accept your feelings and talk about your loss with people who are supportive.  It is important to find resources that work for you and to realize that people experience grief differently. There is not one grieving process that works for everyone in the same way.  Be aware that when grief becomes extreme, it can lead to more severe issues like isolation, depression, anxiety, or suicidal thoughts. Talk with your health care provider if you have any of these issues.  This information is not intended to replace advice given to you by your health care provider. Make sure you discuss any questions you have with your health care provider.  Document Revised: 06/18/2021 Document Reviewed: 06/18/2021  Elsevier Patient Education  2024 [employer].

## 2024-02-03 NOTE — Progress Notes (Unsigned)
 [facility] MD OP Progress Note  02/03/2024 2:04 PM Melinda Shaw  MRN:  [id number]  Chief Complaint:  Chief Complaint  Patient presents with   Follow-up   Anxiety   Depression   Medication Refill   HPI: Melinda Shaw is a 58 year old female, lives in [place], married, on disability, has a history of PTSD, MDD, OCD, COPD, history of CVA, fibromyalgia, history of appendiceal neoplasm, history of prior ileocecectomy, history of hepatitis, chronic pain, knee joint, was evaluated in office today presented for a follow-up appointment.  She has been taking Zoloft, increased to 50 mg, split into half in the morning and half in the afternoon, which has reduced drowsiness compared to taking the full dose at once. This regimen is more effective and tolerable for her. She is also taking Topamax (topiramate) as a mood stabilizer, prescribed at 125 mg, taken at night.  She feels 'short fused' and irritable, uncertain if this is due to pain or changes in her medication regimen. Pain affects her mood, and she has apologized to her husband for her irritability, explaining her struggle with dependence on others due to pain and mobility issues.  She recently experienced the death of a man she considered her grandfather, which has been emotionally challenging. She describes a close relationship with him, having been adopted as his granddaughter 16 years ago. His passing has contributed to her current emotional state, and she experienced a panic attack during his viewing. She has not been seeing a therapist since her previous sessions were distressing, but she acknowledges the potential benefit of counseling.  She is dealing with post-traumatic osteoarthritis in her left knee, which has worsened, leading to increased pain and difficulty with mobility. She is not currently taking any pain medication but has received injections in her knee, including one at the hospital during an infection episode. She describes  significant pain impacting her sleep, waking her up at night, but she manages to get at least six hours of sleep. She notes an unsteady gait, particularly without her knee brace, which she cannot wear comfortably with jeans.  Her blood pressure has been fluctuating since discontinuing lisinopril due to fainting episodes. Her blood pressure has been high at recent medical appointments. She is scheduled for a cardiology workup to assess her arteries on April 1st.  Visit Diagnosis:    ICD-10-CM   1. PTSD (post-traumatic stress disorder)  F43.10 topiramate (TOPAMAX) 50 MG tablet    2. MDD (major depressive disorder), recurrent episode, mild (HCC)  F33.0 topiramate (TOPAMAX) 50 MG tablet    3. Obsessive-compulsive disorder with good or fair insight  F42.9     4. Bereavement  Z63.4       Past Psychiatric History: I have reviewed past psychiatric history from progress note on 12/06/2022.  Past trials of medications like Seroquel-weight gain, multiple others including lithium, Lunesta, Ambien.  Past Medical History:  Past Medical History:  Diagnosis Date   Anemia    H/O   Anxiety    Bronchitis    Chest pain    a. 09/2018 Cath: Nl cors. EF 65%->Med rx (long-acting nitrate added).   Depression    Diastolic dysfunction    a. 10/2018 Echo: EF 55-60%, no rwma, Gr1DD. Mild MR. Nl RV fxn.   DOE (dyspnea on exertion)    Fibromyalgia    GERD (gastroesophageal reflux disease)    Headache    Hyperlipidemia    Hypertension    no meds   Palpitations    a.  01/2019 [facility]): NSR w/ rare PACs & PVCs; b. 04/2020 [facility]: Predominantly sinus rhythm @ 75 (48-141). Rare PACs/PVCs. No significant arrhythmias or prolonged pauses.  No triggered events.   PONV (postoperative nausea and vomiting)     Past Surgical History:  Procedure Laterality Date   ANTERIOR CRUCIATE LIGAMENT REPAIR     CARDIAC CATHETERIZATION     CHOLECYSTECTOMY     CORONARY ANGIOPLASTY     ESOPHAGOGASTRODUODENOSCOPY (EGD) WITH PROPOFOL  N/A 08/28/2015   Procedure: ESOPHAGOGASTRODUODENOSCOPY (EGD) WITH PROPOFOL;  Surgeon: Elnita Maxwell, MD;  Location: [facility] ENDOSCOPY;  Service: Endoscopy;  Laterality: N/A;   ESOPHAGOGASTRODUODENOSCOPY (EGD) WITH PROPOFOL N/A 01/31/2022   Procedure: ESOPHAGOGASTRODUODENOSCOPY (EGD) WITH PROPOFOL;  Surgeon: Wyline Mood, MD;  Location: [facility] ENDOSCOPY;  Service: Gastroenterology;  Laterality: N/A;   KNEE SURGERY     left knee    LEFT HEART CATH AND CORONARY ANGIOGRAPHY Left 09/22/2018   Procedure: LEFT HEART CATH AND CORONARY ANGIOGRAPHY;  Surgeon: [name], MD;  Location: [facility] INVASIVE CV LAB;  Service: Cardiovascular;  Laterality: Left;   MASS EXCISION Left 01/26/2016   Procedure: EXCISION OF LEFT WRIST VOLAR MASS;  Surgeon: Dairl Ponder, MD;  Location: [facility];  Service: Orthopedics;  Laterality: Left;   NOSE SURGERY     POLYPECTOMY     TUBAL LIGATION     VIDEO BRONCHOSCOPY N/A 05/29/2015   Procedure: VIDEO BRONCHOSCOPY WITHOUT FLUORO;  Surgeon: Stephanie Acre, MD;  Location: [facility] ORS;  Service: Cardiopulmonary;  Laterality: N/A;    Family Psychiatric History: I have reviewed family psychiatric history from progress note on 12/06/2022.  Family History:  Family History  Problem Relation Age of Onset   Dementia Mother    Alcohol abuse Mother    COPD Mother    High Cholesterol Mother    Heart failure Mother    Hypertension Mother    Dementia Father    Alcohol abuse Father    Hypertension Father    AAA (abdominal aortic aneurysm) Sister    COPD Sister    High Cholesterol Sister     Social History: I have reviewed social history from progress note on 12/06/2022. Social History   Socioeconomic History   Marital status: Married    Spouse name: david   Number of children: 3   Years of education: Not on file   Highest education level: 10th grade  Occupational History   Not on file  Tobacco Use   Smoking status: Never   Smokeless tobacco: Never   Vaping Use   Vaping status: Never Used  Substance and Sexual Activity   Alcohol use: No    Alcohol/week: 0.0 standard drinks of alcohol   Drug use: No   Sexual activity: Yes  Other Topics Concern   Not on file  Social History Narrative   Not on file   Social Drivers of Health   Financial Resource Strain: Low Risk  (12/05/2023)   Overall Financial Resource Strain (CARDIA)    Difficulty of Paying Living Expenses: Not hard at all  Recent Concern: Financial Resource Strain - Medium Risk (10/13/2023)   Received from [facility]   Overall Financial Resource Strain (CARDIA)    Difficulty of Paying Living Expenses: Somewhat hard  Food Insecurity: Food Insecurity Present (12/05/2023)   Hunger Vital Sign    Worried About Running Out of Food in the Last Year: Sometimes true    Ran Out of Food in the Last Year: Sometimes true  Transportation Needs: Unmet  Transportation Needs (12/05/2023)   PRAPARE - Administrator, Civil Service (Medical): Yes    Lack of Transportation (Non-Medical): Yes  Physical Activity: Sufficiently Active (12/05/2023)   Exercise Vital Sign    Days of Exercise per Week: 3 days    Minutes of Exercise per Session: 90 min  Stress: Stress Concern Present (12/05/2023)   [employer] of Occupational Health - Occupational Stress Questionnaire    Feeling of Stress : Rather much  Social Connections: Socially Isolated (12/05/2023)   Social Connection and Isolation Panel [NHANES]    Frequency of Communication with Friends and Family: Never    Frequency of Social Gatherings with Friends and Family: Never    Attends Religious Services: Never    Database administrator or Organizations: No    Attends Banker Meetings: Never    Marital Status: Married    Allergies:  Allergies  Allergen Reactions   Cherry Swelling    Swelling of lips and throat   Other Shortness Of Breath    Stopped breathing, Swelling of throat, Stopped breathing,  Swelling of throat, Stopped breathing, Swelling of throat   Shellfish Allergy Shortness Of Breath   Sulfa Antibiotics Shortness Of Breath    Stopped breathing, Swelling of throat   Bee Venom     Swelling of throat   Imdur [Isosorbide Nitrate]     Worsening headaches     Metabolic Disorder Labs: Lab Results  Component Value Date   HGBA1C 5.6 09/05/2022   No results found for: "PROLACTIN" Lab Results  Component Value Date   CHOL 121 04/18/2023   TRIG 81 04/18/2023   HDL 52 04/18/2023   CHOLHDL 2.3 04/18/2023   VLDL 16 04/18/2023   LDLCALC 53 04/18/2023   Lab Results  Component Value Date   TSH 1.810 01/13/2024   TSH 2.212 04/18/2023    Therapeutic Level Labs: Lab Results  Component Value Date   LITHIUM 0.12 (L) 11/10/2023   LITHIUM 0.58 (L) 10/13/2023   No results found for: "VALPROATE" No results found for: "CBMZ"  Current Medications: Current Outpatient Medications  Medication Sig Dispense Refill   aspirin EC 81 MG tablet Take 81 mg by mouth daily.     clonazePAM (KLONOPIN) 0.5 MG tablet Take 0.5 mg by mouth as directed. Takes as needed few times a week only for severe panic attacks     cyanocobalamin (VITAMIN B12) 1000 MCG/ML injection INJECT 1 ML INTO THE MUSCLE EVERY THIRTY DAYS     dicyclomine (BENTYL) 10 MG capsule Take 1 capsule (10 mg total) by mouth 3 (three) times daily before meals. 90 capsule 5   diltiazem (CARDIZEM CD) 240 MG 24 hr capsule Take 1 capsule (240 mg total) by mouth daily. 90 capsule 3   diltiazem (CARDIZEM) 30 MG tablet Take 1 tablet (30 mg total) by mouth daily as needed (palpitations). 90 tablet 0   docusate sodium (COLACE) 100 MG capsule Take 1 capsule (100 mg total) by mouth 2 (two) times daily as needed for mild constipation. 180 capsule 3   EPINEPHrine 0.3 mg/0.3 mL IJ SOAJ injection Inject 0.3 mg into the muscle as needed for anaphylaxis. 1 each 1   famotidine (PEPCID) 20 MG tablet TAKE 1 TABLET BY MOUTH AT BEDTIME 90 tablet 1    Galcanezumab-gnlm 120 MG/ML SOAJ Inject into the skin.     hydrOXYzine (VISTARIL) 25 MG capsule Take 1 capsule (25 mg total) by mouth 3 (three) times daily as needed for anxiety. And  sleep 270 capsule 1   LINZESS 290 MCG CAPS capsule Take 1 capsule (290 mcg total) by mouth daily. 30 capsule 5   methylPREDNISolone (MEDROL DOSEPAK) 4 MG TBPK tablet See admin instructions.     nitroGLYCERIN (NITROSTAT) 0.4 MG SL tablet PLACE ONE TABLET UNDER THE TONGUE EVERY 5 MINUTES AS NEEDED FOR CHEST PAIN. MAXIMUM OF 3 DOSES. 25 tablet 3   omeprazole (PRILOSEC) 40 MG capsule TAKE 1 CAPSULE BY MOUTH DAILY 90 capsule 1   ondansetron (ZOFRAN ODT) 4 MG disintegrating tablet Take 1 tablet (4 mg total) by mouth every 8 (eight) hours as needed for nausea or vomiting. 12 tablet 0   ondansetron (ZOFRAN) 4 MG tablet Take 1 tablet by mouth every 8 (eight) hours as needed.     rosuvastatin (CRESTOR) 10 MG tablet Take 1 tablet (10 mg total) by mouth at bedtime. 90 tablet 3   sertraline (ZOLOFT) 50 MG tablet Take 50 mg by mouth daily.     tiZANidine (ZANAFLEX) 4 MG tablet Take 4 mg by mouth once.     potassium chloride SA (KLOR-CON M20) 20 MEQ tablet Take 1 tablet (20 mEq total) by mouth 2 (two) times daily. 180 tablet 3   topiramate (TOPAMAX) 50 MG tablet Take 3 tablets (150 mg total) by mouth as directed. Take 1 tablet daily at 11 AM and 2 tablets daily at bedtime 90 tablet 1   No current facility-administered medications for this visit.     Musculoskeletal: Strength & Muscle Tone: within normal limits Gait & Station:  limping due to knee pain  Patient leans: N/A  Psychiatric Specialty Exam: Review of Systems  Psychiatric/Behavioral:  Positive for dysphoric mood. The patient is nervous/anxious.        Grief, irritable    Blood pressure 138/88, pulse 88, temperature 98.7 F (37.1 C), temperature source Temporal, height 5\' 2"  (1.575 m), weight 161 lb 12.8 oz (73.4 kg), last menstrual period 09/24/2016, SpO2 96%.Body  mass index is 29.59 kg/m.  General Appearance: Casual  Eye Contact:  Fair  Speech:  Clear and Coherent  Volume:  Normal  Mood:  Anxious and Depressed, grieving, irritable  Affect:  Congruent  Thought Process:  Goal Directed and Descriptions of Associations: Intact  Orientation:  Full (Time, Place, and Person)  Thought Content: Logical   Suicidal Thoughts:  No  Homicidal Thoughts:  No  Memory:  Immediate;   Fair Recent;   Fair Remote;   Fair  Judgement:  Fair  Insight:  Fair  Psychomotor Activity:  Normal  Concentration:  Concentration: Fair and Attention Span: Fair  Recall:  [employer] of Knowledge: Fair  Language: Fair  Akathisia:  No  Handed:  Right  AIMS (if indicated): not done  Assets:  Desire for Improvement Housing Social Support  ADL's:  Intact  Cognition: WNL  Sleep:   improved   Screenings: GAD-7    Loss adjuster, chartered Office Visit from 02/03/2024 in [facility] Office Visit from 12/17/2023 in [facility] Counselor from 05/07/2023 in [facility] Video Visit from 02/18/2023 in [facility] Counselor from 02/12/2023 in [facility]  Total GAD-7 Score 12 13 9 9 6       PHQ2-9    Flowsheet Row Office Visit from 02/03/2024 in [facility] Office Visit from 12/17/2023 in [facility] Video Visit from 10/24/2023 in [facility] Clinical Support from 10/21/2023 in [facility] Office Visit from 06/13/2023 in [facility]  PHQ-2 Total Score 4 1 1 1 1   PHQ-9 Total Score 14 10 11 3 7       Flowsheet Row Office Visit from 02/03/2024 in [facility] Office Visit from 12/17/2023 in [facility] Video Visit from 10/24/2023 in [facility]  C-SSRS RISK CATEGORY No Risk No Risk No Risk        Assessment and Plan: Melinda Shaw is a 58 year old female, disability, lives in [place], has a history of PTSD, MDD, OCD was evaluated in office today, discussed assessment and plan as noted below.  Major Depressive Disorder-unstable Currently on sertraline 50 mg, divided into half doses in the morning and afternoon, reducing drowsiness. Reports improvement with this regimen. Topiramate is being increased to 150 mg daily to address mood swings and irritability, which may be related to pain or medication adjustments. Not currently seeing a therapist but encouraged to consider grief counseling or journaling to process recent grief. - Increase Topiramate to 150 mg daily, taken as one tablet in the morning and two at bedtime. - Continue Sertraline 50 mg daily in divided dosage. - Provide information on grief counseling and resources. - Archivist as a coping mechanism. - Not interested in referral for psychotherapy at this time  PTSD-chronic with residual symptoms Currently reports irritability denies any other trauma related symptoms. - Continue Sertraline as prescribed.  OCD-improving Denies any significant OCD symptoms. - Continue Hydroxyzine 25 mg 3 times a day as needed - Continue Clonazepam 0.5 mg few times a week only for severe panic attacks as needed  Grief-unstable Experiencing grief following the death of a close family friend considered a grandfather figure. Had a panic attack during the viewing and is processing the loss. Encouraged to consider grief counseling or journaling to help process emotions. - Provide information on grief counseling and resources. - Encourage journaling as a coping mechanism.  Follow-up - Follow-up in clinic in 6 weeks or sooner if  needed.     Collaboration of Care: Collaboration of Care: Referral or follow-up with counselor/therapist AEB patient encouraged to reestablish care with therapist, patient to consider the same.  Patient/Guardian was advised Release of Information must be obtained prior to any record release in order to collaborate their care with an outside provider. Patient/Guardian was advised if they have not already done so to contact the registration department to sign all necessary forms in order for [place] to release information regarding their care.   Consent: Patient/Guardian gives verbal consent for treatment and assignment of benefits for services provided during this visit. Patient/Guardian expressed understanding and agreed to proceed.  This note was generated in part or whole with voice recognition software. Voice recognition is usually quite accurate but there are transcription errors that can and very often do occur. I apologize for any typographical errors that were not detected and corrected.   Discussed the use of a AI scribe software for clinical note transcription with the patient, who gave verbal consent to proceed.    Jomarie Longs, MD 02/04/2024, 4:05 PM

## 2024-02-10 ENCOUNTER — Ambulatory Visit: Attending: Student

## 2024-02-10 DIAGNOSIS — R42 Dizziness and giddiness: Secondary | ICD-10-CM

## 2024-02-13 ENCOUNTER — Telehealth: Payer: Self-pay | Admitting: Family Medicine

## 2024-02-13 MED ORDER — FAMOTIDINE 20 MG PO TABS: 20.0000 mg | ORAL_TABLET | Freq: Every day | ORAL | 1 refills | Status: DC

## 2024-02-13 NOTE — Telephone Encounter (Signed)
 [facility] faxed refill request for the following medications:   famotidine (PEPCID) 20 MG tablet     Please advise.

## 2024-03-01 ENCOUNTER — Ambulatory Visit: Admitting: Physician Assistant

## 2024-03-01 NOTE — Progress Notes (Deleted)
 Cardiology Office Note    Date:  03/01/2024   ID:  Melinda Shaw, DOB 04-20-1966, MRN [id number]  PCP:  Melinda Farr, FNP  Cardiologist:  Melinda Crisp, MD  Electrophysiologist:  None   Chief Complaint: Follow up  History of Present Illness:   Melinda Shaw is a 58 y.o. female with history of chest pain with normal coronary arteries by coronary CTA, dyspnea on exertion, palpitations, hypertension, hyperlipidemia, fibromyalgia, PTSD, bronchitis, and GERD who presents for follow-up of HTN.   Echo in 2016 showed an EF of 60 to 65%, no regional wall motion abnormalities and normal diastolic function.  LHC in 09/2018 showed no angiographically significant CAD with normal LV systolic function and filling pressure.  Echo in 10/2018 showed an EF of 55 to 60%, no regional wall motion abnormalities, grade 1 diastolic dysfunction, mild mitral regurgitation normal RV systolic function and RVSP.  Zio patch in 04/2020 showed a predominant rhythm of sinus with an average rate of 75 bpm with rare PACs and PVCs.  No significant arrhythmia or prolonged pause was observed.  Coronary CTA in 01/2021 showed a calcium  score of 0 with no evidence of CAD.  Zio patch monitoring in 05/2023 showed a predominant rhythm of sinus with 2 episodes of SVT lasting up to 5 beats, and rare PACs and PVCs.  There were 33 patient triggered events, most of which corresponded to sinus rhythm.  A few triggered events also contained PACs.  She was most recently seen in 10/2019 for noting concerns about elevated BP readings and medication changes.  She notes the symptoms began after she received a puncture wound from a [place] that she keeps.  She was bordering on sepsis and was on "high-powered antibiotics."  In the setting of this, her blood pressures were elevated, prompting [name] to add lisinopril  for blood pressure control.  Ms. Ilyas subsequently saw her mental health provider, Dr. Avonne Shaw, who noted negligible increase in  creatinine (0.91->1.03) and recommended cessation of lisinopril  in the setting of long-term lithium  use.  He otherwise suggested weaning off of lithium .  She continued to note elevated readings at her PCP occasional mild chest tightness that she attributed to stress, similar to prior visits.  Blood pressure in the office was mildly elevated at 156/86.  After discussion with the patient, she was resumed on lisinopril  10 mg daily with follow-up labs noted to have stable renal function and potassium.  She was seen in our office in 11/2023 and continued to note occasional palpitations that were stable.  BP at home range from 115-160s systolic.  She was adherent and tolerating diltiazem  and lisinopril .  She was continued on diltiazem  with titration of lisinopril  to 20 mg daily.  However, several was subsequently held with dizziness and associated hypotension with reported BP of 87/59.  She was seen in the office on 02/18/2024 for dizziness that persisted despite discontinuing lisinopril .  She reported "I am spinning one way and everything else is a spinning the other."  She also reported an episode of syncope on 12/25/2023 that was preceded by dizziness.  BP was stable and she was advised to follow-up with PCP for possible vertigo.  Carotid artery ultrasound unrevealing.  ***   Labs independently reviewed: 01/2024 - TSH normal, magnesium 2.3, Hgb 14.0, PLT 238, BUN 13, serum renin 0.84, potassium 4.4 09/2023 - albumin 4.2, AST/ALT normal 04/2023 - TC 121, TG 81, HDL 52, LDL 53 08/2022 - A1c 5.6  Past Medical History:  Diagnosis  Date   Anemia    H/O   Anxiety    Bronchitis    Chest pain    a. 09/2018 Cath: Nl cors. EF 65%->Med rx (long-acting nitrate added).   Depression    Diastolic dysfunction    a. 10/2018 Echo: EF 55-60%, no rwma, Gr1DD. Mild MR. Nl RV fxn.   DOE (dyspnea on exertion)    Fibromyalgia    GERD (gastroesophageal reflux disease)    Headache    Hyperlipidemia    Hypertension    no  meds   Palpitations    a. 01/2019 Zio [facility]): NSR w/ rare PACs & PVCs; b. 04/2020 Zio: Predominantly sinus rhythm @ 75 (48-141). Rare PACs/PVCs. No significant arrhythmias or prolonged pauses.  No triggered events.   PONV (postoperative nausea and vomiting)     Past Surgical History:  Procedure Laterality Date   ANTERIOR CRUCIATE LIGAMENT REPAIR     CARDIAC CATHETERIZATION     CHOLECYSTECTOMY     CORONARY ANGIOPLASTY     ESOPHAGOGASTRODUODENOSCOPY (EGD) WITH PROPOFOL  N/A 08/28/2015   Procedure: ESOPHAGOGASTRODUODENOSCOPY (EGD) WITH PROPOFOL ;  Surgeon: Melinda Sager, MD;  Location: [facility] ENDOSCOPY;  Service: Endoscopy;  Laterality: N/A;   ESOPHAGOGASTRODUODENOSCOPY (EGD) WITH PROPOFOL  N/A 01/31/2022   Procedure: ESOPHAGOGASTRODUODENOSCOPY (EGD) WITH PROPOFOL ;  Surgeon: Melinda Salaam, MD;  Location: [facility] ENDOSCOPY;  Service: Gastroenterology;  Laterality: N/A;   KNEE SURGERY     left knee    LEFT HEART CATH AND CORONARY ANGIOGRAPHY Left 09/22/2018   Procedure: LEFT HEART CATH AND CORONARY ANGIOGRAPHY;  Surgeon: Melinda Crisp, MD;  Location: [facility] INVASIVE CV LAB;  Service: Cardiovascular;  Laterality: Left;   MASS EXCISION Left 01/26/2016   Procedure: EXCISION OF LEFT WRIST VOLAR MASS;  Surgeon: Melinda Hurter, MD;  Location: [facility];  Service: Orthopedics;  Laterality: Left;   NOSE SURGERY     POLYPECTOMY     TUBAL LIGATION     VIDEO BRONCHOSCOPY N/A 05/29/2015   Procedure: VIDEO BRONCHOSCOPY WITHOUT FLUORO;  Surgeon: Melinda Piggs, MD;  Location: [facility] ORS;  Service: Cardiopulmonary;  Laterality: N/A;    Current Medications: No outpatient medications have been marked as taking for the 03/01/24 encounter (Appointment) with Melinda Chick, PA-C.    Allergies:   Cherry, Other, Shellfish allergy, Sulfa antibiotics, Bee venom, and Imdur  [isosorbide  nitrate]   Social History   Socioeconomic History   Marital status: Married    Spouse name: Melinda Shaw   Number of children:  3   Years of education: Not on file   Highest education level: 10th grade  Occupational History   Not on file  Tobacco Use   Smoking status: Never   Smokeless tobacco: Never  Vaping Use   Vaping status: Never Used  Substance and Sexual Activity   Alcohol use: No    Alcohol/week: 0.0 standard drinks of alcohol   Drug use: No   Sexual activity: Yes  Other Topics Concern   Not on file  Social History Narrative   Not on file   Social Drivers of Health   Financial Resource Strain: Low Risk  (12/05/2023)   Overall Financial Resource Strain (CARDIA)    Difficulty of Paying Living Expenses: Not hard at all  Recent Concern: Financial Resource Strain - Medium Risk (10/13/2023)   Received from [facility]   Overall Financial Resource Strain (CARDIA)    Difficulty of Paying Living Expenses: Somewhat hard  Food Insecurity: Food Insecurity Present (12/05/2023)   Hunger Vital Sign  Worried About Programme researcher, broadcasting/film/video in the Last Year: Sometimes true    Ran Out of Food in the Last Year: Sometimes true  Transportation Needs: Unmet Transportation Needs (12/05/2023)   PRAPARE - Administrator, Civil Service (Medical): Yes    Lack of Transportation (Non-Medical): Yes  Physical Activity: Sufficiently Active (12/05/2023)   Exercise Vital Sign    Days of Exercise per Week: 3 days    Minutes of Exercise per Session: 90 min  Stress: Stress Concern Present (12/05/2023)   [employer] of Occupational Health - Occupational Stress Questionnaire    Feeling of Stress : Rather much  Social Connections: Socially Isolated (12/05/2023)   Social Connection and Isolation Panel [NHANES]    Frequency of Communication with Friends and Family: Never    Frequency of Social Gatherings with Friends and Family: Never    Attends Religious Services: Never    Database administrator or Organizations: No    Attends Engineer, structural: Never    Marital Status: Married      Family History:  The patient's family history includes AAA (abdominal aortic aneurysm) in her sister; Alcohol abuse in her father and mother; COPD in her mother and sister; Dementia in her father and mother; Heart failure in her mother; High Cholesterol in her mother and sister; Hypertension in her father and mother.  ROS:   12-point review of systems is negative unless otherwise noted in the HPI.   EKGs/Labs/Other Studies Reviewed:    Studies reviewed were summarized above. The additional studies were reviewed today:  Zio patch 05/2023:   The patient was monitored for 13 days, 20 hours.   The predominant rhythm was sinus with an average rate of 70 bpm (range 43-154 bpm).   There were rare PACs and PVCs.   two supraventricular runs were observed, lasting up to 5 beats with a maximum rate of 154 bpm.   No sustained arrhythmia or prolonged pauses occurred.   There were 33 patient triggered events, most of which corresponded to sinus rhythm.  A few triggered events also contained PACs.   Predominantly sinus rhythm with rare PACs and PVCs as well as two brief supraventricular runs.  No significant arrhythmia identified to explain the patient's symptoms. __________   Coronary CTA 01/11/2021: Aorta:  Normal size.  No calcifications.  No dissection.   Aortic Valve:  Trileaflet.  No calcifications.   Coronary Arteries:  Normal coronary origin.  Right dominance.   RCA is a dominant artery that gives rise to PDA and PLA. There is no plaque.   Left main is a large artery that gives rise to LAD and LCX arteries.   LAD has no plaque or evidence for disease.   LCX is a non-dominant artery that gives rise to two obtuse marginal branches. There is no plaque.   Other findings:   Normal pulmonary vein drainage into the left atrium.   Normal left atrial appendage without a thrombus.   Normal size of the pulmonary artery.   IMPRESSION: 1. Normal coronary calcium  score of 0. Patient is  low risk for coronary events. 2. Normal coronary origin with right dominance. 3. No evidence of CAD. __________   Zio patch 04/2020: The patient was monitored for 12 days, 23 hours. The predominant rhythm was sinus with an average rate of 75 bpm (range 48-141 bpm). There were rare PAC's and PVC's. No significant arrhythmia or prolonged pause was observed. There were no patient triggered events.  Predominantly sinus rhythm with rare PAC's and PVC's.  No significant arrhythmia noted. __________   2D echo 10/29/2018: - Left ventricle: The cavity size was normal. Systolic function was    normal. The estimated ejection fraction was in the range of 55%    to 60%. Wall motion was normal; there were no regional wall    motion abnormalities. Doppler parameters are consistent with    abnormal left ventricular relaxation (grade 1 diastolic    dysfunction).  - Mitral valve: There was mild regurgitation.  - Left atrium: The atrium was normal in size.  - Right ventricle: Systolic function was normal.  - Pulmonary arteries: Systolic pressure was within the normal    range.  __________   [facility] 09/22/2018: Conclusions: No angiographically significant coronary artery disease. Normal left ventricular systolic function and filling pressure.   Recommendations:  Continue medical therapy, including propranolol and isosorbide  mononitrate in case there is an element of microvascular dysfunction and/or coronary vasospasm. Primary prevention of coronary artery disease.   No indication for antiplatelet therapy at this time.  __________   2D echo 04/17/2015: - Left ventricle: The cavity size was normal. Wall thickness was    normal. Systolic function was normal. The estimated ejection    fraction was in the range of 60% to 65%. Wall motion was normal;    there were no regional wall motion abnormalities. Left    ventricular diastolic function parameters were normal.   Impressions:   - Normal  study.   EKG:  EKG is ordered today.  The EKG ordered today demonstrates ***  Recent Labs: 01/13/2024: BUN 13; Creatinine, Ser 0.84; Hemoglobin 14.0; Magnesium 2.3; Platelets 238; Potassium 4.4; Sodium 140; TSH 1.810  Recent Lipid Panel    Component Value Date/Time   CHOL 121 04/18/2023 1150   TRIG 81 04/18/2023 1150   HDL 52 04/18/2023 1150   CHOLHDL 2.3 04/18/2023 1150   VLDL 16 04/18/2023 1150   LDLCALC 53 04/18/2023 1150    PHYSICAL EXAM:    VS:  LMP 09/24/2016 (Approximate)   BMI: There is no height or weight on file to calculate BMI.  Physical Exam  Wt Readings from Last 3 Encounters:  01/27/24 161 lb (73 kg)  01/13/24 158 lb (71.7 kg)  12/09/23 158 lb (71.7 kg)     ASSESSMENT & PLAN:   HTN: Blood pressure  Chest pain and palpitations:   {Are you ordering a CV Procedure (e.g. stress test, cath, DCCV, TEE, etc)?   Press F2        :[id number]}     Disposition: F/u with Dr. Nolan Battle or an APP in ***.   Medication Adjustments/Labs and Tests Ordered: Current medicines are reviewed at length with the patient today.  Concerns regarding medicines are outlined above. Medication changes, Labs and Tests ordered today are summarized above and listed in the Patient Instructions accessible in Encounters.   Signed, Varney Gentleman, PA-C 03/01/2024 6:57 AM     [facility] [address] [place], [place] [address] [phone]

## 2024-03-08 ENCOUNTER — Encounter: Payer: Self-pay | Admitting: Family Medicine

## 2024-03-08 ENCOUNTER — Ambulatory Visit (INDEPENDENT_AMBULATORY_CARE_PROVIDER_SITE_OTHER): Admitting: Family Medicine

## 2024-03-08 VITALS — BP 141/81 | HR 76 | Ht 60.0 in | Wt 162.5 lb

## 2024-03-08 DIAGNOSIS — R739 Hyperglycemia, unspecified: Secondary | ICD-10-CM

## 2024-03-08 DIAGNOSIS — I890 Lymphedema, not elsewhere classified: Secondary | ICD-10-CM

## 2024-03-08 DIAGNOSIS — E66811 Obesity, class 1: Secondary | ICD-10-CM

## 2024-03-08 DIAGNOSIS — Z6831 Body mass index (BMI) 31.0-31.9, adult: Secondary | ICD-10-CM

## 2024-03-08 DIAGNOSIS — R3915 Urgency of urination: Secondary | ICD-10-CM

## 2024-03-08 DIAGNOSIS — R600 Localized edema: Secondary | ICD-10-CM | POA: Diagnosis not present

## 2024-03-08 LAB — POCT URINALYSIS DIPSTICK
Appearance: NORMAL
Bilirubin, UA: NEGATIVE
Blood, UA: NEGATIVE
Glucose, UA: NEGATIVE
Ketones, UA: NEGATIVE
Leukocytes, UA: NEGATIVE
Nitrite, UA: NEGATIVE
Protein, UA: NEGATIVE
Spec Grav, UA: 1.015 (ref 1.010–1.025)

## 2024-03-08 MED ORDER — NALTREXONE HCL 50 MG PO TABS: 25.0000 mg | ORAL_TABLET | Freq: Every day | ORAL | 2 refills | Status: DC

## 2024-03-08 MED ORDER — BUPROPION HCL ER (XL) 150 MG PO TB24: 150.0000 mg | ORAL_TABLET | Freq: Every day | ORAL | 2 refills | Status: DC

## 2024-03-08 NOTE — Progress Notes (Signed)
 Acute visit   Patient: Melinda Shaw   DOB: 1966-02-24   58 y.o. Female  MRN: [id number] PCP: Tasia Farr, FNP   Chief Complaint  Patient presents with   Edema    Patient reports leg edema associated with tightening and heaviness and weight gain.  Pt reports her movement has increased to the point she is taking 15-16,000 steps a day and constantly lifting things so not sure where the weight gain is coming from or swelling. Pt reports she started feeling the difference about 2 months ago but for sure noticed in the last month   Urinary Tract Infection    Pt reports noticing a urgency to go to the restroom with minor output    Subjective    Discussed the use of AI scribe software for clinical note transcription with the patient, who gave verbal consent to proceed.  History of Present Illness   The patient, with a history of septic joint and arthritis, presents with leg swelling, weight gain, and urinary issues. The patient reports that despite regular exercise and a healthy diet, she has been experiencing weight gain, which she attributes to fluid retention. The patient also reports frequent urination but only dribbling, without any associated thirst, burning, or blood in the urine. The patient spends a lot of time outdoors and does not feel dehydrated. The leg swelling has been ongoing for a couple of months and has been causing discomfort and heaviness in the legs. The patient has tried managing the swelling with compression socks and soaking in warm Epsom salt baths, but with limited success. The patient also mentions a history of septic joint following a [place] attack, which required hospitalization. The patient is due for knee injections for arthritis and is concerned about the weight gain affecting her knees.        Review of Systems  Objective    BP (!) 141/81 (BP Location: Left Arm, Patient Position: Sitting, Cuff Size: Large)   Pulse 76   Ht 5' (1.524 m)   Wt 162  lb 8 oz (73.7 kg)   LMP 09/24/2016 (Approximate)   SpO2 100%   BMI 31.74 kg/m  Physical Exam Vitals reviewed.  Constitutional:      General: She is not in acute distress.    Appearance: Normal appearance. She is well-developed. She is not diaphoretic.  HENT:     Head: Normocephalic and atraumatic.  Eyes:     General: No scleral icterus.    Conjunctiva/sclera: Conjunctivae normal.  Neck:     Thyroid : No thyromegaly.  Cardiovascular:     Rate and Rhythm: Normal rate and regular rhythm.     Heart sounds: Normal heart sounds. No murmur heard. Pulmonary:     Effort: Pulmonary effort is normal. No respiratory distress.     Breath sounds: Normal breath sounds. No wheezing, rhonchi or rales.  Abdominal:     General: There is no distension.     Palpations: Abdomen is soft.     Tenderness: There is no abdominal tenderness.  Musculoskeletal:     Cervical back: Neck supple.     Right lower leg: Edema (non-pitting) present.     Left lower leg: Edema (non-pitting) present.  Lymphadenopathy:     Cervical: No cervical adenopathy.  Skin:    General: Skin is warm and dry.     Findings: No rash.  Neurological:     Mental Status: She is alert and oriented to person, place, and time.  Mental status is at baseline.  Psychiatric:        Mood and Affect: Mood normal.        Behavior: Behavior normal.       No results found for any visits on 03/08/24.  Assessment & Plan     Problem List Items Addressed This Visit   None Visit Diagnoses       Lower extremity edema    -  Primary   Relevant Orders   Comprehensive metabolic panel with GFR   Hemoglobin A1c   Ambulatory referral to Vascular Surgery     Urinary urgency       Relevant Orders   POCT Urinalysis Dipstick     Hyperglycemia       Relevant Orders   Hemoglobin A1c     Lymphedema       Relevant Orders   Ambulatory referral to Vascular Surgery     Class 1 obesity without serious comorbidity with body mass index (BMI) of 31.0  to 31.9 in adult, unspecified obesity type               Lymphedema Chronic leg swelling with familial history, presenting as non-pitting edema, likely due to poor lymphatic drainage. Symptoms include puffy, heavy ankles and aching legs. Cardiologist previously assessed swelling as not severe. - Recommend daily use of compression socks when on feet - Refer to vascular specialists for lymphedema management - Consider lymphedema pumps if compression therapy is ineffective after three months  Weight management Significant weight gain despite high activity and dietary management. Discussed Contrave (Wellbutrin and naltrexone) for weight management. GLP-1 agonists like Ozempic and Wegovy not covered by Medicare. Phentermine not suitable due to potential for increased blood pressure. - Initiate Contrave (Wellbutrin and naltrexone) for weight management - Monitor progress in three months  Proteinuria Proteinuria noted a year ago. Current urinalysis shows no proteinuria or abnormalities. No symptoms of urinary tract or kidney infection. - Perform urinalysis to check for proteinuria and other abnormalities - Review urinalysis results - wnl with no protein, glucose, or infection  Septic arthritis of knee (resolved) h/o Septic arthritis in the knee resolved following a [place] attack. No current symptoms or issues.         Meds ordered this encounter  Medications   buPROPion (WELLBUTRIN XL) 150 MG 24 hr tablet    Sig: Take 1 tablet (150 mg total) by mouth daily.    Dispense:  30 tablet    Refill:  2   naltrexone (DEPADE) 50 MG tablet    Sig: Take 0.5 tablets (25 mg total) by mouth daily.    Dispense:  15 tablet    Refill:  2     Return in about 3 months (around 06/07/2024) for weight f/u, With PCP.      Aden Agreste, MD  [facility] [phone] (phone) [phone] (fax)  [facility]

## 2024-03-09 LAB — COMPREHENSIVE METABOLIC PANEL WITH GFR
ALT: 18 IU/L (ref 0–32)
AST: 14 IU/L (ref 0–40)
Albumin: 4.5 g/dL (ref 3.8–4.9)
Alkaline Phosphatase: 111 IU/L (ref 44–121)
BUN/Creatinine Ratio: 13 (ref 9–23)
BUN: 11 mg/dL (ref 6–24)
Bilirubin Total: 0.3 mg/dL (ref 0.0–1.2)
CO2: 21 mmol/L (ref 20–29)
Calcium: 10 mg/dL (ref 8.7–10.2)
Chloride: 106 mmol/L (ref 96–106)
Creatinine, Ser: 0.84 mg/dL (ref 0.57–1.00)
Globulin, Total: 2.7 g/dL (ref 1.5–4.5)
Glucose: 93 mg/dL (ref 70–99)
Potassium: 3.9 mmol/L (ref 3.5–5.2)
Sodium: 142 mmol/L (ref 134–144)
Total Protein: 7.2 g/dL (ref 6.0–8.5)
eGFR: 80 mL/min/{1.73_m2} (ref >59)

## 2024-03-09 LAB — HEMOGLOBIN A1C
Est. average glucose Bld gHb Est-mCnc: 114 mg/dL
Hgb A1c MFr Bld: 5.6 % (ref 4.8–5.6)

## 2024-03-10 ENCOUNTER — Encounter: Payer: Self-pay | Admitting: Family Medicine

## 2024-03-18 ENCOUNTER — Telehealth: Admitting: Psychiatry

## 2024-03-18 ENCOUNTER — Encounter: Payer: Self-pay | Admitting: Psychiatry

## 2024-03-18 ENCOUNTER — Ambulatory Visit: Admitting: Physician Assistant

## 2024-03-18 DIAGNOSIS — Z634 Disappearance and death of family member: Secondary | ICD-10-CM

## 2024-03-18 DIAGNOSIS — F3341 Major depressive disorder, recurrent, in partial remission: Secondary | ICD-10-CM

## 2024-03-18 DIAGNOSIS — F429 Obsessive-compulsive disorder, unspecified: Secondary | ICD-10-CM | POA: Diagnosis not present

## 2024-03-18 DIAGNOSIS — F4310 Post-traumatic stress disorder, unspecified: Secondary | ICD-10-CM | POA: Diagnosis not present

## 2024-03-18 MED ORDER — TOPIRAMATE 50 MG PO TABS: 150.0000 mg | ORAL_TABLET | ORAL | 2 refills | Status: DC

## 2024-03-18 NOTE — Progress Notes (Signed)
 Virtual Visit via Video Note  I connected with Melinda Shaw on 03/18/24 at  4:20 PM EDT by a video enabled telemedicine application and verified that I am speaking with the correct person using two identifiers.  Location Provider Location : [facility] Patient Location : Home  Participants: Patient , Provider   I discussed the limitations of evaluation and management by telemedicine and the availability of in person appointments. The patient expressed understanding and agreed to proceed.   I discussed the assessment and treatment plan with the patient. The patient was provided an opportunity to ask questions and all were answered. The patient agreed with the plan and demonstrated an understanding of the instructions.   The patient was advised to call back or seek an in-person evaluation if the symptoms worsen or if the condition fails to improve as anticipated.   BH MD OP Progress Note  03/19/2024 7:37 AM Melinda Shaw  MRN:  [id number]  Chief Complaint:  Chief Complaint  Patient presents with   Follow-up   Anxiety   Depression   Medication Refill   Discussed the use of AI scribe software for clinical note transcription with the patient, who gave verbal consent to proceed.  History of Present Illness Melinda Shaw "Ave Bobo" is a 58 year old female, married, lives in [place], on disability, has a history of PTSD, MDD, OCD, COPD, history of CVA, fibromyalgia, history of appendiceal neoplasm, history of prior ileocecectomy, history of hepatitis, chronic pain was evaluated by telemedicine today.  She has a history of PTSD, major depressive disorder (MDD), and obsessive-compulsive disorder (OCD). Her current medication regimen includes sertraline  50 mg, Topamax  150 mg (one tablet in the morning and two at bedtime), hydroxyzine , and clonazepam  as needed for severe panic attacks. Her symptoms have been stable with no significant exacerbations since her last visit in March. She  experiences moodiness, which she attributes to pain from another condition.  She is experiencing significant swelling in both legs, described as 'way worse' than previous episodes. The swelling is primarily in her ankles and feet, causing tenderness and pain that affects her walking and sleep. She recalls a similar condition in the past and mentions a family history of edema, as her mother experienced similar symptoms.  The pain does make her irritable although she believes the current combination of medication has been effective and she has not noticed any worsening of her mood symptoms.  She was tapered off of the lithium  previously and does not believe coming off of the lithium  has worsened her depression symptoms.  She has recently been prescribed Contrave (a combination of Wellbutrin  and naltrexone ) to aid in weight loss, taking half a pill of Wellbutrin  and one pill of naltrexone  in the morning. She also started Emgality injections for headaches, which she reports are effective after two months of treatment.  No new obsessions or compulsive behaviors.  Denies thoughts of self-harm or harm to others.  She is coping well with her grief.  Her husband continues to be supportive.    Visit Diagnosis:    ICD-10-CM   1. PTSD (post-traumatic stress disorder)  F43.10 topiramate  (TOPAMAX ) 50 MG tablet    2. Recurrent major depressive disorder, in partial remission (HCC)  F33.41 topiramate  (TOPAMAX ) 50 MG tablet    3. Obsessive-compulsive disorder with good or fair insight  F42.9     4. Bereavement  Z63.4       Past Psychiatric History: I have reviewed past psychiatric history from progress note on  12/06/2022.  Past trials of medications like Seroquel-weight gain, multiple others including lithium , Lunesta , Ambien .  Past Medical History:  Past Medical History:  Diagnosis Date   Anemia    H/O   Anxiety    Bronchitis    Chest pain    a. 09/2018 Cath: Nl cors. EF 65%->Med rx (long-acting  nitrate added).   Depression    Diastolic dysfunction    a. 10/2018 Echo: EF 55-60%, no rwma, Gr1DD. Mild MR. Nl RV fxn.   DOE (dyspnea on exertion)    Fibromyalgia    GERD (gastroesophageal reflux disease)    Headache    Hyperlipidemia    Hypertension    no meds   Palpitations    a. 01/2019 [facility]): NSR w/ rare PACs & PVCs; b. 04/2020 [facility]: Predominantly sinus rhythm @ 75 (48-141). Rare PACs/PVCs. No significant arrhythmias or prolonged pauses.  No triggered events.   PONV (postoperative nausea and vomiting)     Past Surgical History:  Procedure Laterality Date   ANTERIOR CRUCIATE LIGAMENT REPAIR     CARDIAC CATHETERIZATION     CHOLECYSTECTOMY     CORONARY ANGIOPLASTY     ESOPHAGOGASTRODUODENOSCOPY (EGD) WITH PROPOFOL  N/A 08/28/2015   Procedure: ESOPHAGOGASTRODUODENOSCOPY (EGD) WITH PROPOFOL ;  Surgeon: Luella Sager, MD;  Location: [facility] ENDOSCOPY;  Service: Endoscopy;  Laterality: N/A;   ESOPHAGOGASTRODUODENOSCOPY (EGD) WITH PROPOFOL  N/A 01/31/2022   Procedure: ESOPHAGOGASTRODUODENOSCOPY (EGD) WITH PROPOFOL ;  Surgeon: Luke Salaam, MD;  Location: [facility] ENDOSCOPY;  Service: Gastroenterology;  Laterality: N/A;   KNEE SURGERY     left knee    LEFT HEART CATH AND CORONARY ANGIOGRAPHY Left 09/22/2018   Procedure: LEFT HEART CATH AND CORONARY ANGIOGRAPHY;  Surgeon: Sammy Crisp, MD;  Location: [facility] INVASIVE CV LAB;  Service: Cardiovascular;  Laterality: Left;   MASS EXCISION Left 01/26/2016   Procedure: EXCISION OF LEFT WRIST VOLAR MASS;  Surgeon: Florida Hurter, MD;  Location: [facility];  Service: Orthopedics;  Laterality: Left;   NOSE SURGERY     POLYPECTOMY     TUBAL LIGATION     VIDEO BRONCHOSCOPY N/A 05/29/2015   Procedure: VIDEO BRONCHOSCOPY WITHOUT FLUORO;  Surgeon: Laine Piggs, MD;  Location: [facility] ORS;  Service: Cardiopulmonary;  Laterality: N/A;    Family Psychiatric History: I have reviewed family psychiatric history from progress note on  12/06/2022.  Family History:  Family History  Problem Relation Age of Onset   Dementia Mother    Alcohol abuse Mother    COPD Mother    High Cholesterol Mother    Heart failure Mother    Hypertension Mother    Dementia Father    Alcohol abuse Father    Hypertension Father    AAA (abdominal aortic aneurysm) Sister    COPD Sister    High Cholesterol Sister     Social History: I have reviewed social history from progress note on 12/06/2022. Social History   Socioeconomic History   Marital status: Married    Spouse name: david   Number of children: 3   Years of education: Not on file   Highest education level: 10th grade  Occupational History   Not on file  Tobacco Use   Smoking status: Never   Smokeless tobacco: Never  Vaping Use   Vaping status: Never Used  Substance and Sexual Activity   Alcohol use: No    Alcohol/week: 0.0 standard drinks of alcohol   Drug use: No   Sexual activity: Yes  Other Topics Concern   Not  on file  Social History Narrative   Not on file   Social Drivers of Health   Financial Resource Strain: Low Risk  (12/05/2023)   Overall Financial Resource Strain (CARDIA)    Difficulty of Paying Living Expenses: Not hard at all  Recent Concern: Financial Resource Strain - Medium Risk (10/13/2023)   [facility] from [facility]   Overall Financial Resource Strain (CARDIA)    Difficulty of Paying Living Expenses: Somewhat hard  Food Insecurity: Food Insecurity Present (12/05/2023)   Hunger Vital Sign    Worried About Running Out of Food in the Last Year: Sometimes true    Ran Out of Food in the Last Year: Sometimes true  Transportation Needs: Unmet Transportation Needs (12/05/2023)   PRAPARE - Transportation    Lack of Transportation (Medical): Yes    Lack of Transportation (Non-Medical): Yes  Physical Activity: Sufficiently Active (12/05/2023)   Exercise Vital Sign    Days of Exercise per Week: 3 days    Minutes of Exercise per  Session: 90 min  Stress: Stress Concern Present (12/05/2023)   [employer] of Occupational Health - Occupational Stress Questionnaire    Feeling of Stress : Rather much  Social Connections: Socially Isolated (12/05/2023)   Social Connection and Isolation Panel [NHANES]    Frequency of Communication with Friends and Family: Never    Frequency of Social Gatherings with Friends and Family: Never    Attends Religious Services: Never    Database administrator or Organizations: No    Attends Banker Meetings: Never    Marital Status: Married    Allergies:  Allergies  Allergen Reactions   Cherry Swelling    Swelling of lips and throat   Other Shortness Of Breath    Stopped breathing, Swelling of throat, Stopped breathing, Swelling of throat, Stopped breathing, Swelling of throat   Shellfish Allergy Shortness Of Breath   Sulfa Antibiotics Shortness Of Breath    Stopped breathing, Swelling of throat   Bee Venom     Swelling of throat   Imdur  [Isosorbide  Nitrate]     Worsening headaches     Metabolic Disorder Labs: Lab Results  Component Value Date   HGBA1C 5.6 03/08/2024   No results found for: "PROLACTIN" Lab Results  Component Value Date   CHOL 121 04/18/2023   TRIG 81 04/18/2023   HDL 52 04/18/2023   CHOLHDL 2.3 04/18/2023   VLDL 16 04/18/2023   LDLCALC 53 04/18/2023   Lab Results  Component Value Date   TSH 1.810 01/13/2024   TSH 2.212 04/18/2023    Therapeutic Level Labs: Lab Results  Component Value Date   LITHIUM  0.12 (L) 11/10/2023   LITHIUM  0.58 (L) 10/13/2023   No results found for: "VALPROATE" No results found for: "CBMZ"  Current Medications: Current Outpatient Medications  Medication Sig Dispense Refill   aspirin  EC 81 MG tablet Take 81 mg by mouth daily.     buPROPion  (WELLBUTRIN  XL) 150 MG 24 hr tablet Take 1 tablet (150 mg total) by mouth daily. 30 tablet 2   clonazePAM  (KLONOPIN ) 0.5 MG tablet Take 0.5 mg by mouth as directed.  Takes as needed few times a week only for severe panic attacks     cyanocobalamin (VITAMIN B12) 1000 MCG/ML injection INJECT 1 ML INTO THE MUSCLE EVERY THIRTY DAYS     dicyclomine  (BENTYL ) 10 MG capsule Take 1 capsule (10 mg total) by mouth 3 (three) times daily before meals. 90 capsule 5  diltiazem  (CARDIZEM  CD) 240 MG 24 hr capsule Take 1 capsule (240 mg total) by mouth daily. 90 capsule 3   diltiazem  (CARDIZEM ) 30 MG tablet Take 1 tablet (30 mg total) by mouth daily as needed (palpitations). 90 tablet 0   docusate sodium  (COLACE) 100 MG capsule Take 1 capsule (100 mg total) by mouth 2 (two) times daily as needed for mild constipation. 180 capsule 3   EPINEPHrine  0.3 mg/0.3 mL IJ SOAJ injection Inject 0.3 mg into the muscle as needed for anaphylaxis. 1 each 1   famotidine  (PEPCID ) 20 MG tablet Take 1 tablet (20 mg total) by mouth at bedtime. 90 tablet 1   Galcanezumab-gnlm 120 MG/ML SOAJ Inject into the skin.     hydrOXYzine  (VISTARIL ) 25 MG capsule Take 1 capsule (25 mg total) by mouth 3 (three) times daily as needed for anxiety. And sleep 270 capsule 1   LINZESS  290 MCG CAPS capsule Take 1 capsule (290 mcg total) by mouth daily. 30 capsule 5   naltrexone  (DEPADE) 50 MG tablet Take 0.5 tablets (25 mg total) by mouth daily. 15 tablet 2   nitroGLYCERIN  (NITROSTAT ) 0.4 MG SL tablet PLACE ONE TABLET UNDER THE TONGUE EVERY 5 MINUTES AS NEEDED FOR CHEST PAIN. MAXIMUM OF 3 DOSES. 25 tablet 3   omeprazole  (PRILOSEC) 40 MG capsule TAKE 1 CAPSULE BY MOUTH DAILY 90 capsule 1   ondansetron  (ZOFRAN  ODT) 4 MG disintegrating tablet Take 1 tablet (4 mg total) by mouth every 8 (eight) hours as needed for nausea or vomiting. 12 tablet 0   ondansetron  (ZOFRAN ) 4 MG tablet Take 1 tablet by mouth every 8 (eight) hours as needed.     potassium chloride  SA (KLOR-CON  M20) 20 MEQ tablet Take 1 tablet (20 mEq total) by mouth 2 (two) times daily. 180 tablet 3   rosuvastatin  (CRESTOR ) 10 MG tablet Take 1 tablet (10 mg  total) by mouth at bedtime. 90 tablet 3   sertraline  (ZOLOFT ) 50 MG tablet Take 50 mg by mouth daily.     tiZANidine  (ZANAFLEX ) 4 MG tablet Take 4 mg by mouth once.     topiramate  (TOPAMAX ) 50 MG tablet Take 3 tablets (150 mg total) by mouth as directed. Take 1 tablet daily at 11 AM and 2 tablets daily at bedtime 90 tablet 2   No current facility-administered medications for this visit.     Musculoskeletal: Strength & Muscle Tone: UTA Gait & Station: Seated Patient leans: N/A  Psychiatric Specialty Exam: Review of Systems  Psychiatric/Behavioral:  Positive for sleep disturbance. The patient is nervous/anxious.        Irritable    Last menstrual period 09/24/2016.There is no height or weight on file to calculate BMI.  General Appearance: Casual  Eye Contact:  Fair  Speech:  Clear and Coherent  Volume:  Normal  Mood:  Anxious, Irritable - managed on medications  Affect:  Congruent  Thought Process:  Goal Directed and Descriptions of Associations: Intact  Orientation:  Full (Time, Place, and Person)  Thought Content: Logical   Suicidal Thoughts:  No  Homicidal Thoughts:  No  Memory:  Immediate;   Fair Recent;   Fair Remote;   Fair  Judgement:  Fair  Insight:  Fair  Psychomotor Activity:  Normal  Concentration:  Concentration: Fair and Attention Span: Fair  Recall:  [employer] of Knowledge: Fair  Language: Fair  Akathisia:  No  Handed:  Right  AIMS (if indicated): not done  Assets:  Communication Skills Desire for Improvement  Housing Social Support Transportation  ADL's:  Intact  Cognition: WNL  Sleep:  restless due to leg pain   Screenings: GAD-7    Flowsheet Row Office Visit from 03/08/2024 in [facility] Office Visit from 02/03/2024 in [facility] Office Visit from 12/17/2023 in [facility] Counselor from 05/07/2023 in [facility] Video Visit from 02/18/2023 in [facility]  Total GAD-7 Score 4 12 13 9 9       PHQ2-9    Flowsheet Row Office Visit from 03/08/2024 in [facility] Office Visit from 02/03/2024 in [facility] Office Visit from 12/17/2023 in [facility] Video Visit from 10/24/2023 in [facility] Clinical Support from 10/21/2023 in [facility]  PHQ-2 Total Score 0 4 1 1 1   PHQ-9 Total Score 3 14 10 11 3       Flowsheet Row Video Visit from 03/18/2024 in [facility] Office Visit from 02/03/2024 in [facility] Office Visit from 12/17/2023 in [facility]  C-SSRS RISK CATEGORY No Risk No Risk No Risk        Assessment and Plan:Iline A Trembley is a 58 year old female, has a history of MDD, PTSD, OCD, grief, currently presents for a follow-up, discussed assessment and plan as noted below.  Assessment & Plan Major depressive disorder, recurrent, moderate in partial remission MDD is well-managed with the current medication regimen. Discontinuation of lithium  has not caused significant impact. Wellbutrin  was added for weight management, but caution is advised due to potential interactions with sertraline . Further increase in Wellbutrin  dose is not recommended due to current medication load and potential interactions. - Continue Sertraline  50 mg daily. - Continue Topiramate  150 mg daily in divided dosage. - Monitor for interactions between Wellbutrin  and sertraline .   Obsessive-compulsive disorder-stable OCD symptoms are well-controlled with sertraline . No exacerbation reported. - Continue Sertraline  50 mg daily. - Continue Hydroxyzine  25 mg 3 times a day as needed - Continue  Clonazepam  0.5 mg few times a week only.  PTSD-stable Currently does report irritability mostly due to pain.  Sleep issues also related to the pain.  Denies any significant PTSD related symptoms otherwise.  - Continue Clonazepam  0.5 mg few times a week only for severe panic attacks as needed.  - Continue Sertraline  as prescribed.  Grief-stable Currently coping with her grief. - Will reevaluate in future sessions.  Follow-up Follow-up in clinic in 2 to 3 months or sooner if needed.    Collaboration of Care: Collaboration of Care: Other encouraged to follow up with vascular surgeon as scheduled for leg edema.  Patient/Guardian was advised Release of Information must be obtained prior to any record release in order to collaborate their care with an outside provider. Patient/Guardian was advised if they have not already done so to contact the registration department to sign all necessary forms in order for us  to release information regarding their care.   Consent: Patient/Guardian gives verbal consent for treatment and assignment of benefits for services provided during this visit. Patient/Guardian expressed understanding and agreed to proceed.  This note was generated in part or whole with voice recognition software. Voice recognition is usually quite accurate but there are transcription errors that can and very often do occur. I apologize for any typographical errors that  were not detected and corrected.     Charlene Cowdrey, MD 03/19/2024, 7:37 AM

## 2024-03-22 ENCOUNTER — Encounter (INDEPENDENT_AMBULATORY_CARE_PROVIDER_SITE_OTHER): Payer: Self-pay | Admitting: Vascular Surgery

## 2024-03-22 ENCOUNTER — Ambulatory Visit (INDEPENDENT_AMBULATORY_CARE_PROVIDER_SITE_OTHER): Admitting: Vascular Surgery

## 2024-03-22 VITALS — BP 152/88 | HR 92 | Resp 18 | Wt 159.4 lb

## 2024-03-22 DIAGNOSIS — M79669 Pain in unspecified lower leg: Secondary | ICD-10-CM

## 2024-03-22 DIAGNOSIS — I10 Essential (primary) hypertension: Secondary | ICD-10-CM

## 2024-03-22 DIAGNOSIS — M7989 Other specified soft tissue disorders: Secondary | ICD-10-CM | POA: Diagnosis not present

## 2024-03-22 DIAGNOSIS — K5900 Constipation, unspecified: Secondary | ICD-10-CM | POA: Diagnosis not present

## 2024-03-22 NOTE — Progress Notes (Signed)
 Subjective:    Patient ID: Melinda Shaw, female    DOB: 12-09-1965, 58 y.o.   MRN: [id number] Chief Complaint  Patient presents with   New Patient (Initial Visit)    Ref David Escort consult le edema    Melinda Shaw is a 58 yo female who presents to vein and vascular clinic today for chief complaint of bilateral lower extremity swelling with pain.  She endorses the pain is intermittent to her lower extremities either while resting or ambulating.  Describes it sometimes as an aching and throbbing and other times as sharp and shooting.  She endorses her mother had a history of lymphedema and leg swelling and wore compression hose for many years.  Patient endorses her primary care physician had placed her on some Lasix  prior which to help with the swelling but she took herself off of it due to the side effects as she did not want to be on it long-term.  Welling resumed after the Lasix  was stopped.  She endorses she tried compression socks a while back but they were ankle socks not true compression knee-high socks or thigh-high compression.  She presents today hoping we can help resolve some of her leg swelling as she is a very active person [place].    Review of Systems  Constitutional: Negative.   HENT: Negative.    Eyes: Negative.   Respiratory: Negative.    Cardiovascular: Negative.   Gastrointestinal: Negative.   Endocrine: Negative.   Genitourinary: Negative.   Skin: Negative.   Neurological: Negative.   Psychiatric/Behavioral: Negative.         History of PTSD and anxiety       Objective:    Physical Exam Vitals reviewed.  Constitutional:      Appearance: Normal appearance. She is obese.  HENT:     Head: Normocephalic.  Eyes:     Pupils: Pupils are equal, round, and reactive to light.  Cardiovascular:     Rate and Rhythm: Normal rate and regular rhythm.     Pulses: Normal pulses.     Heart sounds: Normal heart sounds.  Pulmonary:     Effort: Pulmonary effort  is normal.     Breath sounds: Normal breath sounds.  Abdominal:     General: Abdomen is flat. Bowel sounds are normal.     Palpations: Abdomen is soft.  Musculoskeletal:     Cervical back: Normal range of motion.     Right lower leg: Edema present.     Left lower leg: Edema present.  Skin:    General: Skin is warm and dry.     Capillary Refill: Capillary refill takes 2 to 3 seconds.  Neurological:     General: No focal deficit present.     Mental Status: She is alert and oriented to person, place, and time. Mental status is at baseline.  Psychiatric:        Mood and Affect: Mood normal.        Behavior: Behavior normal.        Thought Content: Thought content normal.        Judgment: Judgment normal.     BP (!) 152/88   Pulse 92   Resp 18   Wt 159 lb 6.4 oz (72.3 kg)   LMP 09/24/2016 (Approximate)   BMI 31.13 kg/m   Past Medical History:  Diagnosis Date   Anemia    H/O   Anxiety    Bronchitis    Chest pain  a. 09/2018 Cath: Nl cors. EF 65%->Med rx (long-acting nitrate added).   Depression    Diastolic dysfunction    a. 10/2018 Echo: EF 55-60%, no rwma, Gr1DD. Mild MR. Nl RV fxn.   DOE (dyspnea on exertion)    Fibromyalgia    GERD (gastroesophageal reflux disease)    Headache    Hyperlipidemia    Hypertension    no meds   Palpitations    a. 01/2019 [facility]): NSR w/ rare PACs & PVCs; b. 04/2020 [facility]: Predominantly sinus rhythm @ 75 (48-141). Rare PACs/PVCs. No significant arrhythmias or prolonged pauses.  No triggered events.   PONV (postoperative nausea and vomiting)     Social History   Socioeconomic History   Marital status: Married    Spouse name: david   Number of children: 3   Years of education: Not on file   Highest education level: 10th grade  Occupational History   Not on file  Tobacco Use   Smoking status: Never   Smokeless tobacco: Never  Vaping Use   Vaping status: Never Used  Substance and Sexual Activity   Alcohol use: No     Alcohol/week: 0.0 standard drinks of alcohol   Drug use: No   Sexual activity: Yes  Other Topics Concern   Not on file  Social History Narrative   Not on file   Social Drivers of Health   Financial Resource Strain: Low Risk  (12/05/2023)   Overall Financial Resource Strain (CARDIA)    Difficulty of Paying Living Expenses: Not hard at all  Recent Concern: Financial Resource Strain - Medium Risk (10/13/2023)   Received from [facility]   Overall Financial Resource Strain (CARDIA)    Difficulty of Paying Living Expenses: Somewhat hard  Food Insecurity: Food Insecurity Present (12/05/2023)   Hunger Vital Sign    Worried About Running Out of Food in the Last Year: Sometimes true    Ran Out of Food in the Last Year: Sometimes true  Transportation Needs: Unmet Transportation Needs (12/05/2023)   PRAPARE - Transportation    Lack of Transportation (Medical): Yes    Lack of Transportation (Non-Medical): Yes  Physical Activity: Sufficiently Active (12/05/2023)   Exercise Vital Sign    Days of Exercise per Week: 3 days    Minutes of Exercise per Session: 90 min  Stress: Stress Concern Present (12/05/2023)   [employer] of Occupational Health - Occupational Stress Questionnaire    Feeling of Stress : Rather much  Social Connections: Socially Isolated (12/05/2023)   Social Connection and Isolation Panel [NHANES]    Frequency of Communication with Friends and Family: Never    Frequency of Social Gatherings with Friends and Family: Never    Attends Religious Services: Never    Database administrator or Organizations: No    Attends Banker Meetings: Never    Marital Status: Married  Catering manager Violence: Not At Risk (10/21/2023)   Humiliation, Afraid, Rape, and Kick questionnaire    Fear of Current or Ex-Partner: No    Emotionally Abused: No    Physically Abused: No    Sexually Abused: No    Past Surgical History:  Procedure Laterality Date    ANTERIOR CRUCIATE LIGAMENT REPAIR     CARDIAC CATHETERIZATION     CHOLECYSTECTOMY     CORONARY ANGIOPLASTY     ESOPHAGOGASTRODUODENOSCOPY (EGD) WITH PROPOFOL  N/A 08/28/2015   Procedure: ESOPHAGOGASTRODUODENOSCOPY (EGD) WITH PROPOFOL ;  Surgeon: Luella Sager, MD;  Location: [facility] ENDOSCOPY;  Service: Endoscopy;  Laterality: N/A;   ESOPHAGOGASTRODUODENOSCOPY (EGD) WITH PROPOFOL  N/A 01/31/2022   Procedure: ESOPHAGOGASTRODUODENOSCOPY (EGD) WITH PROPOFOL ;  Surgeon: Luke Salaam, MD;  Location: [facility] ENDOSCOPY;  Service: Gastroenterology;  Laterality: N/A;   KNEE SURGERY     left knee    LEFT HEART CATH AND CORONARY ANGIOGRAPHY Left 09/22/2018   Procedure: LEFT HEART CATH AND CORONARY ANGIOGRAPHY;  Surgeon: Sammy Crisp, MD;  Location: [facility] INVASIVE CV LAB;  Service: Cardiovascular;  Laterality: Left;   MASS EXCISION Left 01/26/2016   Procedure: EXCISION OF LEFT WRIST VOLAR MASS;  Surgeon: Florida Hurter, MD;  Location: [facility];  Service: Orthopedics;  Laterality: Left;   NOSE SURGERY     POLYPECTOMY     TUBAL LIGATION     VIDEO BRONCHOSCOPY N/A 05/29/2015   Procedure: VIDEO BRONCHOSCOPY WITHOUT FLUORO;  Surgeon: Laine Piggs, MD;  Location: [facility] ORS;  Service: Cardiopulmonary;  Laterality: N/A;    Family History  Problem Relation Age of Onset   Dementia Mother    Alcohol abuse Mother    COPD Mother    High Cholesterol Mother    Heart failure Mother    Hypertension Mother    Dementia Father    Alcohol abuse Father    Hypertension Father    AAA (abdominal aortic aneurysm) Sister    COPD Sister    High Cholesterol Sister     Allergies  Allergen Reactions   Cherry Swelling    Swelling of lips and throat   Other Shortness Of Breath    Stopped breathing, Swelling of throat, Stopped breathing, Swelling of throat, Stopped breathing, Swelling of throat   Shellfish Allergy Shortness Of Breath   Sulfa Antibiotics Shortness Of Breath    Stopped breathing, Swelling  of throat   Bee Venom     Swelling of throat   Imdur  [Isosorbide  Nitrate]     Worsening headaches        Latest Ref Rng & Units 01/13/2024    2:38 PM 10/01/2023   12:06 PM 09/05/2022    3:10 PM  CBC  WBC 3.4 - 10.8 x10E3/uL 5.6  6.3  6.6   Hemoglobin 11.1 - 15.9 g/dL 16.1  09.6  04.5   Hematocrit 34.0 - 46.6 % 41.3  40.1  40.6   Platelets 150 - 450 x10E3/uL 238  218  240        CMP     Component Value Date/Time   NA 142 03/08/2024 1628   NA 136 02/06/2015 1756   K 3.9 03/08/2024 1628   K 3.1 (L) 02/06/2015 1756   CL 106 03/08/2024 1628   CL 103 02/06/2015 1756   CO2 21 03/08/2024 1628   CO2 22 02/06/2015 1756   GLUCOSE 93 03/08/2024 1628   GLUCOSE 96 10/01/2023 1206   GLUCOSE 136 (H) 02/06/2015 1756   BUN 11 03/08/2024 1628   BUN 14 02/06/2015 1756   CREATININE 0.84 03/08/2024 1628   CREATININE 0.73 02/06/2015 1756   CALCIUM  10.0 03/08/2024 1628   CALCIUM  8.9 02/06/2015 1756   PROT 7.2 03/08/2024 1628   ALBUMIN 4.5 03/08/2024 1628   AST 14 03/08/2024 1628   ALT 18 03/08/2024 1628   ALKPHOS 111 03/08/2024 1628   BILITOT 0.3 03/08/2024 1628   EGFR 80 03/08/2024 1628   GFRNONAA >60 10/13/2023 0950   GFRNONAA >60 02/06/2015 1756     No results found.     Assessment & Plan:   1. Pain and swelling of lower leg,  unspecified laterality (Primary) Recommend:  I have had a long discussion with the patient regarding swelling and why it  causes symptoms.  Patient will begin wearing graduated compression on a daily basis a prescription was given. The patient will  wear the stockings first thing in the morning and removing them in the evening. The patient is instructed specifically not to sleep in the stockings.   In addition, behavioral modification will be initiated.  This will include frequent elevation, use of over the counter pain medications and exercise such as walking.  Consideration for a lymph pump will also be made based upon the effectiveness of  conservative therapy.  This would help to improve the edema control and prevent sequela such as ulcers and infections   Patient should undergo duplex ultrasound of the venous system to ensure that DVT or reflux is not present.  The patient will follow-up with me after the ultrasound.   2. Essential hypertension Continue antihypertensive medications as already ordered, these medications have been reviewed and there are no changes at this time.  3. Constipation, unspecified constipation type Continue constipation medications as already ordered, these medications have been reviewed and there are no changes at this time.   Current Outpatient Medications on File Prior to Visit  Medication Sig Dispense Refill   aspirin  EC 81 MG tablet Take 81 mg by mouth daily.     buPROPion  (WELLBUTRIN  XL) 150 MG 24 hr tablet Take 1 tablet (150 mg total) by mouth daily. 30 tablet 2   clonazePAM  (KLONOPIN ) 0.5 MG tablet Take 0.5 mg by mouth as directed. Takes as needed few times a week only for severe panic attacks     cyanocobalamin (VITAMIN B12) 1000 MCG/ML injection INJECT 1 ML INTO THE MUSCLE EVERY THIRTY DAYS     dicyclomine  (BENTYL ) 10 MG capsule Take 1 capsule (10 mg total) by mouth 3 (three) times daily before meals. 90 capsule 5   diltiazem  (CARDIZEM  CD) 240 MG 24 hr capsule Take 1 capsule (240 mg total) by mouth daily. 90 capsule 3   diltiazem  (CARDIZEM ) 30 MG tablet Take 1 tablet (30 mg total) by mouth daily as needed (palpitations). 90 tablet 0   docusate sodium  (COLACE) 100 MG capsule Take 1 capsule (100 mg total) by mouth 2 (two) times daily as needed for mild constipation. 180 capsule 3   EPINEPHrine  0.3 mg/0.3 mL IJ SOAJ injection Inject 0.3 mg into the muscle as needed for anaphylaxis. 1 each 1   famotidine  (PEPCID ) 20 MG tablet Take 1 tablet (20 mg total) by mouth at bedtime. 90 tablet 1   Galcanezumab-gnlm 120 MG/ML SOAJ Inject into the skin.     hydrOXYzine  (VISTARIL ) 25 MG capsule Take 1  capsule (25 mg total) by mouth 3 (three) times daily as needed for anxiety. And sleep 270 capsule 1   LINZESS  290 MCG CAPS capsule Take 1 capsule (290 mcg total) by mouth daily. 30 capsule 5   naltrexone  (DEPADE) 50 MG tablet Take 0.5 tablets (25 mg total) by mouth daily. 15 tablet 2   nitroGLYCERIN  (NITROSTAT ) 0.4 MG SL tablet PLACE ONE TABLET UNDER THE TONGUE EVERY 5 MINUTES AS NEEDED FOR CHEST PAIN. MAXIMUM OF 3 DOSES. 25 tablet 3   omeprazole  (PRILOSEC) 40 MG capsule TAKE 1 CAPSULE BY MOUTH DAILY 90 capsule 1   ondansetron  (ZOFRAN  ODT) 4 MG disintegrating tablet Take 1 tablet (4 mg total) by mouth every 8 (eight) hours as needed for nausea or vomiting. 12 tablet 0  ondansetron  (ZOFRAN ) 4 MG tablet Take 1 tablet by mouth every 8 (eight) hours as needed.     rosuvastatin  (CRESTOR ) 10 MG tablet Take 1 tablet (10 mg total) by mouth at bedtime. 90 tablet 3   sertraline  (ZOLOFT ) 50 MG tablet Take 50 mg by mouth daily.     tiZANidine  (ZANAFLEX ) 4 MG tablet Take 4 mg by mouth once.     topiramate  (TOPAMAX ) 50 MG tablet Take 3 tablets (150 mg total) by mouth as directed. Take 1 tablet daily at 11 AM and 2 tablets daily at bedtime 90 tablet 2   potassium chloride  SA (KLOR-CON  M20) 20 MEQ tablet Take 1 tablet (20 mEq total) by mouth 2 (two) times daily. 180 tablet 3   No current facility-administered medications on file prior to visit.    There are no Patient Instructions on file for this visit. No follow-ups on file.   Annamaria Barrette, NP

## 2024-03-29 ENCOUNTER — Other Ambulatory Visit (INDEPENDENT_AMBULATORY_CARE_PROVIDER_SITE_OTHER): Payer: Self-pay | Admitting: Vascular Surgery

## 2024-03-29 DIAGNOSIS — M79669 Pain in unspecified lower leg: Secondary | ICD-10-CM

## 2024-03-29 NOTE — Progress Notes (Signed)
 Cardiology Office Note    Date:  03/30/2024   ID:  Melinda Shaw, DOB November 12, 1965, MRN [id number]  PCP:  Melinda Farr, FNP  Cardiologist:  Melinda Crisp, MD  Electrophysiologist:  None   Chief Complaint: Follow up  History of Present Illness:   Melinda Shaw is a 58 y.o. female with history of chest pain with normal coronary arteries by coronary CTA, dyspnea on exertion, palpitations, hypertension, hyperlipidemia, fibromyalgia, PTSD, bronchitis, chronic migraine, and GERD who presents for follow-up of HTN.   Echo in 2016 showed an EF of 60 to 65%, no regional wall motion abnormalities and normal diastolic function.  LHC in 09/2018 showed no angiographically significant CAD with normal LV systolic function and filling pressure.  Echo in 10/2018 showed an EF of 55 to 60%, no regional wall motion abnormalities, grade 1 diastolic dysfunction, mild mitral regurgitation normal RV systolic function and RVSP.  Zio patch in 04/2020 showed a predominant rhythm of sinus with an average rate of 75 bpm with rare PACs and PVCs.  No significant arrhythmia or prolonged pause was observed.  Coronary CTA in 01/2021 showed a calcium  score of 0 with no evidence of CAD.  Zio patch monitoring in 05/2023 showed a predominant rhythm of sinus with 2 episodes of SVT lasting up to 5 beats, and rare PACs and PVCs.  There were 33 patient triggered events, most of which corresponded to sinus rhythm.  A few triggered events also contained PACs.  She was seen in 10/2023 noting concerns about elevated BP readings and medication changes.  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  She was bordering on sepsis and was on "high-powered antibiotics."  In the setting of this, her blood pressures were elevated, prompting [name] to add lisinopril  for blood pressure control.  Ms. Pillars subsequently saw her mental health provider, Dr. Avonne Shaw, who noted negligible increase in  creatinine (0.91->1.03) and recommended cessation of lisinopril  in the setting of long-term lithium  use.  He otherwise suggested weaning off of lithium .  She continued to note elevated readings at her PCP and occasional mild chest tightness that she attributed to stress, similar to prior visits.  Blood pressure in the office was mildly elevated at 156/86.  After discussion with the patient, she was resumed on lisinopril  10 mg daily with follow-up labs noted to have stable renal function and potassium.  She was seen in our office in 11/2023 and continued to note occasional palpitations that were stable.  BP at home range from 115-160s systolic.  She was adherent and tolerating diltiazem  and lisinopril .  She was continued on diltiazem  with titration of lisinopril  to 20 mg daily.  However, several was subsequently held with dizziness and associated hypotension with reported BP of 87/59.  She was seen in the office on 02/18/2024 for dizziness that persisted despite discontinuing lisinopril .  She reported "I am spinning one way and everything else is a spinning the other."  She also reported an episode of syncope on 12/25/2023 that was preceded by dizziness.  BP was stable and she was advised to follow-up with PCP for possible vertigo.  Carotid artery ultrasound unrevealing.  She comes in noting several complaints at this time including ongoing intermittent dizziness, palpitations, weight gain, lower extremity swelling, and elevated BP readings ranging from the 130s to 150s over 80s.  She was evaluated by her PCP for lower extremity swelling and referred to vascular surgery and is  scheduled for venous reflux ultrasound tomorrow.  No significant dietary changes and continues to monitor/minimize sodium intake.   Labs independently reviewed: 01/2024 - TSH normal, magnesium 2.3, Hgb 14.0, PLT 238, BUN 13, serum renin 0.84, potassium 4.4 09/2023 - albumin 4.2, AST/ALT normal 04/2023 - TC 121, TG 81, HDL 52, LDL  53 08/2022 - Z6X 5.6  Past Medical History:  Diagnosis Date   Anemia    H/O   Anxiety    Bronchitis    Chest pain    a. 09/2018 Cath: Nl cors. EF 65%->Med rx (long-acting nitrate added).   Depression    Diastolic dysfunction    a. 10/2018 Echo: EF 55-60%, no rwma, Gr1DD. Mild MR. Nl RV fxn.   DOE (dyspnea on exertion)    Fibromyalgia    GERD (gastroesophageal reflux disease)    Headache    Hyperlipidemia    Hypertension    no meds   Palpitations    a. 01/2019 Z[facility]): NSR w/ rare PACs & PVCs; b. 04/2020 Zio: Predominantly sinus rhythm @ 75 (48-141). Rare PACs/PVCs. No significant arrhythmias or prolonged pauses.  No triggered events.   PONV (postoperative nausea and vomiting)     Past Surgical History:  Procedure Laterality Date   ANTERIOR CRUCIATE LIGAMENT REPAIR     CARDIAC CATHETERIZATION     CHOLECYSTECTOMY     CORONARY ANGIOPLASTY     ESOPHAGOGASTRODUODENOSCOPY (EGD) WITH PROPOFOL  N/A 08/28/2015   Procedure: ESOPHAGOGASTRODUODENOSCOPY (EGD) WITH PROPOFOL ;  Surgeon: Melinda Sager, MD;  Location: [facility] ENDOSCOPY;  Service: Endoscopy;  Laterality: N/A;   ESOPHAGOGASTRODUODENOSCOPY (EGD) WITH PROPOFOL  N/A 01/31/2022   Procedure: ESOPHAGOGASTRODUODENOSCOPY (EGD) WITH PROPOFOL ;  Surgeon: Melinda Salaam, MD;  Location: [facility] ENDOSCOPY;  Service: Gastroenterology;  Laterality: N/A;   KNEE SURGERY     left knee    LEFT HEART CATH AND CORONARY ANGIOGRAPHY Left 09/22/2018   Procedure: LEFT HEART CATH AND CORONARY ANGIOGRAPHY;  Surgeon: Melinda Crisp, MD;  Location: [facility] INVASIVE CV LAB;  Service: Cardiovascular;  Laterality: Left;   MASS EXCISION Left 01/26/2016   Procedure: EXCISION OF LEFT WRIST VOLAR MASS;  Surgeon: Melinda Hurter, MD;  Location: [facility];  Service: Orthopedics;  Laterality: Left;   NOSE SURGERY     POLYPECTOMY     TUBAL LIGATION     VIDEO BRONCHOSCOPY N/A 05/29/2015   Procedure: VIDEO BRONCHOSCOPY WITHOUT FLUORO;  Surgeon: Melinda Piggs, MD;  Location: [facility] ORS;  Service: Cardiopulmonary;  Laterality: N/A;    Current Medications: Current Meds  Medication Sig   aspirin  EC 81 MG tablet Take 81 mg by mouth daily.   buPROPion  (WELLBUTRIN  XL) 150 MG 24 hr tablet Take 1 tablet (150 mg total) by mouth daily.   carvedilol (COREG) 12.5 MG tablet Take 1 tablet (12.5 mg total) by mouth 2 (two) times daily.   clonazePAM  (KLONOPIN ) 0.5 MG tablet Take 0.5 mg by mouth as directed. Takes as needed few times a week only for severe panic attacks   cyanocobalamin (VITAMIN B12) 1000 MCG/ML injection INJECT 1 ML INTO THE MUSCLE EVERY THIRTY DAYS   dicyclomine  (BENTYL ) 10 MG capsule Take 1 capsule (10 mg total) by mouth 3 (three) times daily before meals.   diltiazem  (CARDIZEM ) 30 MG tablet Take 1 tablet (30 mg total) by mouth daily as needed (palpitations).   docusate sodium  (COLACE) 100 MG capsule Take 1 capsule (100 mg total) by mouth 2 (two) times daily as needed for mild constipation.   EPINEPHrine  0.3 mg/0.3 mL IJ  SOAJ injection Inject 0.3 mg into the muscle as needed for anaphylaxis.   famotidine  (PEPCID ) 20 MG tablet Take 1 tablet (20 mg total) by mouth at bedtime.   Galcanezumab-gnlm 120 MG/ML SOAJ Inject into the skin every 30 (thirty) days.   hydrOXYzine  (VISTARIL ) 25 MG capsule Take 1 capsule (25 mg total) by mouth 3 (three) times daily as needed for anxiety. And sleep   LINZESS  290 MCG CAPS capsule Take 1 capsule (290 mcg total) by mouth daily.   naltrexone  (DEPADE) 50 MG tablet Take 0.5 tablets (25 mg total) by mouth daily.   nitroGLYCERIN  (NITROSTAT ) 0.4 MG SL tablet PLACE ONE TABLET UNDER THE TONGUE EVERY 5 MINUTES AS NEEDED FOR CHEST PAIN. MAXIMUM OF 3 DOSES.   omeprazole  (PRILOSEC) 40 MG capsule TAKE 1 CAPSULE BY MOUTH DAILY   ondansetron  (ZOFRAN  ODT) 4 MG disintegrating tablet Take 1 tablet (4 mg total) by mouth every 8 (eight) hours as needed for nausea or vomiting.   ondansetron  (ZOFRAN ) 4 MG tablet Take 1 tablet by  mouth every 8 (eight) hours as needed.   potassium chloride  SA (KLOR-CON  M20) 20 MEQ tablet Take 1 tablet (20 mEq total) by mouth 2 (two) times daily.   rosuvastatin  (CRESTOR ) 10 MG tablet Take 1 tablet (10 mg total) by mouth at bedtime.   sertraline  (ZOLOFT ) 50 MG tablet Take 50 mg by mouth daily.   tiZANidine  (ZANAFLEX ) 4 MG tablet Take 4 mg by mouth once.   topiramate  (TOPAMAX ) 50 MG tablet Take 3 tablets (150 mg total) by mouth as directed. Take 1 tablet daily at 11 AM and 2 tablets daily at bedtime   [DISCONTINUED] diltiazem  (CARDIZEM  CD) 240 MG 24 hr capsule Take 1 capsule (240 mg total) by mouth daily.    Allergies:   Cherry, Other, Shellfish allergy, Sulfa antibiotics, Bee venom, and Imdur  [isosorbide  nitrate]   Social History   Socioeconomic History   Marital status: Married    Spouse name: david   Number of children: 3   Years of education: Not on file   Highest education level: 10th grade  Occupational History   Not on file  Tobacco Use   Smoking status: Never   Smokeless tobacco: Never  Vaping Use   Vaping status: Never Used  Substance and Sexual Activity   Alcohol use: No    Alcohol/week: 0.0 standard drinks of alcohol   Drug use: No   Sexual activity: Yes  Other Topics Concern   Not on file  Social History Narrative   Not on file   Social Drivers of Health   Financial Resource Strain: Low Risk  (12/05/2023)   Overall Financial Resource Strain (CARDIA)    Difficulty of Paying Living Expenses: Not hard at all  Recent Concern: Financial Resource Strain - Medium Risk (10/13/2023)   Received from [facility]   Overall Financial Resource Strain (CARDIA)    Difficulty of Paying Living Expenses: Somewhat hard  Food Insecurity: Food Insecurity Present (12/05/2023)   Hunger Vital Sign    Worried About Running Out of Food in the Last Year: Sometimes true    Ran Out of Food in the Last Year: Sometimes true  Transportation Needs: Unmet Transportation  Needs (12/05/2023)   PRAPARE - Transportation    Lack of Transportation (Medical): Yes    Lack of Transportation (Non-Medical): Yes  Physical Activity: Sufficiently Active (12/05/2023)   Exercise Vital Sign    Days of Exercise per Week: 3 days    Minutes of Exercise  per Session: 90 min  Stress: Stress Concern Present (12/05/2023)   [employer] of Occupational Health - Occupational Stress Questionnaire    Feeling of Stress : Rather much  Social Connections: Socially Isolated (12/05/2023)   Social Connection and Isolation Panel [NHANES]    Frequency of Communication with Friends and Family: Never    Frequency of Social Gatherings with Friends and Family: Never    Attends Religious Services: Never    Database administrator or Organizations: No    Attends Engineer, structural: Never    Marital Status: Married     Family History:  The patient's family history includes AAA (abdominal aortic aneurysm) in her sister; Alcohol abuse in her father and mother; COPD in her mother and sister; Dementia in her father and mother; Heart failure in her mother; High Cholesterol in her mother and sister; Hypertension in her father and mother.  ROS:   12-point review of systems is negative unless otherwise noted in the HPI.   EKGs/Labs/Other Studies Reviewed:    Studies reviewed were summarized above. The additional studies were reviewed today:  Zio patch 05/2023:   The patient was monitored for 13 days, 20 hours.   The predominant rhythm was sinus with an average rate of 70 bpm (range 43-154 bpm).   There were rare PACs and PVCs.   two supraventricular runs were observed, lasting up to 5 beats with a maximum rate of 154 bpm.   No sustained arrhythmia or prolonged pauses occurred.   There were 33 patient triggered events, most of which corresponded to sinus rhythm.  A few triggered events also contained PACs.   Predominantly sinus rhythm with rare PACs and PVCs as well as two brief  supraventricular runs.  No significant arrhythmia identified to explain the patient's symptoms. __________   Coronary CTA 01/11/2021: Aorta:  Normal size.  No calcifications.  No dissection.   Aortic Valve:  Trileaflet.  No calcifications.   Coronary Arteries:  Normal coronary origin.  Right dominance.   RCA is a dominant artery that gives rise to PDA and PLA. There is no plaque.   Left main is a large artery that gives rise to LAD and LCX arteries.   LAD has no plaque or evidence for disease.   LCX is a non-dominant artery that gives rise to two obtuse marginal branches. There is no plaque.   Other findings:   Normal pulmonary vein drainage into the left atrium.   Normal left atrial appendage without a thrombus.   Normal size of the pulmonary artery.   IMPRESSION: 1. Normal coronary calcium  score of 0. Patient is low risk for coronary events. 2. Normal coronary origin with right dominance. 3. No evidence of CAD. __________   Zio patch 04/2020: The patient was monitored for 12 days, 23 hours. The predominant rhythm was sinus with an average rate of 75 bpm (range 48-141 bpm). There were rare PAC's and PVC's. No significant arrhythmia or prolonged pause was observed. There were no patient triggered events.   Predominantly sinus rhythm with rare PAC's and PVC's.  No significant arrhythmia noted. __________   2D echo 10/29/2018: - Left ventricle: The cavity size was normal. Systolic function was    normal. The estimated ejection fraction was in the range of 55%    to 60%. Wall motion was normal; there were no regional wall    motion abnormalities. Doppler parameters are consistent with    abnormal left ventricular relaxation (grade 1 diastolic  dysfunction).  - Mitral valve: There was mild regurgitation.  - Left atrium: The atrium was normal in size.  - Right ventricle: Systolic function was normal.  - Pulmonary arteries: Systolic pressure was within the normal     range.  __________   [facility] 09/22/2018: Conclusions: No angiographically significant coronary artery disease. Normal left ventricular systolic function and filling pressure.   Recommendations:  Continue medical therapy, including propranolol and isosorbide  mononitrate in case there is an element of microvascular dysfunction and/or coronary vasospasm. Primary prevention of coronary artery disease.   No indication for antiplatelet therapy at this time.  __________   2D echo 04/17/2015: - Left ventricle: The cavity size was normal. Wall thickness was    normal. Systolic function was normal. The estimated ejection    fraction was in the range of 60% to 65%. Wall motion was normal;    there were no regional wall motion abnormalities. Left    ventricular diastolic function parameters were normal.   Impressions:   - Normal study.   EKG:  EKG is not ordered today.    Recent Labs: 01/13/2024: Hemoglobin 14.0; Magnesium 2.3; Platelets 238; TSH 1.810 03/08/2024: ALT 18; BUN 11; Creatinine, Ser 0.84; Potassium 3.9; Sodium 142  Recent Lipid Panel    Component Value Date/Time   CHOL 121 04/18/2023 1150   TRIG 81 04/18/2023 1150   HDL 52 04/18/2023 1150   CHOLHDL 2.3 04/18/2023 1150   VLDL 16 04/18/2023 1150   LDLCALC 53 04/18/2023 1150    PHYSICAL EXAM:    VS:  BP 135/86   Pulse 63   Ht 5' (1.524 m)   Wt 159 lb 6.4 oz (72.3 kg)   LMP 09/24/2016 (Approximate)   SpO2 99%   BMI 31.13 kg/m   BMI: Body mass index is 31.13 kg/m.  Physical Exam Constitutional:      Appearance: She is well-developed.  HENT:     Head: Normocephalic and atraumatic.  Eyes:     General:        Right eye: No discharge.        Left eye: No discharge.  Cardiovascular:     Rate and Rhythm: Normal rate and regular rhythm.     Pulses:          Posterior tibial pulses are 2+ on the right side and 2+ on the left side.     Heart sounds: Normal heart sounds, S1 normal and S2 normal. Heart sounds not distant.  No midsystolic click and no opening snap. No murmur heard.    No friction rub.  Pulmonary:     Effort: Pulmonary effort is normal. No respiratory distress.     Breath sounds: Normal breath sounds. No decreased breath sounds, wheezing, rhonchi or rales.  Chest:     Chest wall: No tenderness.  Musculoskeletal:     Cervical back: Normal range of motion.     Comments: Mild bilateral pretibial nonpitting edema.  Skin:    General: Skin is warm and dry.     Nails: There is no clubbing.  Neurological:     Mental Status: She is alert and oriented to person, place, and time.  Psychiatric:        Speech: Speech normal.        Behavior: Behavior normal.        Thought Content: Thought content normal.        Judgment: Judgment normal.     Wt Readings from Last 3 Encounters:  03/30/24 159 lb  6.4 oz (72.3 kg)  03/22/24 159 lb 6.4 oz (72.3 kg)  03/08/24 162 lb 8 oz (73.7 kg)     ASSESSMENT & PLAN:   HTN: Blood pressure continues to fluctuate in the 130s to 150s over 80s.  We are discontinuing diltiazem  given lower extremity swelling and initiating carvedilol 12.5 mg twice daily.  No longer on lisinopril  secondary to hypotension with associated dizziness.  Escalate pharmacotherapy as indicated.  Palpitations with dizziness: Continues to note ongoing palpitations and dizziness with prior cardiac monitoring showing no significant sustained arrhythmias, prolonged pauses, or evidence of high-grade AV block.  However, she is now experiencing dizziness that has persisted since titration of lisinopril  last year.  Given this, we will repeat outpatient cardiac monitoring on carvedilol 12.5 mg twice daily and off diltiazem .  Update echo to evaluate for new cardiomyopathy.  Lower extremity swelling: Possibly exacerbated by calcium  channel blocker usage.  Discontinue diltiazem  as outlined above.  She is undergoing venous reflux imaging through vascular surgery tomorrow.  May need to consider compression socks.   We will also update an echo to ensure no new cardiomyopathy given progression of his symptoms.  Would defer addition of a standing loop diuretic at this time.  Continue to minimize his sodium intake.  History of chest pain: Reassuring cardiac workup including LHC in 2019 that showed no angiographically significant CAD and coronary CTA in 2022 that showed a calcium  score of 0 and no evidence of CAD.   Disposition: F/u with Dr. Nolan Battle in 3 months.   Medication Adjustments/Labs and Tests Ordered: Current medicines are reviewed at length with the patient today.  Concerns regarding medicines are outlined above. Medication changes, Labs and Tests ordered today are summarized above and listed in the Patient Instructions accessible in Encounters.   Signed, Varney Gentleman, PA-C 03/30/2024 5:20 PM     [facility] [place] [address] [place], [place] [address] [phone]

## 2024-03-30 ENCOUNTER — Encounter: Payer: Self-pay | Admitting: Physician Assistant

## 2024-03-30 ENCOUNTER — Encounter (INDEPENDENT_AMBULATORY_CARE_PROVIDER_SITE_OTHER): Payer: Self-pay

## 2024-03-30 ENCOUNTER — Ambulatory Visit

## 2024-03-30 ENCOUNTER — Ambulatory Visit: Attending: Physician Assistant | Admitting: Physician Assistant

## 2024-03-30 VITALS — BP 135/86 | HR 63 | Ht 60.0 in | Wt 159.4 lb

## 2024-03-30 DIAGNOSIS — R002 Palpitations: Secondary | ICD-10-CM

## 2024-03-30 DIAGNOSIS — M7989 Other specified soft tissue disorders: Secondary | ICD-10-CM

## 2024-03-30 DIAGNOSIS — R42 Dizziness and giddiness: Secondary | ICD-10-CM | POA: Diagnosis not present

## 2024-03-30 DIAGNOSIS — I10 Essential (primary) hypertension: Secondary | ICD-10-CM

## 2024-03-30 DIAGNOSIS — R0789 Other chest pain: Secondary | ICD-10-CM

## 2024-03-30 MED ORDER — CARVEDILOL 12.5 MG PO TABS: 12.5000 mg | ORAL_TABLET | Freq: Two times a day (BID) | ORAL | 3 refills | Status: DC

## 2024-03-30 NOTE — Patient Instructions (Signed)
 Medication Instructions:  Your physician recommends the following medication changes.  STOP TAKING: Long acting Diltiazem  240 mg   START TAKING: COREG 12.5 MG TWICE DAILY   *If you need a refill on your cardiac medications before your next appointment, please call your pharmacy*  Lab Work: No labs ordered today  If you have labs (blood work) drawn today and your tests are completely normal, you will receive your results only by: MyChart Message (if you have MyChart) OR A paper copy in the mail If you have any lab test that is abnormal or we need to change your treatment, we will call you to review the results.  Testing/Procedures: Your physician has requested that you have an echocardiogram. Echocardiography is a painless test that uses sound waves to create images of your heart. It provides your doctor with information about the size and shape of your heart and how well your heart's chambers and valves are working.   You may receive an ultrasound enhancing agent through an IV if needed to better visualize your heart during the echo. This procedure takes approximately one hour.  There are no restrictions for this procedure.  This will take place at [address] [facility]) [address], [place] [address]  Please note: We ask at that you not bring children with you during ultrasound (echo/ vascular) testing. Due to room size and safety concerns, children are not allowed in the ultrasound rooms during exams. Our front office staff cannot provide observation of children in our lobby area while testing is being conducted. An adult accompanying a patient to their appointment will only be allowed in the ultrasound room at the discretion of the ultrasound technician under special circumstances. We apologize for any inconvenience.   ZIO XT- Long Term Monitor Instructions  Your physician has requested you wear a ZIO patch monitor for 14 days.  This is a single patch monitor. [employer]  supplies one patch monitor per enrollment. Additional stickers are not available. Please do not apply patch if you will be having a Nuclear Stress Test,  Echocardiogram, Cardiac CT, MRI, or Chest Xray during the period you would be wearing the  monitor. The patch cannot be worn during these tests. You cannot remove and re-apply the  ZIO XT patch monitor.  Your ZIO patch monitor will be mailed 3 day USPS to your address on file. It may take 3-5 days  to receive your monitor after you have been enrolled.  Once you have received your monitor, please review the enclosed instructions. Your monitor  has already been registered assigning a specific monitor serial # to you.  Billing and Patient Assistance Program Information  We have supplied [employer] with any of your insurance information on file for billing purposes. [employer] offers a sliding scale Patient Assistance Program for patients that do not have  insurance, or whose insurance does not completely cover the cost of the ZIO monitor.  You must apply for the Patient Assistance Program to qualify for this discounted rate.  To apply, please call [employer] at [phone], select option 4, select option 2, ask to apply for  Patient Assistance Program. Sanna Crystal will ask your household income, and how many people  are in your household. They will quote your out-of-pocket cost based on that information.  [employer] will also be able to set up a 43-month, interest-free payment plan if needed.  Applying the monitor   Shave hair from upper left chest.  Hold abrader disc by orange tab. Rub abrader  in 40 strokes over the upper left chest as  indicated in your monitor instructions.  Clean area with 4 enclosed alcohol pads. Let dry.  Apply patch as indicated in monitor instructions. Patch will be placed under collarbone on left  side of chest with arrow pointing upward.  Rub patch adhesive wings for 2 minutes. Remove white label marked "1". Remove the white   label marked "2". Rub patch adhesive wings for 2 additional minutes.  While looking in a mirror, press and release button in center of patch. A small green light will  flash 3-4 times. This will be your only indicator that the monitor has been turned on.  Do not shower for the first 24 hours. You may shower after the first 24 hours.  Press the button if you feel a symptom. You will hear a small click. Record Date, Time and  Symptom in the Patient Logbook.  When you are ready to remove the patch, follow instructions on the last 2 pages of Patient  Logbook. Stick patch monitor onto the last page of Patient Logbook.  Place Patient Logbook in the blue and white box. Use locking tab on box and tape box closed  securely. The blue and white box has prepaid postage on it. Please place it in the mailbox as  soon as possible. Your physician should have your test results approximately 7 days after the  monitor has been mailed back to [facility].  Call [facility] Customer Care at [phone] if you have questions regarding  your ZIO XT patch monitor. Call them immediately if you see an orange light blinking on your  monitor.  If your monitor falls off in less than 4 days, contact our Monitor department at [phone].  If your monitor becomes loose or falls off after 4 days call [employer] at [phone] for  suggestions on securing your monitor   Follow-Up: At [facility], you and your health needs are our priority.  As part of our continuing mission to provide you with exceptional heart care, our providers are all part of one team.  This team includes your primary Cardiologist (physician) and Advanced Practice Providers or APPs (Physician Assistants and Nurse Practitioners) who all work together to provide you with the care you need, when you need it.  Your next appointment:   2 - 3 month(s)  Provider:   Sammy Crisp, MD

## 2024-03-31 ENCOUNTER — Ambulatory Visit (INDEPENDENT_AMBULATORY_CARE_PROVIDER_SITE_OTHER)

## 2024-03-31 ENCOUNTER — Encounter (INDEPENDENT_AMBULATORY_CARE_PROVIDER_SITE_OTHER): Payer: Self-pay | Admitting: Vascular Surgery

## 2024-03-31 ENCOUNTER — Encounter: Payer: Self-pay | Admitting: Internal Medicine

## 2024-03-31 ENCOUNTER — Encounter (INDEPENDENT_AMBULATORY_CARE_PROVIDER_SITE_OTHER)

## 2024-03-31 ENCOUNTER — Ambulatory Visit (INDEPENDENT_AMBULATORY_CARE_PROVIDER_SITE_OTHER): Admitting: Vascular Surgery

## 2024-03-31 VITALS — BP 133/87 | HR 69 | Resp 16 | Wt 157.6 lb

## 2024-03-31 DIAGNOSIS — I10 Essential (primary) hypertension: Secondary | ICD-10-CM

## 2024-03-31 DIAGNOSIS — M79669 Pain in unspecified lower leg: Secondary | ICD-10-CM | POA: Diagnosis not present

## 2024-03-31 DIAGNOSIS — M7989 Other specified soft tissue disorders: Secondary | ICD-10-CM

## 2024-04-01 ENCOUNTER — Encounter (INDEPENDENT_AMBULATORY_CARE_PROVIDER_SITE_OTHER): Payer: Self-pay | Admitting: Vascular Surgery

## 2024-04-01 NOTE — Progress Notes (Signed)
 Subjective:    Patient ID: Melinda Shaw, female    DOB: Jun 16, 1966, 58 y.o.   MRN: [id number] Chief Complaint  Patient presents with   Follow-up    Pt conv ble reflux     Melinda Shaw is a 58 yo female who returns to clinic today for follow up after having completed bilateral lower extremity venous reflux studies. These studies show no reflux, no superficial venous thrombosis or deep vein thrombosis.  Patient states that she went to see her cardiologist Dr. Veryl Gottron End and he has readjusted some of her medicines specifically her calcium  channel blockers.  She was told that this may be the contributing factor to her bilateral lower extremity leg swelling.  We discussed in detail today results of her venous reflux studies.  She would like to move forward with seeing how her lower extremities do with these medication changes.  She has stated that she did not start any of the conservative measures such as rest elevation compression and exercise to help alleviate her leg swelling.  I encouraged her to do both at the same time to get the maximum results to help her feel better.  She verbalized her understanding.    Review of Systems  Constitutional: Negative.   HENT: Negative.    Respiratory: Negative.    Cardiovascular: Negative.   Gastrointestinal: Negative.   Genitourinary: Negative.   Skin:  Positive for color change.  Neurological: Negative.   All other systems reviewed and are negative.      Objective:    Physical Exam Vitals reviewed.  Constitutional:      Appearance: Normal appearance. She is obese.  HENT:     Head: Normocephalic.  Eyes:     Pupils: Pupils are equal, round, and reactive to light.  Cardiovascular:     Rate and Rhythm: Normal rate and regular rhythm.     Pulses: Normal pulses.     Heart sounds: Normal heart sounds.  Pulmonary:     Effort: Pulmonary effort is normal.     Breath sounds: Normal breath sounds.  Abdominal:     General: Abdomen is  flat. Bowel sounds are normal.     Palpations: Abdomen is soft.  Musculoskeletal:        General: Normal range of motion.  Skin:    General: Skin is warm and dry.  Neurological:     General: No focal deficit present.     Mental Status: She is alert and oriented to person, place, and time. Mental status is at baseline.  Psychiatric:        Mood and Affect: Mood normal.        Behavior: Behavior normal.        Thought Content: Thought content normal.        Judgment: Judgment normal.     BP 133/87   Pulse 69   Resp 16   Wt 157 lb 9.6 oz (71.5 kg)   LMP 09/24/2016 (Approximate)   BMI 30.78 kg/m   Past Medical History:  Diagnosis Date   Anemia    H/O   Anxiety    Bronchitis    Chest pain    a. 09/2018 Cath: Nl cors. EF 65%->Med rx (long-acting nitrate added).   Depression    Diastolic dysfunction    a. 10/2018 Echo: EF 55-60%, no rwma, Gr1DD. Mild MR. Nl RV fxn.   DOE (dyspnea on exertion)    Fibromyalgia    GERD (gastroesophageal reflux disease)  Headache    Hyperlipidemia    Hypertension    no meds   Palpitations    a. 01/2019 Zio [place]): NSR w/ rare PACs & PVCs; b. 04/2020 [facility]io: Predominantly sinus rhythm @ 75 (48-141). Rare PACs/PVCs. No significant arrhythmias or prolonged pauses.  No triggered events.   PONV (postoperative nausea and vomiting)     Social History   Socioeconomic History   Marital status: Married    Spouse name: david   Number of children: 3   Years of education: Not on file   Highest education level: 10th grade  Occupational History   Not on file  Tobacco Use   Smoking status: Never   Smokeless tobacco: Never  Vaping Use   Vaping status: Never Used  Substance and Sexual Activity   Alcohol use: No    Alcohol/week: 0.0 standard drinks of alcohol   Drug use: No   Sexual activity: Yes  Other Topics Concern   Not on file  Social History Narrative   Not on file   Social Drivers of Health   Financial Resource Strain: Low Risk   (12/05/2023)   Overall Financial Resource Strain (CARDIA)    Difficulty of Paying Living Expenses: Not hard at all  Recent Concern: Financial Resource Strain - Medium Risk (10/13/2023)   Received from [facility]   Overall Financial Resource Strain (CARDIA)    Difficulty of Paying Living Expenses: Somewhat hard  Food Insecurity: Food Insecurity Present (12/05/2023)   Hunger Vital Sign    Worried About Running Out of Food in the Last Year: Sometimes true    Ran Out of Food in the Last Year: Sometimes true  Transportation Needs: Unmet Transportation Needs (12/05/2023)   PRAPARE - Transportation    Lack of Transportation (Medical): Yes    Lack of Transportation (Non-Medical): Yes  Physical Activity: Sufficiently Active (12/05/2023)   Exercise Vital Sign    Days of Exercise per Week: 3 days    Minutes of Exercise per Session: 90 min  Stress: Stress Concern Present (12/05/2023)   [employer] of Occupational Health - Occupational Stress Questionnaire    Feeling of Stress : Rather much  Social Connections: Socially Isolated (12/05/2023)   Social Connection and Isolation Panel [NHANES]    Frequency of Communication with Friends and Family: Never    Frequency of Social Gatherings with Friends and Family: Never    Attends Religious Services: Never    Database administrator or Organizations: No    Attends Banker Meetings: Never    Marital Status: Married  Catering manager Violence: Not At Risk (10/21/2023)   Humiliation, Afraid, Rape, and Kick questionnaire    Fear of Current or Ex-Partner: No    Emotionally Abused: No    Physically Abused: No    Sexually Abused: No    Past Surgical History:  Procedure Laterality Date   ANTERIOR CRUCIATE LIGAMENT REPAIR     CARDIAC CATHETERIZATION     CHOLECYSTECTOMY     CORONARY ANGIOPLASTY     ESOPHAGOGASTRODUODENOSCOPY (EGD) WITH PROPOFOL  N/A 08/28/2015   Procedure: ESOPHAGOGASTRODUODENOSCOPY (EGD) WITH PROPOFOL ;   Surgeon: Luella Sager, MD;  Location: [facility] ENDOSCOPY;  Service: Endoscopy;  Laterality: N/A;   ESOPHAGOGASTRODUODENOSCOPY (EGD) WITH PROPOFOL  N/A 01/31/2022   Procedure: ESOPHAGOGASTRODUODENOSCOPY (EGD) WITH PROPOFOL ;  Surgeon: Luke Salaam, MD;  Location: [facility] ENDOSCOPY;  Service: Gastroenterology;  Laterality: N/A;   KNEE SURGERY     left knee    LEFT HEART CATH AND CORONARY ANGIOGRAPHY  Left 09/22/2018   Procedure: LEFT HEART CATH AND CORONARY ANGIOGRAPHY;  Surgeon: Sammy Crisp, MD;  Location: [facility] INVASIVE CV LAB;  Service: Cardiovascular;  Laterality: Left;   MASS EXCISION Left 01/26/2016   Procedure: EXCISION OF LEFT WRIST VOLAR MASS;  Surgeon: Florida Hurter, MD;  Location: [facility];  Service: Orthopedics;  Laterality: Left;   NOSE SURGERY     POLYPECTOMY     TUBAL LIGATION     VIDEO BRONCHOSCOPY N/A 05/29/2015   Procedure: VIDEO BRONCHOSCOPY WITHOUT FLUORO;  Surgeon: Laine Piggs, MD;  Location: [facility] ORS;  Service: Cardiopulmonary;  Laterality: N/A;    Family History  Problem Relation Age of Onset   Dementia Mother    Alcohol abuse Mother    COPD Mother    High Cholesterol Mother    Heart failure Mother    Hypertension Mother    Dementia Father    Alcohol abuse Father    Hypertension Father    AAA (abdominal aortic aneurysm) Sister    COPD Sister    High Cholesterol Sister     Allergies  Allergen Reactions   Cherry Swelling    Swelling of lips and throat   Other Shortness Of Breath    Stopped breathing, Swelling of throat, Stopped breathing, Swelling of throat, Stopped breathing, Swelling of throat   Shellfish Allergy Shortness Of Breath   Sulfa Antibiotics Shortness Of Breath    Stopped breathing, Swelling of throat   Bee Venom     Swelling of throat   Imdur  [Isosorbide  Nitrate]     Worsening headaches        Latest Ref Rng & Units 01/13/2024    2:38 PM 10/01/2023   12:06 PM 09/05/2022    3:10 PM  CBC  WBC 3.4 - 10.8 x10E3/uL 5.6   6.3  6.6   Hemoglobin 11.1 - 15.9 g/dL 40.9  81.1  91.4   Hematocrit 34.0 - 46.6 % 41.3  40.1  40.6   Platelets 150 - 450 x10E3/uL 238  218  240        CMP     Component Value Date/Time   NA 142 03/08/2024 1628   NA 136 02/06/2015 1756   K 3.9 03/08/2024 1628   K 3.1 (L) 02/06/2015 1756   CL 106 03/08/2024 1628   CL 103 02/06/2015 1756   CO2 21 03/08/2024 1628   CO2 22 02/06/2015 1756   GLUCOSE 93 03/08/2024 1628   GLUCOSE 96 10/01/2023 1206   GLUCOSE 136 (H) 02/06/2015 1756   BUN 11 03/08/2024 1628   BUN 14 02/06/2015 1756   CREATININE 0.84 03/08/2024 1628   CREATININE 0.73 02/06/2015 1756   CALCIUM  10.0 03/08/2024 1628   CALCIUM  8.9 02/06/2015 1756   PROT 7.2 03/08/2024 1628   ALBUMIN 4.5 03/08/2024 1628   AST 14 03/08/2024 1628   ALT 18 03/08/2024 1628   ALKPHOS 111 03/08/2024 1628   BILITOT 0.3 03/08/2024 1628   EGFR 80 03/08/2024 1628   GFRNONAA >60 10/13/2023 0950   GFRNONAA >60 02/06/2015 1756     No results found.     Assessment & Plan:   1. Pain and swelling of lower leg, unspecified laterality (Primary) Patient returns to clinic today for follow-up visit after completing bilateral lower extremity venous ultrasounds for reflux and DVTs.  The studies today were all negative.  Patient endorses that she had gone back to see Dr. And her cardiologist who made some changes to her calcium  channel blocker medications to  help control swelling in her lower extremities.  She endorses that she has not started any of the conventional methods to help control bilateral lower extremity swelling as of yet.  She now wishes to see if the change in her medications may affect her swelling.  I tried to impress upon Ms. Poteet that she should try to do both together to get the maximal effect of the therapy via medication and conventional methods.  She verbalizes her understanding.  No follow-up was scheduled at this time.  She would like to follow-up as needed.  2. Essential  hypertension Continue antihypertensive medications as already ordered, these medications have been reviewed and there are no changes at this time.   Current Outpatient Medications on File Prior to Visit  Medication Sig Dispense Refill   aspirin  EC 81 MG tablet Take 81 mg by mouth daily.     buPROPion  (WELLBUTRIN  XL) 150 MG 24 hr tablet Take 1 tablet (150 mg total) by mouth daily. 30 tablet 2   carvedilol (COREG) 12.5 MG tablet Take 1 tablet (12.5 mg total) by mouth 2 (two) times daily. 180 tablet 3   clonazePAM  (KLONOPIN ) 0.5 MG tablet Take 0.5 mg by mouth as directed. Takes as needed few times a week only for severe panic attacks     cyanocobalamin (VITAMIN B12) 1000 MCG/ML injection INJECT 1 ML INTO THE MUSCLE EVERY THIRTY DAYS     dicyclomine  (BENTYL ) 10 MG capsule Take 1 capsule (10 mg total) by mouth 3 (three) times daily before meals. 90 capsule 5   diltiazem  (CARDIZEM ) 30 MG tablet Take 30 mg by mouth as needed. As needed for (palpations)     docusate sodium  (COLACE) 100 MG capsule Take 1 capsule (100 mg total) by mouth 2 (two) times daily as needed for mild constipation. 180 capsule 3   EPINEPHrine  0.3 mg/0.3 mL IJ SOAJ injection Inject 0.3 mg into the muscle as needed for anaphylaxis. 1 each 1   famotidine  (PEPCID ) 20 MG tablet Take 1 tablet (20 mg total) by mouth at bedtime. 90 tablet 1   Galcanezumab-gnlm 120 MG/ML SOAJ Inject into the skin every 30 (thirty) days.     hydrOXYzine  (VISTARIL ) 25 MG capsule Take 1 capsule (25 mg total) by mouth 3 (three) times daily as needed for anxiety. And sleep 270 capsule 1   LINZESS  290 MCG CAPS capsule Take 1 capsule (290 mcg total) by mouth daily. 30 capsule 5   naltrexone  (DEPADE) 50 MG tablet Take 0.5 tablets (25 mg total) by mouth daily. 15 tablet 2   nitroGLYCERIN  (NITROSTAT ) 0.4 MG SL tablet PLACE ONE TABLET UNDER THE TONGUE EVERY 5 MINUTES AS NEEDED FOR CHEST PAIN. MAXIMUM OF 3 DOSES. 25 tablet 3   omeprazole  (PRILOSEC) 40 MG capsule TAKE 1  CAPSULE BY MOUTH DAILY 90 capsule 1   ondansetron  (ZOFRAN  ODT) 4 MG disintegrating tablet Take 1 tablet (4 mg total) by mouth every 8 (eight) hours as needed for nausea or vomiting. 12 tablet 0   ondansetron  (ZOFRAN ) 4 MG tablet Take 1 tablet by mouth every 8 (eight) hours as needed.     potassium chloride  SA (KLOR-CON  M20) 20 MEQ tablet Take 1 tablet (20 mEq total) by mouth 2 (two) times daily. 180 tablet 3   rosuvastatin  (CRESTOR ) 10 MG tablet Take 1 tablet (10 mg total) by mouth at bedtime. 90 tablet 3   sertraline  (ZOLOFT ) 50 MG tablet Take 50 mg by mouth daily.     tiZANidine  (ZANAFLEX ) 4 MG tablet  Take 4 mg by mouth once.     topiramate  (TOPAMAX ) 50 MG tablet Take 3 tablets (150 mg total) by mouth as directed. Take 1 tablet daily at 11 AM and 2 tablets daily at bedtime 90 tablet 2   diltiazem  (CARDIZEM ) 30 MG tablet Take 1 tablet (30 mg total) by mouth daily as needed (palpitations). (Patient not taking: Reported on 03/31/2024) 90 tablet 0   No current facility-administered medications on file prior to visit.    There are no Patient Instructions on file for this visit. No follow-ups on file.   Annamaria Barrette, NP

## 2024-04-02 ENCOUNTER — Ambulatory Visit: Attending: Physician Assistant

## 2024-04-02 DIAGNOSIS — R42 Dizziness and giddiness: Secondary | ICD-10-CM

## 2024-04-02 LAB — ECHOCARDIOGRAM COMPLETE
AR max vel: 2.21 cm2
AV Area VTI: 2.15 cm2
AV Area mean vel: 2.09 cm2
AV Mean grad: 4 mmHg
AV Peak grad: 6.5 mmHg
Ao pk vel: 1.27 m/s
Area-P 1/2: 3.08 cm2
Calc EF: 66.1 %
S' Lateral: 2.3 cm
Single Plane A2C EF: 71.1 %
Single Plane A4C EF: 61 %

## 2024-04-03 ENCOUNTER — Ambulatory Visit: Payer: Self-pay | Admitting: Physician Assistant

## 2024-04-07 ENCOUNTER — Ambulatory Visit: Payer: Self-pay | Admitting: Family Medicine

## 2024-04-08 ENCOUNTER — Other Ambulatory Visit: Payer: Self-pay | Admitting: Internal Medicine

## 2024-04-20 ENCOUNTER — Other Ambulatory Visit: Payer: Self-pay | Admitting: Family Medicine

## 2024-04-20 DIAGNOSIS — Z1231 Encounter for screening mammogram for malignant neoplasm of breast: Secondary | ICD-10-CM

## 2024-04-21 ENCOUNTER — Encounter

## 2024-04-23 ENCOUNTER — Encounter

## 2024-04-26 DIAGNOSIS — R002 Palpitations: Secondary | ICD-10-CM

## 2024-04-30 ENCOUNTER — Other Ambulatory Visit: Payer: Self-pay | Admitting: Psychiatry

## 2024-04-30 ENCOUNTER — Ambulatory Visit
Admission: RE | Admit: 2024-04-30 | Discharge: 2024-04-30 | Disposition: A | Discharge status: Home / Self Care | Source: Ambulatory Visit | Attending: Family Medicine | Admitting: Family Medicine

## 2024-04-30 DIAGNOSIS — Z1231 Encounter for screening mammogram for malignant neoplasm of breast: Secondary | ICD-10-CM | POA: Diagnosis present

## 2024-04-30 DIAGNOSIS — F4310 Post-traumatic stress disorder, unspecified: Secondary | ICD-10-CM

## 2024-04-30 DIAGNOSIS — F3341 Major depressive disorder, recurrent, in partial remission: Secondary | ICD-10-CM

## 2024-05-05 ENCOUNTER — Ambulatory Visit: Payer: Self-pay | Admitting: Family Medicine

## 2024-05-11 ENCOUNTER — Other Ambulatory Visit: Payer: Self-pay | Admitting: Psychiatry

## 2024-05-11 DIAGNOSIS — F411 Generalized anxiety disorder: Secondary | ICD-10-CM

## 2024-05-18 ENCOUNTER — Encounter: Payer: Self-pay | Admitting: Podiatry

## 2024-05-18 ENCOUNTER — Ambulatory Visit (INDEPENDENT_AMBULATORY_CARE_PROVIDER_SITE_OTHER)

## 2024-05-18 ENCOUNTER — Ambulatory Visit: Admitting: Podiatry

## 2024-05-18 ENCOUNTER — Ambulatory Visit

## 2024-05-18 VITALS — Ht 60.0 in | Wt 157.6 lb

## 2024-05-18 DIAGNOSIS — R200 Anesthesia of skin: Secondary | ICD-10-CM | POA: Diagnosis not present

## 2024-05-18 DIAGNOSIS — M7752 Other enthesopathy of left foot: Secondary | ICD-10-CM | POA: Diagnosis not present

## 2024-05-18 DIAGNOSIS — M7751 Other enthesopathy of right foot: Secondary | ICD-10-CM

## 2024-05-18 DIAGNOSIS — Z8739 Personal history of other diseases of the musculoskeletal system and connective tissue: Secondary | ICD-10-CM | POA: Diagnosis not present

## 2024-05-18 DIAGNOSIS — R202 Paresthesia of skin: Secondary | ICD-10-CM | POA: Diagnosis not present

## 2024-05-18 MED ORDER — LIDOCAINE 5 % EX OINT: 1.0000 | TOPICAL_OINTMENT | CUTANEOUS | 0 refills | Status: DC | PRN
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
## 2024-05-28 ENCOUNTER — Other Ambulatory Visit: Payer: Self-pay | Admitting: Family Medicine

## 2024-05-31 NOTE — Telephone Encounter (Signed)
 Requested Prescriptions  Pending Prescriptions Disp Refills   buPROPion  (WELLBUTRIN  XL) 150 MG 24 hr tablet [Pharmacy Med Name: buPROPion  HCL XL 150 MG TABLET] 90 tablet 0    Sig: TAKE 1 TABLET BY MOUTH DAILY     Psychiatry: Antidepressants - bupropion  Passed - 05/31/2024  4:15 PM      Passed - Cr in normal range and within 360 days    Creatinine  Date Value Ref Range Status  02/06/2015 0.73 mg/dL Final    Comment:    9.55-8.99 NOTE: New Reference Range  01/17/15    Creatinine, Ser  Date Value Ref Range Status  03/08/2024 0.84 0.57 - 1.00 mg/dL Final         Passed - AST in normal range and within 360 days    AST  Date Value Ref Range Status  03/08/2024 14 0 - 40 IU/L Final         Passed - ALT in normal range and within 360 days    ALT  Date Value Ref Range Status  03/08/2024 18 0 - 32 IU/L Final         Passed - Completed PHQ-2 or PHQ-9 in the last 360 days      Passed - Last BP in normal range    BP Readings from Last 1 Encounters:  03/31/24 133/87         Passed - Valid encounter within last 6 months    Recent Outpatient Visits           2 months ago Lower extremity edema   [facility] Bee, Jon HERO, MD       Future Appointments             In 1 month End, Lonni, MD [facility]

## 2024-06-01 ENCOUNTER — Telehealth: Payer: Self-pay | Admitting: Family Medicine

## 2024-06-01 NOTE — Telephone Encounter (Signed)
 [facility] faxed refill request for the following medications:   busPIRone  (BUSPAR ) 10 MG tablet   Not on current med list   omeprazole  (PRILOSEC) 40 MG capsule   Please advise.

## 2024-06-02 ENCOUNTER — Other Ambulatory Visit: Payer: Self-pay

## 2024-06-02 DIAGNOSIS — K2100 Gastro-esophageal reflux disease with esophagitis, without bleeding: Secondary | ICD-10-CM

## 2024-06-02 MED ORDER — OMEPRAZOLE 40 MG PO CPDR: 40.0000 mg | DELAYED_RELEASE_CAPSULE | Freq: Every day | ORAL | 1 refills | Status: DC

## 2024-06-02 NOTE — Telephone Encounter (Signed)
 [facility] faxed refill request for the following medications:    busPIRone  (BUSPAR ) 10 MG tablet   Not on current med list

## 2024-06-02 NOTE — Telephone Encounter (Signed)
 Converted into a refill request

## 2024-06-03 ENCOUNTER — Telehealth: Payer: Self-pay

## 2024-06-03 NOTE — Telephone Encounter (Signed)
 PA for lidocaine  5% topical ointment was denied.

## 2024-06-11 ENCOUNTER — Other Ambulatory Visit: Payer: Self-pay | Admitting: Family Medicine

## 2024-06-21 ENCOUNTER — Telehealth: Admitting: Psychiatry

## 2024-06-21 ENCOUNTER — Encounter: Payer: Self-pay | Admitting: Psychiatry

## 2024-06-21 DIAGNOSIS — F4310 Post-traumatic stress disorder, unspecified: Secondary | ICD-10-CM

## 2024-06-21 DIAGNOSIS — F3341 Major depressive disorder, recurrent, in partial remission: Secondary | ICD-10-CM | POA: Diagnosis not present

## 2024-06-21 DIAGNOSIS — F429 Obsessive-compulsive disorder, unspecified: Secondary | ICD-10-CM

## 2024-06-21 MED ORDER — BUPROPION HCL ER (SR) 100 MG PO TB12: 100.0000 mg | ORAL_TABLET | Freq: Two times a day (BID) | ORAL | 0 refills | Status: DC

## 2024-06-21 NOTE — Progress Notes (Signed)
 Virtual Visit via Video Note  I connected with Melinda Shaw on 06/21/24 at  4:00 PM EDT by a video enabled telemedicine application and verified that I am speaking with the correct person using two identifiers.  Location Provider Location : [facility] Patient Location :[facility]  Participants: Patient , Provider    I discussed the limitations of evaluation and management by telemedicine and the availability of in person appointments. The patient expressed understanding and agreed to proceed.   I discussed the assessment and treatment plan with the patient. The patient was provided an opportunity to ask questions and all were answered. The patient agreed with the plan and demonstrated an understanding of the instructions.   The patient was advised to call back or seek an in-person evaluation if the symptoms worsen or if the condition fails to improve as anticipated.   BH MD OP Progress Note  06/22/2024 12:52 PM Melinda Shaw  MRN:  [id number]  Chief Complaint:  Chief Complaint  Patient presents with   Follow-up   Anxiety   Depression   Medication Refill   Discussed the use of AI scribe software for clinical note transcription with the patient, who gave verbal consent to proceed.  History of Present Illness Melinda Shaw is a 58 year old female, married, lives in [place], disability, has a history of PTSD, MDD, OCD, COPD, history of CVA, fibromyalgia, history of appendiceal neoplasm, history of prior ileocecectomy, history of hepatitis, chronic pain was evaluated by telemedicine today.  She reports that her mood has been variable, describing herself as a little moody and experiencing increased stress recently. She reports that seasonal changes and not feeling her best physically contribute to these symptoms. She notes that mood symptoms occur intermittently, coming in waves, but she notes they are less severe than in the past. She identifies both anxiety and sadness as  factors in her mood changes. She denies any thoughts of harming herself or others.  Regarding sleep, she states that it is okay. She reports that her appetite fluctuates, but she ensures adequate nutrition.  She confirms that she currently takes sertraline  50 mg daily, topiramate  150 mg daily (3 tablets: 1 in the morning and 2 at bedtime), and Wellbutrin  (bupropion ) 150 mg daily. She also reports having clonazepam  and takes it as needed. She notes that her medications work okay overall.   Visit Diagnosis:    ICD-10-CM   1. PTSD (post-traumatic stress disorder)  F43.10 buPROPion  ER (WELLBUTRIN  SR) 100 MG 12 hr tablet    2. Recurrent major depressive disorder, in partial remission (HCC)  F33.41 buPROPion  ER (WELLBUTRIN  SR) 100 MG 12 hr tablet    3. Obsessive-compulsive disorder with good or fair insight  F42.9       Past Psychiatric History: I have reviewed past psychiatric history from progress note on 12/06/2022.  Past trials of medications like Seroquel-weight gain, multiple others including lithium , Lunesta , Ambien .  Past Medical History:  Past Medical History:  Diagnosis Date   Anemia    H/O   Anxiety    Bronchitis    Chest pain    a. 09/2018 Cath: Nl cors. EF 65%->Med rx (long-acting nitrate added).   Depression    Diastolic dysfunction    a. 10/2018 Echo: EF 55-60%, no rwma, Gr1DD. Mild MR. Nl RV fxn.   DOE (dyspnea on exertion)    Fibromyalgia    GERD (gastroesophageal reflux disease)    Headache    Hyperlipidemia    Hypertension    no  meds   Palpitations    a. 01/2019 [facility]): NSR w/ rare PACs & PVCs; b. 04/2020 [facility]: Predominantly sinus rhythm @ 75 (48-141). Rare PACs/PVCs. No significant arrhythmias or prolonged pauses.  No triggered events.   PONV (postoperative nausea and vomiting)     Past Surgical History:  Procedure Laterality Date   ANTERIOR CRUCIATE LIGAMENT REPAIR     CARDIAC CATHETERIZATION     CHOLECYSTECTOMY     CORONARY ANGIOPLASTY      ESOPHAGOGASTRODUODENOSCOPY (EGD) WITH PROPOFOL  N/A 08/28/2015   Procedure: ESOPHAGOGASTRODUODENOSCOPY (EGD) WITH PROPOFOL ;  Surgeon: Donnice Vaughn Manes, MD;  Location: [facility] ENDOSCOPY;  Service: Endoscopy;  Laterality: N/A;   ESOPHAGOGASTRODUODENOSCOPY (EGD) WITH PROPOFOL  N/A 01/31/2022   Procedure: ESOPHAGOGASTRODUODENOSCOPY (EGD) WITH PROPOFOL ;  Surgeon: Therisa Bi, MD;  Location: [facility] ENDOSCOPY;  Service: Gastroenterology;  Laterality: N/A;   KNEE SURGERY     left knee    LEFT HEART CATH AND CORONARY ANGIOGRAPHY Left 09/22/2018   Procedure: LEFT HEART CATH AND CORONARY ANGIOGRAPHY;  Surgeon: Mady Bruckner, MD;  Location: [facility] INVASIVE CV LAB;  Service: Cardiovascular;  Laterality: Left;   MASS EXCISION Left 01/26/2016   Procedure: EXCISION OF LEFT WRIST VOLAR MASS;  Surgeon: Donnice Robinsons, MD;  Location: [facility];  Service: Orthopedics;  Laterality: Left;   NOSE SURGERY     POLYPECTOMY     TUBAL LIGATION     VIDEO BRONCHOSCOPY N/A 05/29/2015   Procedure: VIDEO BRONCHOSCOPY WITHOUT FLUORO;  Surgeon: Jorie Cha, MD;  Location: [facility] ORS;  Service: Cardiopulmonary;  Laterality: N/A;    Family Psychiatric History: I have reviewed family psychiatric history from progress note on 12/06/2022.  Family History:  Family History  Problem Relation Age of Onset   Dementia Mother    Alcohol abuse Mother    COPD Mother    High Cholesterol Mother    Heart failure Mother    Hypertension Mother    Dementia Father    Alcohol abuse Father    Hypertension Father    AAA (abdominal aortic aneurysm) Sister    COPD Sister    High Cholesterol Sister     Social History: I have reviewed social history from progress note on 12/06/2022. Social History   Socioeconomic History   Marital status: Married    Spouse name: david   Number of children: 3   Years of education: Not on file   Highest education level: 10th grade  Occupational History   Not on file  Tobacco Use   Smoking  status: Never   Smokeless tobacco: Never  Vaping Use   Vaping status: Never Used  Substance and Sexual Activity   Alcohol use: No    Alcohol/week: 0.0 standard drinks of alcohol   Drug use: No   Sexual activity: Yes  Other Topics Concern   Not on file  Social History Narrative   Not on file   Social Drivers of Health   Financial Resource Strain: Low Risk  (12/05/2023)   Overall Financial Resource Strain (CARDIA)    Difficulty of Paying Living Expenses: Not hard at all  Recent Concern: Financial Resource Strain - Medium Risk (10/13/2023)   Received from [facility]   Overall Financial Resource Strain (CARDIA)    Difficulty of Paying Living Expenses: Somewhat hard  Food Insecurity: Food Insecurity Present (12/05/2023)   Hunger Vital Sign    Worried About Running Out of Food in the Last Year: Sometimes true    Ran Out of Food in the  Last Year: Sometimes true  Transportation Needs: Unmet Transportation Needs (12/05/2023)   PRAPARE - Transportation    Lack of Transportation (Medical): Yes    Lack of Transportation (Non-Medical): Yes  Physical Activity: Sufficiently Active (12/05/2023)   Exercise Vital Sign    Days of Exercise per Week: 3 days    Minutes of Exercise per Session: 90 min  Stress: Stress Concern Present (12/05/2023)   [employer] of Occupational Health - Occupational Stress Questionnaire    Feeling of Stress : Rather much  Social Connections: Socially Isolated (12/05/2023)   Social Connection and Isolation Panel    Frequency of Communication with Friends and Family: Never    Frequency of Social Gatherings with Friends and Family: Never    Attends Religious Services: Never    Database administrator or Organizations: No    Attends Banker Meetings: Never    Marital Status: Married    Allergies:  Allergies  Allergen Reactions   Cherry Swelling    Swelling of lips and throat   Other Shortness Of Breath    Stopped breathing,  Swelling of throat, Stopped breathing, Swelling of throat, Stopped breathing, Swelling of throat   Shellfish Allergy Shortness Of Breath   Sulfa Antibiotics Shortness Of Breath    Stopped breathing, Swelling of throat   Bee Venom     Swelling of throat   Imdur  [Isosorbide  Nitrate]     Worsening headaches     Metabolic Disorder Labs: Lab Results  Component Value Date   HGBA1C 5.6 03/08/2024   No results found for: PROLACTIN Lab Results  Component Value Date   CHOL 121 04/18/2023   TRIG 81 04/18/2023   HDL 52 04/18/2023   CHOLHDL 2.3 04/18/2023   VLDL 16 04/18/2023   LDLCALC 53 04/18/2023   Lab Results  Component Value Date   TSH 1.810 01/13/2024   TSH 2.212 04/18/2023    Therapeutic Level Labs: Lab Results  Component Value Date   LITHIUM  0.12 (L) 11/10/2023   LITHIUM  0.58 (L) 10/13/2023   No results found for: VALPROATE No results found for: CBMZ  Current Medications: Current Outpatient Medications  Medication Sig Dispense Refill   buPROPion  ER (WELLBUTRIN  SR) 100 MG 12 hr tablet Take 1 tablet (100 mg total) by mouth 2 (two) times daily. Take 1 tablet daily at 8 AM and 1 tablet daily at 1 PM ( Stop Bupropion  XL 150 MG ) 60 tablet 0   aspirin  EC 81 MG tablet Take 81 mg by mouth daily.     carvedilol  (COREG ) 12.5 MG tablet Take 1 tablet (12.5 mg total) by mouth 2 (two) times daily. 180 tablet 3   clonazePAM  (KLONOPIN ) 0.5 MG tablet TAKE 1 TABLET BY MOUTH 3 TIMES A WEEK ONLY FOR SEVERE ANXIETY ATTACKS 21 tablet 0   cyanocobalamin (VITAMIN B12) 1000 MCG/ML injection INJECT 1 ML INTO THE MUSCLE EVERY THIRTY DAYS     dicyclomine  (BENTYL ) 10 MG capsule Take 1 capsule (10 mg total) by mouth 3 (three) times daily before meals. 90 capsule 5   diltiazem  (CARDIZEM ) 30 MG tablet Take 1 tablet (30 mg total) by mouth daily as needed (palpitations). (Patient not taking: Reported on 05/18/2024) 90 tablet 0   diltiazem  (CARDIZEM ) 30 MG tablet Take 30 mg by mouth as needed. As  needed for (palpations)     docusate sodium  (COLACE) 100 MG capsule Take 1 capsule (100 mg total) by mouth 2 (two) times daily as needed for mild constipation. 180 capsule  3   EPINEPHrine  0.3 mg/0.3 mL IJ SOAJ injection Inject 0.3 mg into the muscle as needed for anaphylaxis. 1 each 1   famotidine  (PEPCID ) 20 MG tablet Take 1 tablet (20 mg total) by mouth at bedtime. 90 tablet 1   Galcanezumab-gnlm 120 MG/ML SOAJ Inject into the skin every 30 (thirty) days.     hydrOXYzine  (VISTARIL ) 25 MG capsule Take 1 capsule (25 mg total) by mouth 3 (three) times daily as needed for anxiety. And sleep 270 capsule 1   lidocaine  (XYLOCAINE ) 5 % ointment Apply 1 Application topically as needed. 35.44 g 0   LINZESS  290 MCG CAPS capsule Take 1 capsule (290 mcg total) by mouth daily. 30 capsule 5   naltrexone  (DEPADE) 50 MG tablet TAKE A HALF TABLET BY MOUTH DAILY 15 tablet 2   nitroGLYCERIN  (NITROSTAT ) 0.4 MG SL tablet PLACE ONE TABLET UNDER THE TONGUE EVERY 5 MINUTES AS NEEDED FOR CHEST PAIN. MAXIMUM OF 3 DOSES. 25 tablet 3   omeprazole  (PRILOSEC) 40 MG capsule Take 1 capsule (40 mg total) by mouth daily. 90 capsule 1   ondansetron  (ZOFRAN  ODT) 4 MG disintegrating tablet Take 1 tablet (4 mg total) by mouth every 8 (eight) hours as needed for nausea or vomiting. 12 tablet 0   ondansetron  (ZOFRAN ) 4 MG tablet Take 1 tablet by mouth every 8 (eight) hours as needed.     potassium chloride  SA (KLOR-CON  M20) 20 MEQ tablet Take 1 tablet (20 mEq total) by mouth 2 (two) times daily. 180 tablet 3   rosuvastatin  (CRESTOR ) 10 MG tablet TAKE 1 TABLET BY MOUTH AT BEDTIME 90 tablet 3   sertraline  (ZOLOFT ) 50 MG tablet Take 50 mg by mouth daily.     tiZANidine  (ZANAFLEX ) 4 MG tablet Take 4 mg by mouth once.     topiramate  (TOPAMAX ) 50 MG tablet Take 3 tablets (150 mg total) by mouth as directed. Take 1 tablet daily at 11 AM and 2 tablets daily at bedtime 90 tablet 2   No current facility-administered medications for this visit.      Musculoskeletal: Strength & Muscle Tone: UTA Gait & Station: Seated Patient leans: N/A  Psychiatric Specialty Exam: Review of Systems  Psychiatric/Behavioral:  The patient is nervous/anxious.        Low mood on and off    Last menstrual period 09/24/2016.There is no height or weight on file to calculate BMI.  General Appearance: Casual  Eye Contact:  Fair  Speech:  Clear and Coherent  Volume:  Normal  Mood:  Anxious, low mood on and off  Affect:  Appropriate  Thought Process:  Goal Directed and Descriptions of Associations: Intact  Orientation:  Full (Time, Place, and Person)  Thought Content: Logical   Suicidal Thoughts:  No  Homicidal Thoughts:  No  Memory:  Immediate;   Fair Recent;   Fair Remote;   Fair  Judgement:  Fair  Insight:  Fair  Psychomotor Activity:  Normal  Concentration:  Concentration: Fair and Attention Span: Fair  Recall:  [employer] of Knowledge: Fair  Language: Fair  Akathisia:  No  Handed:  Right  AIMS (if indicated): not done  Assets:  Communication Skills Desire for Improvement Housing Social Support Transportation  ADL's:  Intact  Cognition: WNL  Sleep:  Fair   Screenings: GAD-7    Garment/textile technologist Visit from 03/08/2024 in [facility] Office Visit from 02/03/2024 in [facility] Office Visit from 12/17/2023 in [facility] Counselor from 05/07/2023 in [facility] Video Visit from 02/18/2023 in [facility]  Total GAD-7 Score 4 12 13 9 9    PHQ2-9    Flowsheet Row Office Visit from 03/08/2024 in [facility] Office Visit from 02/03/2024 in [facility] Office Visit from 12/17/2023 in [facility] Video Visit from 10/24/2023 in [facility] Clinical Support from 10/21/2023 in [facility]  PHQ-2 Total Score 0 4 1 1 1   PHQ-9 Total Score 3 14 10 11 3    Flowsheet Row Video Visit from 06/21/2024 in [facility] Video Visit from 03/18/2024 in [facility] Office Visit from 02/03/2024 in [facility]  C-SSRS RISK CATEGORY No Risk No Risk No Risk     Assessment and Plan: ARIENNE GARTIN is a 58 year old female, has a history of MDD, PTSD, OCD, grief, was evaluated by telemedicine today.  Discussed assessment and plan as noted below.  MDD in partial remission Depression symptoms are better managed although she continues to have episodic worsening of sadness.  She is interested in dosage increase of Wellbutrin . Change Wellbutrin  to SR 100 mg twice daily ( Dose increase) Continue Sertraline  50 mg daily Continue Topiramate  150 mg daily in divided dosage  Obsessive-compulsive disorder-stable OCD symptoms are well controlled with sertraline . Continue Sertraline  50 mg daily Continue Hydroxyzine  25 mg 3 times a day as needed Continue Clonazepam  0.5 mg 3 times a day as needed. Reviewed [place] PMP AWARxE  PTSD-stable Currently denies any significant irritability or trauma related symptoms, reports sleep is overall improved. Continue Clonazepam  0.5 mg as needed Continue Sertraline  50 mg daily  Follow-up Follow-up in clinic in 1 month or sooner if needed.    Consent: Patient/Guardian gives verbal consent for treatment and assignment of benefits for services provided during this visit. Patient/Guardian expressed understanding and agreed to proceed.   This note was generated in part or whole with voice recognition software. Voice recognition is usually quite accurate but there are transcription errors that can and very often do occur. I apologize for any typographical  errors that were not detected and corrected.    Williams Dietrick, MD 06/22/2024, 12:53 PM

## 2024-06-22 DIAGNOSIS — F3341 Major depressive disorder, recurrent, in partial remission: Secondary | ICD-10-CM | POA: Insufficient documentation

## 2024-07-09 ENCOUNTER — Encounter: Payer: Self-pay | Admitting: Family Medicine

## 2024-07-09 ENCOUNTER — Ambulatory Visit (INDEPENDENT_AMBULATORY_CARE_PROVIDER_SITE_OTHER): Admitting: Family Medicine

## 2024-07-09 VITALS — BP 109/72 | HR 65 | Ht 60.0 in | Wt 157.9 lb

## 2024-07-09 DIAGNOSIS — F331 Major depressive disorder, recurrent, moderate: Secondary | ICD-10-CM

## 2024-07-09 DIAGNOSIS — Z23 Encounter for immunization: Secondary | ICD-10-CM

## 2024-07-09 DIAGNOSIS — E66811 Obesity, class 1: Secondary | ICD-10-CM | POA: Diagnosis not present

## 2024-07-09 DIAGNOSIS — S6991XA Unspecified injury of right wrist, hand and finger(s), initial encounter: Secondary | ICD-10-CM | POA: Diagnosis not present

## 2024-07-09 DIAGNOSIS — Z6831 Body mass index (BMI) 31.0-31.9, adult: Secondary | ICD-10-CM

## 2024-07-09 DIAGNOSIS — R600 Localized edema: Secondary | ICD-10-CM | POA: Diagnosis not present

## 2024-07-09 MED ORDER — NALTREXONE HCL 50 MG PO TABS
50.0000 mg | ORAL_TABLET | Freq: Every day | ORAL | 3 refills | Status: AC
Start: 2024-07-09 — End: ?

## 2024-07-09 MED ORDER — BUPROPION HCL ER (XL) 150 MG PO TB24: 150.0000 mg | ORAL_TABLET | Freq: Every day | ORAL | 1 refills | Status: DC

## 2024-07-09 NOTE — Progress Notes (Signed)
 Established Patient Office Visit  Introduced to nurse practitioner role and practice setting.  All questions answered.  Discussed provider/patient relationship and expectations.  Subjective   Patient ID: Melinda Shaw, female    DOB: [birth year]  Age: 58 y.o. MRN: [id number]  Chief Complaint  Patient presents with   Medical Management of Chronic Issues    Patient last seen 03/08/24. She was initiated on Contrave (Wellbutrin  and naltrexone ) for weight management   Weight Check   Immunizations    Influenza - ok to administer Zoster Vaccine - ok to administer   Discussed the use of AI scribe software for clinical note transcription with the patient, who gave verbal consent to proceed.  History of Present Illness Melinda Shaw is a 58 year old female who presents for medication management and evaluation of a finger injury.  She is currently taking Wellbutrin  and naltrexone , which were started in April for weight loss. She takes half a tablet of naltrexone , 25mg , and a full tablet of Wellbutrin , 100mg . Her psychiatric doctor suggested increasing the Wellbutrin  dosage to 200mg  due to increased sadness, but she has not changed the dosage. She attributes her sadness to the recent loss of her dog, which has been emotionally challenging for her.  She has experienced a weight loss of five pounds since starting the medication regimen. However, she feels that her weight loss has stalled and is considering adjustments to her medication. She is also on Topamax  for migraines, which she notes is also used for weight management.  She describes a recent injury to her R pinky finger, which occurred three to four weeks ago. She smashed her finger between a lawnmower and a picnic table, resulting in immediate loss of the nail and significant bleeding. She has ongoing pain and swelling. She has been managing the injury by clipping the damaged nail and allowing it to air out.  She was recently  switched from diltiazem  to Coreg  (carvedilol ) by her cardiologist due to concerns about leg swelling potentially caused by the diltiazem . She reports some improvement in swelling but notes it is still present.      07/09/2024   10:38 AM 03/08/2024    3:43 PM 02/03/2024    2:12 PM  Depression screen PHQ 2/9  Decreased Interest 0 0   Down, Depressed, Hopeless 0 0   PHQ - 2 Score 0 0   Altered sleeping 2 0   Tired, decreased energy 2 1   Change in appetite 1 1   Feeling bad or failure about yourself  0 0   Trouble concentrating 1 1   Moving slowly or fidgety/restless 3 0   Suicidal thoughts 0 0   PHQ-9 Score 9 3   Difficult doing work/chores Not difficult at all Not difficult at all      Information is confidential and restricted. Go to Review Flowsheets to unlock data.       07/09/2024   10:38 AM 03/08/2024    3:43 PM 02/03/2024    2:13 PM 12/17/2023    1:57 PM  GAD 7 : Generalized Anxiety Score  Nervous, Anxious, on Edge 0 0    Control/stop worrying 0 0    Worry too much - different things 1 0    Trouble relaxing 3 1    Restless 3 3    Easily annoyed or irritable 3 0    Afraid - awful might happen 0 0    Total GAD 7 Score 10 4  Anxiety Difficulty Not difficult at all Not difficult at all       Information is confidential and restricted. Go to Review Flowsheets to unlock data.     ROS  Negative unless indicated in HPI   Objective:     BP 109/72 (BP Location: Right Arm, Patient Position: Sitting, Cuff Size: Normal)   Pulse 65   Ht 5' (1.524 m)   Wt 157 lb 14.4 oz (71.6 kg)   LMP 09/24/2016 (Approximate)   SpO2 100%   BMI 30.84 kg/m    Physical Exam Constitutional:      General: She is not in acute distress.    Appearance: Normal appearance. She is obese. She is not ill-appearing, toxic-appearing or diaphoretic.  HENT:     Head: Normocephalic.     Nose: Nose normal.     Mouth/Throat:     Mouth: Mucous membranes are moist.     Pharynx: Oropharynx is clear.   Eyes:     Extraocular Movements: Extraocular movements intact.     Pupils: Pupils are equal, round, and reactive to light.  Cardiovascular:     Rate and Rhythm: Normal rate and regular rhythm.     Pulses: Normal pulses.     Heart sounds: Normal heart sounds. No murmur heard.    No friction rub. No gallop.  Pulmonary:     Effort: No respiratory distress.     Breath sounds: No stridor. No wheezing, rhonchi or rales.  Chest:     Chest wall: No tenderness.  Musculoskeletal:        General: Swelling present.     Right hand: Deformity present. No lacerations, tenderness or bony tenderness. Normal range of motion. Normal strength. Normal sensation. Normal capillary refill. Normal pulse.     Left hand: Normal.     Right lower leg: No edema.     Left lower leg: No edema.     Comments: Non pitting bilateral chronic swelling BLE - Baseline. Recent injury to R fifth finger, lost of nail. Mild erythema, nail is starting to grow back.   Skin:    General: Skin is warm and dry.     Capillary Refill: Capillary refill takes less than 2 seconds.  Neurological:     General: No focal deficit present.     Mental Status: She is alert and oriented to person, place, and time. Mental status is at baseline.     Cranial Nerves: No cranial nerve deficit.     Motor: No weakness.     Gait: Gait normal.  Psychiatric:        Mood and Affect: Mood normal.        Behavior: Behavior normal.        Thought Content: Thought content normal.        Judgment: Judgment normal.      No results found for any visits on 07/09/24.    The ASCVD Risk score (Arnett DK, et al., 2019) failed to calculate for the following reasons:   Risk score cannot be calculated because patient has a medical history suggesting prior/existing ASCVD    Assessment & Plan:  Immunization due -     Flu vaccine trivalent PF, 6mos and older(Flulaval,Afluria,Fluarix,Fluzone) -     Varicella-zoster vaccine IM  Class 1 obesity without  serious comorbidity with body mass index (BMI) of 31.0 to 31.9 in adult, unspecified obesity type -     buPROPion  HCl ER (XL); Take 1 tablet (150 mg total) by mouth daily.  Dispense: 90 tablet; Refill: 1 -     Naltrexone  HCl; Take 1 tablet (50 mg total) by mouth daily.  Dispense: 30 tablet; Refill: 3     Assessment and Plan Assessment & Plan Obesity, class 1; BMI = 30.84 Obesity, class 1 with recent weight loss of 5 pounds. Current treatment includes Wellbutrin  and naltrexone . She reports feeling stalled in weight loss progress. Discussed increasing naltrexone  to 50 mg and Wellbutrin  to 150 mg to potentially enhance weight loss. Alternative weight management options may be considered if current regimen is ineffective. - Increase naltrexone  to 50 mg daily. - Increase Wellbutrin  to 150 mg daily. - Schedule weight check in 3 months. - Ultimately weight loss needs lifestyle changes as well - increasing weekly exercise and well balanced diet.   Depression - mgmt by Psych Depression with recent increase in sadness attributed to the loss of a pet. Current treatment includes Wellbutrin . Psychiatrist recommended increasing Wellbutrin  dose due to increased sadness, but she has not changed the dose. Discussed increasing Wellbutrin  to 150 mg to address mood concerns. - Continue mgmt by Psych - PRN hydrozyine and klonopin  for anxiety - Daily Zoloft  50mg  - Daily topamax   50mg  - Increase Wellbutrin  to 150 mg daily for weight loss benefits - pt was hesitate to increase to 100mg  BID. - Monitor mood and reassess in 3 months.  Edema of lower extremities Edema of lower extremities possibly related to previous use of diltiazem . She switched to Coreg . Vein specialist did not recommend further intervention. She reports intermittent use of compression socks. Discussed the potential benefit of wearing compression socks more consistently in cooler weather and elevating legs when possible. - has stopped CCB and  started coreg . - Encourage use of compression socks, especially in cooler weather. - Elevate legs when possible. - Exercise/ walking daily recommend  Traumatic injury to finger with nail loss Traumatic injury to finger with nail loss occurred 3-4 weeks ago. Nail was lost immediately after injury. Finger remains sore and slightly swollen, but no signs of infection. Healing is ongoing. Discussed natural healing process and expected time for nail regrowth. - Continue taking Tylenol  as needed for pain. - Apply ice to the finger as needed. - Allow nail to heal naturally over time.  General Health Maintenance She is due for vaccinations and is agreeable to receiving them today. - Administer Shingles and Flu   Return in about 3 months (around 10/09/2024) for Weight and Labs.   Introduced to Publishing rights manager role and practice setting.  All questions answered.  Discussed provider/patient relationship and expectations.   Melinda DELENA Boom, FNP

## 2024-07-11 DIAGNOSIS — Z6831 Body mass index (BMI) 31.0-31.9, adult: Secondary | ICD-10-CM | POA: Insufficient documentation

## 2024-07-13 ENCOUNTER — Encounter: Payer: Self-pay | Admitting: Family Medicine

## 2024-07-21 ENCOUNTER — Ambulatory Visit: Attending: Internal Medicine | Admitting: Internal Medicine

## 2024-07-21 ENCOUNTER — Encounter: Payer: Self-pay | Admitting: Internal Medicine

## 2024-07-21 VITALS — BP 108/74 | HR 55 | Ht 60.0 in | Wt 156.1 lb

## 2024-07-21 DIAGNOSIS — I10 Essential (primary) hypertension: Secondary | ICD-10-CM | POA: Diagnosis not present

## 2024-07-21 DIAGNOSIS — R002 Palpitations: Secondary | ICD-10-CM | POA: Diagnosis not present

## 2024-07-21 DIAGNOSIS — R600 Localized edema: Secondary | ICD-10-CM | POA: Diagnosis not present

## 2024-07-21 MED ORDER — NITROGLYCERIN 0.4 MG SL SUBL: SUBLINGUAL_TABLET | SUBLINGUAL | 0 refills | Status: AC

## 2024-07-21 MED ORDER — CARVEDILOL 12.5 MG PO TABS: 12.5000 mg | ORAL_TABLET | Freq: Two times a day (BID) | ORAL | 3 refills | Status: AC

## 2024-07-21 MED ORDER — ROSUVASTATIN CALCIUM 10 MG PO TABS: 10.0000 mg | ORAL_TABLET | Freq: Every day | ORAL | 3 refills | Status: AC

## 2024-07-21 MED ORDER — POTASSIUM CHLORIDE CRYS ER 20 MEQ PO TBCR: 20.0000 meq | EXTENDED_RELEASE_TABLET | Freq: Two times a day (BID) | ORAL | 3 refills | Status: AC

## 2024-07-21 NOTE — Patient Instructions (Signed)
 Medication Instructions:  No changes *If you need a refill on your cardiac medications before your next appointment, please call your pharmacy*  Lab Work: None ordereed If you have labs (blood work) drawn today and your tests are completely normal, you will receive your results only by: MyChart Message (if you have MyChart) OR A paper copy in the mail If you have any lab test that is abnormal or we need to change your treatment, we will call you to review the results.  Testing/Procedures: None ordered  Follow-Up: At [facility], you and your health needs are our priority.  As part of our continuing mission to provide you with exceptional heart care, our providers are all part of one team.  This team includes your primary Cardiologist (physician) and Advanced Practice Providers or APPs (Physician Assistants and Nurse Practitioners) who all work together to provide you with the care you need, when you need it.  Your next appointment:   6 month(s)  Provider:   You may see Lonni Hanson, MD or one of the following Advanced Practice Providers on your designated Care Team:   Lonni Meager, NP Lesley Maffucci, PA-C Bernardino Bring, PA-C Cadence [place], PA-C Tylene Lunch, NP Barnie Hila, NP    We recommend signing up for the patient portal called MyChart.  Sign up information is provided on this After Visit Summary.  MyChart is used to connect with patients for Virtual Visits (Telemedicine).  Patients are able to view lab/test results, encounter notes, upcoming appointments, etc.  Non-urgent messages can be sent to your provider as well.   To learn more about what you can do with MyChart, go to [email or web address].

## 2024-07-21 NOTE — Progress Notes (Signed)
 Cardiology Office Note:  .   Date:  07/21/2024  ID:  Melinda Shaw, DOB 12-Apr-1966, MRN [id number] PCP: Donzella Lauraine SAILOR, DO  [facility] Providers Cardiologist:  Lonni Hanson, MD     History of Present Illness: .   Melinda Shaw is a 58 y.o. female with history of chest pain with normal coronary arteries by coronary CTA, dyspnea on exertion, palpitations, hypertension, hyperlipidemia, fibromyalgia, PTSD, bronchitis, chronic migraine, and GERD, who presents for follow-up of palpitations and dizziness as well as hypertension.  She was last seen in the office in May by Bernardino Bring, PA, at which time she had several concerns surrounding intermittent dizziness, palpitations, weight gain, lower extremity edema, and elevated blood pressures.  She was switched from diltiazem  to carvedilol  to see if that would help with some of her leg edema.  Repeat echocardiogram and event monitor were also ordered and showed no significant arrhythmias and structurally normal heart other than mild-moderate mitral valve regurgitation.  Today, Melinda Shaw reports that she continues to have sporadic flutters in her chest, though overall they seem to have gotten a little better since transitioning from diltiazem  to carvedilol .  She had 1 more pronounced episode yesterday that felt like butterflies in her chest that lasted a couple of minutes.  She notes that she had an allergic reaction to her shingles and flu vaccine last week; query if that could have contributed to some of her palpitations.  She also notes an episode of lightheadedness about 3 weeks ago that may have coincided with transient palpitations.  She notes that her leg swelling has improved since switching from diltiazem  to carvedilol .  Chronic exertional dyspnea is unchanged from baseline; no chest pain reported.  ROS: See HPI  Studies Reviewed: SABRA   EKG Interpretation Date/Time:  Wednesday July 21 2024 10:13:14 EDT Ventricular Rate:  55 PR  Interval:  164 QRS Duration:  78 QT Interval:  400 QTC Calculation: 382 R Axis:   69  Text Interpretation: Sinus bradycardia Normal ECG When compared with ECG of 25-Nov-2023 14:50, No significant change was found Confirmed by Malikah Principato [phone]) on 07/21/2024 12:58:26 PM    TTE (04/02/2024): Normal LV size and wall thickness.  LVEF 55-60% with normal wall motion and grade 1 diastolic dysfunction.  Normal RV size and function.  Normal PA pressure.  Normal biatrial size.  No pericardial effusion.  Mildly calcified mitral valve with mild-moderate regurgitation.  Mild tricuspid regurgitation.  Normal aortic valve.  Normal CVP.  Risk Assessment/Calculations:             Physical Exam:   VS:  BP 108/74 (BP Location: Left Arm, Patient Position: Sitting, Cuff Size: Large)   Pulse (!) 55   Ht 5' (1.524 m)   Wt 156 lb 2 oz (70.8 kg)   LMP 09/24/2016 (Approximate)   BMI 30.49 kg/m    Wt Readings from Last 3 Encounters:  07/21/24 156 lb 2 oz (70.8 kg)  07/09/24 157 lb 14.4 oz (71.6 kg)  05/18/24 157 lb 9.6 oz (71.5 kg)    General:  NAD. Neck: No JVD or HJR. Lungs: Clear to auscultation bilaterally without wheezes or crackles. Heart: Regular rate and rhythm without murmurs, rubs, or gallops. Abdomen: Soft, nontender, nondistended. Extremities: No lower extremity edema.  ASSESSMENT AND PLAN: .    Palpitations: Symptoms have been longstanding and actually are somewhat better since switching from diltiazem  to carvedilol .  Event monitor last year was reassuring with no significant arrhythmias; most patient  triggered events at that time corresponded to sinus rhythm suggesting a palpitations may at least in part be noncardiac in origin.  We will defer medication changes and additional testing at this time, though if frequency/duration of palpitations were to increase, repeat ambulatory cardiac monitoring would need to be considered.  Hypertension: Blood pressure very well-controlled today.   Continue current medical.  Lower extremity edema: Overall, this has improved some with discontinuation of diltiazem .  No further intervention at this time.    Dispo: Return to clinic in 6 months.  Signed, Lonni Hanson, MD

## 2024-07-23 ENCOUNTER — Telehealth: Admitting: Psychiatry

## 2024-07-23 ENCOUNTER — Encounter: Payer: Self-pay | Admitting: Psychiatry

## 2024-07-23 DIAGNOSIS — F3342 Major depressive disorder, recurrent, in full remission: Secondary | ICD-10-CM

## 2024-07-23 DIAGNOSIS — F4310 Post-traumatic stress disorder, unspecified: Secondary | ICD-10-CM

## 2024-07-23 DIAGNOSIS — R41840 Attention and concentration deficit: Secondary | ICD-10-CM | POA: Diagnosis not present

## 2024-07-23 DIAGNOSIS — F429 Obsessive-compulsive disorder, unspecified: Secondary | ICD-10-CM

## 2024-07-23 MED ORDER — BUPROPION HCL ER (SR) 100 MG PO TB12: 100.0000 mg | ORAL_TABLET | Freq: Two times a day (BID) | ORAL | 0 refills | Status: DC

## 2024-07-23 NOTE — Progress Notes (Signed)
 Virtual Visit via Video Note  I connected with Melinda Shaw on 07/23/24 at 11:40 AM EDT by a video enabled telemedicine application and verified that I am speaking with the correct person using two identifiers.  Location Provider Location : [facility] Patient Location : Home  Participants: Patient , Provider   I discussed the limitations of evaluation and management by telemedicine and the availability of in person appointments. The patient expressed understanding and agreed to proceed.   I discussed the assessment and treatment plan with the patient. The patient was provided an opportunity to ask questions and all were answered. The patient agreed with the plan and demonstrated an understanding of the instructions.   The patient was advised to call back or seek an in-person evaluation if the symptoms worsen or if the condition fails to improve as anticipated.   BH MD OP Progress Note  07/23/2024 2:20 PM Melinda Shaw  MRN:  [id number]  Chief Complaint:  Chief Complaint  Patient presents with   Follow-up   Medication Refill   Anxiety   Depression   Attention and concentration deficit    Discussed the use of AI scribe software for clinical note transcription with the patient, who gave verbal consent to proceed.  History of Present Illness Melinda Shaw is a 58 year old female, married, lives in [place], on disability, has a history of PTSD, MDD, OCD, COPD, history of CVA, fibromyalgia, history of appendiceal neoplasm, history of prior ileocecectomy, history of hepatitis, chronic pain was evaluated by telemedicine today.  She reports improvement in mood since her last visit, following an increase in her bupropion  dosage to 100 mg twice daily. She continues to take sertraline  and Topamax  as previously prescribed. She confirms that she currently takes bupropion  SR 100 mg twice daily, in the morning and around 1 PM, and has not picked up a different prescription from the  pharmacy which was sent over by primary care provider. She has not had any changes to her medication regimen from other providers.  She characterizes her experience by a persistent lifelong pattern of needing to stay busy and constantly moving, with an inability to be still and a continuously active mind except during sleep. She reports that improved sleep has positively impacted her mood and ability to focus during the day. Previously, she experienced significant difficulty with sleep and focus, but now describes better sleep as a 'Secretary/administrator.'  She continues to experience restlessness, distractibility, and a compulsion to keep moving, which she states have been present since childhood. She reports difficulty sitting still or focusing in school and notes that others often perceived her as a troublemaker. She has not previously undergone testing for ADHD. She is not currently engaged in therapy.  She denies any suicidality, homicidality or perceptual disturbances.   Visit Diagnosis:    ICD-10-CM   1. PTSD (post-traumatic stress disorder)  F43.10 buPROPion  ER (WELLBUTRIN  SR) 100 MG 12 hr tablet    2. Recurrent major depressive disorder, in full remission (HCC)  F33.42 buPROPion  ER (WELLBUTRIN  SR) 100 MG 12 hr tablet    3. Obsessive-compulsive disorder with good or fair insight  F42.9     4. Attention and concentration deficit  R41.840 Ambulatory referral to Neuropsychology      Past Psychiatric History: I have reviewed past psychiatric history from progress note on 12/06/2022.  Past trials of medications like Seroquel-weight gain, multiple others including lithium , Lunesta , Ambien .  Past Medical History:  Past Medical History:  Diagnosis Date  Allergy 2007   Anemia    H/O   Anxiety    Arthritis    Back / shoulder   Bronchitis    Cancer (HCC) 2020   Ovary & ReSecetion apendiseal cancer   Chest pain    a. 09/2018 Cath: Nl cors. EF 65%->Med rx (long-acting nitrate added).   COPD  (chronic obstructive pulmonary disease) (HCC) Many years  I've required puffers   Depression    Diastolic dysfunction    a. 10/2018 Echo: EF 55-60%, no rwma, Gr1DD. Mild MR. Nl RV fxn.   DOE (dyspnea on exertion)    Fibromyalgia    GERD (gastroesophageal reflux disease)    Headache    Hyperlipidemia    Hypertension    no meds   Palpitations    a. 01/2019 [facility]): NSR w/ rare PACs & PVCs; b. 04/2020 [facility]: Predominantly sinus rhythm @ 75 (48-141). Rare PACs/PVCs. No significant arrhythmias or prolonged pauses.  No triggered events.   PONV (postoperative nausea and vomiting)    Seizures (HCC)    Stroke (HCC) 2018   Confusion/ first mumbling felloll the porch didnt know  it was a stroke    Past Surgical History:  Procedure Laterality Date   ANTERIOR CRUCIATE LIGAMENT REPAIR     APPENDECTOMY     CARDIAC CATHETERIZATION     CESAREAN SECTION  1989   CHOLECYSTECTOMY     COLON SURGERY     Appendiceal resection surgery 2020   CORONARY ANGIOPLASTY     ESOPHAGOGASTRODUODENOSCOPY (EGD) WITH PROPOFOL  N/A 08/28/2015   Procedure: ESOPHAGOGASTRODUODENOSCOPY (EGD) WITH PROPOFOL ;  Surgeon: Donnice Vaughn Manes, MD;  Location: [facility] ENDOSCOPY;  Service: Endoscopy;  Laterality: N/A;   ESOPHAGOGASTRODUODENOSCOPY (EGD) WITH PROPOFOL  N/A 01/31/2022   Procedure: ESOPHAGOGASTRODUODENOSCOPY (EGD) WITH PROPOFOL ;  Surgeon: Therisa Bi, MD;  Location: [facility] ENDOSCOPY;  Service: Gastroenterology;  Laterality: N/A;   JOINT REPLACEMENT     Knee /Acl   KNEE SURGERY     left knee    LEFT HEART CATH AND CORONARY ANGIOGRAPHY Left 09/22/2018   Procedure: LEFT HEART CATH AND CORONARY ANGIOGRAPHY;  Surgeon: Mady Bruckner, MD;  Location: [facility] INVASIVE CV LAB;  Service: Cardiovascular;  Laterality: Left;   MASS EXCISION Left 01/26/2016   Procedure: EXCISION OF LEFT WRIST VOLAR MASS;  Surgeon: Donnice Robinsons, MD;  Location: [facility];  Service: Orthopedics;  Laterality: Left;   NOSE SURGERY      POLYPECTOMY     TUBAL LIGATION     VIDEO BRONCHOSCOPY N/A 05/29/2015   Procedure: VIDEO BRONCHOSCOPY WITHOUT FLUORO;  Surgeon: Jorie Cha, MD;  Location: [facility] ORS;  Service: Cardiopulmonary;  Laterality: N/A;    Family Psychiatric History: I have reviewed family psychiatric history from progress note on 12/06/2022.  Family History:  Family History  Problem Relation Age of Onset   Dementia Mother    Alcohol abuse Mother    COPD Mother    High Cholesterol Mother    Heart failure Mother    Hypertension Mother    Depression Mother    Heart disease Mother    Dementia Father    Alcohol abuse Father    Hypertension Father    AAA (abdominal aortic aneurysm) Sister    COPD Sister    High Cholesterol Sister     Social History: I have reviewed social history from progress note on 12/06/2022. Social History   Socioeconomic History   Marital status: Married    Spouse name: david   Number of children: 3  Years of education: Not on file   Highest education level: 10th grade  Occupational History   Not on file  Tobacco Use   Smoking status: Never   Smokeless tobacco: Never  Vaping Use   Vaping status: Never Used  Substance and Sexual Activity   Alcohol use: No    Alcohol/week: 0.0 standard drinks of alcohol   Drug use: No   Sexual activity: Yes  Other Topics Concern   Not on file  Social History Narrative   Not on file   Social Drivers of Health   Financial Resource Strain: High Risk (07/06/2024)   Overall Financial Resource Strain (CARDIA)    Difficulty of Paying Living Expenses: Very hard  Food Insecurity: Food Insecurity Present (07/06/2024)   Hunger Vital Sign    Worried About Running Out of Food in the Last Year: Often true    Ran Out of Food in the Last Year: Often true  Transportation Needs: No Transportation Needs (07/06/2024)   PRAPARE - Administrator, Civil Service (Medical): No    Lack of Transportation (Non-Medical): No  Physical Activity:  Sufficiently Active (07/06/2024)   Exercise Vital Sign    Days of Exercise per Week: 7 days    Minutes of Exercise per Session: 150+ min  Stress: Stress Concern Present (07/06/2024)   Melinda Shaw    Feeling of Stress: To some extent  Social Connections: Moderately Isolated (07/06/2024)   Social Connection and Isolation Panel    Frequency of Communication with Friends and Family: More than three times a week    Frequency of Social Gatherings with Friends and Family: More than three times a week    Attends Religious Services: Patient declined    Database administrator or Organizations: No    Attends Engineer, structural: Not on file    Marital Status: Married    Allergies:  Allergies  Allergen Reactions   Cherry Swelling    Swelling of lips and throat   Other Shortness Of Breath    Stopped breathing, Swelling of throat, Stopped breathing, Swelling of throat, Stopped breathing, Swelling of throat   Shellfish Allergy Shortness Of Breath   Sulfa Antibiotics Shortness Of Breath    Stopped breathing, Swelling of throat   Bee Venom     Swelling of throat   Imdur  [Isosorbide  Nitrate]     Worsening headaches     Metabolic Disorder Labs: Lab Results  Component Value Date   HGBA1C 5.6 03/08/2024   No results found for: PROLACTIN Lab Results  Component Value Date   CHOL 121 04/18/2023   TRIG 81 04/18/2023   HDL 52 04/18/2023   CHOLHDL 2.3 04/18/2023   VLDL 16 04/18/2023   LDLCALC 53 04/18/2023   Lab Results  Component Value Date   TSH 1.810 01/13/2024   TSH 2.212 04/18/2023    Therapeutic Level Labs: Lab Results  Component Value Date   LITHIUM  0.12 (L) 11/10/2023   LITHIUM  0.58 (L) 10/13/2023   No results found for: VALPROATE No results found for: CBMZ  Current Medications: Current Outpatient Medications  Medication Sig Dispense Refill   buPROPion  ER (WELLBUTRIN  SR) 100 MG 12 hr tablet  Take 1 tablet (100 mg total) by mouth 2 (two) times daily. Take daily at 7 AM and at 1 PM ( Stop Wellbutrin  XL 150 mg ) 180 tablet 0   aspirin  EC 81 MG tablet Take 81 mg by mouth daily.  carvedilol  (COREG ) 12.5 MG tablet Take 1 tablet (12.5 mg total) by mouth 2 (two) times daily. 180 tablet 3   clonazePAM  (KLONOPIN ) 0.5 MG tablet TAKE 1 TABLET BY MOUTH 3 TIMES A WEEK ONLY FOR SEVERE ANXIETY ATTACKS 21 tablet 0   cyanocobalamin (VITAMIN B12) 1000 MCG/ML injection INJECT 1 ML INTO THE MUSCLE EVERY THIRTY DAYS     dicyclomine  (BENTYL ) 10 MG capsule Take 1 capsule (10 mg total) by mouth 3 (three) times daily before meals. 90 capsule 5   docusate sodium  (COLACE) 100 MG capsule Take 1 capsule (100 mg total) by mouth 2 (two) times daily as needed for mild constipation. 180 capsule 3   EPINEPHrine  0.3 mg/0.3 mL IJ SOAJ injection Inject 0.3 mg into the muscle as needed for anaphylaxis. 1 each 1   famotidine  (PEPCID ) 20 MG tablet Take 1 tablet (20 mg total) by mouth at bedtime. 90 tablet 1   Galcanezumab-gnlm 120 MG/ML SOAJ Inject into the skin every 30 (thirty) days.     hydrOXYzine  (VISTARIL ) 25 MG capsule Take 1 capsule (25 mg total) by mouth 3 (three) times daily as needed for anxiety. And sleep 270 capsule 1   LINZESS  290 MCG CAPS capsule Take 1 capsule (290 mcg total) by mouth daily. 30 capsule 5   naltrexone  (DEPADE) 50 MG tablet Take 1 tablet (50 mg total) by mouth daily. 30 tablet 3   nitroGLYCERIN  (NITROSTAT ) 0.4 MG SL tablet PLACE ONE TABLET UNDER THE TONGUE EVERY 5 MINUTES AS NEEDED FOR CHEST PAIN. MAXIMUM OF 3 DOSES. 25 tablet 0   omeprazole  (PRILOSEC) 40 MG capsule Take 1 capsule (40 mg total) by mouth daily. 90 capsule 1   ondansetron  (ZOFRAN  ODT) 4 MG disintegrating tablet Take 1 tablet (4 mg total) by mouth every 8 (eight) hours as needed for nausea or vomiting. 12 tablet 0   ondansetron  (ZOFRAN ) 4 MG tablet Take 1 tablet by mouth every 8 (eight) hours as needed. (Patient not taking:  Reported on 07/21/2024)     potassium chloride  SA (KLOR-CON  M20) 20 MEQ tablet Take 1 tablet (20 mEq total) by mouth 2 (two) times daily. 180 tablet 3   rosuvastatin  (CRESTOR ) 10 MG tablet Take 1 tablet (10 mg total) by mouth at bedtime. 90 tablet 3   sertraline  (ZOLOFT ) 50 MG tablet Take 50 mg by mouth daily.     tiZANidine  (ZANAFLEX ) 4 MG tablet Take 4 mg by mouth once.     topiramate  (TOPAMAX ) 50 MG tablet Take 3 tablets (150 mg total) by mouth as directed. Take 1 tablet daily at 11 AM and 2 tablets daily at bedtime 90 tablet 2   No current facility-administered medications for this visit.     Musculoskeletal: Strength & Muscle Tone: UTA Gait & Station: Seated Patient leans: NA  Psychiatric Specialty Exam: Review of Systems  Psychiatric/Behavioral:  The patient is nervous/anxious.     Last menstrual period 09/24/2016.There is no height or weight on file to calculate BMI.  General Appearance: Fairly Groomed  Eye Contact:  Fair  Speech:  Normal Rate  Volume:  Normal  Mood:  Anxious improving  Affect:  Appropriate  Thought Process:  Goal Directed and Descriptions of Associations: Intact  Orientation:  Full (Time, Place, and Person)  Thought Content: Logical   Suicidal Thoughts:  No  Homicidal Thoughts:  No  Memory:  Immediate;   Fair Recent;   Fair Remote;   Fair  Judgement:  Fair  Insight:  Fair  Psychomotor Activity:  Normal  Concentration:  Concentration: Fair and Attention Span: Fair  Recall:  [employer] of Knowledge: Fair  Language: Fair  Akathisia:  No  Handed:  Right  AIMS (if indicated): not done  Assets:  Communication Skills Desire for Improvement Housing Social Support  ADL's:  Intact  Cognition: WNL  Sleep:  Fair   Screenings: GAD-7    Garment/textile technologist Visit from 07/09/2024 in [facility] Office Visit from 03/08/2024 in [facility] Office Visit from 02/03/2024 in [facility] Office Visit from 12/17/2023 in [facility] Counselor from 05/07/2023 in [facility]  Total GAD-7 Score 10 4 12 13 9    PHQ2-9    Flowsheet Row Office Visit from 07/09/2024 in [facility] Office Visit from 03/08/2024 in [name] Office Visit from 02/03/2024 in [facility] Office Visit from 12/17/2023 in [facility] Video Visit from 10/24/2023 in [facility]  PHQ-2 Total Score 0 0 4 1 1   PHQ-9 Total Score 9 3 14 10 11    Flowsheet Row Video Visit from 07/23/2024 in [facility] Video Visit from 06/21/2024 in [facility] Video Visit from 03/18/2024 in [facility]  C-SSRS RISK CATEGORY No Risk No Risk No Risk     Assessment and Plan: BREAHNA BOYLEN is a 59 year old female who has a history of MDD, PTSD, OCD, grief was evaluated by telemedicine today.  Discussed assessment and plan as noted below.  MDD in remission Currently well-managed on current medication regimen. Continue Wellbutrin  SR 100 mg twice daily Discontinue Wellbutrin  XL 150 mg daily.  Instructions provided to patient again. Continue Sertraline  50 mg daily Continue Topiramate  150 mg daily in divided dosage.  Obsessive-compulsive disorder-stable Currently reports OCD symptoms is well-managed on the current medication regimen. Continue Sertraline  50 mg daily Continue Hydroxyzine  25 mg 3 times a day as needed Continue Clonazepam  0.5 mg 3 times a day as needed Reviewed [place] PMP AWARxE  PTSD-stable Currently denies any significant trauma related symptoms.  Her sleep is currently good. Continue Sertraline  50 mg daily Continue Clonazepam  as  prescribed  Attention and focus deficit-ambulatory referral to neuropsychologist for rule out ADHD.  Follow-up Follow-up in clinic in 2 to 3 months or sooner if needed. Collaboration of Care: Collaboration of Care: Other patient referred for ADHD testing.  Patient/Guardian was advised Release of Information must be obtained prior to any record release in order to collaborate their care with an outside provider. Patient/Guardian was advised if they have not already done so to contact the registration department to sign all necessary forms in order for us  to release information regarding their care.   Consent: Patient/Guardian gives verbal consent for treatment and assignment of benefits for services provided during this visit. Patient/Guardian expressed understanding and agreed to proceed.   This note was generated in part or whole with voice recognition software. Voice recognition is usually quite accurate but there are transcription errors that can and very often do occur. I apologize for any typographical errors that were not detected and corrected.    Plummer Matich, MD 07/23/2024, 2:20 PM

## 2024-07-25 ENCOUNTER — Other Ambulatory Visit: Payer: Self-pay | Admitting: Psychiatry

## 2024-07-25 DIAGNOSIS — F4310 Post-traumatic stress disorder, unspecified: Secondary | ICD-10-CM

## 2024-07-25 DIAGNOSIS — F3342 Major depressive disorder, recurrent, in full remission: Secondary | ICD-10-CM

## 2024-07-27 NOTE — Progress Notes (Signed)
 Yes it is recurrent

## 2024-08-02 ENCOUNTER — Encounter: Payer: Self-pay | Admitting: Psychology

## 2024-08-09 ENCOUNTER — Other Ambulatory Visit: Payer: Self-pay | Admitting: Psychiatry

## 2024-08-09 DIAGNOSIS — F3341 Major depressive disorder, recurrent, in partial remission: Secondary | ICD-10-CM

## 2024-08-09 DIAGNOSIS — F4310 Post-traumatic stress disorder, unspecified: Secondary | ICD-10-CM

## 2024-08-23 ENCOUNTER — Other Ambulatory Visit: Payer: Self-pay | Admitting: Psychiatry

## 2024-08-23 DIAGNOSIS — F411 Generalized anxiety disorder: Secondary | ICD-10-CM

## 2024-09-09 ENCOUNTER — Other Ambulatory Visit: Payer: Self-pay | Admitting: Family Medicine

## 2024-09-15 ENCOUNTER — Ambulatory Visit
Admission: RE | Admit: 2024-09-15 | Discharge: 2024-09-15 | Disposition: A | Discharge status: Home / Self Care | Source: Ambulatory Visit | Attending: Physician Assistant | Admitting: Physician Assistant

## 2024-09-15 ENCOUNTER — Other Ambulatory Visit: Payer: Self-pay | Admitting: Physician Assistant

## 2024-09-15 DIAGNOSIS — R519 Headache, unspecified: Secondary | ICD-10-CM | POA: Diagnosis present

## 2024-09-15 DIAGNOSIS — H539 Unspecified visual disturbance: Secondary | ICD-10-CM | POA: Insufficient documentation

## 2024-09-20 ENCOUNTER — Other Ambulatory Visit: Payer: Self-pay | Admitting: Psychiatry

## 2024-09-20 DIAGNOSIS — F411 Generalized anxiety disorder: Secondary | ICD-10-CM

## 2024-10-11 ENCOUNTER — Ambulatory Visit: Admitting: Family Medicine

## 2024-10-12 ENCOUNTER — Encounter: Payer: Self-pay | Admitting: Psychiatry

## 2024-10-12 ENCOUNTER — Telehealth: Admitting: Psychiatry

## 2024-10-12 DIAGNOSIS — R41840 Attention and concentration deficit: Secondary | ICD-10-CM

## 2024-10-12 DIAGNOSIS — F3342 Major depressive disorder, recurrent, in full remission: Secondary | ICD-10-CM | POA: Diagnosis not present

## 2024-10-12 DIAGNOSIS — F429 Obsessive-compulsive disorder, unspecified: Secondary | ICD-10-CM | POA: Diagnosis not present

## 2024-10-12 DIAGNOSIS — F4310 Post-traumatic stress disorder, unspecified: Secondary | ICD-10-CM | POA: Diagnosis not present

## 2024-10-12 MED ORDER — ESZOPICLONE 1 MG PO TABS: 1.0000 mg | ORAL_TABLET | Freq: Every evening | ORAL | 0 refills | Status: DC | PRN
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
## 2024-10-17 ENCOUNTER — Other Ambulatory Visit: Payer: Self-pay | Admitting: Psychiatry

## 2024-10-17 DIAGNOSIS — F3342 Major depressive disorder, recurrent, in full remission: Secondary | ICD-10-CM

## 2024-10-17 DIAGNOSIS — F4310 Post-traumatic stress disorder, unspecified: Secondary | ICD-10-CM

## 2024-10-26 ENCOUNTER — Ambulatory Visit: Payer: Self-pay

## 2024-10-26 ENCOUNTER — Ambulatory Visit: Admitting: Family Medicine

## 2024-10-26 DIAGNOSIS — Z0000 Encounter for general adult medical examination without abnormal findings: Secondary | ICD-10-CM

## 2024-10-26 NOTE — Patient Instructions (Addendum)
 Melinda Shaw,  Thank you for taking the time for your Medicare Wellness Visit. I appreciate your continued commitment to your health goals. Please review the care plan we discussed, and feel free to reach out if I can assist you further.  Please note that Annual Wellness Visits do not include a physical exam. Some assessments may be limited, especially if the visit was conducted virtually. If needed, we may recommend an in-person follow-up with your provider.  Ongoing Care Seeing your primary care provider every 3 to 6 months helps us  monitor your health and provide consistent, personalized care.   Referrals If a referral was made during today's visit and you haven't received any updates within two weeks, please contact the referred provider directly to check on the status.  Recommended Screenings:  Health Maintenance  Topic Date Due   Pap with HPV screening  01/09/2020   Hepatitis B Vaccine (2 of 3 - 19+ 3-dose series) 03/30/2021   COVID-19 Vaccine (4 - 2025-26 season) 07/12/2024   Zoster (Shingles) Vaccine (2 of 2) 09/03/2024   Breast Cancer Screening  04/30/2025   Medicare Annual Wellness Visit  10/26/2025   DTaP/Tdap/Td vaccine (2 - Td or Tdap) 05/25/2028   Colon Cancer Screening  01/14/2034   Pneumococcal Vaccine for age over 50  Completed   Flu Shot  Completed   Hepatitis C Screening  Completed   HIV Screening  Completed   HPV Vaccine  Aged Out   Meningitis B Vaccine  Aged Out     Vision: Annual vision screenings are recommended for early detection of glaucoma, cataracts, and diabetic retinopathy. These exams can also reveal signs of chronic conditions such as diabetes and high blood pressure.  Dental: Annual dental screenings help detect early signs of oral cancer, gum disease, and other conditions linked to overall health, including heart disease and diabetes.  Please see the attached documents for additional preventive care recommendations.   NEXT AWV 11/01/25 @ 10:10 AM  BY VIDEO

## 2024-10-26 NOTE — Progress Notes (Signed)
 Chief Complaint  Patient presents with   Medicare Wellness     Subjective:   Melinda Shaw is a 58 y.o. female who presents for a Medicare Annual Wellness Visit.  Visit info / Clinical Intake: Medicare Wellness Visit Type:: Subsequent Annual Wellness Visit Persons participating in visit and providing information:: patient Medicare Wellness Visit Mode:: Telephone If telephone:: video error Since this visit was completed virtually, some vitals may be partially provided or unavailable. Missing vitals are due to the limitations of the virtual format.: Unable to obtain vitals - no equipment If Telephone or Video please confirm:: I connected with patient using audio/video enable telemedicine. I verified patient identity with two identifiers, discussed telehealth limitations, and patient agreed to proceed. Patient Location:: home Provider Location:: office Interpreter Needed?: No Pre-visit prep was completed: yes AWV questionnaire completed by patient prior to visit?: yes Date:: 10/26/24 Living arrangements:: lives with spouse/significant other Patient's Overall Health Status Rating: (!) fair Typical amount of pain: (!) a lot Does pain affect daily life?: (!) yes Are you currently prescribed opioids?: no  Dietary Habits and Nutritional Risks How many meals a day?: (!) 1 Eats fruit and vegetables daily?: yes Most meals are obtained by: preparing own meals In the last 2 weeks, have you had any of the following?: (!) nausea, vomiting, diarrhea (has nausea a lot) Diabetic:: no  Functional Status Activities of Daily Living (to include ambulation/medication): Independent Ambulation: Independent Medication Administration: Independent Home Management (perform basic housework or laundry): Independent Manage your own finances?: yes Primary transportation is: driving Concerns about vision?: no *vision screening is required for [facility]* (wears glasses all day- Dr.Porfilio) Concerns about  hearing?: no  Fall Screening Falls in the past year?: 0 Number of falls in past year: 0 Was there an injury with Fall?: 0 Fall Risk Category Calculator: 0 Patient Fall Risk Level: Low Fall Risk  Fall Risk Patient at Risk for Falls Due to: No Fall Risks Fall risk Follow up: Falls evaluation completed; Falls prevention discussed  Home and Transportation Safety: All rugs have non-skid backing?: yes All stairs or steps have railings?: (!) no Grab bars in the bathtub or shower?: yes Have non-skid surface in bathtub or shower?: (!) no Good home lighting?: (!) no Regular seat belt use?: yes Hospital stays in the last year:: no  Cognitive Assessment Difficulty concentrating, remembering, or making decisions? : yes (HAS ADD)    Allergies (verified) Cherry, Other, Shellfish allergy, Sulfa antibiotics, Bee venom, and Imdur  [isosorbide  nitrate]   Current Medications (verified) Outpatient Encounter Medications as of 10/26/2024  Medication Sig   aspirin  EC 81 MG tablet Take 81 mg by mouth daily.   buPROPion  ER (WELLBUTRIN  SR) 100 MG 12 hr tablet TAKE 1 TABLET BY MOUTH 2 TIMES A DAY AT 7AM AND 1:00PM   carvedilol  (COREG ) 12.5 MG tablet Take 1 tablet (12.5 mg total) by mouth 2 (two) times daily.   clonazePAM  (KLONOPIN ) 0.5 MG tablet TAKE 1 TABLET BY MOUTH 3 TIMES A WEEK ONLY FOR SEVERE ANXIETY ATTACKS   cyanocobalamin (VITAMIN B12) 1000 MCG/ML injection INJECT 1 ML INTO THE MUSCLE EVERY THIRTY DAYS   dicyclomine  (BENTYL ) 10 MG capsule Take 1 capsule (10 mg total) by mouth 3 (three) times daily before meals.   docusate sodium  (COLACE) 100 MG capsule Take 1 capsule (100 mg total) by mouth 2 (two) times daily as needed for mild constipation.   EPINEPHrine  0.3 mg/0.3 mL IJ SOAJ injection Inject 0.3 mg into the muscle as needed for anaphylaxis.  eszopiclone  (LUNESTA ) 1 MG TABS tablet Take 1 tablet (1 mg total) by mouth at bedtime as needed for sleep. Take immediately before bedtime   famotidine   (PEPCID ) 20 MG tablet TAKE 1 TABLET BY MOUTH AT BEDTIME   Galcanezumab-gnlm 120 MG/ML SOAJ Inject into the skin every 30 (thirty) days.   hydrOXYzine  (VISTARIL ) 25 MG capsule Take 1 capsule (25 mg total) by mouth 3 (three) times daily as needed for anxiety. And sleep   LINZESS  290 MCG CAPS capsule Take 1 capsule (290 mcg total) by mouth daily.   naltrexone  (DEPADE) 50 MG tablet Take 1 tablet (50 mg total) by mouth daily.   nitroGLYCERIN  (NITROSTAT ) 0.4 MG SL tablet PLACE ONE TABLET UNDER THE TONGUE EVERY 5 MINUTES AS NEEDED FOR CHEST PAIN. MAXIMUM OF 3 DOSES.   omeprazole  (PRILOSEC) 40 MG capsule Take 1 capsule (40 mg total) by mouth daily.   ondansetron  (ZOFRAN  ODT) 4 MG disintegrating tablet Take 1 tablet (4 mg total) by mouth every 8 (eight) hours as needed for nausea or vomiting.   potassium chloride  SA (KLOR-CON  M20) 20 MEQ tablet Take 1 tablet (20 mEq total) by mouth 2 (two) times daily.   rosuvastatin  (CRESTOR ) 10 MG tablet Take 1 tablet (10 mg total) by mouth at bedtime.   sertraline  (ZOLOFT ) 50 MG tablet TAKE 1 TABLET BY MOUTH DAILY WITH BREAKFAST   tiZANidine  (ZANAFLEX ) 4 MG tablet Take 4 mg by mouth once.   topiramate  (TOPAMAX ) 50 MG tablet TAKE 1 TABLET BY MOUTH AT 11:00AM AND TAKE 2 TABLETS BY MOUTH EVERY NIGHT AT BEDTIME   ondansetron  (ZOFRAN ) 4 MG tablet Take 1 tablet by mouth every 8 (eight) hours as needed. (Patient not taking: Reported on 07/21/2024)   No facility-administered encounter medications on file as of 10/26/2024.    History: Past Medical History:  Diagnosis Date   Allergy 2007   Anemia    H/O   Anxiety    Arthritis    Back / shoulder   Bronchitis    Cancer (HCC) 2020   Ovary & ReSecetion apendiseal cancer   Chest pain    a. 09/2018 Cath: Nl cors. EF 65%->Med rx (long-acting nitrate added).   COPD (chronic obstructive pulmonary disease) (HCC) Many years  Ive required puffers   Depression    Diastolic dysfunction    a. 10/2018 Echo: EF 55-60%, no rwma,  Gr1DD. Mild MR. Nl RV fxn.   DOE (dyspnea on exertion)    Fibromyalgia    GERD (gastroesophageal reflux disease)    Headache    Hyperlipidemia    Hypertension    no meds   Palpitations    a. 01/2019 [facility]): NSR w/ rare PACs & PVCs; b. 04/2020 [facility]: Predominantly sinus rhythm @ 75 (48-141). Rare PACs/PVCs. No significant arrhythmias or prolonged pauses.  No triggered events.   PONV (postoperative nausea and vomiting)    Seizures (HCC)    Stroke (HCC) 2018   Confusion/ first mumbling felloll the porch didnt know  it was a stroke   Past Surgical History:  Procedure Laterality Date   ANTERIOR CRUCIATE LIGAMENT REPAIR     APPENDECTOMY     CARDIAC CATHETERIZATION     CESAREAN SECTION  1989   CHOLECYSTECTOMY     COLON SURGERY     Appendiceal resection surgery 2020   CORONARY ANGIOPLASTY     ESOPHAGOGASTRODUODENOSCOPY (EGD) WITH PROPOFOL  N/A 08/28/2015   Procedure: ESOPHAGOGASTRODUODENOSCOPY (EGD) WITH PROPOFOL ;  Surgeon: Donnice Vaughn Manes, MD;  Location: [facility] ENDOSCOPY;  Service:  Endoscopy;  Laterality: N/A;   ESOPHAGOGASTRODUODENOSCOPY (EGD) WITH PROPOFOL  N/A 01/31/2022   Procedure: ESOPHAGOGASTRODUODENOSCOPY (EGD) WITH PROPOFOL ;  Surgeon: Therisa Bi, MD;  Location: [facility] ENDOSCOPY;  Service: Gastroenterology;  Laterality: N/A;   JOINT REPLACEMENT     Knee /Acl   KNEE SURGERY     left knee    LEFT HEART CATH AND CORONARY ANGIOGRAPHY Left 09/22/2018   Procedure: LEFT HEART CATH AND CORONARY ANGIOGRAPHY;  Surgeon: Mady Bruckner, MD;  Location: [facility] INV[facility]IVE CV [facility];  Service: Cardiovascular;  Laterality: Left;   MASS EXCISION Left 01/26/2016   Procedure: EXCISION OF LEFT WRIST VOLAR MASS;  Surgeon: Donnice Robinsons, MD;  Location: [facility];  Service: Orthopedics;  Laterality: Left;   NOSE SURGERY     POLYPECTOMY     TUBAL LIGATION     VIDEO BRONCHOSCOPY N/A 05/29/2015   Procedure: VIDEO BRONCHOSCOPY WITHOUT FLUORO;  Surgeon: Jorie Cha, MD;  Location: [facility]  ORS;  Service: Cardiopulmonary;  Laterality: N/A;   Family History  Problem Relation Age of Onset   Dementia Mother    Alcohol abuse Mother    COPD Mother    High Cholesterol Mother    Heart failure Mother    Hypertension Mother    Depression Mother    Heart disease Mother    Dementia Father    Alcohol abuse Father    Hypertension Father    AAA (abdominal aortic aneurysm) Sister    COPD Sister    High Cholesterol Sister    Social History   Occupational History   Not on file  Tobacco Use   Smoking status: Never   Smokeless tobacco: Never  Vaping Use   Vaping status: Never Used  Substance and Sexual Activity   Alcohol use: No    Alcohol/week: 0.0 standard drinks of alcohol   Drug use: No   Sexual activity: Yes   Tobacco Counseling Counseling given: Not Answered  SDOH Screenings   Food Insecurity: Food Insecurity Present (10/25/2024)  Housing: High Risk (10/25/2024)  Transportation Needs: Unmet Transportation Needs (10/25/2024)  Utilities: Not At Risk (10/21/2023)  Recent Concern: Utilities - At Risk (10/17/2023)  Alcohol Screen: Low Risk (10/25/2024)  Depression (PHQ2-9): Medium Risk (07/09/2024)  Financial Resource Strain: Patient Declined (10/25/2024)  Physical Activity: Sufficiently Active (07/06/2024)  Social Connections: Unknown (10/25/2024)  Stress: Stress Concern Present (07/06/2024)  Tobacco Use: Low Risk (10/26/2024)  Health Literacy: Adequate Health Literacy (10/21/2023)   See flowsheets for full screening details  Depression Screen PHQ 2 & 9 Depression Scale- Over the past 2 weeks, how often have you been bothered by any of the following problems? Little interest or pleasure in doing things: 0 Feeling down, depressed, or hopeless (PHQ Adolescent also includes...irritable): 0 PHQ-2 Total Score: 0 Trouble falling or staying asleep, or sleeping too much: 2 Feeling tired or having little energy: 2 Poor appetite or overeating (PHQ Adolescent also  includes...weight loss): 1 Feeling bad about yourself - or that you are a failure or have let yourself or your family down: 0 Trouble concentrating on things, such as reading the newspaper or watching television (PHQ Adolescent also includes...like school work): 1 Moving or speaking so slowly that other people could have noticed. Or the opposite - being so fidgety or restless that you have been moving around a lot more than usual: 3 Thoughts that you would be better off dead, or of hurting yourself in some way: 0 PHQ-9 Total Score: 9 If you checked off any problems, how difficult have  these problems made it for you to do your work, take care of things at home, or get along with other people?: Not difficult at all     Goals Addressed             This Visit's Progress    Cut out extra servings               Objective:    There were no vitals filed for this visit. There is no height or weight on file to calculate BMI.  Hearing/Vision screen No results found. Immunizations and Health Maintenance Health Maintenance  Topic Date Due   Cervical Cancer Screening (HPV/Pap Cotest)  01/09/2020   Hepatitis B Vaccines 19-59 Average Risk (2 of 3 - 19+ 3-dose series) 03/30/2021   COVID-19 Vaccine (4 - 2025-26 season) 07/12/2024   Zoster Vaccines- Shingrix  (2 of 2) 09/03/2024   Mammogram  04/30/2025   Medicare Annual Wellness (AWV)  10/26/2025   DTaP/Tdap/Td (2 - Td or Tdap) 05/25/2028   Colonoscopy  01/14/2034   Pneumococcal Vaccine: 50+ Years  Completed   Influenza Vaccine  Completed   Hepatitis C Screening  Completed   HIV Screening  Completed   HPV VACCINES  Aged Out   Meningococcal B Vaccine  Aged Out        Assessment/Plan:  This is a routine wellness examination for Melinda Shaw.  Patient Care Team: Donzella Lauraine SAILOR, DO as PCP - General (Family Medicine) End, Lonni, MD as PCP - Cardiology (Cardiology) Cindie Jesusa HERO, RN as Registered Nurse Dannielle Arlean FALCON, RN  (Inactive) as Registered Nurse Pa, [facility])  I have personally reviewed and noted the following in the patients chart:   Medical and social history Use of alcohol, tobacco or illicit drugs  Current medications and supplements including opioid prescriptions. Functional ability and status Nutritional status Physical activity Advanced directives List of other physicians Hospitalizations, surgeries, and ER visits in previous 12 months Vitals Screenings to include cognitive, depression, and falls Referrals and appointments  No orders of the defined types were placed in this encounter.  In addition, I have reviewed and discussed with patient certain preventive protocols, quality metrics, and best practice recommendations. A written personalized care plan for preventive services as well as general preventive health recommendations were provided to patient.   Jhonnie GORMAN Das, LPN   [id number]   Return in 1 year (on 10/26/2025).  After Visit Summary: (MyChart) Due to this being a telephonic visit, the after visit summary with patients personalized plan was offered to patient via MyChart   Nurse Notes: UTD ON SHOTS; UTD ON MAMMOGRAM, COLONOSCOPY

## 2024-10-27 ENCOUNTER — Encounter: Payer: Self-pay | Admitting: Psychiatry

## 2024-10-27 ENCOUNTER — Telehealth: Admitting: Psychiatry

## 2024-10-27 DIAGNOSIS — R41840 Attention and concentration deficit: Secondary | ICD-10-CM

## 2024-10-27 DIAGNOSIS — F3342 Major depressive disorder, recurrent, in full remission: Secondary | ICD-10-CM

## 2024-10-27 DIAGNOSIS — F429 Obsessive-compulsive disorder, unspecified: Secondary | ICD-10-CM | POA: Diagnosis not present

## 2024-10-27 DIAGNOSIS — F4310 Post-traumatic stress disorder, unspecified: Secondary | ICD-10-CM | POA: Diagnosis not present

## 2024-10-27 MED ORDER — TOPIRAMATE 50 MG PO TABS: 150.0000 mg | ORAL_TABLET | Freq: Every day | ORAL | 2 refills | Status: AC

## 2024-10-27 MED ORDER — CLONAZEPAM 0.5 MG PO TABS: 0.5000 mg | ORAL_TABLET | Freq: Every day | ORAL | 0 refills | Status: AC | PRN

## 2024-10-27 MED ORDER — ESZOPICLONE 1 MG PO TABS: 1.0000 mg | ORAL_TABLET | Freq: Every evening | ORAL | 1 refills | Status: AC | PRN
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
## 2024-11-08 ENCOUNTER — Ambulatory Visit: Payer: Self-pay

## 2024-11-08 NOTE — Telephone Encounter (Signed)
 Patient has an appointment on 11/30/2024 for a 3 month weight and labs that she had to push back and was wondering if this could be taken care of during this acute appointment as well.  FYI Only or Action Required?: FYI only for provider: appointment scheduled on 11/09/2024 at 8am with PCP Lauraine Buoy, DO.  Patient was last seen in primary care on 07/09/2024 by Wellington Curtis LABOR, FNP.  Called Nurse Triage reporting Rash.  Symptoms began 5-6 days ago.  Interventions attempted: OTC medications: antibacterial cream and Rest, hydration, or home remedies.  Symptoms are: gradually worsening.  Triage Disposition: See Physician Within 24 Hours  Patient/caregiver understands and will follow disposition?: Yes      Reason for Disposition  [1] Shingles rash AND [2] onset > 72 hours ago (3 days)  Answer Assessment - Initial Assessment Questions Rash on right shoulder, right side of back, right side of neck up to right ear Also on left shoulder up to left ear Patient states it feels like shingles and she has had that in the past Areas are the size of the bottom of a cup   Patient is advised to call us  back if anything changes or with any further questions/concerns. Patient is advised that if anything worsens to go to the Emergency Room. Patient verbalized understanding.  Answer Assessment - Initial Assessment Questions Rash on right shoulder, right side of back, right side of neck up to right ear Also on left shoulder up to left ear Patient states it feels like shingles and she has had that in the past Areas are the size of the bottom of a cup Patient has been using an antibacterial cream but it isnt helping She states the areas feel like a stinging and itch off and on  Patient is advised to call us  back if anything changes or with any further questions/concerns. Patient is advised that if anything worsens to go to the Emergency Room. Patient verbalized understanding.  Protocols  used: Rash or Redness - Widespread-A-AH, Shingles (Zoster)-A-AH

## 2024-11-09 ENCOUNTER — Other Ambulatory Visit (HOSPITAL_COMMUNITY): Payer: Self-pay

## 2024-11-09 ENCOUNTER — Telehealth: Payer: Self-pay | Admitting: Family Medicine

## 2024-11-09 ENCOUNTER — Encounter: Payer: Self-pay | Admitting: Family Medicine

## 2024-11-09 ENCOUNTER — Telehealth: Payer: Self-pay

## 2024-11-09 ENCOUNTER — Other Ambulatory Visit: Payer: Self-pay

## 2024-11-09 ENCOUNTER — Ambulatory Visit: Admitting: Family Medicine

## 2024-11-09 VITALS — BP 114/69 | HR 63 | Temp 97.7°F | Ht 60.0 in | Wt 156.2 lb

## 2024-11-09 DIAGNOSIS — R21 Rash and other nonspecific skin eruption: Secondary | ICD-10-CM

## 2024-11-09 DIAGNOSIS — I10 Essential (primary) hypertension: Secondary | ICD-10-CM

## 2024-11-09 DIAGNOSIS — E66811 Obesity, class 1: Secondary | ICD-10-CM

## 2024-11-09 DIAGNOSIS — K2100 Gastro-esophageal reflux disease with esophagitis, without bleeding: Secondary | ICD-10-CM

## 2024-11-09 MED ORDER — VALACYCLOVIR HCL 1 G PO TABS: 1000.0000 mg | ORAL_TABLET | Freq: Three times a day (TID) | ORAL | 0 refills | Status: AC

## 2024-11-09 MED ORDER — OMEPRAZOLE 40 MG PO CPDR: 40.0000 mg | DELAYED_RELEASE_CAPSULE | Freq: Every day | ORAL | 1 refills | Status: AC

## 2024-11-09 MED ORDER — WEGOVY 0.25 MG/0.5ML ~~LOC~~ SOAJ: 0.2500 mg | SUBCUTANEOUS | 0 refills | Status: DC

## 2024-11-09 NOTE — Telephone Encounter (Signed)
[facility] faxed refill request for the following medications:   omeprazole (PRILOSEC) 40 MG capsule    Please advise.  

## 2024-11-09 NOTE — Assessment & Plan Note (Signed)
 Chronic, stable on carvedilol  12.5 BID. No changes today. Follows with cardiology, Dr. Mady; defer to specialist management.

## 2024-11-09 NOTE — Progress Notes (Signed)
 "     Established patient visit   Patient: Melinda Shaw   DOB: 14-Aug-1966   58 y.o. Female  MRN: [id number] Visit Date: 11/09/2024  Today's healthcare provider: LAURAINE LOISE BUOY, DO   Chief Complaint  Patient presents with   Acute Visit    Patient is here  Rash--possible shingles--right shoulder up to right ear & on left shoulder x 5-6 days, noticed last night she also has spots on both shoulders as well as the back of her neck.    Subjective    HPI Melinda Shaw is a 58 year old female who presents with a rash on her shoulders.  She has a rash on both shoulders that has been present for five to six days. The rash is intensely pruritic and feels like 'needles sticking' or 'pin pricks'. It has spread from her shoulders to her ear. The sensation is similar to her previous episode of shingles, which affected her back and side. Her husband initially observed the rash and described it as blistery. No new exposures or changes in medications or diet.  She has a history of shingles and has been vaccinated against it. She recalls a previous episode of shingles approximately three years ago, during which she experienced significant pain. She has been diligent with her vaccinations, although she recalls experiencing an adverse reaction when she received the shingles and flu vaccines simultaneously.  She mentions feeling extremely fatigued, lacking strength, and having no desire to move around, which she attributes to her 'crap' immune system. 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  She is currently on carvedilol  for blood pressure management, which was recently changed from diltiazem  due to inefficacy. She also takes topiramate  for migraines, which she  notes has been used for appetite suppression as well. She has not taken naltrexone  recently, which was prescribed for weight loss.  She is up to date on her vaccinations, including the hepatitis B series, and has received the shingles vaccine, although there was confusion about the number of doses received.      Medications: Show/hide medication list[1]       Objective    BP 114/69 (BP Location: Left Arm, Patient Position: Sitting, Cuff Size: Normal)   Pulse 63   Temp 97.7 F (36.5 C) (Oral)   Ht 5' (1.524 m)   Wt 156 lb 3.2 oz (70.9 kg)   LMP 09/24/2016   SpO2 100%   BMI 30.51 kg/m     Physical Exam Vitals and nursing note reviewed.  Constitutional:      General: She is not in acute distress.    Appearance: Normal appearance.  HENT:     Head: Normocephalic and atraumatic.  Eyes:     General: No scleral icterus.    Conjunctiva/sclera: Conjunctivae normal.  Cardiovascular:     Rate and Rhythm: Normal rate.  Pulmonary:     Effort: Pulmonary effort is normal.  Skin:    Findings: Rash (papular rash, scabbed, dry, with mild redness around it) present.      Neurological:     Mental Status: She is alert and oriented to person, place, and time. Mental status is at baseline.  Psychiatric:        Mood and Affect: Mood normal.  Behavior: Behavior normal.      No results found for any visits on 11/09/24.  Assessment & Plan    Rash -     valACYclovir HCl; Take 1 tablet (1,000 mg total) by mouth 3 (three) times daily for 7 days.  Dispense: 21 tablet; Refill: 0  Obesity (BMI 30.0-34.9) -     Tzhncb; Inject 0.25 mg into the skin once a week.  Dispense: 2 mL; Refill: 0  Essential hypertension Assessment & Plan: Chronic, stable on carvedilol  12.5 BID. No changes today. Follows with cardiology, Dr. Mady; defer to specialist management.       Rash Rash consistent with shingles, immune system compromised due to past liver issues. - Prescribed valacyclovir to  with goal of preventing persistent postherpetic neuralgia.  Obesity (BMI 30.0-34.9) Weight management with naltrexone  and bupropion . Phentermine not recommended due to history of chest pain and palpitations. Discussed injectable weight loss medications pending insurance coverage. - Prescribe Wegovy pending insurance coverage. - Plan to increase Wegovy to 0.5 mg dose after four weeks if tolerated. - Discussed ongoing lifestyle modifications with goal of 150 minutes/week of moderate-intensity physical activity. - Discussed emphasis of caloric restriction of 1600 cal/day with heart healthy dietary choices.  General health maintenance Up to date on shingles and hepatitis B vaccinations. Due for Pap smear.    Return in about 6 weeks (around 12/21/2024) for Weight, Pap and in 6 months with Dr. Franchot.      I discussed the assessment and treatment plan with the patient  The patient was provided an opportunity to ask questions and all were answered. The patient agreed with the plan and demonstrated an understanding of the instructions.   The patient was advised to call back or seek an in-person evaluation if the symptoms worsen or if the condition fails to improve as anticipated.    LAURAINE LOISE BUOY, DO  [facility] [phone] (phone) [phone] (fax)  [facility]    [1]  Outpatient Medications Prior to Visit  Medication Sig   aspirin  EC 81 MG tablet Take 81 mg by mouth daily.   buPROPion  ER (WELLBUTRIN  SR) 100 MG 12 hr tablet TAKE 1 TABLET BY MOUTH 2 TIMES A DAY AT 7AM AND 1:00PM   carvedilol  (COREG ) 12.5 MG tablet Take 1 tablet (12.5 mg total) by mouth 2 (two) times daily.   clonazePAM  (KLONOPIN ) 0.5 MG tablet Take 1 tablet (0.5 mg total) by mouth daily as needed for anxiety. Please limit use   cyanocobalamin (VITAMIN B12) 1000 MCG/ML injection INJECT 1 ML INTO THE MUSCLE EVERY THIRTY DAYS   dicyclomine  (BENTYL ) 10 MG capsule Take 1 capsule  (10 mg total) by mouth 3 (three) times daily before meals.   docusate sodium  (COLACE) 100 MG capsule Take 1 capsule (100 mg total) by mouth 2 (two) times daily as needed for mild constipation.   EPINEPHrine  0.3 mg/0.3 mL IJ SOAJ injection Inject 0.3 mg into the muscle as needed for anaphylaxis.   [START ON 11/10/2024] eszopiclone  (LUNESTA ) 1 MG TABS tablet Take 1 tablet (1 mg total) by mouth at bedtime as needed for sleep. Take immediately before bedtime   famotidine  (PEPCID ) 20 MG tablet TAKE 1 TABLET BY MOUTH AT BEDTIME   Galcanezumab-gnlm 120 MG/ML SOAJ Inject into the skin every 30 (thirty) days.   hydrOXYzine  (VISTARIL ) 25 MG capsule Take 1 capsule (25 mg total) by mouth 3 (three) times daily as needed for anxiety. And sleep   LINZESS  290 MCG CAPS  capsule Take 1 capsule (290 mcg total) by mouth daily.   naltrexone  (DEPADE) 50 MG tablet Take 1 tablet (50 mg total) by mouth daily.   nitroGLYCERIN  (NITROSTAT ) 0.4 MG SL tablet PLACE ONE TABLET UNDER THE TONGUE EVERY 5 MINUTES AS NEEDED FOR CHEST PAIN. MAXIMUM OF 3 DOSES.   ondansetron  (ZOFRAN  ODT) 4 MG disintegrating tablet Take 1 tablet (4 mg total) by mouth every 8 (eight) hours as needed for nausea or vomiting.   potassium chloride  SA (KLOR-CON  M20) 20 MEQ tablet Take 1 tablet (20 mEq total) by mouth 2 (two) times daily.   rosuvastatin  (CRESTOR ) 10 MG tablet Take 1 tablet (10 mg total) by mouth at bedtime.   sertraline  (ZOLOFT ) 50 MG tablet TAKE 1 TABLET BY MOUTH DAILY WITH BREAKFAST   tiZANidine  (ZANAFLEX ) 4 MG tablet Take 4 mg by mouth once.   topiramate  (TOPAMAX ) 50 MG tablet Take 3 tablets (150 mg total) by mouth daily.   [DISCONTINUED] omeprazole  (PRILOSEC) 40 MG capsule Take 1 capsule (40 mg total) by mouth daily.   [DISCONTINUED] ondansetron  (ZOFRAN ) 4 MG tablet Take 1 tablet by mouth every 8 (eight) hours as needed. (Patient not taking: Reported on 07/21/2024)   No facility-administered medications prior to visit.   "

## 2024-11-09 NOTE — Telephone Encounter (Signed)
 Converted to rx request

## 2024-11-09 NOTE — Telephone Encounter (Signed)
 Pharmacy Patient Advocate Encounter   Received notification from Onbase that prior authorization for [facility] 0.25MG /0.5ML auto-injectors  is required/requested.   Insurance verification completed.   The patient is insured through [facility] ADVANTAGE/RX ADVANCE.   Per test claim: Per test claim, medication is not covered due to plan/benefit exclusion, PA not submitted at this time  Product not on formulary

## 2024-11-13 ENCOUNTER — Encounter: Payer: Self-pay | Admitting: Family Medicine

## 2024-11-15 ENCOUNTER — Encounter: Payer: Self-pay | Admitting: Family Medicine

## 2024-11-15 DIAGNOSIS — E66811 Obesity, class 1: Secondary | ICD-10-CM

## 2024-11-15 DIAGNOSIS — Z8673 Personal history of transient ischemic attack (TIA), and cerebral infarction without residual deficits: Secondary | ICD-10-CM

## 2024-11-15 DIAGNOSIS — R21 Rash and other nonspecific skin eruption: Secondary | ICD-10-CM

## 2024-11-16 ENCOUNTER — Other Ambulatory Visit: Payer: Self-pay

## 2024-11-16 DIAGNOSIS — E66811 Obesity, class 1: Secondary | ICD-10-CM

## 2024-11-16 MED ORDER — WEGOVY 0.25 MG/0.5ML ~~LOC~~ SOAJ: 0.2500 mg | SUBCUTANEOUS | 0 refills | Status: DC

## 2024-11-18 ENCOUNTER — Telehealth: Payer: Self-pay

## 2024-11-18 NOTE — Telephone Encounter (Signed)
 Copied from CRM #[id number]. Topic: Clinical - Prescription Issue >> Nov 18, 2024 11:53 AM Emylou G wrote: Reason for CRM: Patient called.SABRA adv never heard back from her mychart.. She is looking for alternatives since the medication wegovy  isn't covered/expensive. Pls call patient back

## 2024-11-22 ENCOUNTER — Other Ambulatory Visit (HOSPITAL_COMMUNITY): Payer: Self-pay

## 2024-11-22 DIAGNOSIS — Z8673 Personal history of transient ischemic attack (TIA), and cerebral infarction without residual deficits: Secondary | ICD-10-CM | POA: Insufficient documentation

## 2024-11-22 MED ORDER — WEGOVY 0.25 MG/0.5ML ~~LOC~~ SOAJ: 0.2500 mg | SUBCUTANEOUS | 0 refills | Status: AC

## 2024-11-22 MED ORDER — TRIAMCINOLONE ACETONIDE 0.1 % EX CREA: 1.0000 | TOPICAL_CREAM | Freq: Two times a day (BID) | CUTANEOUS | 0 refills | Status: AC

## 2024-11-30 ENCOUNTER — Ambulatory Visit: Admitting: Family Medicine

## 2024-12-07 ENCOUNTER — Emergency Department

## 2024-12-07 ENCOUNTER — Other Ambulatory Visit: Payer: Self-pay

## 2024-12-07 DIAGNOSIS — I10 Essential (primary) hypertension: Secondary | ICD-10-CM | POA: Insufficient documentation

## 2024-12-07 DIAGNOSIS — W000XXA Fall on same level due to ice and snow, initial encounter: Secondary | ICD-10-CM | POA: Diagnosis not present

## 2024-12-07 DIAGNOSIS — M533 Sacrococcygeal disorders, not elsewhere classified: Secondary | ICD-10-CM | POA: Diagnosis present

## 2024-12-07 DIAGNOSIS — S300XXA Contusion of lower back and pelvis, initial encounter: Secondary | ICD-10-CM | POA: Diagnosis not present

## 2024-12-07 DIAGNOSIS — Z8673 Personal history of transient ischemic attack (TIA), and cerebral infarction without residual deficits: Secondary | ICD-10-CM | POA: Insufficient documentation

## 2024-12-07 NOTE — ED Triage Notes (Signed)
 Pt reports she fell outside in the ice and landed on her lower back, pt reports pain in bilateral lower legs and lower back.

## 2024-12-08 ENCOUNTER — Emergency Department
Admission: EM | Admit: 2024-12-08 | Discharge: 2024-12-08 | Disposition: A | Attending: Emergency Medicine | Admitting: Emergency Medicine

## 2024-12-08 ENCOUNTER — Other Ambulatory Visit: Payer: Self-pay | Admitting: Physician Assistant

## 2024-12-08 DIAGNOSIS — S300XXA Contusion of lower back and pelvis, initial encounter: Secondary | ICD-10-CM

## 2024-12-08 MED ORDER — OXYCODONE-ACETAMINOPHEN 5-325 MG PO TABS: 1.0000 | ORAL_TABLET | Freq: Four times a day (QID) | ORAL | 0 refills | Status: AC | PRN

## 2024-12-08 MED ORDER — OXYCODONE-ACETAMINOPHEN 5-325 MG PO TABS
1.0000 | ORAL_TABLET | Freq: Once | ORAL | Status: AC
  Administered 2024-12-08: 1 via ORAL
  Filled 2024-12-08: qty 1

## 2024-12-08 MED ORDER — NAPROXEN 500 MG PO TABS
500.0000 mg | ORAL_TABLET | Freq: Once | ORAL | Status: AC
  Administered 2024-12-08: 500 mg via ORAL
  Filled 2024-12-08: qty 1

## 2024-12-08 MED ORDER — NAPROXEN 500 MG PO TABS: 500.0000 mg | ORAL_TABLET | Freq: Two times a day (BID) | ORAL | 0 refills | Status: AC

## 2024-12-08 NOTE — ED Provider Notes (Signed)
 "  [facility] Provider Note    Event Date/Time   First MD Initiated Contact with Patient 12/08/24 [phone]     (approximate)   History   Fall   HPI  Melinda Shaw is a 59 y.o. female with a history of hypertension, PTSD, cerebellar stroke who comes ED complaining of sacral pain after slip and fall on ice at home.  Also hit her head but denies headache or neck pain.  No lower extremity weakness or paresthesia.     Physical Exam   Triage Vital Signs: ED Triage Vitals  Encounter Vitals Group     BP 12/07/24 2119 (!) 142/92     Girls Systolic BP Percentile --      Girls Diastolic BP Percentile --      Boys Systolic BP Percentile --      Boys Diastolic BP Percentile --      Pulse Rate 12/07/24 2119 80     Resp 12/07/24 2119 18     Temp 12/07/24 2119 98.9 F (37.2 C)     Temp Source 12/08/24 0350 Oral     SpO2 12/07/24 2119 95 %     Weight 12/07/24 2118 155 lb (70.3 kg)     Height 12/07/24 2118 5' (1.524 m)     Head Circumference --      Peak Flow --      Pain Score 12/07/24 2118 10     Pain Loc --      Pain Education --      Exclude from Growth Chart --     Most recent vital signs: Vitals:   12/07/24 2119 12/08/24 0350  BP: (!) 142/92 (!) 155/90  Pulse: 80 64  Resp: 18 18  Temp: 98.9 F (37.2 C) 97.8 F (36.6 C)  SpO2: 95% 100%    General: Awake, no distress.  CV:  Good peripheral perfusion.  Regular rate rhythm Resp:  Normal effort.  Abd:  No distention.  Soft nontender Other:  Head atraumatic.  No midline spinal tenderness.  There is tenderness over the coccyx   ED Results / Procedures / Treatments   Labs (all labs ordered are listed, but only abnormal results are displayed) Labs Reviewed - No data to display   RADIOLOGY X-ray lumbar spine interpreted by me, negative for fracture.  Radiology report reviewed  X-rays bilateral femur negative.  X-ray pelvis unremarkable   PROCEDURES:  Procedures   MEDICATIONS ORDERED  IN ED: Medications  oxyCODONE -acetaminophen  (PERCOCET/ROXICET) 5-325 MG per tablet 1 tablet (1 tablet Oral Given 12/08/24 0431)  naproxen  (NAPROSYN ) tablet 500 mg (500 mg Oral Given 12/08/24 0431)     IMPRESSION / MDM / ASSESSMENT AND PLAN / ED COURSE  I reviewed the triage vital signs and the nursing notes.                              Differential diagnosis includes, but is not limited to, femur fracture, pelvis fracture, lumbar spine fracture, coccyx/tailbone injury, contusion       FINAL CLINICAL IMPRESSION(S) / ED DIAGNOSES   Final diagnoses:  Coccyx contusion, initial encounter     Rx / DC Orders   ED Discharge Orders          Ordered    oxyCODONE -acetaminophen  (PERCOCET) 5-325 MG tablet  Every 6 hours PRN        12/08/24 0423    naproxen  (NAPROSYN ) 500 MG tablet  2 times daily with meals        12/08/24 0423             Note:  This document was prepared using Dragon voice recognition software and may include unintentional dictation errors.   Viviann Pastor, MD 12/08/24 [phone]  "

## 2024-12-13 ENCOUNTER — Other Ambulatory Visit (HOSPITAL_COMMUNITY): Payer: Self-pay

## 2024-12-15 ENCOUNTER — Other Ambulatory Visit (HOSPITAL_COMMUNITY): Payer: Self-pay

## 2024-12-16 ENCOUNTER — Encounter: Admitting: Psychology

## 2024-12-17 ENCOUNTER — Other Ambulatory Visit: Payer: Self-pay

## 2024-12-17 ENCOUNTER — Emergency Department

## 2024-12-17 ENCOUNTER — Other Ambulatory Visit (HOSPITAL_COMMUNITY): Payer: Self-pay

## 2024-12-17 ENCOUNTER — Emergency Department
Admission: EM | Admit: 2024-12-17 | Discharge: 2024-12-17 | Disposition: A | Discharge status: Home / Self Care | Source: Home / Self Care | Attending: Emergency Medicine | Admitting: Emergency Medicine

## 2024-12-17 ENCOUNTER — Ambulatory Visit: Payer: Self-pay | Admitting: *Deleted

## 2024-12-17 DIAGNOSIS — N939 Abnormal uterine and vaginal bleeding, unspecified: Secondary | ICD-10-CM

## 2024-12-17 DIAGNOSIS — S300XXD Contusion of lower back and pelvis, subsequent encounter: Secondary | ICD-10-CM

## 2024-12-17 LAB — CBC
HCT: 39.4 % (ref 36.0–46.0)
Hemoglobin: 13.3 g/dL (ref 12.0–15.0)
MCH: 31.6 pg (ref 26.0–34.0)
MCHC: 33.8 g/dL (ref 30.0–36.0)
MCV: 93.6 fL (ref 80.0–100.0)
Platelets: 204 10*3/uL (ref 150–400)
RBC: 4.21 MIL/uL (ref 3.87–5.11)
RDW: 12.5 % (ref 11.5–15.5)
WBC: 4.9 10*3/uL (ref 4.0–10.5)
nRBC: 0 % (ref 0.0–0.2)

## 2024-12-17 LAB — COMPREHENSIVE METABOLIC PANEL WITH GFR
ALT: 29 U/L (ref 0–44)
AST: 20 U/L (ref 15–41)
Albumin: 4.4 g/dL (ref 3.5–5.0)
Alkaline Phosphatase: 103 U/L (ref 38–126)
Anion gap: 10 (ref 5–15)
BUN: 15 mg/dL (ref 6–20)
CO2: 24 mmol/L (ref 22–32)
Calcium: 9.3 mg/dL (ref 8.9–10.3)
Chloride: 105 mmol/L (ref 98–111)
Creatinine, Ser: 0.91 mg/dL (ref 0.44–1.00)
GFR, Estimated: >60 mL/min
Glucose, Bld: 103 mg/dL — ABNORMAL HIGH (ref 70–99)
Potassium: 3.3 mmol/L — ABNORMAL LOW (ref 3.5–5.1)
Sodium: 138 mmol/L (ref 135–145)
Total Bilirubin: 0.3 mg/dL (ref 0.0–1.2)
Total Protein: 7.4 g/dL (ref 6.5–8.1)

## 2024-12-17 LAB — URINALYSIS, ROUTINE W REFLEX MICROSCOPIC
Bilirubin Urine: NEGATIVE
Glucose, UA: NEGATIVE mg/dL
Hgb urine dipstick: NEGATIVE
Ketones, ur: NEGATIVE mg/dL
Leukocytes,Ua: NEGATIVE
Nitrite: NEGATIVE
Protein, ur: NEGATIVE mg/dL
Specific Gravity, Urine: 1.021 (ref 1.005–1.030)
pH: 6 (ref 5.0–8.0)

## 2024-12-17 LAB — TYPE AND SCREEN
ABO/RH(D): A POS
Antibody Screen: NEGATIVE

## 2024-12-17 LAB — LIPASE, BLOOD: Lipase: 33 U/L (ref 11–51)

## 2024-12-17 MED ORDER — ONDANSETRON 4 MG PO TBDP: 4.0000 mg | ORAL_TABLET | Freq: Three times a day (TID) | ORAL | 0 refills | Status: AC | PRN

## 2024-12-17 MED ORDER — CYCLOBENZAPRINE HCL 5 MG PO TABS: 5.0000 mg | ORAL_TABLET | Freq: Three times a day (TID) | ORAL | 0 refills | Status: AC | PRN

## 2024-12-17 MED ORDER — ONDANSETRON 4 MG PO TBDP
4.0000 mg | ORAL_TABLET | Freq: Once | ORAL | Status: AC
  Administered 2024-12-17: 4 mg via ORAL
  Filled 2024-12-17: qty 1

## 2024-12-17 MED ORDER — HYDROCODONE-ACETAMINOPHEN 5-325 MG PO TABS
1.0000 | ORAL_TABLET | Freq: Once | ORAL | Status: AC
  Administered 2024-12-17: 1 via ORAL
  Filled 2024-12-17: qty 1

## 2024-12-17 MED ORDER — HYDROCODONE-ACETAMINOPHEN 5-325 MG PO TABS: 1.0000 | ORAL_TABLET | Freq: Three times a day (TID) | ORAL | 0 refills | Status: AC | PRN

## 2024-12-17 NOTE — ED Triage Notes (Signed)
 Pt states fell last week and has a broken tailbone.  Abd pain x 1 week with nausea. No vomiting no diarrhea. Today felt a rush of fluid and though she had peed herself went to the bathroom and had bright red blood in pants. Pt states yesterday had a bm and noticed some blood in stool but thought it was from being in pain due to tailbone injury. Pt states abd pain has been getting worse. Vaginal bleeding has slowed throughout the day but is still coming. PCP advised to come to ER for further evaluation.

## 2024-12-17 NOTE — ED Provider Notes (Cosign Needed)
 "   [facility] Emergency Department Provider Note     Event Date/Time   First MD Initiated Contact with Patient 12/17/24 1735     (approximate)   History   Abdominal Pain and Vaginal Bleeding   HPI  Melinda Shaw is a 59 y.o. female with a history of HTN, HLD, fibromyalgia, GERD, COPD, anxiety, and prior bilateral salpingo nephrectomy, presents to the ED for evaluation of ongoing pelvic pain and fullness.  Patient was evaluated in ED about a week ago for mechanical fall, resulting in a coccyx contusion.  She would endorse since that time she has had abdominal pain as well as intermittent nausea.  Today she had a rush of fluid from the vagina, thought she may have peed herself in the bathroom.  When she went she had bright red blood noted in her pants.  Patient yesterday would endorse a bowel movement, with some blood-tinged stool which again she thought was due to her acute tailbone injury.  She endorses ongoing pain and discomfort to the lower abdominal pelvic region.  By her report, the vaginal bleeding has slowed but has still noted some episodes.  Patient was under the impression that her pelvic fullness as well as intermittent bleeding was due to her acute tailbone trauma.  At time of my evaluation, the patient notes that she has not had any active or ongoing vaginal bleeding, rectal bleeding, or dysuria.  She denies any history of uterine prolapse, bladder prolapse, hemorrhoids, or rectal bleeding.  She presents to the ED for further evaluation.     Physical Exam   Triage Vital Signs: ED Triage Vitals  Encounter Vitals Group     BP 12/17/24 1538 (!) 164/90     Girls Systolic BP Percentile --      Girls Diastolic BP Percentile --      Boys Systolic BP Percentile --      Boys Diastolic BP Percentile --      Pulse Rate 12/17/24 1538 (!) 55     Resp 12/17/24 1538 18     Temp 12/17/24 1538 98.7 F (37.1 C)     Temp src --      SpO2 12/17/24 1538 100 %      Weight 12/17/24 1537 154 lb 15.7 oz (70.3 kg)     Height --      Head Circumference --      Peak Flow --      Pain Score 12/17/24 1537 9     Pain Loc --      Pain Education --      Exclude from Growth Chart --     Most recent vital signs: Vitals:   12/17/24 1538  BP: (!) 164/90  Pulse: (!) 55  Resp: 18  Temp: 98.7 F (37.1 C)  SpO2: 100%    General Awake, no distress. NAD HEENT NCAT. PERRL. EOMI. No rhinorrhea. Mucous membranes are moist.  CV:  Good peripheral perfusion. RRR RESP:  Normal effort.  CTA ABD:  No distention.  Rectal exam without evidence of external hemorrhoids or gross rectal bleeding.  DRE deferred. GYN:  Normal external genitalia on exam.  No evidence of any gross blood in the introitus.  Speculum exam reveals normal close cervical os without blood or discharge in the vaginal vault.   ED Results / Procedures / Treatments   Labs (all labs ordered are listed, but only abnormal results are displayed) Labs Reviewed  COMPREHENSIVE METABOLIC PANEL WITH GFR -  Abnormal; Notable for the following components:      Result Value   Potassium 3.3 (*)    Glucose, Bld 103 (*)    All other components within normal limits  URINALYSIS, ROUTINE W REFLEX MICROSCOPIC - Abnormal; Notable for the following components:   Color, Urine YELLOW (*)    APPearance HAZY (*)    All other components within normal limits  LIPASE, BLOOD  CBC  TYPE AND SCREEN   EKG   RADIOLOGY  I personally viewed and evaluated these images as part of my medical decision making, as well as reviewing the written report by the radiologist.  ED Provider Interpretation: No acute findings of the contrasted CT abdomen pelvis; no acute finding of CT lumbar spine  US  PELVIC COMPLETE WITH TRANSVAGINAL Result Date: 12/17/2024 EXAM: US  Pelvis, Complete Transvaginal and Transabdominal without Doppler TECHNIQUE: Transabdominal and transvaginal pelvic duplex ultrasound using B-mode/gray scaled imaging  without Doppler spectral analysis and color flow was obtained. COMPARISON: None available. CLINICAL HISTORY: Abnormal vaginal bleeding. Post-menopausal. Patient fell and started bleeding after trauma to tail bone. FINDINGS: UTERUS: The uterus is anteverted and measures 4.3 x 2.3 x 3.4 cm with a volume of 18.0 mL. Uterus demonstrates normal myometrial echotexture. ENDOMETRIUM: The endometrium measures 2 mm in thickness. RIGHT OVARY: The right ovary is surgically absent. LEFT OVARY: The left ovary is surgically absent. FREE FLUID: No free fluid is present. IMPRESSION: 1. Endometrium measures 2 mm. In the setting of vaginal bleeding, consider endometrial atrophy. Electronically signed by: Pinkie Pebbles MD 12/17/2024 08:17 PM EST RP Workstation: [id number]   CT ABDOMEN PELVIS WO CONTRAST Result Date: 12/17/2024 EXAM: CT ABDOMEN AND PELVIS WITHOUT CONTRAST 12/17/2024 04:05:42 PM TECHNIQUE: CT of the abdomen and pelvis was performed without the administration of intravenous contrast. Multiplanar reformatted images are provided for review. Automated exposure control, iterative reconstruction, and/or weight-based adjustment of the mA/kV was utilized to reduce the radiation dose to as low as reasonably achievable. COMPARISON: 05/21/2021 CLINICAL HISTORY: Abdominal pain, acute, nonlocalized. FINDINGS: LOWER CHEST: No acute abnormality. LIVER: The liver is unremarkable. GALLBLADDER AND BILE DUCTS: Cystectomy. No biliary ductal dilatation. SPLEEN: No acute abnormality. PANCREAS: No acute abnormality. ADRENAL GLANDS: No acute abnormality. KIDNEYS, URETERS AND BLADDER: Questionable 1 to 2 mm punctate nonobstructive right renal calculus. No stones in the ureters. No hydronephrosis. No perinephric or periureteral stranding. Urinary bladder is unremarkable. GI AND BOWEL: Stomach demonstrates no acute abnormality. Small transverse duodenal diverticulum. Postoperative findings along the right colon. Descending colon diverticula  without findings of inflammation. There is no bowel obstruction. PERITONEUM AND RETROPERITONEUM: No ascites. No free air. VASCULATURE: Aorta is normal in caliber. LYMPH NODES: No lymphadenopathy. REPRODUCTIVE ORGANS: No acute abnormality. BONES AND SOFT TISSUES: Lumbar degenerative disc disease with loss of disc height at all levels between L1 and L5. Minimal dextroconvex lumbar scoliosis. No acute osseous abnormality. No focal soft tissue abnormality. IMPRESSION: 1. No acute findings in the abdomen or pelvis. 2. Questionable 1-2 mm punctate nonobstructive right renal calculus. 3. Descending colon diverticulosis without diverticulitis. 4. Small transverse duodenal diverticulum. 5. Postoperative changes status post cystectomy and along the right colon. 6. Lumbar degenerative disc disease with loss of disc height at all levels from L1-2 through L4-5. 7. Minimal dextroconvex lumbar scoliosis. Electronically signed by: Ryan Salvage MD 12/17/2024 04:19 PM EST RP Workstation: [id number]   CT Lumbar Spine Wo Contrast Result Date: 12/17/2024 EXAM: CT OF THE LUMBAR SPINE WITHOUT CONTRAST 12/17/2024 04:05:42 PM TECHNIQUE: CT of the lumbar spine was performed without  the administration of intravenous contrast. Multiplanar reformatted images are provided for review. Automated exposure control, iterative reconstruction, and/or weight based adjustment of the mA/kV was utilized to reduce the radiation dose to as low as reasonably achievable. COMPARISON: None available. CLINICAL HISTORY: Low back pain, trauma. FINDINGS: BONES AND ALIGNMENT: Normal vertebral body heights. No acute fracture or suspicious bone lesion. There is straightening of the normal lumbar lordosis. Trace degenerative retrolisthesis of L3 on L3, L3 on L4, and L4 on L5. There is mild dextroscoliosis of the lumbar spine centered at L3-L4 with a component of rotary subluxation. Schmorl's nodes at multiple levels. Degenerative endplate osteophytes noted  throughout the lumbar spine. DEGENERATIVE CHANGES: There are disc bulges at multiple levels. No evidence of high grade osseous spinal canal stenosis. Prominent left extraforaminal component of disc bulge at L2-L3 which could potentially impact the left L2 nerve root. Facet arthrosis at multiple levels. There is mild right and moderate left foraminal stenosis at L4-L5. SOFT TISSUES: No acute abnormality. IMPRESSION: 1. No acute fracture. No evidence of traumatic malalignment. 2. Degenerative changes as above. 3. Prominent left extraforaminal component of disc bulge at L2-L3, which could potentially impact the left L2 nerve root. 4. Moderate left foraminal stenosis at L4-L5. Electronically signed by: Donnice Mania MD 12/17/2024 04:17 PM EST RP Workstation: [id number]   PROCEDURES:  Critical Care performed: No  Procedures   MEDICATIONS ORDERED IN ED: Medications  ondansetron  (ZOFRAN -ODT) disintegrating tablet 4 mg (4 mg Oral Given 12/17/24 1941)  HYDROcodone -acetaminophen  (NORCO/VICODIN) 5-325 MG per tablet 1 tablet (1 tablet Oral Given 12/17/24 1942)    IMPRESSION / MDM / ASSESSMENT AND PLAN / ED COURSE  I reviewed the triage vital signs and the nursing notes.                              Differential diagnosis includes, but is not limited to, acute appendicitis, diverticulitis, urinary tract infection/pyelonephritis, , bowel obstruction, colitis, renal colic, gastroenteritis, hernia, fibroids, spinal compression fracture, radiculopathy, etc.  Patient's presentation is most consistent with acute complicated illness / injury requiring diagnostic workup.  Patient's diagnosis is consistent with abnormal vaginal bleeding of an unclear etiology as well as ongoing coccygeal contusion pain.  Patient with reassuring exam and workup at this time including CT abdomen pelvis, which shows no acute process to explain her symptoms.  No evidence of rectal bladder prolapse on exam.  No UA evidence of hematuria or  bacteriuria.  Labs are reassuring without evidence of leukocytosis or critical anemia. CT lumbar spine without evidence of acute compression fracture or radiculopathy.  Patient's vaginal ultrasound also shows no abnormal findings of the intact uterus.  Patient will be discharged home with prescriptions for cyclobenzaprine  and hydrocodone  (#9). Patient is to follow up with her primary provider or GYN as suggested, as needed or otherwise directed. Patient is given ED precautions to return to the ED for any worsening or new symptoms.   FINAL CLINICAL IMPRESSION(S) / ED DIAGNOSES   Final diagnoses:  Abnormal vaginal bleeding  Coccygeal contusion, subsequent encounter     Rx / DC Orders   ED Discharge Orders          Ordered    HYDROcodone -acetaminophen  (NORCO/VICODIN) 5-325 MG tablet  3 times daily PRN        12/17/24 2037    ondansetron  (ZOFRAN -ODT) 4 MG disintegrating tablet  Every 8 hours PRN        12/17/24 2037  cyclobenzaprine  (FLEXERIL ) 5 MG tablet  3 times daily PRN        12/17/24 2037             Note:  This document was prepared using Dragon voice recognition software and may include unintentional dictation errors.    Loyd Candida LULLA Aldona, PA-C 12/17/24 2344  "

## 2024-12-17 NOTE — Telephone Encounter (Signed)
 FYI Only or Action Required?: FYI only for provider: ED advised.  Patient was last seen in primary care on 11/09/2024 by Donzella Lauraine SAILOR, DO.  Called Nurse Triage reporting Hematuria.  Symptoms began today.  Interventions attempted: Nothing.  Symptoms are: unchanged.  Triage Disposition: Go to ED Now (Notify PCP)  Patient/caregiver understands and will follow disposition?: Yes  Message from [facility] C sent at 12/17/2024  1:45 PM EST  Summary: fall / urinary concern   Reason for Triage: The patient has recently fallen due to ice and now developed urinary concerns that they are concerned may be related to their recent fall.         Reason for Disposition  Recent back or abdominal injury  Answer Assessment - Initial Assessment Questions Patient states she fell on ice 1/28. Patient fell hard, heard a pop. Patient tried soaking which did not help- she was seen at ED and after 8 hour wait- she was diagnosed with broken tailbone. Patient reports she saw blood in stool yesterday and today she felt dribble while standing in kitchen and passed blood with her urine. ED advised per protocol- patient reluctant to go due to long wait with last visit. Patient finally agreed to be seen at ED.    1. COLOR of URINE: Describe the color of the urine.  (e.g., tea-colored, pink, red, bloody) Do you have blood clots in your urine? (e.g., none, pea, grape, small coin)     Regular color- blood present 2. ONSET: When did the bleeding start?      today 3. EPISODES: How many times has there been blood in the urine? or How many times today?     Once- just started  7. OTHER SYMPTOMS: Do you have any other symptoms? (e.g., back/flank pain, abdomen pain, vomiting)     Patient saw blood with stool yesterday- small amount- was not so concerned because she had to strain for BM and thought that was the cause  Protocols used: Urine - Blood In-A-AH

## 2024-12-17 NOTE — Discharge Instructions (Addendum)
 Your exam, labs, CT scans, and pelvic ultrasound are all normal and reassuring.  No signs of UTI, or gross hematuria.  No CT evidence of any bladder or uterine prolapse.  No evidence of any colitis, rectal contusion, or tailbone fracture.  Your ultrasound shows normal-appearing uterus without abnormalities.  You should take the prescription meds as directed.  Follow-up with your primary provider or GYN provider as suggested.

## 2024-12-17 NOTE — ED Provider Triage Note (Signed)
 Emergency Medicine Provider Triage Evaluation Note  Melinda Shaw , a 59 y.o. female  was evaluated in triage.  Pt complains of bright red blood per rectum today.  She had fallen on the 28th and was evaluated been diagnosed with a coccyx fracture.  Yesterday she noticed some blood in her stool but thought it was due to the tailbone injury.  She is also now having abdominal pain that feels like has been getting worse. She feels the urge to push.  Bleeding has slowed throughout the day but primary care advised her to come to the emergency department for further evaluation.  Physical Exam  BP (!) 164/90   Pulse (!) 55   Temp 98.7 F (37.1 C)   Resp 18   Wt 70.3 kg   LMP 09/24/2016   SpO2 100%   BMI 30.27 kg/m  Gen:   Awake, no distress   Resp:  Normal effort  MSK:   Moves extremities without difficulty  Other:    Medical Decision Making  Medically screening exam initiated at 3:43 PM.  Appropriate orders placed.  Melinda Shaw was informed that the remainder of the evaluation will be completed by another provider, this initial triage assessment does not replace that evaluation, and the importance of remaining in the ED until their evaluation is complete.  CT abdomen and pelvis without contrast and lumbar imaging ordered in addition to labs.   Melinda Kirk NOVAK, FNP 12/17/24 [id number]

## 2024-12-22 ENCOUNTER — Ambulatory Visit: Admitting: Family Medicine

## 2024-12-27 ENCOUNTER — Ambulatory Visit: Admitting: Psychiatry

## 2025-02-17 ENCOUNTER — Ambulatory Visit: Admitting: Psychiatry

## 2025-02-24 ENCOUNTER — Ambulatory Visit: Admitting: Internal Medicine

## 2025-03-22 ENCOUNTER — Encounter: Admitting: Psychology

## 2025-05-10 ENCOUNTER — Encounter

## 2025-11-01 ENCOUNTER — Ambulatory Visit
